# Patient Record
Sex: Female | Born: 1935 | Race: Black or African American | Hispanic: No | Smoking: Never smoker
Health system: Southern US, Community
[De-identification: ages and names within clinical notes are randomized; demographics above are authoritative.]

## PROBLEM LIST (undated history)

## (undated) DIAGNOSIS — I10 Essential (primary) hypertension: Secondary | ICD-10-CM

## (undated) DIAGNOSIS — K219 Gastro-esophageal reflux disease without esophagitis: Secondary | ICD-10-CM

## (undated) DIAGNOSIS — N186 End stage renal disease: Secondary | ICD-10-CM

## (undated) DIAGNOSIS — E119 Type 2 diabetes mellitus without complications: Secondary | ICD-10-CM

## (undated) DIAGNOSIS — E039 Hypothyroidism, unspecified: Secondary | ICD-10-CM

## (undated) DIAGNOSIS — J449 Chronic obstructive pulmonary disease, unspecified: Secondary | ICD-10-CM

## (undated) DIAGNOSIS — K5900 Constipation, unspecified: Secondary | ICD-10-CM

## (undated) DIAGNOSIS — G8929 Other chronic pain: Secondary | ICD-10-CM

## (undated) DIAGNOSIS — Z992 Dependence on renal dialysis: Secondary | ICD-10-CM

## (undated) DIAGNOSIS — N200 Calculus of kidney: Secondary | ICD-10-CM

## (undated) DIAGNOSIS — I2699 Other pulmonary embolism without acute cor pulmonale: Secondary | ICD-10-CM

## (undated) DIAGNOSIS — J45909 Unspecified asthma, uncomplicated: Secondary | ICD-10-CM

## (undated) DIAGNOSIS — G629 Polyneuropathy, unspecified: Secondary | ICD-10-CM

## (undated) DIAGNOSIS — Z9289 Personal history of other medical treatment: Secondary | ICD-10-CM

## (undated) DIAGNOSIS — M545 Low back pain, unspecified: Secondary | ICD-10-CM

## (undated) DIAGNOSIS — D649 Anemia, unspecified: Secondary | ICD-10-CM

## (undated) DIAGNOSIS — E7800 Pure hypercholesterolemia, unspecified: Secondary | ICD-10-CM

## (undated) DIAGNOSIS — M109 Gout, unspecified: Secondary | ICD-10-CM

## (undated) DIAGNOSIS — M1990 Unspecified osteoarthritis, unspecified site: Secondary | ICD-10-CM

## (undated) DIAGNOSIS — C9000 Multiple myeloma not having achieved remission: Secondary | ICD-10-CM

## (undated) DIAGNOSIS — E1140 Type 2 diabetes mellitus with diabetic neuropathy, unspecified: Secondary | ICD-10-CM

## (undated) HISTORY — PX: ABDOMINOPLASTY: SUR9

## (undated) HISTORY — PX: COLONOSCOPY: SHX174

## (undated) HISTORY — PX: HERNIA REPAIR: SHX51

## (undated) HISTORY — PX: UMBILICAL HERNIA REPAIR: SUR1181

## (undated) HISTORY — PX: CHOLECYSTECTOMY: SHX55

---
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
## 1962-06-04 HISTORY — PX: TUBAL LIGATION: SHX77

## 1962-06-04 HISTORY — PX: ABDOMINAL HYSTERECTOMY: SHX81

## 1979-02-03 HISTORY — PX: CYSTOSCOPY W/ STONE MANIPULATION: SHX1427

## 1985-06-04 DIAGNOSIS — I2699 Other pulmonary embolism without acute cor pulmonale: Secondary | ICD-10-CM

## 1985-06-04 HISTORY — PX: LUNG REMOVAL, PARTIAL: SHX233

## 1985-06-04 HISTORY — DX: Other pulmonary embolism without acute cor pulmonale: I26.99

## 1997-11-22 ENCOUNTER — Ambulatory Visit (HOSPITAL_COMMUNITY): Admission: RE | Admit: 1997-11-22 | Discharge: 1997-11-22 | Payer: Self-pay | Admitting: Pulmonary Disease

## 1998-03-15 ENCOUNTER — Ambulatory Visit (HOSPITAL_COMMUNITY): Admission: RE | Admit: 1998-03-15 | Discharge: 1998-03-15 | Payer: Self-pay | Admitting: Cardiology

## 1998-12-23 ENCOUNTER — Encounter: Payer: Self-pay | Admitting: Emergency Medicine

## 1998-12-23 ENCOUNTER — Emergency Department (HOSPITAL_COMMUNITY): Admission: EM | Admit: 1998-12-23 | Discharge: 1998-12-23 | Payer: Self-pay | Admitting: Emergency Medicine

## 1999-06-14 ENCOUNTER — Encounter: Payer: Self-pay | Admitting: Pulmonary Disease

## 1999-06-14 ENCOUNTER — Ambulatory Visit (HOSPITAL_COMMUNITY): Admission: RE | Admit: 1999-06-14 | Discharge: 1999-06-14 | Payer: Self-pay | Admitting: Pulmonary Disease

## 1999-11-04 ENCOUNTER — Emergency Department (HOSPITAL_COMMUNITY): Admission: EM | Admit: 1999-11-04 | Discharge: 1999-11-04 | Payer: Self-pay | Admitting: Internal Medicine

## 1999-11-29 ENCOUNTER — Ambulatory Visit (HOSPITAL_COMMUNITY): Admission: RE | Admit: 1999-11-29 | Discharge: 1999-11-29 | Payer: Self-pay | Admitting: Pulmonary Disease

## 1999-11-29 ENCOUNTER — Encounter: Payer: Self-pay | Admitting: Pulmonary Disease

## 2000-10-08 ENCOUNTER — Encounter: Payer: Self-pay | Admitting: Pulmonary Disease

## 2000-10-08 ENCOUNTER — Encounter: Admission: RE | Admit: 2000-10-08 | Discharge: 2000-10-08 | Payer: Self-pay | Admitting: Pulmonary Disease

## 2001-02-10 ENCOUNTER — Encounter: Payer: Self-pay | Admitting: Orthopaedic Surgery

## 2001-02-10 ENCOUNTER — Encounter: Admission: RE | Admit: 2001-02-10 | Discharge: 2001-02-10 | Payer: Self-pay | Admitting: Orthopaedic Surgery

## 2001-06-10 ENCOUNTER — Emergency Department (HOSPITAL_COMMUNITY): Admission: EM | Admit: 2001-06-10 | Discharge: 2001-06-10 | Payer: Self-pay | Admitting: Emergency Medicine

## 2001-07-15 ENCOUNTER — Ambulatory Visit (HOSPITAL_BASED_OUTPATIENT_CLINIC_OR_DEPARTMENT_OTHER): Admission: RE | Admit: 2001-07-15 | Discharge: 2001-07-16 | Payer: Self-pay | Admitting: Orthopaedic Surgery

## 2001-08-05 ENCOUNTER — Encounter: Admission: RE | Admit: 2001-08-05 | Discharge: 2001-11-03 | Payer: Self-pay | Admitting: Orthopaedic Surgery

## 2001-09-02 ENCOUNTER — Ambulatory Visit (HOSPITAL_BASED_OUTPATIENT_CLINIC_OR_DEPARTMENT_OTHER): Admission: RE | Admit: 2001-09-02 | Discharge: 2001-09-02 | Payer: Self-pay | Admitting: Orthopaedic Surgery

## 2001-10-24 ENCOUNTER — Encounter: Payer: Self-pay | Admitting: Pulmonary Disease

## 2001-10-24 ENCOUNTER — Ambulatory Visit (HOSPITAL_COMMUNITY): Admission: RE | Admit: 2001-10-24 | Discharge: 2001-10-24 | Payer: Self-pay | Admitting: Pulmonary Disease

## 2001-12-12 ENCOUNTER — Encounter: Admission: RE | Admit: 2001-12-12 | Discharge: 2002-01-05 | Payer: Self-pay | Admitting: Orthopaedic Surgery

## 2002-02-19 ENCOUNTER — Encounter: Payer: Self-pay | Admitting: Pulmonary Disease

## 2002-02-19 ENCOUNTER — Ambulatory Visit (HOSPITAL_COMMUNITY): Admission: RE | Admit: 2002-02-19 | Discharge: 2002-02-19 | Payer: Self-pay | Admitting: Pulmonary Disease

## 2002-03-05 ENCOUNTER — Encounter: Payer: Self-pay | Admitting: Pulmonary Disease

## 2002-03-05 ENCOUNTER — Encounter: Admission: RE | Admit: 2002-03-05 | Discharge: 2002-03-05 | Payer: Self-pay | Admitting: Pulmonary Disease

## 2002-03-19 ENCOUNTER — Encounter: Admission: RE | Admit: 2002-03-19 | Discharge: 2002-03-19 | Payer: Self-pay | Admitting: Pulmonary Disease

## 2002-03-19 ENCOUNTER — Encounter: Payer: Self-pay | Admitting: Pulmonary Disease

## 2002-04-02 ENCOUNTER — Encounter: Payer: Self-pay | Admitting: Pulmonary Disease

## 2002-04-02 ENCOUNTER — Encounter: Admission: RE | Admit: 2002-04-02 | Discharge: 2002-04-02 | Payer: Self-pay | Admitting: Pulmonary Disease

## 2002-05-19 ENCOUNTER — Emergency Department (HOSPITAL_COMMUNITY): Admission: EM | Admit: 2002-05-19 | Discharge: 2002-05-19 | Payer: Self-pay

## 2002-06-01 ENCOUNTER — Emergency Department (HOSPITAL_COMMUNITY): Admission: EM | Admit: 2002-06-01 | Discharge: 2002-06-01 | Payer: Self-pay | Admitting: Emergency Medicine

## 2002-08-20 ENCOUNTER — Emergency Department (HOSPITAL_COMMUNITY): Admission: EM | Admit: 2002-08-20 | Discharge: 2002-08-20 | Payer: Self-pay | Admitting: Emergency Medicine

## 2003-03-18 ENCOUNTER — Inpatient Hospital Stay (HOSPITAL_COMMUNITY): Admission: EM | Admit: 2003-03-18 | Discharge: 2003-03-19 | Payer: Self-pay | Admitting: Cardiology

## 2003-03-18 ENCOUNTER — Encounter: Payer: Self-pay | Admitting: Emergency Medicine

## 2003-05-31 ENCOUNTER — Ambulatory Visit (HOSPITAL_COMMUNITY): Admission: RE | Admit: 2003-05-31 | Discharge: 2003-05-31 | Payer: Self-pay | Admitting: Pulmonary Disease

## 2003-08-24 ENCOUNTER — Ambulatory Visit (HOSPITAL_COMMUNITY): Admission: RE | Admit: 2003-08-24 | Discharge: 2003-08-24 | Payer: Self-pay | Admitting: *Deleted

## 2003-08-24 ENCOUNTER — Encounter (INDEPENDENT_AMBULATORY_CARE_PROVIDER_SITE_OTHER): Payer: Self-pay | Admitting: *Deleted

## 2003-09-07 ENCOUNTER — Ambulatory Visit (HOSPITAL_COMMUNITY): Admission: RE | Admit: 2003-09-07 | Discharge: 2003-09-07 | Payer: Self-pay | Admitting: *Deleted

## 2003-10-18 ENCOUNTER — Ambulatory Visit (HOSPITAL_COMMUNITY): Admission: RE | Admit: 2003-10-18 | Discharge: 2003-10-18 | Payer: Self-pay | Admitting: *Deleted

## 2003-10-18 ENCOUNTER — Encounter (INDEPENDENT_AMBULATORY_CARE_PROVIDER_SITE_OTHER): Payer: Self-pay | Admitting: Specialist

## 2004-01-22 ENCOUNTER — Encounter: Admission: RE | Admit: 2004-01-22 | Discharge: 2004-01-22 | Payer: Self-pay | Admitting: Orthopaedic Surgery

## 2004-03-09 ENCOUNTER — Inpatient Hospital Stay (HOSPITAL_COMMUNITY): Admission: RE | Admit: 2004-03-09 | Discharge: 2004-03-10 | Payer: Self-pay | Admitting: Neurosurgery

## 2004-04-05 ENCOUNTER — Encounter: Admission: RE | Admit: 2004-04-05 | Discharge: 2004-04-05 | Payer: Self-pay | Admitting: Neurosurgery

## 2004-07-20 ENCOUNTER — Emergency Department (HOSPITAL_COMMUNITY): Admission: EM | Admit: 2004-07-20 | Discharge: 2004-07-20 | Payer: Self-pay | Admitting: Emergency Medicine

## 2004-07-31 ENCOUNTER — Ambulatory Visit (HOSPITAL_COMMUNITY): Admission: RE | Admit: 2004-07-31 | Discharge: 2004-07-31 | Payer: Self-pay | Admitting: Neurosurgery

## 2004-08-11 ENCOUNTER — Encounter: Admission: RE | Admit: 2004-08-11 | Discharge: 2004-11-09 | Payer: Self-pay | Admitting: Neurosurgery

## 2004-11-10 ENCOUNTER — Ambulatory Visit (HOSPITAL_COMMUNITY): Admission: RE | Admit: 2004-11-10 | Discharge: 2004-11-10 | Payer: Self-pay | Admitting: Pulmonary Disease

## 2005-02-01 ENCOUNTER — Emergency Department (HOSPITAL_COMMUNITY): Admission: EM | Admit: 2005-02-01 | Discharge: 2005-02-01 | Payer: Self-pay | Admitting: Emergency Medicine

## 2005-04-16 ENCOUNTER — Ambulatory Visit (HOSPITAL_COMMUNITY): Admission: RE | Admit: 2005-04-16 | Discharge: 2005-04-16 | Payer: Self-pay | Admitting: Pulmonary Disease

## 2005-09-21 ENCOUNTER — Emergency Department (HOSPITAL_COMMUNITY): Admission: EM | Admit: 2005-09-21 | Discharge: 2005-09-21 | Payer: Self-pay | Admitting: Emergency Medicine

## 2005-10-03 ENCOUNTER — Encounter: Payer: Self-pay | Admitting: Cardiology

## 2006-03-13 ENCOUNTER — Encounter: Admission: RE | Admit: 2006-03-13 | Discharge: 2006-03-13 | Payer: Self-pay | Admitting: Orthopaedic Surgery

## 2006-03-28 ENCOUNTER — Ambulatory Visit (HOSPITAL_BASED_OUTPATIENT_CLINIC_OR_DEPARTMENT_OTHER): Admission: RE | Admit: 2006-03-28 | Discharge: 2006-03-29 | Payer: Self-pay | Admitting: Orthopaedic Surgery

## 2006-04-04 ENCOUNTER — Encounter: Admission: RE | Admit: 2006-04-04 | Discharge: 2006-05-01 | Payer: Self-pay | Admitting: Orthopaedic Surgery

## 2006-05-02 ENCOUNTER — Encounter: Admission: RE | Admit: 2006-05-02 | Discharge: 2006-07-31 | Payer: Self-pay | Admitting: Orthopaedic Surgery

## 2006-08-09 ENCOUNTER — Ambulatory Visit (HOSPITAL_COMMUNITY): Admission: RE | Admit: 2006-08-09 | Discharge: 2006-08-09 | Payer: Self-pay | Admitting: Pulmonary Disease

## 2006-12-18 ENCOUNTER — Encounter: Admission: RE | Admit: 2006-12-18 | Discharge: 2006-12-18 | Payer: Self-pay | Admitting: Orthopaedic Surgery

## 2007-01-01 ENCOUNTER — Encounter: Admission: RE | Admit: 2007-01-01 | Discharge: 2007-01-01 | Payer: Self-pay | Admitting: Orthopaedic Surgery

## 2007-06-30 ENCOUNTER — Ambulatory Visit (HOSPITAL_COMMUNITY): Admission: RE | Admit: 2007-06-30 | Discharge: 2007-06-30 | Payer: Self-pay | Admitting: Pulmonary Disease

## 2007-08-19 ENCOUNTER — Emergency Department (HOSPITAL_COMMUNITY): Admission: EM | Admit: 2007-08-19 | Discharge: 2007-08-19 | Payer: Self-pay | Admitting: Emergency Medicine

## 2008-03-22 ENCOUNTER — Emergency Department (HOSPITAL_COMMUNITY): Admission: EM | Admit: 2008-03-22 | Discharge: 2008-03-22 | Payer: Self-pay | Admitting: Emergency Medicine

## 2008-06-02 ENCOUNTER — Inpatient Hospital Stay (HOSPITAL_COMMUNITY): Admission: AD | Admit: 2008-06-02 | Discharge: 2008-06-02 | Payer: Self-pay | Admitting: Obstetrics & Gynecology

## 2009-06-04 HISTORY — PX: AV FISTULA PLACEMENT: SHX1204

## 2009-07-11 ENCOUNTER — Inpatient Hospital Stay (HOSPITAL_COMMUNITY): Admission: EM | Admit: 2009-07-11 | Discharge: 2009-07-23 | Payer: Self-pay | Admitting: Emergency Medicine

## 2009-07-11 ENCOUNTER — Emergency Department (HOSPITAL_COMMUNITY): Admission: EM | Admit: 2009-07-11 | Discharge: 2009-07-11 | Payer: Self-pay | Admitting: Family Medicine

## 2009-07-15 ENCOUNTER — Ambulatory Visit: Payer: Self-pay | Admitting: Vascular Surgery

## 2009-07-15 ENCOUNTER — Encounter: Payer: Self-pay | Admitting: Internal Medicine

## 2009-08-01 ENCOUNTER — Encounter (HOSPITAL_COMMUNITY): Admission: RE | Admit: 2009-08-01 | Discharge: 2009-10-30 | Payer: Self-pay | Admitting: Nephrology

## 2009-10-20 ENCOUNTER — Encounter: Admission: RE | Admit: 2009-10-20 | Discharge: 2009-10-20 | Payer: Self-pay | Admitting: Nephrology

## 2009-10-25 ENCOUNTER — Ambulatory Visit: Payer: Self-pay | Admitting: Vascular Surgery

## 2009-11-08 ENCOUNTER — Encounter (HOSPITAL_COMMUNITY): Admission: RE | Admit: 2009-11-08 | Discharge: 2010-02-06 | Payer: Self-pay | Admitting: Nephrology

## 2010-02-15 ENCOUNTER — Encounter (HOSPITAL_COMMUNITY)
Admission: RE | Admit: 2010-02-15 | Discharge: 2010-05-16 | Payer: Self-pay | Source: Home / Self Care | Attending: Nephrology | Admitting: Nephrology

## 2010-05-24 ENCOUNTER — Encounter (HOSPITAL_COMMUNITY)
Admission: RE | Admit: 2010-05-24 | Discharge: 2010-07-04 | Payer: Self-pay | Source: Home / Self Care | Attending: Nephrology | Admitting: Nephrology

## 2010-06-07 LAB — POCT HEMOGLOBIN-HEMACUE: Hemoglobin: 12.6 g/dL (ref 12.0–15.0)

## 2010-06-24 ENCOUNTER — Encounter: Payer: Self-pay | Admitting: Pulmonary Disease

## 2010-06-25 ENCOUNTER — Encounter: Payer: Self-pay | Admitting: Orthopaedic Surgery

## 2010-06-26 LAB — RENAL FUNCTION PANEL
Albumin: 3.4 g/dL — ABNORMAL LOW (ref 3.5–5.2)
BUN: 71 mg/dL — ABNORMAL HIGH (ref 6–23)
CO2: 25 mEq/L (ref 19–32)
GFR calc non Af Amer: 11 mL/min — ABNORMAL LOW (ref >60)
Phosphorus: 5.2 mg/dL — ABNORMAL HIGH (ref 2.3–4.6)

## 2010-06-26 LAB — POCT HEMOGLOBIN-HEMACUE: Hemoglobin: 11 g/dL — ABNORMAL LOW (ref 12.0–15.0)

## 2010-07-05 ENCOUNTER — Other Ambulatory Visit: Payer: Self-pay

## 2010-07-05 ENCOUNTER — Encounter (HOSPITAL_COMMUNITY): Payer: MEDICARE | Attending: Nephrology

## 2010-07-05 DIAGNOSIS — N186 End stage renal disease: Secondary | ICD-10-CM | POA: Insufficient documentation

## 2010-07-05 DIAGNOSIS — D649 Anemia, unspecified: Secondary | ICD-10-CM | POA: Insufficient documentation

## 2010-07-05 DIAGNOSIS — I120 Hypertensive chronic kidney disease with stage 5 chronic kidney disease or end stage renal disease: Secondary | ICD-10-CM | POA: Insufficient documentation

## 2010-07-19 ENCOUNTER — Other Ambulatory Visit: Payer: Self-pay

## 2010-07-19 ENCOUNTER — Encounter (HOSPITAL_COMMUNITY): Payer: MEDICARE

## 2010-07-19 ENCOUNTER — Other Ambulatory Visit: Payer: Self-pay | Admitting: Nephrology

## 2010-07-19 LAB — RENAL FUNCTION PANEL
Albumin: 3.5 g/dL (ref 3.5–5.2)
BUN: 76 mg/dL — ABNORMAL HIGH (ref 6–23)
CO2: 26 meq/L (ref 19–32)
Calcium: 8.9 mg/dL (ref 8.4–10.5)
Chloride: 106 meq/L (ref 96–112)
Creatinine, Ser: 3.47 mg/dL — ABNORMAL HIGH (ref 0.4–1.2)
GFR calc non Af Amer: 13 mL/min — ABNORMAL LOW
Glucose, Bld: 186 mg/dL — ABNORMAL HIGH (ref 70–99)
Phosphorus: 4.7 mg/dL — ABNORMAL HIGH (ref 2.3–4.6)
Potassium: 4.2 meq/L (ref 3.5–5.1)
Sodium: 141 meq/L (ref 135–145)

## 2010-07-21 LAB — POCT HEMOGLOBIN-HEMACUE: Hemoglobin: 11.6 g/dL — ABNORMAL LOW (ref 12.0–15.0)

## 2010-07-24 LAB — POCT HEMOGLOBIN-HEMACUE: Hemoglobin: 12.4 g/dL (ref 12.0–15.0)

## 2010-08-02 ENCOUNTER — Encounter (HOSPITAL_COMMUNITY): Payer: MEDICARE

## 2010-08-02 ENCOUNTER — Other Ambulatory Visit: Payer: Self-pay

## 2010-08-02 LAB — POCT HEMOGLOBIN-HEMACUE: Hemoglobin: 12.9 g/dL (ref 12.0–15.0)

## 2010-08-14 LAB — RENAL FUNCTION PANEL
Albumin: 3.6 g/dL (ref 3.5–5.2)
BUN: 77 mg/dL — ABNORMAL HIGH (ref 6–23)
Calcium: 9.4 mg/dL (ref 8.4–10.5)
Chloride: 104 mEq/L (ref 96–112)
Creatinine, Ser: 3.76 mg/dL — ABNORMAL HIGH (ref 0.4–1.2)
GFR calc Af Amer: 14 mL/min — ABNORMAL LOW (ref >60)

## 2010-08-14 LAB — POCT HEMOGLOBIN-HEMACUE: Hemoglobin: 11.4 g/dL — ABNORMAL LOW (ref 12.0–15.0)

## 2010-08-16 ENCOUNTER — Encounter (HOSPITAL_COMMUNITY): Payer: Medicare Other | Attending: Nephrology

## 2010-08-16 ENCOUNTER — Other Ambulatory Visit: Payer: Self-pay

## 2010-08-16 ENCOUNTER — Other Ambulatory Visit: Payer: Self-pay | Admitting: Nephrology

## 2010-08-16 DIAGNOSIS — I120 Hypertensive chronic kidney disease with stage 5 chronic kidney disease or end stage renal disease: Secondary | ICD-10-CM | POA: Insufficient documentation

## 2010-08-16 DIAGNOSIS — D649 Anemia, unspecified: Secondary | ICD-10-CM | POA: Insufficient documentation

## 2010-08-16 DIAGNOSIS — N186 End stage renal disease: Secondary | ICD-10-CM | POA: Insufficient documentation

## 2010-08-16 LAB — RENAL FUNCTION PANEL
Albumin: 3.5 g/dL (ref 3.5–5.2)
BUN: 67 mg/dL — ABNORMAL HIGH (ref 6–23)
BUN: 74 mg/dL — ABNORMAL HIGH (ref 6–23)
Calcium: 9.3 mg/dL (ref 8.4–10.5)
Chloride: 111 mEq/L (ref 96–112)
Glucose, Bld: 130 mg/dL — ABNORMAL HIGH (ref 70–99)
Glucose, Bld: 254 mg/dL — ABNORMAL HIGH (ref 70–99)
Phosphorus: 5.1 mg/dL — ABNORMAL HIGH (ref 2.3–4.6)
Potassium: 4.3 mEq/L (ref 3.5–5.1)
Sodium: 144 mEq/L (ref 135–145)

## 2010-08-16 LAB — POCT HEMOGLOBIN-HEMACUE: Hemoglobin: 11.8 g/dL — ABNORMAL LOW (ref 12.0–15.0)

## 2010-08-17 LAB — RENAL FUNCTION PANEL
Albumin: 3.6 g/dL (ref 3.5–5.2)
CO2: 27 mEq/L (ref 19–32)
CO2: 27 mEq/L (ref 19–32)
Calcium: 9.2 mg/dL (ref 8.4–10.5)
Calcium: 9 mg/dL (ref 8.4–10.5)
Chloride: 105 mEq/L (ref 96–112)
Creatinine, Ser: 3.71 mg/dL — ABNORMAL HIGH (ref 0.4–1.2)
GFR calc Af Amer: 15 mL/min — ABNORMAL LOW (ref >60)
GFR calc non Af Amer: 12 mL/min — ABNORMAL LOW (ref >60)
Glucose, Bld: 159 mg/dL — ABNORMAL HIGH (ref 70–99)
Phosphorus: 4.7 mg/dL — ABNORMAL HIGH (ref 2.3–4.6)
Sodium: 141 mEq/L (ref 135–145)
Sodium: 137 mEq/L (ref 135–145)

## 2010-08-17 LAB — POCT HEMOGLOBIN-HEMACUE: Hemoglobin: 12.3 g/dL (ref 12.0–15.0)

## 2010-08-18 LAB — COMPREHENSIVE METABOLIC PANEL
ALT: 17 U/L (ref 0–35)
AST: 29 U/L (ref 0–37)
Alkaline Phosphatase: 51 U/L (ref 39–117)
CO2: 23 mEq/L (ref 19–32)
Calcium: 9.1 mg/dL (ref 8.4–10.5)
Chloride: 106 mEq/L (ref 96–112)
GFR calc Af Amer: 14 mL/min — ABNORMAL LOW (ref >60)
GFR calc non Af Amer: 11 mL/min — ABNORMAL LOW (ref >60)
Glucose, Bld: 221 mg/dL — ABNORMAL HIGH (ref 70–99)
Potassium: 4.4 mEq/L (ref 3.5–5.1)
Sodium: 139 mEq/L (ref 135–145)
Total Bilirubin: 0.7 mg/dL (ref 0.3–1.2)

## 2010-08-18 LAB — RENAL FUNCTION PANEL
BUN: 71 mg/dL — ABNORMAL HIGH (ref 6–23)
CO2: 29 mEq/L (ref 19–32)
Chloride: 103 mEq/L (ref 96–112)
Glucose, Bld: 190 mg/dL — ABNORMAL HIGH (ref 70–99)
Phosphorus: 4.6 mg/dL (ref 2.3–4.6)
Potassium: 4.7 mEq/L (ref 3.5–5.1)
Sodium: 140 mEq/L (ref 135–145)

## 2010-08-18 LAB — URIC ACID: Uric Acid, Serum: 6 mg/dL (ref 2.4–7.0)

## 2010-08-18 LAB — POCT HEMOGLOBIN-HEMACUE: Hemoglobin: 11.2 g/dL — ABNORMAL LOW (ref 12.0–15.0)

## 2010-08-20 LAB — POCT HEMOGLOBIN-HEMACUE: Hemoglobin: 11.7 g/dL — ABNORMAL LOW (ref 12.0–15.0)

## 2010-08-21 LAB — RENAL FUNCTION PANEL
CO2: 18 mEq/L — ABNORMAL LOW (ref 19–32)
Calcium: 8.9 mg/dL (ref 8.4–10.5)
Chloride: 112 mEq/L (ref 96–112)
Creatinine, Ser: 4.05 mg/dL — ABNORMAL HIGH (ref 0.4–1.2)
GFR calc Af Amer: 13 mL/min — ABNORMAL LOW (ref >60)
GFR calc non Af Amer: 11 mL/min — ABNORMAL LOW (ref >60)
Glucose, Bld: 156 mg/dL — ABNORMAL HIGH (ref 70–99)
Sodium: 141 mEq/L (ref 135–145)

## 2010-08-21 LAB — CBC
Hemoglobin: 12.8 g/dL (ref 12.0–15.0)
MCHC: 33.7 g/dL (ref 30.0–36.0)
MCV: 95.2 fL (ref 78.0–100.0)
RBC: 4 MIL/uL (ref 3.87–5.11)
WBC: 6.3 10*3/uL (ref 4.0–10.5)

## 2010-08-22 LAB — RENAL FUNCTION PANEL
BUN: 74 mg/dL — ABNORMAL HIGH (ref 6–23)
CO2: 22 mEq/L (ref 19–32)
Glucose, Bld: 216 mg/dL — ABNORMAL HIGH (ref 70–99)
Potassium: 4.6 mEq/L (ref 3.5–5.1)
Sodium: 140 mEq/L (ref 135–145)

## 2010-08-22 LAB — POCT HEMOGLOBIN-HEMACUE: Hemoglobin: 12.6 g/dL (ref 12.0–15.0)

## 2010-08-22 LAB — CBC
HCT: 35.1 % — ABNORMAL LOW (ref 36.0–46.0)
Hemoglobin: 12 g/dL (ref 12.0–15.0)
RBC: 3.66 MIL/uL — ABNORMAL LOW (ref 3.87–5.11)

## 2010-08-23 LAB — POCT HEMOGLOBIN-HEMACUE: Hemoglobin: 10.7 g/dL — ABNORMAL LOW (ref 12.0–15.0)

## 2010-08-25 LAB — RENAL FUNCTION PANEL
Albumin: 3.1 g/dL — ABNORMAL LOW (ref 3.5–5.2)
Albumin: 3.2 g/dL — ABNORMAL LOW (ref 3.5–5.2)
Albumin: 3.3 g/dL — ABNORMAL LOW (ref 3.5–5.2)
Albumin: 3.3 g/dL — ABNORMAL LOW (ref 3.5–5.2)
Albumin: 3.3 g/dL — ABNORMAL LOW (ref 3.5–5.2)
Albumin: 3.4 g/dL — ABNORMAL LOW (ref 3.5–5.2)
Albumin: 3.5 g/dL (ref 3.5–5.2)
BUN: 103 mg/dL — ABNORMAL HIGH (ref 6–23)
BUN: 116 mg/dL — ABNORMAL HIGH (ref 6–23)
BUN: 54 mg/dL — ABNORMAL HIGH (ref 6–23)
BUN: 55 mg/dL — ABNORMAL HIGH (ref 6–23)
BUN: 58 mg/dL — ABNORMAL HIGH (ref 6–23)
BUN: 59 mg/dL — ABNORMAL HIGH (ref 6–23)
BUN: 62 mg/dL — ABNORMAL HIGH (ref 6–23)
BUN: 62 mg/dL — ABNORMAL HIGH (ref 6–23)
BUN: 69 mg/dL — ABNORMAL HIGH (ref 6–23)
BUN: 74 mg/dL — ABNORMAL HIGH (ref 6–23)
CO2: 20 mEq/L (ref 19–32)
CO2: 22 mEq/L (ref 19–32)
CO2: 24 mEq/L (ref 19–32)
CO2: 24 mEq/L (ref 19–32)
Calcium: 8.5 mg/dL (ref 8.4–10.5)
Calcium: 8.7 mg/dL (ref 8.4–10.5)
Calcium: 8.7 mg/dL (ref 8.4–10.5)
Calcium: 8.7 mg/dL (ref 8.4–10.5)
Calcium: 8.7 mg/dL (ref 8.4–10.5)
Calcium: 8.8 mg/dL (ref 8.4–10.5)
Calcium: 8.8 mg/dL (ref 8.4–10.5)
Chloride: 107 mEq/L (ref 96–112)
Chloride: 109 mEq/L (ref 96–112)
Chloride: 111 mEq/L (ref 96–112)
Chloride: 112 mEq/L (ref 96–112)
Chloride: 112 mEq/L (ref 96–112)
Chloride: 113 mEq/L — ABNORMAL HIGH (ref 96–112)
Creatinine, Ser: 3.96 mg/dL — ABNORMAL HIGH (ref 0.4–1.2)
Creatinine, Ser: 3.99 mg/dL — ABNORMAL HIGH (ref 0.4–1.2)
Creatinine, Ser: 4.04 mg/dL — ABNORMAL HIGH (ref 0.4–1.2)
Creatinine, Ser: 4.24 mg/dL — ABNORMAL HIGH (ref 0.4–1.2)
Creatinine, Ser: 4.33 mg/dL — ABNORMAL HIGH (ref 0.4–1.2)
Creatinine, Ser: 4.38 mg/dL — ABNORMAL HIGH (ref 0.4–1.2)
Creatinine, Ser: 4.93 mg/dL — ABNORMAL HIGH (ref 0.4–1.2)
GFR calc Af Amer: 12 mL/min — ABNORMAL LOW (ref >60)
GFR calc non Af Amer: 10 mL/min — ABNORMAL LOW (ref >60)
Glucose, Bld: 100 mg/dL — ABNORMAL HIGH (ref 70–99)
Glucose, Bld: 104 mg/dL — ABNORMAL HIGH (ref 70–99)
Glucose, Bld: 107 mg/dL — ABNORMAL HIGH (ref 70–99)
Glucose, Bld: 119 mg/dL — ABNORMAL HIGH (ref 70–99)
Glucose, Bld: 85 mg/dL (ref 70–99)
Glucose, Bld: 86 mg/dL (ref 70–99)
Glucose, Bld: 88 mg/dL (ref 70–99)
Glucose, Bld: 93 mg/dL (ref 70–99)
Phosphorus: 2.9 mg/dL (ref 2.3–4.6)
Phosphorus: 3 mg/dL (ref 2.3–4.6)
Phosphorus: 4.2 mg/dL (ref 2.3–4.6)
Phosphorus: 4.5 mg/dL (ref 2.3–4.6)
Phosphorus: 4.5 mg/dL (ref 2.3–4.6)
Potassium: 4.1 mEq/L (ref 3.5–5.1)
Potassium: 4.2 mEq/L (ref 3.5–5.1)
Potassium: 4.6 mEq/L (ref 3.5–5.1)
Potassium: 4.9 mEq/L (ref 3.5–5.1)
Sodium: 138 mEq/L (ref 135–145)
Sodium: 138 mEq/L (ref 135–145)
Sodium: 140 mEq/L (ref 135–145)
Sodium: 140 mEq/L (ref 135–145)

## 2010-08-25 LAB — CBC
HCT: 21.1 % — ABNORMAL LOW (ref 36.0–46.0)
HCT: 21.2 % — ABNORMAL LOW (ref 36.0–46.0)
HCT: 21.3 % — ABNORMAL LOW (ref 36.0–46.0)
HCT: 21.4 % — ABNORMAL LOW (ref 36.0–46.0)
HCT: 21.4 % — ABNORMAL LOW (ref 36.0–46.0)
HCT: 26.2 % — ABNORMAL LOW (ref 36.0–46.0)
HCT: 26.3 % — ABNORMAL LOW (ref 36.0–46.0)
Hemoglobin: 7 g/dL — ABNORMAL LOW (ref 12.0–15.0)
Hemoglobin: 7.1 g/dL — ABNORMAL LOW (ref 12.0–15.0)
Hemoglobin: 7.3 g/dL — ABNORMAL LOW (ref 12.0–15.0)
Hemoglobin: 7.4 g/dL — ABNORMAL LOW (ref 12.0–15.0)
Hemoglobin: 8.6 g/dL — ABNORMAL LOW (ref 12.0–15.0)
Hemoglobin: 9.1 g/dL — ABNORMAL LOW (ref 12.0–15.0)
MCHC: 33.5 g/dL (ref 30.0–36.0)
MCHC: 34.1 g/dL (ref 30.0–36.0)
MCHC: 34.3 g/dL (ref 30.0–36.0)
MCHC: 34.4 g/dL (ref 30.0–36.0)
MCHC: 34.4 g/dL (ref 30.0–36.0)
MCHC: 34.4 g/dL (ref 30.0–36.0)
MCHC: 34.6 g/dL (ref 30.0–36.0)
MCHC: 35.1 g/dL (ref 30.0–36.0)
MCV: 90.7 fL (ref 78.0–100.0)
MCV: 91.1 fL (ref 78.0–100.0)
MCV: 93.9 fL (ref 78.0–100.0)
MCV: 94.7 fL (ref 78.0–100.0)
MCV: 95.3 fL (ref 78.0–100.0)
MCV: 96.4 fL (ref 78.0–100.0)
MCV: 97.7 fL (ref 78.0–100.0)
MCV: 98.4 fL (ref 78.0–100.0)
Platelets: 202 10*3/uL (ref 150–400)
Platelets: 209 10*3/uL (ref 150–400)
Platelets: 221 10*3/uL (ref 150–400)
Platelets: 226 10*3/uL (ref 150–400)
Platelets: 243 10*3/uL (ref 150–400)
RBC: 2.07 MIL/uL — ABNORMAL LOW (ref 3.87–5.11)
RBC: 2.08 MIL/uL — ABNORMAL LOW (ref 3.87–5.11)
RBC: 2.16 MIL/uL — ABNORMAL LOW (ref 3.87–5.11)
RBC: 2.18 MIL/uL — ABNORMAL LOW (ref 3.87–5.11)
RBC: 2.24 MIL/uL — ABNORMAL LOW (ref 3.87–5.11)
RBC: 2.4 MIL/uL — ABNORMAL LOW (ref 3.87–5.11)
RBC: 2.68 MIL/uL — ABNORMAL LOW (ref 3.87–5.11)
RBC: 2.73 MIL/uL — ABNORMAL LOW (ref 3.87–5.11)
RBC: 2.88 MIL/uL — ABNORMAL LOW (ref 3.87–5.11)
RDW: 15.1 % (ref 11.5–15.5)
RDW: 15.1 % (ref 11.5–15.5)
RDW: 15.2 % (ref 11.5–15.5)
RDW: 15.8 % — ABNORMAL HIGH (ref 11.5–15.5)
RDW: 15.9 % — ABNORMAL HIGH (ref 11.5–15.5)
RDW: 16.1 % — ABNORMAL HIGH (ref 11.5–15.5)
RDW: 16.3 % — ABNORMAL HIGH (ref 11.5–15.5)
WBC: 4.7 10*3/uL (ref 4.0–10.5)
WBC: 5.2 10*3/uL (ref 4.0–10.5)
WBC: 5.3 10*3/uL (ref 4.0–10.5)
WBC: 5.7 10*3/uL (ref 4.0–10.5)
WBC: 5.8 10*3/uL (ref 4.0–10.5)

## 2010-08-25 LAB — CROSSMATCH
Antibody Screen: POSITIVE
DAT, IgG: NEGATIVE
PT AG Type: NEGATIVE

## 2010-08-25 LAB — URINALYSIS, ROUTINE W REFLEX MICROSCOPIC
Nitrite: NEGATIVE
Protein, ur: NEGATIVE mg/dL
Urobilinogen, UA: 0.2 mg/dL (ref 0.0–1.0)

## 2010-08-25 LAB — DIFFERENTIAL
Basophils Absolute: 0 10*3/uL (ref 0.0–0.1)
Basophils Absolute: 0 10*3/uL (ref 0.0–0.1)
Basophils Absolute: 0 10*3/uL (ref 0.0–0.1)
Basophils Absolute: 0 10*3/uL (ref 0.0–0.1)
Basophils Absolute: 0.1 10*3/uL (ref 0.0–0.1)
Basophils Relative: 0 % (ref 0–1)
Basophils Relative: 0 % (ref 0–1)
Basophils Relative: 0 % (ref 0–1)
Basophils Relative: 0 % (ref 0–1)
Basophils Relative: 1 % (ref 0–1)
Basophils Relative: 1 % (ref 0–1)
Basophils Relative: 1 % (ref 0–1)
Eosinophils Absolute: 0.1 10*3/uL (ref 0.0–0.7)
Eosinophils Absolute: 0.1 10*3/uL (ref 0.0–0.7)
Eosinophils Absolute: 0.1 10*3/uL (ref 0.0–0.7)
Eosinophils Absolute: 0.1 10*3/uL (ref 0.0–0.7)
Eosinophils Absolute: 0.1 10*3/uL (ref 0.0–0.7)
Eosinophils Absolute: 0.1 10*3/uL (ref 0.0–0.7)
Eosinophils Absolute: 0.1 10*3/uL (ref 0.0–0.7)
Eosinophils Absolute: 0.1 10*3/uL (ref 0.0–0.7)
Eosinophils Absolute: 0.2 10*3/uL (ref 0.0–0.7)
Eosinophils Absolute: 0.2 10*3/uL (ref 0.0–0.7)
Eosinophils Relative: 1 % (ref 0–5)
Eosinophils Relative: 2 % (ref 0–5)
Eosinophils Relative: 2 % (ref 0–5)
Eosinophils Relative: 2 % (ref 0–5)
Eosinophils Relative: 2 % (ref 0–5)
Eosinophils Relative: 2 % (ref 0–5)
Lymphocytes Relative: 24 % (ref 12–46)
Lymphocytes Relative: 26 % (ref 12–46)
Lymphocytes Relative: 28 % (ref 12–46)
Lymphocytes Relative: 28 % (ref 12–46)
Lymphocytes Relative: 30 % (ref 12–46)
Lymphocytes Relative: 31 % (ref 12–46)
Lymphs Abs: 1.5 10*3/uL (ref 0.7–4.0)
Lymphs Abs: 1.6 10*3/uL (ref 0.7–4.0)
Lymphs Abs: 1.6 10*3/uL (ref 0.7–4.0)
Lymphs Abs: 1.7 10*3/uL (ref 0.7–4.0)
Monocytes Absolute: 0.3 10*3/uL (ref 0.1–1.0)
Monocytes Absolute: 0.4 10*3/uL (ref 0.1–1.0)
Monocytes Absolute: 0.4 10*3/uL (ref 0.1–1.0)
Monocytes Absolute: 0.4 10*3/uL (ref 0.1–1.0)
Monocytes Absolute: 0.5 10*3/uL (ref 0.1–1.0)
Monocytes Absolute: 0.5 10*3/uL (ref 0.1–1.0)
Monocytes Relative: 5 % (ref 3–12)
Monocytes Relative: 7 % (ref 3–12)
Monocytes Relative: 7 % (ref 3–12)
Monocytes Relative: 8 % (ref 3–12)
Monocytes Relative: 8 % (ref 3–12)
Monocytes Relative: 8 % (ref 3–12)
Monocytes Relative: 8 % (ref 3–12)
Monocytes Relative: 8 % (ref 3–12)
Neutro Abs: 3.2 10*3/uL (ref 1.7–7.7)
Neutro Abs: 4.6 10*3/uL (ref 1.7–7.7)
Neutrophils Relative %: 57 % (ref 43–77)
Neutrophils Relative %: 58 % (ref 43–77)
Neutrophils Relative %: 61 % (ref 43–77)
Neutrophils Relative %: 61 % (ref 43–77)
Neutrophils Relative %: 62 % (ref 43–77)
Neutrophils Relative %: 66 % (ref 43–77)
Neutrophils Relative %: 67 % (ref 43–77)

## 2010-08-25 LAB — GLUCOSE, CAPILLARY
Glucose-Capillary: 100 mg/dL — ABNORMAL HIGH (ref 70–99)
Glucose-Capillary: 101 mg/dL — ABNORMAL HIGH (ref 70–99)
Glucose-Capillary: 103 mg/dL — ABNORMAL HIGH (ref 70–99)
Glucose-Capillary: 103 mg/dL — ABNORMAL HIGH (ref 70–99)
Glucose-Capillary: 108 mg/dL — ABNORMAL HIGH (ref 70–99)
Glucose-Capillary: 111 mg/dL — ABNORMAL HIGH (ref 70–99)
Glucose-Capillary: 112 mg/dL — ABNORMAL HIGH (ref 70–99)
Glucose-Capillary: 113 mg/dL — ABNORMAL HIGH (ref 70–99)
Glucose-Capillary: 113 mg/dL — ABNORMAL HIGH (ref 70–99)
Glucose-Capillary: 114 mg/dL — ABNORMAL HIGH (ref 70–99)
Glucose-Capillary: 121 mg/dL — ABNORMAL HIGH (ref 70–99)
Glucose-Capillary: 129 mg/dL — ABNORMAL HIGH (ref 70–99)
Glucose-Capillary: 131 mg/dL — ABNORMAL HIGH (ref 70–99)
Glucose-Capillary: 131 mg/dL — ABNORMAL HIGH (ref 70–99)
Glucose-Capillary: 137 mg/dL — ABNORMAL HIGH (ref 70–99)
Glucose-Capillary: 139 mg/dL — ABNORMAL HIGH (ref 70–99)
Glucose-Capillary: 142 mg/dL — ABNORMAL HIGH (ref 70–99)
Glucose-Capillary: 169 mg/dL — ABNORMAL HIGH (ref 70–99)
Glucose-Capillary: 189 mg/dL — ABNORMAL HIGH (ref 70–99)
Glucose-Capillary: 191 mg/dL — ABNORMAL HIGH (ref 70–99)
Glucose-Capillary: 204 mg/dL — ABNORMAL HIGH (ref 70–99)
Glucose-Capillary: 91 mg/dL (ref 70–99)
Glucose-Capillary: 92 mg/dL (ref 70–99)
Glucose-Capillary: 95 mg/dL (ref 70–99)

## 2010-08-25 LAB — COMPREHENSIVE METABOLIC PANEL
ALT: 41 U/L — ABNORMAL HIGH (ref 0–35)
Alkaline Phosphatase: 43 U/L (ref 39–117)
CO2: 22 mEq/L (ref 19–32)
Calcium: 8.9 mg/dL (ref 8.4–10.5)
GFR calc non Af Amer: 11 mL/min — ABNORMAL LOW (ref >60)
Glucose, Bld: 116 mg/dL — ABNORMAL HIGH (ref 70–99)
Potassium: 3.9 mEq/L (ref 3.5–5.1)
Sodium: 140 mEq/L (ref 135–145)

## 2010-08-25 LAB — HEMOGLOBIN A1C: Mean Plasma Glucose: 140 mg/dL

## 2010-08-25 LAB — HEPATITIS PANEL, ACUTE
Hep B C IgM: NEGATIVE
Hepatitis B Surface Ag: NEGATIVE

## 2010-08-25 LAB — POCT CARDIAC MARKERS
CKMB, poc: 5 ng/mL (ref 1.0–8.0)
Myoglobin, poc: >500 ng/mL (ref 12–200)
Troponin i, poc: <0.05 ng/mL (ref 0.00–0.09)
Troponin i, poc: <0.05 ng/mL (ref 0.00–0.09)

## 2010-08-25 LAB — LUPUS ANTICOAGULANT PANEL
DRVVT: 48.7 secs — ABNORMAL HIGH (ref 36.2–46.0)
Lupus Anticoagulant: NOT DETECTED
dRVVT Incubated 1:1 Mix: 41 secs (ref 36.1–47.0)

## 2010-08-25 LAB — BASIC METABOLIC PANEL
CO2: 24 mEq/L (ref 19–32)
Chloride: 105 mEq/L (ref 96–112)
GFR calc Af Amer: 10 mL/min — ABNORMAL LOW (ref >60)
Potassium: 4.3 mEq/L (ref 3.5–5.1)
Sodium: 138 mEq/L (ref 135–145)

## 2010-08-25 LAB — FOLATE RBC: RBC Folate: 1494 ng/mL — ABNORMAL HIGH (ref 180–600)

## 2010-08-25 LAB — LIPID PANEL
Total CHOL/HDL Ratio: 3.8 RATIO
VLDL: 23 mg/dL (ref 0–40)

## 2010-08-25 LAB — URINE CULTURE

## 2010-08-25 LAB — GLUCOSE 6 PHOSPHATE DEHYDROGENASE: G-6-PD, Quant: 11 U/g{Hb} (ref 7–20)

## 2010-08-28 LAB — CBC
HCT: 29.7 % — ABNORMAL LOW (ref 36.0–46.0)
MCV: 96.9 fL (ref 78.0–100.0)
RBC: 3.06 MIL/uL — ABNORMAL LOW (ref 3.87–5.11)
WBC: 7.2 10*3/uL (ref 4.0–10.5)

## 2010-08-28 LAB — RENAL FUNCTION PANEL
BUN: 81 mg/dL — ABNORMAL HIGH (ref 6–23)
CO2: 23 mEq/L (ref 19–32)
Chloride: 108 mEq/L (ref 96–112)
Creatinine, Ser: 3.76 mg/dL — ABNORMAL HIGH (ref 0.4–1.2)
GFR calc non Af Amer: 12 mL/min — ABNORMAL LOW (ref >60)

## 2010-08-30 ENCOUNTER — Encounter (HOSPITAL_COMMUNITY): Payer: Medicare Other

## 2010-08-30 ENCOUNTER — Other Ambulatory Visit: Payer: Self-pay | Admitting: Nephrology

## 2010-09-13 ENCOUNTER — Encounter (HOSPITAL_COMMUNITY): Payer: MEDICARE | Attending: Nephrology

## 2010-09-13 ENCOUNTER — Other Ambulatory Visit: Payer: Self-pay | Admitting: Nephrology

## 2010-09-13 DIAGNOSIS — I120 Hypertensive chronic kidney disease with stage 5 chronic kidney disease or end stage renal disease: Secondary | ICD-10-CM | POA: Insufficient documentation

## 2010-09-13 DIAGNOSIS — D649 Anemia, unspecified: Secondary | ICD-10-CM | POA: Insufficient documentation

## 2010-09-13 DIAGNOSIS — N186 End stage renal disease: Secondary | ICD-10-CM | POA: Insufficient documentation

## 2010-09-13 LAB — RENAL FUNCTION PANEL
Albumin: 3.8 g/dL (ref 3.5–5.2)
CO2: 23 mEq/L (ref 19–32)
Calcium: 9.2 mg/dL (ref 8.4–10.5)
Chloride: 109 mEq/L (ref 96–112)
GFR calc Af Amer: 16 mL/min — ABNORMAL LOW (ref >60)
GFR calc non Af Amer: 13 mL/min — ABNORMAL LOW (ref >60)
Sodium: 141 mEq/L (ref 135–145)

## 2010-09-18 ENCOUNTER — Emergency Department (HOSPITAL_COMMUNITY): Payer: MEDICARE

## 2010-09-18 ENCOUNTER — Emergency Department (HOSPITAL_COMMUNITY)
Admission: EM | Admit: 2010-09-18 | Discharge: 2010-09-18 | Disposition: A | Discharge status: Home / Self Care | Payer: MEDICARE | Attending: Emergency Medicine | Admitting: Emergency Medicine

## 2010-09-18 DIAGNOSIS — I120 Hypertensive chronic kidney disease with stage 5 chronic kidney disease or end stage renal disease: Secondary | ICD-10-CM | POA: Insufficient documentation

## 2010-09-18 DIAGNOSIS — N390 Urinary tract infection, site not specified: Secondary | ICD-10-CM | POA: Insufficient documentation

## 2010-09-18 DIAGNOSIS — R112 Nausea with vomiting, unspecified: Secondary | ICD-10-CM | POA: Insufficient documentation

## 2010-09-18 DIAGNOSIS — N185 Chronic kidney disease, stage 5: Secondary | ICD-10-CM | POA: Insufficient documentation

## 2010-09-18 DIAGNOSIS — E119 Type 2 diabetes mellitus without complications: Secondary | ICD-10-CM | POA: Insufficient documentation

## 2010-09-18 DIAGNOSIS — E7800 Pure hypercholesterolemia, unspecified: Secondary | ICD-10-CM | POA: Insufficient documentation

## 2010-09-18 LAB — URINALYSIS, ROUTINE W REFLEX MICROSCOPIC
Bilirubin Urine: NEGATIVE
Glucose, UA: NEGATIVE mg/dL
Ketones, ur: NEGATIVE mg/dL
Nitrite: NEGATIVE
Protein, ur: NEGATIVE mg/dL
Specific Gravity, Urine: 1.015 (ref 1.005–1.030)
Urobilinogen, UA: 0.2 mg/dL (ref 0.0–1.0)
pH: 5 (ref 5.0–8.0)

## 2010-09-18 LAB — COMPREHENSIVE METABOLIC PANEL
Albumin: 3.7 g/dL (ref 3.5–5.2)
BUN: 92 mg/dL — ABNORMAL HIGH (ref 6–23)
Calcium: 8.9 mg/dL (ref 8.4–10.5)
Creatinine, Ser: 3.87 mg/dL — ABNORMAL HIGH (ref 0.4–1.2)
Glucose, Bld: 91 mg/dL (ref 70–99)
Potassium: 4 mEq/L (ref 3.5–5.1)
Total Protein: 7.4 g/dL (ref 6.0–8.3)

## 2010-09-18 LAB — CBC
HCT: 33.2 % — ABNORMAL LOW (ref 36.0–46.0)
Hemoglobin: 11.4 g/dL — ABNORMAL LOW (ref 12.0–15.0)
MCH: 29.5 pg (ref 26.0–34.0)
MCHC: 34.3 g/dL (ref 30.0–36.0)
MCV: 85.8 fL (ref 78.0–100.0)
Platelets: 234 K/uL (ref 150–400)
RBC: 3.87 MIL/uL (ref 3.87–5.11)
RDW: 18.1 % — ABNORMAL HIGH (ref 11.5–15.5)
WBC: 7.1 K/uL (ref 4.0–10.5)

## 2010-09-18 LAB — POCT CARDIAC MARKERS
CKMB, poc: 2.2 ng/mL (ref 1.0–8.0)
CKMB, poc: 2 ng/mL (ref 1.0–8.0)
Myoglobin, poc: 245 ng/mL (ref 12–200)
Myoglobin, poc: 303 ng/mL (ref 12–200)
Troponin i, poc: <0.05 ng/mL (ref 0.00–0.09)
Troponin i, poc: <0.05 ng/mL (ref 0.00–0.09)

## 2010-09-18 LAB — GLUCOSE, CAPILLARY: Glucose-Capillary: 93 mg/dL (ref 70–99)

## 2010-09-18 LAB — URINE MICROSCOPIC-ADD ON

## 2010-09-18 LAB — DIFFERENTIAL
Basophils Absolute: 0 10*3/uL (ref 0.0–0.1)
Eosinophils Absolute: 0.1 10*3/uL (ref 0.0–0.7)
Lymphs Abs: 1.5 10*3/uL (ref 0.7–4.0)
Neutrophils Relative %: 71 % (ref 43–77)

## 2010-09-21 LAB — URINE CULTURE: Colony Count: >=100000

## 2010-09-27 ENCOUNTER — Encounter (HOSPITAL_COMMUNITY): Payer: MEDICARE

## 2010-09-27 ENCOUNTER — Other Ambulatory Visit: Payer: Self-pay | Admitting: Nephrology

## 2010-10-11 ENCOUNTER — Encounter (HOSPITAL_COMMUNITY): Payer: Medicare Other | Attending: Nephrology

## 2010-10-11 ENCOUNTER — Other Ambulatory Visit: Payer: Self-pay | Admitting: Nephrology

## 2010-10-11 DIAGNOSIS — D649 Anemia, unspecified: Secondary | ICD-10-CM | POA: Insufficient documentation

## 2010-10-11 DIAGNOSIS — I120 Hypertensive chronic kidney disease with stage 5 chronic kidney disease or end stage renal disease: Secondary | ICD-10-CM | POA: Insufficient documentation

## 2010-10-11 DIAGNOSIS — N186 End stage renal disease: Secondary | ICD-10-CM | POA: Insufficient documentation

## 2010-10-11 LAB — POCT HEMOGLOBIN-HEMACUE: Hemoglobin: 12.7 g/dL (ref 12.0–15.0)

## 2010-10-17 NOTE — Assessment & Plan Note (Signed)
OFFICE VISIT   KAILEENA, OBI  DOB:  Oct 22, 1932                                       10/25/2009  ZOXWR#:[id number]   The patient returns today for further evaluation for vascular access.  She has had a left forearm AV graft inserted by me on February 16 into  the brachial vein which was borderline.  This thrombosed 2 days later  and Dr. Arbie Cookey revised the inferior basilic vein which was also  borderline.  That occluded immediately and she now returns for  evaluation for further vascular access.  She is not yet on hemodialysis.   CHRONIC MEDICAL PROBLEMS:  1. Type 2 diabetes mellitus.  2. Hypertension.  3. Hyperlipidemia.  4. Chronic renal insufficiency.   SOCIAL HISTORY:  She is widowed, has 8 children and is retired.  Does  not use tobacco or alcohol.   REVIEW OF SYSTEMS:  Denies any chest pain.  Does have dyspnea on  exertion and difficulty with both lower extremities with ambulation,  arthritis, joint pain, muscle pain, depression, anxiety.  All other  systems in the review of systems are negative.   PHYSICAL EXAMINATION:  Blood pressure 160/69, heart rate 59, temperature  98.6. General:  She is a well developed, well nourished female in no  apparent distress.  She is alert and oriented x3.  HEENT:  Exam is  normal.  EOMs intact.  Lungs:  Clear.  No rhonchi or wheezing.  Cardiovascular:  Regular rhythm, no murmurs.  Abdomen:  Soft, nontender  with no masses.  Musculoskeletal exam:  Free of major deformities.  Upper extremity exam:  Left arm has an occluded left forearm graft with  palpable brachial and radial pulse at 3+.  Right upper extremity has  very small veins on physical exam with 3+ brachial and 2+ radial pulse.  Today I ordered vein mapping for basilic veins to see if she is a  candidate for basilic vein transposition.  The left side is thrombosed  as expected; the right side is very small and marginal.  I looked at it  with the  SonoSite and it is not adequate for basilic vein transposition.   This patient needs a left upper arm graft but is not yet on  hemodialysis.  We will wait until Dr. Leretha Dykes notifies our office when  she is getting closer to being on dialysis and at that time we will  insert a left upper arm graft.     Quita Skye Hart Rochester, M.D.  Electronically Signed   JDL/MEDQ  D:  10/25/2009  T:  10/26/2009  Job:  [id number]

## 2010-10-17 NOTE — Procedures (Signed)
CEPHALIC VEIN MAPPING   INDICATION:  Evaluate access for AV fistula placement.   HISTORY:   EXAM:  The right cephalic vein is not evaluated.   The right basilic vein is compressible.   Diameter measurements range from 0.16 to 0.36 cm.   The left cephalic vein is not evaluated.   The left basilic vein was not visualized.   See attached worksheet for all measurements.   IMPRESSION:  1. The patient's bilateral cephalic veins were not evaluated.  2. The left basilic vein is not visualized.  3. The right basilic vein is not of acceptable diameter for use as a      dialysis access site.  4. Incidental finding of thrombus noted in the left brachial vein.   ___________________________________________  Quita Skye. Hart Rochester, M.D.   CB/MEDQ  D:  10/26/2009  T:  10/26/2009  Job:  [id number]

## 2010-10-20 NOTE — Discharge Summary (Signed)
NAMEQUATISHA, ZYLKA                         ACCOUNT NO.:  [id number]   MEDICAL RECORD NO.:  [id number]                   PATIENT TYPE:  INP   LOCATION:  3707                                 FACILITY:  [facility]   PHYSICIAN:  Mohan N. Sharyn Lull, M.D.              DATE OF BIRTH:  23-Jan-1933   DATE OF ADMISSION:  03/18/2003  DATE OF DISCHARGE:  03/19/2003                                 DISCHARGE SUMMARY   ADMISSION DIAGNOSES:  1. Unstable angina, rule out myocardial infarction.  2. Hypertension.  3. Non-insulin-dependent diabetes mellitus.  4. Hypercholesterolemia.  5. Morbid obesity.  6. Degenerative joint disease.  7. History of pulmonary embolism.  8. History of deep venous thrombosis left leg.  9. Chronic anemia.   FINAL DIAGNOSES:  1. Mild coronary artery disease status post left catheterization.  2. Rule out esophageal spasm.  3. Rule out variant angina.  4. Rule out gastroesophageal reflux disease.  5. Hypertension.  6. Non-insulin-dependent diabetes mellitus.  7. Hypercholesterolemia.  8. Morbid obesity.  9. Degenerative joint disease.  10.      Chronic anemia, etiology unclear.  11.      History of pulmonary embolism in the past.  12.      History of deep venous thrombosis in the past.   DISCHARGE MEDICATIONS:  1. Baby aspirin 81 mg one tablet daily.  2. Nexium 40 mg one capsule daily half an hour before breakfast.  3. Cholesterol 10 mg one tablet daily.  4. Diovan 80 mg one tablet daily.  5. Metformin 500 mg b.i.d. which patient will start on March 21, 2003     (after two days).  6. Vicodin p.r.n. for back pain and shoulder pain.  7. Feosol 325 mg one tablet b.i.d.  8. Folic acid one tablet daily as before.   ACTIVITY:  Avoid heavy lifting, pushing or pulling for 48 hours.   DIET:  Low salt, low cholesterol, 1800 calorie ADA diet.   The patient has been advised to monitor blood sugar twice daily.  Post-  cardiac catheterization and activity instructions  have been given.  Follow  up with me in one week, and Dr. Petra Kuba as scheduled.   CONDITION ON DISCHARGE:  Stable.   BRIEF HISTORY AND HOSPITAL COURSE:  Ms. Guilbault is a 77 year old black  female with past medical history significant for hypertension, non-insulin-  dependent diabetes mellitus, hypercholesterolemia, degenerative joint  disease, history of bronchial asthma, morbid obesity, chronic anemia.  She  came to the ER at [facility] complaining of retrosternal and  precordial chest pain described as tightness, grade 8/10 without associated  symptoms.  She initially took Vicodin, thinking of musculoskeletal pain,  without relief so decided to come to the ER where she received three  sublingual nitroglycerin with relief of chest pain.  EKG done in the ER  showed normal sinus rhythm and sinus bradycardia with nonspecific T wave  changes  and minor T wave changes in the inferior leads.  The patient states  her chest pain lasted for more than one hours, denies any nausea, vomiting  or diaphoresis.  Denies paroxysmal nocturnal dyspnea, orthopnea, leg  swelling, denies shortness of breath.  Denies palpations, light headedness  or syncope.  The patient states lately she is under a lot of stress, gets  chest pain with walking and doing house work.  Denies any cardiac work up in  the past.   PAST MEDICAL HISTORY:  1. As above.  2. Also past history of pulmonary embolism and left leg deep venous     thrombosis in 1987.   PAST SURGICAL HISTORY:  She had left lung lobectomy in 1987 following  pulmonary embolism, in [place].  Status post breast reduction and  liposuction in 1981.  Had cholecystectomy in 1990.  Had left shoulder  surgery in 2003.   ALLERGIES:  PENICILLIN.   MEDICATIONS AT HOME:  1. Crestor 10 mg p.o. daily.  2. Metformin 500 mg p.o. b.i.d.  3. Diovan 80 mg p.o. daily.  4. Baby aspirin one p.o. daily.  5. Vicodin p.r.n.  6. Folic acid one tablet daily.    SOCIAL HISTORY:  She is divorced, has seven children.  Born in [place].  Moved from [place] in 1996.  She works as a Software engineer at [employer].  No  history of smoking or alcohol abuse.   FAMILY HISTORY:  Father died of complications of pneumonia and tuberculosis.  One brother has end-stage renal disease on hemodialysis.  One sister is  diabetic.   PHYSICAL EXAMINATION:  GENERAL:  She was alert and oriented x3, in no acute  distress.  VITAL SIGNS:  Blood pressure was 159/64, pulse was 49, sinus bradycardia on  the monitor.  EYES:  Conjunctivae pink.  NECK:  Supple, no JVD, no bruit.  LUNGS:  Clear to auscultation without rhonchi, rales.  CARDIOVASCULAR:  S1, S2 was normal, there was a soft systolic murmur, no S3  gallop or rub.  ABDOMEN:  Soft, bowel sounds were present, nontender.  EXTREMITIES:  No clubbing, cyanosis, there was 1+ left leg edema.   LABORATORY DATA:  Hemoglobin was 9.2, hematocrit 27.3, white count 6800.  Potassium was 4.8, blood sugar was 130.  Two sets of cardiac enzymes are  negative.  BUN 27, creatinine 1.4.  Troponin I was also negative.  Cholesterol was 112, LDL 50, HDL 39.   BRIEF HOSPITAL COURSE:  The patient was admitted to a telemetry unit.  Myocardial infarction was ruled out by serial enzymes and EKG.  The patient  was transferred to [facility].  The patient was started on IV  heparin and nitrates.  The patient did not have any further episodes of  chest pain during the hospital stay.  Discussed with patient and her son  regarding various options of treatment, i.e., noninvasive stress-testing  versus left catheterization, possible PTCA stenting, its risks i.e., that of  stroke, need for emergency CABG, the risk of restenosis, local vascular  complications, and consent for percutaneous coronary intervention.  The  patient underwent a selective left and right coronary angiography today without any complications.  The patient had mild coronary  artery disease  with  good LV systolic function.  The patient's groin is stable, there is no  evidence of hematoma or bruit.  The patient has been ambulating in the  hallway without any problems.  The patient will be discharged home on the  above medications and will be followed up in my office in one week.  The  patient will be scheduled to see Dr. Petra Kuba in two weeks.                                                 Eduardo Osier. Sharyn Lull, M.D.    MNH/MEDQ  D:  03/19/2003  T:  03/20/2003  Job:  [id number]   cc:   Eino Farber., M.D.  [address] [place]  [place] [address]  Fax: [phone]

## 2010-10-20 NOTE — Op Note (Signed)
. [facility]  Patient:    NICK, ARMEL Visit Number: [id number] MRN: [id number]          Service Type: DSU Location: [facility] Attending Physician:  Randolm Idol Dictated by:   Claude Manges. Cleophas Dunker, M.D. Proc. Date: 09/02/01 Admit Date:  07/15/2001 Discharge Date: 07/15/2001                             Operative Report  PREOPERATIVE DIAGNOSIS:  Adhesive capsulitis, left shoulder.  POSTOPERATIVE DIAGNOSIS:  Adhesive capsulitis, left shoulder.  OPERATION PERFORMED:  Manipulation of left shoulder.  SURGEON:  Claude Manges. Cleophas Dunker, M.D.  ANESTHESIA:  General mask.  COMPLICATIONS:  None.  INDICATIONS FOR PROCEDURE:  The patient is a 77 year old female seven weeks status post arthroscopic subacromial decompression and open distal clavicle resection, left shoulder.  Despite a course of physical therapy, she has developed adhesive capsulitis.  She is having some pain but considerable loss of motion.  She only probably had 90 degrees or so of active abduction and flexion and now is to have manipulation.  DESCRIPTION OF PROCEDURE:  With the patient comfortable on the operating room table and under general mask anesthetic, gentle manipulation of the left shoulder was performed.  There was obvious lysis of adhesions as I raised the arm fully overhead in the abduction and flexed position.  There was also considerable loss of external rotation.  Careful external rotation manipulation was performed.  Subsequently had full motion to the right upper extremity.  The shoulder was then prepped with Betadine and alcohol.  I injected 8 cc of 0.25% Marcaine without epinephrine and 40 mg of Depo-Medrol. A band-aid was applied.  The patient was then awakened and returned to the post anesthesia care unit.  PLAN:  Follow-up physical therapy, Vicodin for pain, office one week. Dictated by:   Claude Manges. Cleophas Dunker, M.D. Attending Physician:  Randolm Idol DD:  09/02/01 TD:  09/02/01 Job: [id number]

## 2010-10-20 NOTE — Op Note (Signed)
NAMEBRENAE, [name]                       ACCOUNT NO.:  [id number]   MEDICAL RECORD NO.:  [id number]                   PATIENT TYPE:  AMB   LOCATION:  ENDO                                 FACILITY:  [facility]   PHYSICIAN:  Georgiana Spinner, M.D.                 DATE OF BIRTH:  [birth year]   DATE OF PROCEDURE:  DATE OF DISCHARGE:                                 OPERATIVE REPORT   PROCEDURE:  Upper endoscopy.   INDICATIONS:  Followup of previous H. pylori ulcer.   ANESTHESIA:  Demerol 50 mg, Versed 6 mg.   DESCRIPTION OF PROCEDURE:  With the patient mildly sedated in the left  lateral decubitus position, the Olympus videoscopic endoscope was inserted  into the mouth and passed under direct vision through the esophagus which  appeared normal.  We entered the stomach and the fundus, body, antrum,  duodenal bulb, and second portion of the duodenum all appeared normal.  From  this point, the endoscope was slowly withdrawn, taking circumferential views  of the duodenal mucosa until the endoscope was pulled into the stomach and  placed in retroflexion, viewing the stomach from below.  The endoscope was  then straightened and withdrawn, taking circumferential views of the  remaining gastric and esophageal mucosa, stopping only to take biopsies and  the area in the antrum appeared somewhat prominent.  The patient's vital  signs and pulse oximetry remained stable and the patient tolerated the  procedure well without apparent complications.   FINDINGS:  Unremarkable examination.   PLAN:  Await biopsy report, the patient will call me for results and follow  up with me as an outpatient.                                               Georgiana Spinner, M.D.    GMO/MEDQ  D:  10/18/2003  T:  10/18/2003  Job:  [id number]   cc:   Eino Farber., M.D.  [address] [place]  [place] [address]  Fax: [phone]

## 2010-10-20 NOTE — Op Note (Signed)
[name], [name]                       ACCOUNT NO.:  [id number]   MEDICAL RECORD NO.:  [id number]                   PATIENT TYPE:  AMB   LOCATION:  ENDO                                 FACILITY:  [facility]   PHYSICIAN:  Georgiana Spinner, M.D.                 DATE OF BIRTH:  1933/05/02   DATE OF PROCEDURE:  DATE OF DISCHARGE:                                 OPERATIVE REPORT   PROCEDURE:  Upper endoscopy.   INDICATIONS:  Hemoccult positivity.   ANESTHESIA:  Demerol 75, Versed 5 mg.   DESCRIPTION OF PROCEDURE:  With the patient mildly sedated in the left  lateral decubitus position, the Olympus videoscopic endoscope was inserted  in the mouth, passed under direct vision through the esophagus which  appeared normal.  There was no evidence of Barrett's.  We entered into the  stomach.  Fundus and body appeared normal.  The antrum showed some changes  that may have been submucosal with corrugation and irregularity of one area  of the greater curvature.  This was photographed and biopsied.  We entered  in the duodenal bulb and second portion of the duodenum.  Both appeared  normal.  From this point, the endoscope was slowly withdrawn taking  circumferential views of the duodenal mucosal until the endoscope had been  pulled back into the stomach, placed in retroflexion to view the stomach  from below.  The endoscope was straightened and withdrawn taking  circumferential views of the remaining gastric and esophageal mucosa.  The  patient's vital signs and pulse oximeter remained stable.  The patient  tolerated the procedure well and without apparent complications.   FINDINGS:  Irregularity of the antrum possibly submucosal in nature, await  biopsy reports.  The patient will call me for results and follow up with me  as an outpatient.  Depending on the report, endoscopic ultrasound may be  necessary to evaluate this further.  Proceed to colonoscopy is planned.                       Georgiana Spinner, M.D.    GMO/MEDQ  D:  08/24/2003  T:  08/24/2003  Job:  [id number]

## 2010-10-20 NOTE — Op Note (Signed)
[name], HARTEL                       ACCOUNT NO.:  [id number]   MEDICAL RECORD NO.:  [id number]                   PATIENT TYPE:  AMB   LOCATION:  ENDO                                 FACILITY:  [facility]   PHYSICIAN:  Georgiana Spinner, M.D.                 DATE OF BIRTH:  Sep 20, 1932   DATE OF PROCEDURE:  08/24/2003  DATE OF DISCHARGE:                                 OPERATIVE REPORT   PROCEDURE:  Colonoscopy.   INDICATIONS FOR PROCEDURE:  Hemoccult positivity.   ANESTHESIA:  Demerol 50, Versed 3 mg.   PROCEDURE:  With the patient mildly sedated in the left lateral decubitus  position, the Olympus videoscopic colonoscope was inserted in the rectum and  passed under direct vision through a tortuous colon and a sigmoid colon that  is diverticula filled, until we reached the cecum identified by the  ileocecal valve and appendiceal orifice, both of which were photographed.  From this point, the colonoscope was slowly withdrawn taking circumferential  views of the entire colonic mucosa stopping only in the rectum which  appeared normal on direct and showed hemorrhoids on retroflex view.  The  endoscope was straightened and withdrawn.  The patient's vital signs and  pulse oximeter remained stable.  The patient tolerated the procedure well  without apparent complications.   FINDINGS:  Internal hemorrhoids, diverticulosis of the sigmoid colon,  otherwise, unremarkable examination.   PLAN:  See endoscopy note for further details.                                               Georgiana Spinner, M.D.    GMO/MEDQ  D:  08/24/2003  T:  08/24/2003  Job:  [id number]   cc:   Eino Farber., M.D.  [address] [place]  [place] [address]  Fax: [phone]

## 2010-10-20 NOTE — Cardiovascular Report (Signed)
[name], [name]                         ACCOUNT NO.:  [id number]   MEDICAL RECORD NO.:  [id number]                   PATIENT TYPE:  INP   LOCATION:  3707                                 FACILITY:  [facility]   PHYSICIAN:  Mohan N. Sharyn Lull, M.D.              DATE OF BIRTH:  14-Oct-1932   DATE OF PROCEDURE:  03/19/2003  DATE OF DISCHARGE:  03/19/2003                              CARDIAC CATHETERIZATION   PROCEDURE:  Left cardiac catheterization and selective left and right  coronary angiography, left ventriculography, via right groin using Judkins  technique.   INDICATION FOR PROCEDURE:  [name] is a 77 year old black female with  past medical history significant for hypertension, noninsulin-dependent  diabetes mellitus, hypercholesterolemia, chronic anemia, degenerative joint  disease, history of bronchial asthma, morbid obesity.  She came to the ER at  [facility] complaining of retrosternal and precordial chest pain  described as tightness and graded 8/10 without associated symptoms.  The  patient initially thought it was musculoskeletal pain and took Vicodin  without relief of chest pain so decided to come to ER.  The patient received  three sublingual nitroglycerin in the ER with relief of chest pain.  States  pain lasted for more than one hour.  Denies any nausea, vomiting,  diaphoresis, generalized PND, orthopnea, leg swelling.  Denies shortness of  breath.  Denies palpitations, lightheadedness, or syncope.  The patient  states lately she is under a lot of stress.  There is chest pain with  walking and doing housework.  Denies any cardiac workup in the past.  EKG  showed sinus bradycardia with nonspecific T-wave and minor T-wave inversion  in the inferior leads.  Discussed with the patient and her son regarding  various options of treatment, i.e., noninvasive stress test as her cardiac  enzymes are negative, versus left catheterization, possible PTCA, stenting,  and its risks, i.e., death, MI, stroke, need for emergency CABG, risk of  restenosis, local vascular complications, etc., and consented for PCI.   DESCRIPTION OF PROCEDURE:  After obtaining the informed consent, the patient  was brought to the catheterization lab and was placed on fluoroscopy table.  The right groin was prepped and draped in the usual fashion.  Xylocaine 2%  was used for local anesthesia in the right groin.  With the help of thin-  walled needle, 6 French arterial sheath was placed.  The sheath was  aspirated and flushed.  Next a 6 French left Judkins catheter was advanced  over the wire under fluoroscopic guidance into the ascending aorta.  The  wire was pulled out.  The catheter was aspirated and connected to the  manifold.  The catheter was further advanced and engaged into the left  coronary ostium.  Multiple views of the left system were taken.  Next the  catheter was disengaged and was pulled out over the wire and was replaced  with 6 [place] right  Judkins catheter, which was advanced over the wire under  fluoroscopic guidance up to the ascending aorta.  The wire was pulled out,  the catheter was aspirated and connected to the manifold.  The catheter was  further advanced and engaged into right coronary ostium.  Multiple views of  the right system were taken.  Next the catheter was disengaged and was  pulled out over the wire and was replaced with 6 French pigtail catheter,  which was advanced over the wire under fluoroscopic guidance up to the  ascending aorta.  The wire was pulled out.  The catheter was aspirated and  connected to the manifold.  The catheter was further advanced across the  aortic valve into the LV.  LV pressures were reported.  Next left  ventriculography was done in 30 degree RAO position.  Post-angiographic  pressures were recorded from LV and then pullback pressures were recorded  from the aorta.  There was no gradient across the aortic valve.   Next the  pigtail catheter was pulled out over the wire, sheaths were aspirated and  flushed.   FINDINGS:  1. LV showed good LV systolic function.  Mild LVH.  EF of 55-60%.  2. Left main was patent.  3. LAD has 10-15% midstenosis.  Distally the vessel tapers down at apex.     Diagonal 1 and 2 were very, very small.  Diagonal 3 was small, which was     patent.  4. Left circumflex was patent.  5. RCA was patent.   Arteriotomy was closed with Perclose without complications.  The patient  tolerated the procedure well.  The patient will be discharged home this  evening if hemodynamically stable.  Will do the anemia workup as outpatient  and will refer to GI if she continues to have recurrent chest pain.                                               Eduardo Osier. Sharyn Lull, M.D.    MNH/MEDQ  D:  03/19/2003  T:  03/22/2003  Job:  [id number]   cc:   [facility]   Eino Farber., M.D.  [address] [place]  [place] [address]  Fax: [phone]

## 2010-10-20 NOTE — Op Note (Signed)
[place]. [facility]  Patient:    ELIANAH, KARIS Visit Number: [id number] MRN: [id number]          Service Type: DSU Location: [facility] Attending Physician:  Randolm Idol Dictated by:   Claude Manges. Cleophas Dunker, M.D. Proc. Date: 07/15/01 Admit Date:  07/15/2001                             Operative Report  PREOPERATIVE DIAGNOSIS: 1. Impingement, left shoulder. 2. Degenerative joint disease, acromioclavicular joint.  POSTOPERATIVE DIAGNOSIS: 1. Impingement, left shoulder. 2. Degenerative joint disease, acromioclavicular joint.  PROCEDURE: 1. Arthroscopic subacromial decompression. 2. Open distal clavicle resection.  SURGEON:  Claude Manges. Cleophas Dunker, M.D.  ANESTHESIA:  General orotracheal.  COMPLICATIONS:  None.  INDICATIONS:  This is 77 year old female who has been experiencing left shoulder pain for months to the point of compromise.  She has clinical evidence of impingement and does not respond to cortisone injections or anti-inflammatory medicine.  She recently had an MRI scan that revealed partial peripheral surface tears of the supraspinatus tendon and marked hypertrophic spurring of the [facility] joint and acromion causing impingement.  She has not had an arthroscopic decompression.  DESCRIPTION OF PROCEDURE:  With the patient comfortable on the operating table and under general orotracheal anesthesia, the patient was placed in a semi-sitting position with the shoulder frame.  The left shoulder was then prepped with DuraPrep and the base of the neck circumferentially to below the elbow.  Sterile draping was performed.  A marking pen was used to outline the [facility] joint, the coracoid and the acromion at a point a fingerbreadth inferior and medial to the posterior angle of the acromion.  A small stab wound was made.  The arthroscope was then placed into the shoulder.  Diagnostic arthroscopy revealed areas of considerable chondromalacia of the humeral  head consistent with arthritis.  There were lesser areas of chondromalacia of the glenoid.  The anterior glenoid labrum was intact as was the biceps tendon.  I do not see an obvious rotator cuff tear.  The arthroscope was then placed in the subacromial space. A second and third portal were established in the mid lateral space and then anteriorly. Arthroscopic subacromial decompression was performed using the arthroscope and the shaver as well as the Arthrocare wand.  There was considerable subacromial bursitis and brisk bleeding from many different areas which made the procedure somewhat technically difficult.  I used a 6 mm, hooded bur to perform an anterior and lateral acromioplasty.  There was considerable spurring and impingement.  I carefully evaluated the rotator cuff on the bursal surface. There were some partial surface tears but I did not see any through-and-through tears and there was considerable bursitis which I resected with both the shaver as well as the Arthrocare wand.  I attempted to perform a distal clavicle resection through the scope.  Because of the bleeding, visualization was very poor and accordingly I elected to perform an open distal clavicle resection.  About 1-inch to 1-1/4 inch was performed longitudinally directly over the [facility] joint.  Very sharp dissection carried out through the subcutaneous tissue.  Capsule of the [facility] joint was identified and then incised along the length of skin incision.  Self-retaining retractor was inserted.  Subperiosteal dissection was performed and then the distal centimeter of clavicle was resected with a oscillating saw blade.  Bone wax was applied to bleeding bone surfaces.  With the finger I was able  to evaluate the joint.  I thought an excellent anterior lateral decompression and there was no further spurring beneath the clavicle.  I could visualize the rotator cuff and I also could not see any obvious tears.  The wound was  irrigated with saline solution.  The [facility] joint capsule was closed with interrupted 0 Vicryl, the subcutaneous tissue with 2-0 Vicryl. Skin closed with a running 4-0 Ethilon and 0.25% Marcaine with epinephrine injected.  _____ . Sterile bulky dressing was applied followed by a sling. The patient tolerated the procedure without complications.  Plan is recovery care overnight.  Office one week.  Percocet for pain. Dictated by:   Claude Manges. Cleophas Dunker, M.D. Attending Physician:  Randolm Idol DD:  07/15/01 TD:  07/15/01 Job: [id number]

## 2010-10-20 NOTE — Op Note (Signed)
[name], [name]             ACCOUNT NO.:  [id number]   MEDICAL RECORD NO.:  [id number]          PATIENT TYPE:  INP   LOCATION:  2892                         FACILITY:  [facility]   PHYSICIAN:  Reinaldo Meeker, M.D. DATE OF BIRTH:  02-Nov-1932   DATE OF PROCEDURE:  03/09/2004  DATE OF DISCHARGE:                                 OPERATIVE REPORT   PREOPERATIVE DIAGNOSIS:  Herniated disk at C4-5 with cervical myelopathy.   POSTOPERATIVE DIAGNOSIS:  Herniated disk at C4-5 with cervical myelopathy.   OPERATION PERFORMED:  C4-5 anterior cervical diskectomy with bone bank  fusion followed by Zephyr anterior cervical plating with operating  microscope   SURGEON:  Reinaldo Meeker, M.D.   ASSISTANT:  Tia Alert, MD   ANESTHESIA:  General.   DESCRIPTION OF PROCEDURE:  After being placed in the supine position in five  pounds halter traction, the patient's neck was prepped and draped in the  usual sterile fashion.  A localizing x-ray was taken prior to incision to  identify the appropriate level.  A transverse incision was made in the right  anterior neck starting at the midline and heading towards the medial aspect  of the sternocleidomastoid muscle.  The platysma muscle was then incised  transversely.  The natural fascial plane between the strap muscles medially  and sternocleidomastoid muscle laterally was identified, followed down to  the anterior aspect of the cervical spine.  Longus colli muscles were  identified and split in the midline and stripped away bilaterally with  Kitner resector.  A self-retaining retractor was placed for exposure.  X-ray  showed approach to the appropriate level.  Using a 15 blade, the annulus of  the disk was incised.  Using pituitary rongeurs and curettes, a thorough  disk space cleanout was carried out approximately 90% of the disk depth.  A  high speed drill was used to widen the interspace.  The microscope was  draped, brought into the field  and used for the remainder of the case.  Using microdissection technique, the remainder of the disk material down to  the posterior longitudinal ligament was removed.  The ligament was then  incised transversely.  A large amount of herniated disk material in the  midline was removed which gave excellent decompression of the spinal cord  dura.  Decompression was carried out bilaterally until the proximal nerve  roots could be identified bilaterally and the foramina were found to be  patent.  At this time inspection was carried out in all directions for any  evidence of residual compression.  None could be identified.  Large amounts  of irrigation were carried out and any bleeding controlled with bipolar  coagulation and Gelfoam.  Measurements were taken and 6 mm bone bank plug  reconstituted.  After irrigating once more and confirming hemostasis, the  plug was impacted without difficulty and fluoroscopy showed it to be in good  position.  An appropriate length Zephyr anterior cervical plate was then  chosen.  Under fluoroscopic guidance, drill holes were placed followed by  placement of 15 mm screws times four.  Final fluoroscopy  showed the plate,  screws and plug to all be in good position.  Large amounts of irrigation  were carried  out at this time and any bleeding controlled with bipolar coagulation.  The  wound was then closed using interrupted Vicryl in the muscle, inverted 5-0  PDS in the subcuticular layer and Steri-Strips on the skin.  A sterile  dressing and soft collar were applied and the patient was extubated and  taken to the recovery room in stable condition.       ROK/MEDQ  D:  03/09/2004  T:  03/09/2004  Job:  [id number]

## 2010-10-20 NOTE — Op Note (Signed)
[name], [name]             ACCOUNT NO.:  [id number]   MEDICAL RECORD NO.:  [id number]          PATIENT TYPE:  AMB   LOCATION:  DSC                          FACILITY:  [facility]   PHYSICIAN:  Claude Manges. Whitfield, M.D.DATE OF BIRTH:  [birth year]   DATE OF PROCEDURE:  03/28/2006  DATE OF DISCHARGE:                                 OPERATIVE REPORT   PREOPERATIVE DIAGNOSES:  1. Rotator cuff tear right shoulder with impingement.  2. Degenerative joint disease acromioclavicular joint.   POSTOPERATIVE DIAGNOSES:  1. Rotator cuff tear right shoulder with impingement.  2. Degenerative joint disease acromioclavicular joint.   PROCEDURE:  1. Arthroscopic debridement, right shoulder.  2. Arthroscopic subacromial decompression.  3. Arthroscopic distal clavicle resection.  4. Mini open rotator cuff tendon tear repair.   SURGEON:  Dr. Cleophas Dunker   ASSISTANT:  Arnoldo Morale, [facility]   ANESTHESIA:  General.   COMPLICATIONS:  None.   HISTORY:  A 77 year old female has had numerous episodes of recurrent  shoulder pain over the past year.  She has difficulty with overhead motion  and even to the point where she has had difficulty sleeping.  She has had  cortisone injection and anti-inflammatory medicines without much relief of  her pain, so an MRI scan was performed, revealing a small full-thickness  tear of the distal supraspinatus.  She had stable bicipital tendinosus  without evidence of labral tear and some degenerative joints at the [facility] joint  with anterior sloping acromion.  She wishes to proceed with surgical  correction.   PROCEDURE:  With the patient comfortable on the operating table and under  general orotracheal anesthesia, the patient was placed in the semi-sitting  position with a shoulder frame.  Examination of the shoulder revealed no  evidence of adhesive capsulitis or instability.   The shoulder was then prepped from the base of the neck circumferentially  below the elbow with  DuraPrep.  Sterile draping was performed.   A marking pen was used to outline the [facility] joint, the coracoid, and the  acromion.  At a point a fingerbreadth posterior and medial to the post angle  acromion, a small stab wound was made.  The arthroscope was then easily  placed into the shoulder joint.  Diagnostic arthroscopy revealed very  minimal synovitis.  The biceps was intact.  There was an obvious tear of the  rotator cuff, as I could visualize the subacromial space.  I did not see any  appreciable chondromalacia of the humeral head or the glenoid.  There were  no loose bodies.  Debridement of some of the synovitis was performed through  a second portal established anteriorly using the ArthroCare wand.   The arthroscope was placed in the subacromial space posteriorly, the cannula  in the subacromial space anteriorly, and a third portal established in a  lateral subacromial space.  An arthroscopic subacromial decompression was  performed, removing considerable bursal tissue.  There was obvious overhang  of the acromion and the distal clavicle.  Accordingly, a 6 mm bur was used  to perform an anterior-inferior acromioplasty with a nice decompression.  I  also performed a distal clavicle resection with removing all spurs in any  areas of impingement.  The cuff tear was also visualized from the bursal  surface.   A mini open rotator cuff tear repair was then performed.  About a 1.5 inch  incision was made over the anterior aspect of the shoulder via sharp  dissection and carried down to the subcutaneous tissue.  Gross bleeders were  Bovie coagulated.  Self-retaining retractor was inserted.  Deltoid fascia  was identified and along its fibers, a Bovie was used to dissect.  Then via  blunt dissection, the interval was opened so I could visualize the  subacromial space.  Biceps tendon was intact as was noted by arthroscopy.  The supraspinatus was torn in an L fashion along the humeral head  from  medial to lateral and then longitudinally and superiorly along the biceps  tendon.  The edges were identified; they were manually freed so that I could  now juxtapose them, and they were sutured with interrupted 0 Ethibond suture  with a very nice closure and no evidence of tension or impingement.  The  joint and subacromial space was then copiously irrigated with saline  solution.  The deltoid fascia closed with running 0 Vicryl, the subcu with 2-  0 Vicryl, the skin closed with skin clips.  Marcaine with epinephrine was  injected into the wound edges.  Sterile bulky dressing was applied followed  by a sling.   PLAN:  Recovery care.  Percocet for pain.  Office 1 week.      Claude Manges. Cleophas Dunker, M.D.  Electronically Signed     PWW/MEDQ  D:  03/28/2006  T:  03/29/2006  Job:  [id number]

## 2010-10-25 ENCOUNTER — Other Ambulatory Visit: Payer: Self-pay | Admitting: Nephrology

## 2010-10-25 ENCOUNTER — Encounter (HOSPITAL_COMMUNITY): Payer: Medicare Other

## 2010-10-25 LAB — RENAL FUNCTION PANEL
Albumin: 3.4 g/dL — ABNORMAL LOW (ref 3.5–5.2)
BUN: 73 mg/dL — ABNORMAL HIGH (ref 6–23)
Chloride: 98 mEq/L (ref 96–112)
Phosphorus: 3.4 mg/dL (ref 2.3–4.6)
Potassium: 4.3 mEq/L (ref 3.5–5.1)
Sodium: 135 mEq/L (ref 135–145)

## 2010-10-25 LAB — POCT HEMOGLOBIN-HEMACUE: Hemoglobin: 12.1 g/dL (ref 12.0–15.0)

## 2010-11-08 ENCOUNTER — Other Ambulatory Visit: Payer: Self-pay | Admitting: Nephrology

## 2010-11-08 ENCOUNTER — Encounter (HOSPITAL_COMMUNITY): Payer: Medicare Other | Attending: Nephrology

## 2010-11-08 DIAGNOSIS — I120 Hypertensive chronic kidney disease with stage 5 chronic kidney disease or end stage renal disease: Secondary | ICD-10-CM | POA: Insufficient documentation

## 2010-11-08 DIAGNOSIS — N186 End stage renal disease: Secondary | ICD-10-CM | POA: Insufficient documentation

## 2010-11-08 DIAGNOSIS — D649 Anemia, unspecified: Secondary | ICD-10-CM | POA: Insufficient documentation

## 2010-11-08 LAB — POCT HEMOGLOBIN-HEMACUE: Hemoglobin: 12.3 g/dL (ref 12.0–15.0)

## 2010-11-22 ENCOUNTER — Other Ambulatory Visit: Payer: Self-pay | Admitting: Nephrology

## 2010-11-22 ENCOUNTER — Encounter (HOSPITAL_COMMUNITY): Payer: Medicare Other

## 2010-11-22 LAB — RENAL FUNCTION PANEL
BUN: 78 mg/dL — ABNORMAL HIGH (ref 6–23)
CO2: 24 mEq/L (ref 19–32)
Calcium: 9 mg/dL (ref 8.4–10.5)
Glucose, Bld: 144 mg/dL — ABNORMAL HIGH (ref 70–99)
Phosphorus: 4.1 mg/dL (ref 2.3–4.6)

## 2010-11-22 LAB — POCT HEMOGLOBIN-HEMACUE: Hemoglobin: 10.1 g/dL — ABNORMAL LOW (ref 12.0–15.0)

## 2010-11-23 LAB — POCT HEMOGLOBIN-HEMACUE: Hemoglobin: 10 g/dL — ABNORMAL LOW (ref 12.0–15.0)

## 2010-12-07 ENCOUNTER — Other Ambulatory Visit: Payer: Self-pay | Admitting: Nephrology

## 2010-12-07 ENCOUNTER — Encounter (HOSPITAL_COMMUNITY): Payer: Medicare Other | Attending: Nephrology

## 2010-12-07 DIAGNOSIS — D649 Anemia, unspecified: Secondary | ICD-10-CM | POA: Insufficient documentation

## 2010-12-07 DIAGNOSIS — N186 End stage renal disease: Secondary | ICD-10-CM | POA: Insufficient documentation

## 2010-12-07 DIAGNOSIS — I120 Hypertensive chronic kidney disease with stage 5 chronic kidney disease or end stage renal disease: Secondary | ICD-10-CM | POA: Insufficient documentation

## 2010-12-07 LAB — POCT HEMOGLOBIN-HEMACUE: Hemoglobin: 10.9 g/dL — ABNORMAL LOW (ref 12.0–15.0)

## 2010-12-21 ENCOUNTER — Other Ambulatory Visit: Payer: Self-pay | Admitting: Nephrology

## 2010-12-21 ENCOUNTER — Encounter (HOSPITAL_COMMUNITY): Payer: Medicare Other

## 2010-12-21 LAB — POCT HEMOGLOBIN-HEMACUE: Hemoglobin: 11.3 g/dL — ABNORMAL LOW (ref 12.0–15.0)

## 2011-01-04 ENCOUNTER — Other Ambulatory Visit: Payer: Self-pay | Admitting: Nephrology

## 2011-01-04 ENCOUNTER — Encounter (HOSPITAL_COMMUNITY): Payer: Medicare Other | Attending: Nephrology

## 2011-01-04 DIAGNOSIS — N186 End stage renal disease: Secondary | ICD-10-CM | POA: Insufficient documentation

## 2011-01-04 DIAGNOSIS — I120 Hypertensive chronic kidney disease with stage 5 chronic kidney disease or end stage renal disease: Secondary | ICD-10-CM | POA: Insufficient documentation

## 2011-01-04 DIAGNOSIS — D649 Anemia, unspecified: Secondary | ICD-10-CM | POA: Insufficient documentation

## 2011-01-04 LAB — POCT HEMOGLOBIN-HEMACUE: Hemoglobin: 12.1 g/dL (ref 12.0–15.0)

## 2011-01-18 ENCOUNTER — Other Ambulatory Visit: Payer: Self-pay | Admitting: Nephrology

## 2011-01-18 ENCOUNTER — Encounter (HOSPITAL_COMMUNITY): Payer: Medicare Other

## 2011-02-01 ENCOUNTER — Other Ambulatory Visit: Payer: Self-pay | Admitting: Nephrology

## 2011-02-01 ENCOUNTER — Encounter (HOSPITAL_COMMUNITY): Payer: Medicare Other

## 2011-02-15 ENCOUNTER — Other Ambulatory Visit: Payer: Self-pay | Admitting: Nephrology

## 2011-02-15 ENCOUNTER — Encounter (HOSPITAL_COMMUNITY): Payer: Medicare Other | Attending: Nephrology

## 2011-02-15 DIAGNOSIS — D649 Anemia, unspecified: Secondary | ICD-10-CM | POA: Insufficient documentation

## 2011-02-15 DIAGNOSIS — N186 End stage renal disease: Secondary | ICD-10-CM | POA: Insufficient documentation

## 2011-02-15 DIAGNOSIS — I120 Hypertensive chronic kidney disease with stage 5 chronic kidney disease or end stage renal disease: Secondary | ICD-10-CM | POA: Insufficient documentation

## 2011-02-15 LAB — POCT HEMOGLOBIN-HEMACUE: Hemoglobin: 11.8 g/dL — ABNORMAL LOW (ref 12.0–15.0)

## 2011-02-23 ENCOUNTER — Emergency Department (HOSPITAL_COMMUNITY)
Admission: EM | Admit: 2011-02-23 | Discharge: 2011-02-24 | Disposition: A | Discharge status: Home / Self Care | Payer: Medicare Other | Attending: Emergency Medicine | Admitting: Emergency Medicine

## 2011-02-23 DIAGNOSIS — Z86711 Personal history of pulmonary embolism: Secondary | ICD-10-CM | POA: Insufficient documentation

## 2011-02-23 DIAGNOSIS — E7800 Pure hypercholesterolemia, unspecified: Secondary | ICD-10-CM | POA: Insufficient documentation

## 2011-02-23 DIAGNOSIS — Z79899 Other long term (current) drug therapy: Secondary | ICD-10-CM | POA: Insufficient documentation

## 2011-02-23 DIAGNOSIS — R110 Nausea: Secondary | ICD-10-CM | POA: Insufficient documentation

## 2011-02-23 DIAGNOSIS — K219 Gastro-esophageal reflux disease without esophagitis: Secondary | ICD-10-CM | POA: Insufficient documentation

## 2011-02-23 DIAGNOSIS — N390 Urinary tract infection, site not specified: Secondary | ICD-10-CM | POA: Insufficient documentation

## 2011-02-23 DIAGNOSIS — I10 Essential (primary) hypertension: Secondary | ICD-10-CM | POA: Insufficient documentation

## 2011-02-23 DIAGNOSIS — E119 Type 2 diabetes mellitus without complications: Secondary | ICD-10-CM | POA: Insufficient documentation

## 2011-02-23 LAB — URINALYSIS, ROUTINE W REFLEX MICROSCOPIC
Glucose, UA: NEGATIVE mg/dL
Ketones, ur: NEGATIVE mg/dL
Protein, ur: NEGATIVE mg/dL
Urobilinogen, UA: 0.2 mg/dL (ref 0.0–1.0)

## 2011-02-23 LAB — DIFFERENTIAL
Eosinophils Relative: 1 % (ref 0–5)
Lymphocytes Relative: 24 % (ref 12–46)
Lymphs Abs: 1.5 10*3/uL (ref 0.7–4.0)
Monocytes Relative: 6 % (ref 3–12)

## 2011-02-23 LAB — COMPREHENSIVE METABOLIC PANEL
ALT: 26 U/L (ref 0–35)
AST: 56 U/L — ABNORMAL HIGH (ref 0–37)
Alkaline Phosphatase: 71 U/L (ref 39–117)
CO2: 30 mEq/L (ref 19–32)
Chloride: 98 mEq/L (ref 96–112)
GFR calc Af Amer: 13 mL/min — ABNORMAL LOW (ref >60)
GFR calc non Af Amer: 11 mL/min — ABNORMAL LOW (ref >60)
Glucose, Bld: 142 mg/dL — ABNORMAL HIGH (ref 70–99)
Potassium: 4.3 mEq/L (ref 3.5–5.1)
Sodium: 141 mEq/L (ref 135–145)
Total Bilirubin: 0.3 mg/dL (ref 0.3–1.2)

## 2011-02-23 LAB — CBC
HCT: 39.6 % (ref 36.0–46.0)
MCH: 29.3 pg (ref 26.0–34.0)
MCV: 86.1 fL (ref 78.0–100.0)
RBC: 4.6 MIL/uL (ref 3.87–5.11)
RDW: 17.7 % — ABNORMAL HIGH (ref 11.5–15.5)
WBC: 6.3 10*3/uL (ref 4.0–10.5)

## 2011-02-23 LAB — URINE MICROSCOPIC-ADD ON

## 2011-02-24 LAB — CK TOTAL AND CKMB (NOT AT ARMC): Relative Index: 1 (ref 0.0–2.5)

## 2011-02-27 ENCOUNTER — Other Ambulatory Visit: Payer: Self-pay | Admitting: Internal Medicine

## 2011-02-27 ENCOUNTER — Encounter (HOSPITAL_BASED_OUTPATIENT_CLINIC_OR_DEPARTMENT_OTHER): Payer: Medicare Other | Admitting: Internal Medicine

## 2011-02-27 DIAGNOSIS — D472 Monoclonal gammopathy: Secondary | ICD-10-CM

## 2011-02-27 DIAGNOSIS — D892 Hypergammaglobulinemia, unspecified: Secondary | ICD-10-CM

## 2011-02-27 LAB — CBC WITH DIFFERENTIAL/PLATELET
Basophils Absolute: 0 10*3/uL (ref 0.0–0.1)
Eosinophils Absolute: 0.1 10*3/uL (ref 0.0–0.5)
HCT: 40 % (ref 34.8–46.6)
HGB: 13.2 g/dL (ref 11.6–15.9)
MCV: 90.3 fL (ref 79.5–101.0)
MONO%: 6.2 % (ref 0.0–14.0)
NEUT#: 4.2 10*3/uL (ref 1.5–6.5)
NEUT%: 71.4 % (ref 38.4–76.8)
RDW: 19.9 % — ABNORMAL HIGH (ref 11.2–14.5)

## 2011-02-28 ENCOUNTER — Encounter: Payer: Medicare Other | Admitting: Internal Medicine

## 2011-02-28 LAB — COMPREHENSIVE METABOLIC PANEL
Albumin: 4.1 g/dL (ref 3.5–5.2)
Alkaline Phosphatase: 67 U/L (ref 39–117)
BUN: 87 mg/dL — ABNORMAL HIGH (ref 6–23)
Calcium: 9 mg/dL (ref 8.4–10.5)
Chloride: 95 mEq/L — ABNORMAL LOW (ref 96–112)
Creatinine, Ser: 5 mg/dL — ABNORMAL HIGH (ref 0.50–1.10)
Glucose, Bld: 192 mg/dL — ABNORMAL HIGH (ref 70–99)
Potassium: 4 mEq/L (ref 3.5–5.3)

## 2011-02-28 LAB — KAPPA/LAMBDA LIGHT CHAINS: Kappa:Lambda Ratio: 0.08 — ABNORMAL LOW (ref 0.26–1.65)

## 2011-02-28 LAB — BETA 2 MICROGLOBULIN, SERUM: Beta-2 Microglobulin: 7.93 mg/L — ABNORMAL HIGH (ref 1.01–1.73)

## 2011-02-28 LAB — IGG, IGA, IGM: IgG (Immunoglobin G), Serum: 1990 mg/dL — ABNORMAL HIGH (ref 690–1700)

## 2011-02-28 LAB — LACTATE DEHYDROGENASE: LDH: 208 U/L (ref 94–250)

## 2011-03-01 ENCOUNTER — Other Ambulatory Visit: Payer: Self-pay | Admitting: Nephrology

## 2011-03-01 ENCOUNTER — Encounter (HOSPITAL_COMMUNITY)
Admission: RE | Admit: 2011-03-01 | Discharge: 2011-03-01 | Payer: Medicare Other | Source: Ambulatory Visit | Attending: Nephrology | Admitting: Nephrology

## 2011-03-01 LAB — IRON AND TIBC
Iron: 41 ug/dL — ABNORMAL LOW (ref 42–135)
Saturation Ratios: 14 % — ABNORMAL LOW (ref 20–55)
TIBC: 293 ug/dL (ref 250–470)
UIBC: 252 ug/dL (ref 125–400)

## 2011-03-01 LAB — POCT HEMOGLOBIN-HEMACUE: Hemoglobin: 11.5 g/dL — ABNORMAL LOW (ref 12.0–15.0)

## 2011-03-06 ENCOUNTER — Other Ambulatory Visit: Payer: Self-pay | Admitting: Diagnostic Radiology

## 2011-03-06 ENCOUNTER — Ambulatory Visit (HOSPITAL_COMMUNITY): Payer: Medicare Other

## 2011-03-06 ENCOUNTER — Other Ambulatory Visit: Payer: Self-pay | Admitting: Internal Medicine

## 2011-03-06 ENCOUNTER — Ambulatory Visit (HOSPITAL_COMMUNITY)
Admission: RE | Admit: 2011-03-06 | Discharge: 2011-03-06 | Disposition: A | Discharge status: Home / Self Care | Payer: Medicare Other | Source: Ambulatory Visit | Attending: Internal Medicine | Admitting: Internal Medicine

## 2011-03-06 DIAGNOSIS — C9000 Multiple myeloma not having achieved remission: Secondary | ICD-10-CM | POA: Insufficient documentation

## 2011-03-06 DIAGNOSIS — J45909 Unspecified asthma, uncomplicated: Secondary | ICD-10-CM | POA: Insufficient documentation

## 2011-03-06 DIAGNOSIS — D649 Anemia, unspecified: Secondary | ICD-10-CM | POA: Insufficient documentation

## 2011-03-06 DIAGNOSIS — I129 Hypertensive chronic kidney disease with stage 1 through stage 4 chronic kidney disease, or unspecified chronic kidney disease: Secondary | ICD-10-CM | POA: Insufficient documentation

## 2011-03-06 DIAGNOSIS — M1990 Unspecified osteoarthritis, unspecified site: Secondary | ICD-10-CM | POA: Insufficient documentation

## 2011-03-06 DIAGNOSIS — Z86711 Personal history of pulmonary embolism: Secondary | ICD-10-CM | POA: Insufficient documentation

## 2011-03-06 DIAGNOSIS — E785 Hyperlipidemia, unspecified: Secondary | ICD-10-CM | POA: Insufficient documentation

## 2011-03-06 DIAGNOSIS — N189 Chronic kidney disease, unspecified: Secondary | ICD-10-CM | POA: Insufficient documentation

## 2011-03-06 DIAGNOSIS — E119 Type 2 diabetes mellitus without complications: Secondary | ICD-10-CM | POA: Insufficient documentation

## 2011-03-06 DIAGNOSIS — Z79899 Other long term (current) drug therapy: Secondary | ICD-10-CM | POA: Insufficient documentation

## 2011-03-06 LAB — CBC
HCT: 38.9 % (ref 36.0–46.0)
MCHC: 32.4 g/dL (ref 30.0–36.0)
MCV: 88 fL (ref 78.0–100.0)
RDW: 17.5 % — ABNORMAL HIGH (ref 11.5–15.5)

## 2011-03-06 LAB — BONE MARROW EXAM: Bone Marrow Exam: 734

## 2011-03-06 LAB — GLUCOSE, CAPILLARY: Glucose-Capillary: 136 mg/dL — ABNORMAL HIGH (ref 70–99)

## 2011-03-09 LAB — DIFFERENTIAL
Lymphocytes Relative: 26 % (ref 12–46)
Lymphs Abs: 1.5 10*3/uL (ref 0.7–4.0)
Monocytes Relative: 8 % (ref 3–12)
Neutrophils Relative %: 63 % (ref 43–77)

## 2011-03-09 LAB — URINALYSIS, ROUTINE W REFLEX MICROSCOPIC
Glucose, UA: NEGATIVE mg/dL
Hgb urine dipstick: NEGATIVE
Specific Gravity, Urine: 1.01 (ref 1.005–1.030)
pH: 5 (ref 5.0–8.0)

## 2011-03-09 LAB — CBC
MCV: 92.6 fL (ref 78.0–100.0)
Platelets: 270 10*3/uL (ref 150–400)
RBC: 2.85 MIL/uL — ABNORMAL LOW (ref 3.87–5.11)
WBC: 5.9 10*3/uL (ref 4.0–10.5)

## 2011-03-13 LAB — CHROMOSOME ANALYSIS, BONE MARROW

## 2011-03-14 ENCOUNTER — Encounter (HOSPITAL_COMMUNITY)
Admission: RE | Admit: 2011-03-14 | Discharge: 2011-03-14 | Disposition: A | Discharge status: Home / Self Care | Payer: Medicare Other | Source: Ambulatory Visit | Attending: Nephrology | Admitting: Nephrology

## 2011-03-14 ENCOUNTER — Other Ambulatory Visit: Payer: Self-pay | Admitting: Nephrology

## 2011-03-14 DIAGNOSIS — D649 Anemia, unspecified: Secondary | ICD-10-CM | POA: Insufficient documentation

## 2011-03-14 DIAGNOSIS — N186 End stage renal disease: Secondary | ICD-10-CM | POA: Insufficient documentation

## 2011-03-14 DIAGNOSIS — I120 Hypertensive chronic kidney disease with stage 5 chronic kidney disease or end stage renal disease: Secondary | ICD-10-CM | POA: Insufficient documentation

## 2011-03-14 LAB — POCT HEMOGLOBIN-HEMACUE: Hemoglobin: 13.6 g/dL (ref 12.0–15.0)

## 2011-03-20 ENCOUNTER — Encounter (HOSPITAL_BASED_OUTPATIENT_CLINIC_OR_DEPARTMENT_OTHER): Payer: Medicare Other | Admitting: Internal Medicine

## 2011-03-20 DIAGNOSIS — D472 Monoclonal gammopathy: Secondary | ICD-10-CM

## 2011-03-28 ENCOUNTER — Encounter (HOSPITAL_COMMUNITY): Payer: Medicare Other

## 2011-04-16 ENCOUNTER — Emergency Department (HOSPITAL_COMMUNITY): Payer: Medicare Other

## 2011-04-16 ENCOUNTER — Other Ambulatory Visit: Payer: Self-pay

## 2011-04-16 ENCOUNTER — Emergency Department (HOSPITAL_COMMUNITY)
Admission: EM | Admit: 2011-04-16 | Discharge: 2011-04-17 | Disposition: A | Discharge status: Home / Self Care | Payer: Medicare Other | Attending: Emergency Medicine | Admitting: Emergency Medicine

## 2011-04-16 ENCOUNTER — Encounter: Payer: Self-pay | Admitting: Emergency Medicine

## 2011-04-16 DIAGNOSIS — I120 Hypertensive chronic kidney disease with stage 5 chronic kidney disease or end stage renal disease: Secondary | ICD-10-CM | POA: Insufficient documentation

## 2011-04-16 DIAGNOSIS — R0789 Other chest pain: Secondary | ICD-10-CM | POA: Insufficient documentation

## 2011-04-16 DIAGNOSIS — N186 End stage renal disease: Secondary | ICD-10-CM | POA: Insufficient documentation

## 2011-04-16 DIAGNOSIS — E119 Type 2 diabetes mellitus without complications: Secondary | ICD-10-CM | POA: Insufficient documentation

## 2011-04-16 HISTORY — DX: Essential (primary) hypertension: I10

## 2011-04-16 LAB — CBC
HCT: 34 % — ABNORMAL LOW (ref 36.0–46.0)
Hemoglobin: 11.3 g/dL — ABNORMAL LOW (ref 12.0–15.0)
MCH: 27.8 pg (ref 26.0–34.0)
MCHC: 33.2 g/dL (ref 30.0–36.0)
RDW: 17.6 % — ABNORMAL HIGH (ref 11.5–15.5)

## 2011-04-16 LAB — BASIC METABOLIC PANEL
BUN: 85 mg/dL — ABNORMAL HIGH (ref 6–23)
Calcium: 9.5 mg/dL (ref 8.4–10.5)
Creatinine, Ser: 3.7 mg/dL — ABNORMAL HIGH (ref 0.50–1.10)
GFR calc Af Amer: 13 mL/min — ABNORMAL LOW (ref >90)
GFR calc non Af Amer: 11 mL/min — ABNORMAL LOW (ref >90)

## 2011-04-16 LAB — DIFFERENTIAL
Basophils Absolute: 0 10*3/uL (ref 0.0–0.1)
Basophils Relative: 0 % (ref 0–1)
Eosinophils Absolute: 0.1 10*3/uL (ref 0.0–0.7)
Eosinophils Relative: 2 % (ref 0–5)
Monocytes Absolute: 0.5 10*3/uL (ref 0.1–1.0)
Monocytes Relative: 9 % (ref 3–12)

## 2011-04-16 LAB — TROPONIN I: Troponin I: <0.3 ng/mL (ref <0.30)

## 2011-04-16 NOTE — ED Notes (Signed)
PT. REPORTS LEFT CHEST PAIN ONSET 2 DAYS AGO , NO SOB , NO NAUSEA OR VOMITTING .DENIES INJURY OR FALL.

## 2011-04-17 MED ORDER — HYDROMORPHONE HCL PF 2 MG/ML IJ SOLN
2.0000 mg | Freq: Once | INTRAMUSCULAR | Status: AC
  Administered 2011-04-17: 2 mg via INTRAMUSCULAR
  Filled 2011-04-17: qty 1

## 2011-04-17 NOTE — ED Provider Notes (Signed)
History     CSN: [id number] Arrival date & time: 04/16/2011  8:11 PM   First MD Initiated Contact with Patient 04/16/11 2330      Chief Complaint  Patient presents with  . Chest Pain    (Consider location/radiation/quality/duration/timing/severity/associated sxs/prior treatment) Patient is a 77 y.o. female presenting with chest pain. The history is provided by the patient.  Chest Pain    is reports constant left-sided chest pain that is worse with movement and palpation that started yesterday.  Her pain has been unrelieved by her hydrocodone and her OxyContin at home.  She has no shortness of breath is no nausea or vomiting he denies recent injury.  Her pain is constant.  It is severe.  It is worsened by palpation.  She has no diaphoresis she has no exertional component to her discomfort.  No prior history of DVT or PE denies recent surgery or long travel.  Denies unilateral leg swelling.  Past Medical History  Diagnosis Date  . Renal failure   . Hypertension   . Diabetes mellitus     Past Surgical History  Procedure Date  . Lobectomy   . Cholecystectomy     No family history on file.  History  Substance Use Topics  . Smoking status: Never Smoker   . Smokeless tobacco: Not on file  . Alcohol Use: No    OB History    Grav Para Term Preterm Abortions TAB SAB Ect Mult Living                  Review of Systems  Cardiovascular: Positive for chest pain.  All other systems reviewed and are negative.    Allergies  Penicillins cross reactors  Home Medications   Current Outpatient Rx  Name Route Sig Dispense Refill  . ALBUTEROL SULFATE HFA 108 (90 BASE) MCG/ACT IN AERS Inhalation Inhale 2 puffs into the lungs 2 (two) times daily.      . ALLOPURINOL 100 MG PO TABS Oral Take 100 mg by mouth daily.      . CYCLOBENZAPRINE HCL 10 MG PO TABS Oral Take 10 mg by mouth at bedtime.      Marland Kitchen FERROUS SULFATE 325 (65 FE) MG PO TBEC Oral Take 650 mg by mouth daily with  breakfast.      . FUROSEMIDE 80 MG PO TABS Oral Take 80 mg by mouth daily.      Marland Kitchen GABAPENTIN 100 MG PO CAPS Oral Take 100 mg by mouth at bedtime.      Marland Kitchen GLIMEPIRIDE 1 MG PO TABS Oral Take 1 mg by mouth daily before breakfast.      . HYDROCODONE-ACETAMINOPHEN 7.5-325 MG PO TABS Oral Take 1 tablet by mouth daily as needed. For pain     . HYDROXYZINE HCL 25 MG PO TABS Oral Take 25 mg by mouth every 8 (eight) hours as needed. For itching     . RENA-VITE PO TABS Oral Take 1 tablet by mouth daily.      . OXYCODONE HCL 40 MG PO TB12 Oral Take 40 mg by mouth every 12 (twelve) hours. For pain     . PANTOPRAZOLE SODIUM 40 MG PO TBEC Oral Take 40 mg by mouth 2 (two) times daily.      Marland Kitchen PIOGLITAZONE HCL 30 MG PO TABS Oral Take 30 mg by mouth daily.        BP 174/75  Pulse 60  Temp(Src) 97.9 F (36.6 C) (Oral)  Resp 16  SpO2 100%  Physical Exam  Nursing note and vitals reviewed. Constitutional: She is oriented to person, place, and time. She appears well-developed and well-nourished. No distress.  HENT:  Head: Normocephalic and atraumatic.  Eyes: EOM are normal.  Neck: Normal range of motion.  Cardiovascular: Normal rate, regular rhythm and normal heart sounds.   Pulmonary/Chest: Effort normal and breath sounds normal.       Tenderness of left anterior chest wall without evidence of rash.  There is no deformity of her left chest.  There is no flail chest.  Her tenderness lies along the ribs 9 and 10  Abdominal: Soft. She exhibits no distension. There is no tenderness.  Musculoskeletal: Normal range of motion.  Neurological: She is alert and oriented to person, place, and time.  Skin: Skin is warm and dry.  Psychiatric: She has a normal mood and affect. Judgment normal.    ED Course  Procedures (including critical care time)   Date: 04/17/2011  Rate:67  Rhythm: normal sinus rhythm  QRS Axis: normal  Intervals: normal  ST/T Wave abnormalities: normal  Conduction Disutrbances:none   Narrative Interpretation:   Old EKG Reviewed: none available   Labs Reviewed  CBC - Abnormal; Notable for the following:    Hemoglobin 11.3 (*)    HCT 34.0 (*)    RDW 17.6 (*)    All other components within normal limits  BASIC METABOLIC PANEL - Abnormal; Notable for the following:    BUN 85 (*)    Creatinine, Ser 3.70 (*)    GFR calc non Af Amer 11 (*)    GFR calc Af Amer 13 (*)    All other components within normal limits  TROPONIN I  DIFFERENTIAL   Dg Chest 2 View  04/16/2011  *RADIOLOGY REPORT*  Clinical Data: Left-sided chest pain; history of left lower lobectomy.  History of diabetes.  CHEST - 2 VIEW  Comparison: Chest radiograph performed 09/18/2010  Findings: The lungs are well-aerated.  Mild left basilar scarring and pleural thickening are unchanged in appearance.  The lungs otherwise appear clear.  No pleural effusion or pneumothorax is identified.  The heart is borderline normal in size; the mediastinal contour is within normal limits.  No acute osseous abnormalities are seen. Clips are noted within the right upper quadrant, reflecting prior cholecystectomy.  IMPRESSION: Mild left basilar scarring and pleural thickening, reflecting postoperative change; lungs otherwise clear.  Original Report Authenticated By: Tonia Ghent, M.D.     1. Chest pain       MDM  Pain is reproducible with tenderness of the anterior and lateral ninth and 10th ribs.  Chest x-ray without acute pathology.  EKG is normal initial set of troponins normal.  My suspicion for pulmonary and wasn't very low.  Vital signs are normal O2 sats are 97% on room air.  The patient reports improvement in her pain with a lot of.  She's been encouraged to followup with her primary care Dr. tomorrow for repeat evaluation and changing of her pain medicines as he or she sees fit.         Lyanne Co, MD 04/17/11 [phone]

## 2011-04-17 NOTE — ED Notes (Signed)
The pt says her pain is better.  Daughter at the bedside

## 2011-04-17 NOTE — ED Notes (Signed)
The pt has had lt lower chest laterally and around to her posterior chest just under her lt breast.  Worse for the past few days.  No rash.  The pain is very severe with movement.  She had a lobectomy on that side 1980s.

## 2011-04-17 NOTE — ED Notes (Signed)
Pt walked to the br .  Pain med given for pain

## 2011-08-05 ENCOUNTER — Emergency Department (HOSPITAL_COMMUNITY)
Admission: EM | Admit: 2011-08-05 | Discharge: 2011-08-05 | Disposition: A | Discharge status: Home / Self Care | Payer: Medicare Other | Attending: Emergency Medicine | Admitting: Emergency Medicine

## 2011-08-05 ENCOUNTER — Encounter (HOSPITAL_COMMUNITY): Payer: Self-pay | Admitting: *Deleted

## 2011-08-05 ENCOUNTER — Emergency Department (HOSPITAL_COMMUNITY): Payer: Medicare Other

## 2011-08-05 DIAGNOSIS — I129 Hypertensive chronic kidney disease with stage 1 through stage 4 chronic kidney disease, or unspecified chronic kidney disease: Secondary | ICD-10-CM | POA: Insufficient documentation

## 2011-08-05 DIAGNOSIS — M25559 Pain in unspecified hip: Secondary | ICD-10-CM | POA: Insufficient documentation

## 2011-08-05 DIAGNOSIS — M1990 Unspecified osteoarthritis, unspecified site: Secondary | ICD-10-CM

## 2011-08-05 DIAGNOSIS — M1710 Unilateral primary osteoarthritis, unspecified knee: Secondary | ICD-10-CM | POA: Insufficient documentation

## 2011-08-05 DIAGNOSIS — Z79899 Other long term (current) drug therapy: Secondary | ICD-10-CM | POA: Insufficient documentation

## 2011-08-05 DIAGNOSIS — M25562 Pain in left knee: Secondary | ICD-10-CM

## 2011-08-05 DIAGNOSIS — N189 Chronic kidney disease, unspecified: Secondary | ICD-10-CM | POA: Insufficient documentation

## 2011-08-05 DIAGNOSIS — E119 Type 2 diabetes mellitus without complications: Secondary | ICD-10-CM | POA: Insufficient documentation

## 2011-08-05 DIAGNOSIS — E669 Obesity, unspecified: Secondary | ICD-10-CM | POA: Insufficient documentation

## 2011-08-05 DIAGNOSIS — M79609 Pain in unspecified limb: Secondary | ICD-10-CM | POA: Insufficient documentation

## 2011-08-05 DIAGNOSIS — M25569 Pain in unspecified knee: Secondary | ICD-10-CM | POA: Insufficient documentation

## 2011-08-05 HISTORY — DX: Unspecified osteoarthritis, unspecified site: M19.90

## 2011-08-05 NOTE — ED Provider Notes (Signed)
History     CSN: [id number]  Arrival date & time 08/05/11  1309   First MD Initiated Contact with Patient 08/05/11 1356      Chief Complaint  Patient presents with  . Knee Pain    left    (Consider location/radiation/quality/duration/timing/severity/associated sxs/prior treatment) HPI  78 year old female with history of arthritis is presenting to the ED with a chief complaints of worsening left knee pain for the past 2 weeks. Patient described pain as a sharp and shooting sensation sometimes radiating up the thigh and sometimes radiating down to her leg. Pain is worsened with movement and with walking. Pain improves with rest. States she feels that it has been swollen. She denies any recent surgery or recent injury. She has been taking her pain medication at home without adequate relief. She denies fever, chest pain, or shortness of breath. Patient denies rash  Past Medical History  Diagnosis Date  . Renal failure   . Hypertension   . Diabetes mellitus   . Arthritis     Past Surgical History  Procedure Date  . Lobectomy   . Cholecystectomy   . Appendectomy   . Tubal ligation     History reviewed. No pertinent family history.  History  Substance Use Topics  . Smoking status: Never Smoker   . Smokeless tobacco: Never Used  . Alcohol Use: No    OB History    Grav Para Term Preterm Abortions TAB SAB Ect Mult Living                  Review of Systems  All other systems reviewed and are negative.    Allergies  Penicillins cross reactors  Home Medications   Current Outpatient Rx  Name Route Sig Dispense Refill  . ALBUTEROL SULFATE HFA 108 (90 BASE) MCG/ACT IN AERS Inhalation Inhale 2 puffs into the lungs 2 (two) times daily.      . ALLOPURINOL 100 MG PO TABS Oral Take 100 mg by mouth daily.      . CYCLOBENZAPRINE HCL 10 MG PO TABS Oral Take 10 mg by mouth at bedtime.      Marland Kitchen FERROUS SULFATE 325 (65 FE) MG PO TBEC Oral Take 650 mg by mouth daily with breakfast.       . FUROSEMIDE 80 MG PO TABS Oral Take 80 mg by mouth daily.      Marland Kitchen GABAPENTIN 100 MG PO CAPS Oral Take 100 mg by mouth at bedtime.      Marland Kitchen GLIMEPIRIDE 1 MG PO TABS Oral Take 1 mg by mouth daily before breakfast.      . HYDROCODONE-ACETAMINOPHEN 7.5-325 MG PO TABS Oral Take 1 tablet by mouth daily as needed. For pain     . HYDROXYZINE HCL 25 MG PO TABS Oral Take 25 mg by mouth every 8 (eight) hours as needed. For itching     . RENA-VITE PO TABS Oral Take 1 tablet by mouth daily.      . OXYCODONE HCL ER 40 MG PO TB12 Oral Take 40 mg by mouth every 12 (twelve) hours. For pain     . PANTOPRAZOLE SODIUM 40 MG PO TBEC Oral Take 40 mg by mouth 2 (two) times daily.      Marland Kitchen PIOGLITAZONE HCL 30 MG PO TABS Oral Take 30 mg by mouth daily.        BP 138/48  Pulse 75  Temp(Src) 98.3 F (36.8 C) (Oral)  Resp 16  Wt 225 lb (102.059 kg)  SpO2 99%  Physical Exam  Nursing note and vitals reviewed. Constitutional: She appears well-developed and well-nourished.       Obese  HENT:  Head: Atraumatic.  Eyes: Conjunctivae are normal.  Neck: Neck supple.  Abdominal: Soft.  Musculoskeletal:       Left hip: She exhibits decreased range of motion. She exhibits normal strength, no tenderness, no bony tenderness and no crepitus.       Left knee: She exhibits decreased range of motion. She exhibits no swelling, no effusion and no deformity. tenderness found.       Left ankle: Normal.       Left knee is moderately tender on palpation without overlying skin changes no effusion noted. Difficult to fully evaluate patient and patient's habitus and positioning. No obvious signs of infection. No crepitus noted. Sensation is intact throughout.    ED Course  Procedures (including critical care time)  Labs Reviewed - No data to display No results found.   No diagnosis found.  Dg Knee Complete 4 Views Left  08/05/2011  *RADIOLOGY REPORT*  Clinical Data: Left knee pain  LEFT KNEE - COMPLETE 4+ VIEW  Comparison:  None.  Findings: No fracture or dislocation of the left knee.  Small joint effusion.  There is mild joint space narrowing of the medial lateral compartment  IMPRESSION:  1.  No acute findings of the knee. 2.  Mild osteoarthritis.  Original Report Authenticated By: Genevive Bi, M.D.      MDM  Pain is likely arthritis. She denies any shortness of breath, recent surgery, or prolonged bed rest. Low suspicion for DVT.  Low suspicion for joint infection.    Patient has normal stable vital signs. Afebrile, with normal heart rate.   3:01 PM Xray of L knee shows no acute finding, some evidence of mild osteoarthritis which may accounts for her pain.  Time were spent discussed care, including weight loss, massage, RICE, water therapy.  Will refer to ortho.     Fayrene Helper, PA-C 08/05/11 1503

## 2011-08-05 NOTE — Discharge Instructions (Signed)
Degenerative Arthritis You have osteoarthritis. This is the wear and tear arthritis that comes with aging. It is also called degenerative arthritis. This is common in people past middle age. It is caused by stress on the joints. The large weight bearing joints of the lower extremities are most often affected. The knees, hips, back, neck, and hands can become painful, swollen, and stiff. This is the most common type of arthritis. It comes on with age, carrying too much weight, or from an injury. Treatment includes resting the sore joint until the pain and swelling improve. Crutches or a walker may be needed for severe flares. Only take over-the-counter or prescription medicines for pain, discomfort, or fever as directed by your caregiver. Local heat therapy may improve motion. Cortisone shots into the joint are sometimes used to reduce pain and swelling during flares. Osteoarthritis is usually not crippling and progresses slowly. There are things you can do to decrease pain:  Avoid high impact activities.   Exercise regularly.   Low impact exercises such as walking, biking and swimming help to keep the muscles strong and keep normal joint function.   Stretching helps to keep your range of motion.   Lose weight if you are overweight. This reduces joint stress.  In severe cases when you have pain at rest or increasing disability, joint surgery may be helpful. See your caregiver for follow-up treatment as recommended.  SEEK IMMEDIATE MEDICAL CARE IF:   You have severe joint pain.   Marked swelling and redness in your joint develops.   You develop a high fever.  Document Released: 05/21/2005 Document Revised: 05/10/2011 Document Reviewed: 10/21/2006 [facility] Patient Information 2012 [place], [place].  Knee Pain The knee is the complex joint between your thigh and your lower leg. It is made up of bones, tendons, ligaments, and cartilage. The bones that make up the knee are:  The femur in the  thigh.   The tibia and fibula in the lower leg.   The patella or kneecap riding in the groove on the lower femur.  CAUSES  Knee pain is a common complaint with many causes. A few of these causes are:  Injury, such as:   A ruptured ligament or tendon injury.   Torn cartilage.   Medical conditions, such as:   Gout   Arthritis   Infections   Overuse, over training or overdoing a physical activity.  Knee pain can be minor or severe. Knee pain can accompany debilitating injury. Minor knee problems often respond well to self-care measures or get well on their own. More serious injuries may need medical intervention or even surgery. SYMPTOMS The knee is complex. Symptoms of knee problems can vary widely. Some of the problems are:  Pain with movement and weight bearing.   Swelling and tenderness.   Buckling of the knee.   Inability to straighten or extend your knee.   Your knee locks and you cannot straighten it.   Warmth and redness with pain and fever.   Deformity or dislocation of the kneecap.  DIAGNOSIS  Determining what is wrong may be very straight forward such as when there is an injury. It can also be challenging because of the complexity of the knee. Tests to make a diagnosis may include:  Your caregiver taking a history and doing a physical exam.   Routine X-rays can be used to rule out other problems. X-rays will not reveal a cartilage tear. Some injuries of the knee can be diagnosed by:   Arthroscopy  a surgical technique by which a small video camera is inserted through tiny incisions on the sides of the knee. This procedure is used to examine and repair internal knee joint problems. Tiny instruments can be used during arthroscopy to repair the torn knee cartilage (meniscus).   Arthrography is a radiology technique. A contrast liquid is directly injected into the knee joint. Internal structures of the knee joint then become visible on X-ray film.   An MRI scan  is a non x-ray radiology procedure in which magnetic fields and a computer produce two- or three-dimensional images of the inside of the knee. Cartilage tears are often visible using an MRI scanner. MRI scans have largely replaced arthrography in diagnosing cartilage tears of the knee.   Blood work.   Examination of the fluid that helps to lubricate the knee joint (synovial fluid). This is done by taking a sample out using a needle and a syringe.  TREATMENT The treatment of knee problems depends on the cause. Some of these treatments are:  Depending on the injury, proper casting, splinting, surgery or physical therapy care will be needed.   Give yourself adequate recovery time. Do not overuse your joints. If you begin to get sore during workout routines, back off. Slow down or do fewer repetitions.   For repetitive activities such as cycling or running, maintain your strength and nutrition.   Alternate muscle groups. For example if you are a weight lifter, work the upper body on one day and the lower body the next.   Either tight or weak muscles do not give the proper support for your knee. Tight or weak muscles do not absorb the stress placed on the knee joint. Keep the muscles surrounding the knee strong.   Take care of mechanical problems.   If you have flat feet, orthotics or special shoes may help. See your caregiver if you need help.   Arch supports, sometimes with wedges on the inner or outer aspect of the heel, can help. These can shift pressure away from the side of the knee most bothered by osteoarthritis.   A brace called an "unloader" brace also may be used to help ease the pressure on the most arthritic side of the knee.   If your caregiver has prescribed crutches, braces, wraps or ice, use as directed. The acronym for this is PRICE. This means protection, rest, ice, compression and elevation.   Nonsteroidal anti-inflammatory drugs (NSAID's), can help relieve pain. But if  taken immediately after an injury, they may actually increase swelling. Take NSAID's with food in your stomach. Stop them if you develop stomach problems. Do not take these if you have a history of ulcers, stomach pain or bleeding from the bowel. Do not take without your caregiver's approval if you have problems with fluid retention, heart failure, or kidney problems.   For ongoing knee problems, physical therapy may be helpful.   Glucosamine and chondroitin are over-the-counter dietary supplements. Both may help relieve the pain of osteoarthritis in the knee. These medicines are different from the usual anti-inflammatory drugs. Glucosamine may decrease the rate of cartilage destruction.   Injections of a corticosteroid drug into your knee joint may help reduce the symptoms of an arthritis flare-up. They may provide pain relief that lasts a few months. You may have to wait a few months between injections. The injections do have a small increased risk of infection, water retention and elevated blood sugar levels.   Hyaluronic acid injected into damaged joints  may ease pain and provide lubrication. These injections may work by reducing inflammation. A series of shots may give relief for as long as 6 months.   Topical painkillers. Applying certain ointments to your skin may help relieve the pain and stiffness of osteoarthritis. Ask your pharmacist for suggestions. Many over the-counter products are approved for temporary relief of arthritis pain.   In some countries, doctors often prescribe topical NSAID's for relief of chronic conditions such as arthritis and tendinitis. A review of treatment with NSAID creams found that they worked as well as oral medications but without the serious side effects.  PREVENTION  Maintain a healthy weight. Extra pounds put more strain on your joints.   Get strong, stay limber. Weak muscles are a common cause of knee injuries. Stretching is important. Include flexibility  exercises in your workouts.   Be smart about exercise. If you have osteoarthritis, chronic knee pain or recurring injuries, you may need to change the way you exercise. This does not mean you have to stop being active. If your knees ache after jogging or playing basketball, consider switching to swimming, water aerobics or other low-impact activities, at least for a few days a week. Sometimes limiting high-impact activities will provide relief.   Make sure your shoes fit well. Choose footwear that is right for your sport.   Protect your knees. Use the proper gear for knee-sensitive activities. Use kneepads when playing volleyball or laying carpet. Buckle your seat belt every time you drive. Most shattered kneecaps occur in car accidents.   Rest when you are tired.  SEEK MEDICAL CARE IF:  You have knee pain that is continual and does not seem to be getting better.  SEEK IMMEDIATE MEDICAL CARE IF:  Your knee joint feels hot to the touch and you have a high fever. MAKE SURE YOU:   Understand these instructions.   Will watch your condition.   Will get help right away if you are not doing well or get worse.  Document Released: 03/18/2007 Document Revised: 05/10/2011 Document Reviewed: 03/18/2007 [facility] Patient Information 2012 [place], [place].

## 2011-08-05 NOTE — ED Notes (Signed)
Pt from home with reports of worsening left knee pain and swelling over the last 11/2 weeks. Pt denies injury or recent surgery but has been diagnosed with arthritis. Pt reports pain starts from behind left knee and radiates in to left groin.

## 2011-08-06 NOTE — ED Provider Notes (Signed)
Medical screening examination/treatment/procedure(s) were performed by non-physician practitioner and as supervising physician I was immediately available for consultation/collaboration.  Teosha Casso L Dontavia Brand, MD 08/06/11 0955 

## 2011-09-18 ENCOUNTER — Telehealth: Payer: Self-pay | Admitting: Internal Medicine

## 2011-09-18 ENCOUNTER — Ambulatory Visit (HOSPITAL_BASED_OUTPATIENT_CLINIC_OR_DEPARTMENT_OTHER): Payer: Medicare Other | Admitting: Internal Medicine

## 2011-09-18 ENCOUNTER — Other Ambulatory Visit (HOSPITAL_BASED_OUTPATIENT_CLINIC_OR_DEPARTMENT_OTHER): Payer: Medicare Other | Admitting: Lab

## 2011-09-18 VITALS — BP 135/64 | HR 70 | Temp 97.3°F | Ht 65.0 in | Wt 224.3 lb

## 2011-09-18 DIAGNOSIS — D472 Monoclonal gammopathy: Secondary | ICD-10-CM

## 2011-09-18 DIAGNOSIS — D892 Hypergammaglobulinemia, unspecified: Secondary | ICD-10-CM

## 2011-09-18 LAB — CBC WITH DIFFERENTIAL/PLATELET
BASO%: 0.9 % (ref 0.0–2.0)
EOS%: 2.6 % (ref 0.0–7.0)
HCT: 25.1 % — ABNORMAL LOW (ref 34.8–46.6)
MCH: 32.5 pg (ref 25.1–34.0)
MCHC: 34.2 g/dL (ref 31.5–36.0)
NEUT%: 64.8 % (ref 38.4–76.8)
RBC: 2.64 10*6/uL — ABNORMAL LOW (ref 3.70–5.45)
RDW: 14.4 % (ref 11.2–14.5)
WBC: 6.4 10*3/uL (ref 3.9–10.3)
lymph#: 1.6 10*3/uL (ref 0.9–3.3)
nRBC: 0 % (ref 0–0)

## 2011-09-18 NOTE — Progress Notes (Signed)
[facility] [phone]   Fax:([phone]  OFFICE PROGRESS NOTE  Eino Farber, MD, MD Individual [address] [place] [address]  DIAGNOSIS:  #1 monoclonal gammopathy of undetermined significance (MGUS). #2 anemia of chronic disease secondary to chronic kidney disease.  CURRENT THERAPY: Observation.  INTERVAL HISTORY: [name] 78 y.o. female returns to the clinic today for six-month followup visit. The patient is feeling fine today with no specific complaints except for arthritis of the left knee. She is currently on over the counter ferrous sulfate 2 tablets per day as prescribed by Dr. Lowell Guitar. She has been on treatment with Aranesp every 2 weeks under the care of Dr. Lowell Guitar but this was discontinued several months ago because of normalization of her hemoglobin and hematocrit. Patient otherwise feeling fine today. She has mild fatigue but no dizzy spells. She has repeat CBC, comprehensive metabolic panel, LDH and myeloma panel performed earlier today and she is here for evaluation and discussion of her lab results.  MEDICAL HISTORY: Past Medical History  Diagnosis Date  . Renal failure   . Hypertension   . Diabetes mellitus   . Arthritis     ALLERGIES:  is allergic to penicillins cross reactors.  MEDICATIONS:  Current Outpatient Prescriptions  Medication Sig Dispense Refill  . albuterol (PROVENTIL HFA;VENTOLIN HFA) 108 (90 BASE) MCG/ACT inhaler Inhale 2 puffs into the lungs 2 (two) times daily.        Marland Kitchen allopurinol (ZYLOPRIM) 100 MG tablet Take 100 mg by mouth daily.        Marland Kitchen amLODipine (NORVASC) 5 MG tablet Take 5 mg by mouth daily.      . cyclobenzaprine (FLEXERIL) 10 MG tablet Take 10 mg by mouth at bedtime.        . ferrous sulfate 325 (65 FE) MG EC tablet Take 650 mg by mouth daily with breakfast.        . furosemide (LASIX) 80 MG tablet Take 80 mg by mouth daily.        Marland Kitchen gabapentin (NEURONTIN) 100 MG  capsule Take 100 mg by mouth at bedtime.        Marland Kitchen glimepiride (AMARYL) 1 MG tablet Take 1 mg by mouth daily before breakfast.        . HYDROcodone-acetaminophen (NORCO) 7.5-325 MG per tablet Take 1 tablet by mouth daily as needed. For pain       . hydrOXYzine (ATARAX/VISTARIL) 25 MG tablet Take 25 mg by mouth every 8 (eight) hours as needed. For itching       . multivitamin (RENA-VIT) TABS tablet Take 1 tablet by mouth daily.        Marland Kitchen oxyCODONE (OXYCONTIN) 10 MG 12 hr tablet Take 10 mg by mouth every 12 (twelve) hours.      . pantoprazole (PROTONIX) 40 MG tablet Take 40 mg by mouth 2 (two) times daily.        . pioglitazone (ACTOS) 30 MG tablet Take 30 mg by mouth daily.          SURGICAL HISTORY:  Past Surgical History  Procedure Date  . Lobectomy   . Cholecystectomy   . Appendectomy   . Tubal ligation     REVIEW OF SYSTEMS:  A comprehensive review of systems was negative except for: Constitutional: positive for fatigue   PHYSICAL EXAMINATION: General appearance: alert, cooperative and no distress Neck: no adenopathy Lymph nodes: Cervical, supraclavicular, and axillary nodes normal. Resp: clear to auscultation  bilaterally Cardio: regular rate and rhythm, S1, S2 normal, no murmur, click, rub or gallop GI: soft, non-tender; bowel sounds normal; no masses,  no organomegaly Extremities: extremities normal, atraumatic, no cyanosis or edema  ECOG PERFORMANCE STATUS: 1 - Symptomatic but completely ambulatory  Blood pressure 135/64, pulse 70, temperature 97.3 F (36.3 C), temperature source Oral, height 5\' 5"  (1.651 m), weight 224 lb 4.8 oz (101.742 kg).  LABORATORY DATA: Lab Results  Component Value Date   WBC 6.4 09/18/2011   HGB 8.6* 09/18/2011   HCT 25.1* 09/18/2011   MCV 95.1 09/18/2011   PLT 250 09/18/2011      Chemistry      Component Value Date/Time   NA 136 04/16/2011 2119   K 4.8 04/16/2011 2119   CL 99 04/16/2011 2119   CO2 25 04/16/2011 2119   BUN 85* 04/16/2011  2119   CREATININE 3.70* 04/16/2011 2119      Component Value Date/Time   CALCIUM 9.5 04/16/2011 2119   ALKPHOS 67 02/27/2011 0944   ALKPHOS 67 02/27/2011 0944   ALKPHOS 67 02/27/2011 0944   ALKPHOS 67 02/27/2011 0944   ALKPHOS 67 02/27/2011 0944   ALKPHOS 67 02/27/2011 0944   AST 21 02/27/2011 0944   AST 21 02/27/2011 0944   AST 21 02/27/2011 0944   AST 21 02/27/2011 0944   AST 21 02/27/2011 0944   AST 21 02/27/2011 0944   ALT 15 02/27/2011 0944   ALT 15 02/27/2011 0944   ALT 15 02/27/2011 0944   ALT 15 02/27/2011 0944   ALT 15 02/27/2011 0944   ALT 15 02/27/2011 0944   BILITOT 0.4 02/27/2011 0944   BILITOT 0.4 02/27/2011 0944   BILITOT 0.4 02/27/2011 0944   BILITOT 0.4 02/27/2011 0944   BILITOT 0.4 02/27/2011 0944   BILITOT 0.4 02/27/2011 0944       RADIOGRAPHIC STUDIES: No results found.  ASSESSMENT: This is a very pleasant 78 years old [place] American female with history of monoclonal pelvis as well as anemia of chronic disease. The patient is doing fine with no significant complaints except for mild fatigue most likely secondary to the anemia of chronic disease. Her myeloma panel is still pending, but she has significant decline in her hemoglobin and hematocrit.  PLAN: I will continue the patient observation for now if her myeloma panel showed no significant evidence for disease progression. I would see her back for followup visit in 6 months with repeat lab work including myeloma panel. For the anemia of chronic disease, I recommended for the patient to contact Dr. Roanna Banning office for evaluation and resuming her treatment with Aranesp. The patient was advised to call me immediately if she has any concerning symptoms in the interval.  All questions were answered. The patient knows to call the clinic with any problems, questions or concerns. We can certainly see the patient much sooner if necessary.

## 2011-09-18 NOTE — Telephone Encounter (Signed)
Gv pt appt for oct2013 °

## 2011-09-19 LAB — COMPREHENSIVE METABOLIC PANEL
ALT: 11 U/L (ref 0–35)
AST: 17 U/L (ref 0–37)
Albumin: 3.7 g/dL (ref 3.5–5.2)
Alkaline Phosphatase: 75 U/L (ref 39–117)
BUN: 73 mg/dL — ABNORMAL HIGH (ref 6–23)
Creatinine, Ser: 3.74 mg/dL — ABNORMAL HIGH (ref 0.50–1.10)
Potassium: 4.4 mEq/L (ref 3.5–5.3)

## 2011-09-19 LAB — KAPPA/LAMBDA LIGHT CHAINS
Kappa:Lambda Ratio: 0.11 — ABNORMAL LOW (ref 0.26–1.65)
Lambda Free Lght Chn: 52 mg/dL — ABNORMAL HIGH (ref 0.57–2.63)

## 2011-09-19 LAB — BETA 2 MICROGLOBULIN, SERUM: Beta-2 Microglobulin: 7.66 mg/L — ABNORMAL HIGH (ref 1.01–1.73)

## 2011-09-19 LAB — IGG, IGA, IGM: IgM, Serum: 66 mg/dL (ref 52–322)

## 2011-09-24 ENCOUNTER — Other Ambulatory Visit (HOSPITAL_COMMUNITY): Payer: Self-pay | Admitting: *Deleted

## 2011-09-25 ENCOUNTER — Encounter (HOSPITAL_COMMUNITY)
Admission: RE | Admit: 2011-09-25 | Discharge: 2011-09-25 | Disposition: A | Discharge status: Home / Self Care | Payer: Medicare Other | Source: Ambulatory Visit | Attending: Nephrology | Admitting: Nephrology

## 2011-09-25 DIAGNOSIS — D638 Anemia in other chronic diseases classified elsewhere: Secondary | ICD-10-CM | POA: Insufficient documentation

## 2011-09-25 DIAGNOSIS — N184 Chronic kidney disease, stage 4 (severe): Secondary | ICD-10-CM | POA: Insufficient documentation

## 2011-09-25 MED ORDER — DARBEPOETIN ALFA-POLYSORBATE 200 MCG/0.4ML IJ SOLN
INTRAMUSCULAR | Status: AC
  Administered 2011-09-25: 200 ug via SUBCUTANEOUS
  Filled 2011-09-25: qty 0.4

## 2011-09-25 MED ORDER — DARBEPOETIN ALFA-POLYSORBATE 200 MCG/0.4ML IJ SOLN
200.0000 ug | INTRAMUSCULAR | Status: DC
  Administered 2011-09-25: 200 ug via SUBCUTANEOUS

## 2011-10-08 ENCOUNTER — Encounter (HOSPITAL_COMMUNITY)
Admission: RE | Admit: 2011-10-08 | Discharge: 2011-10-08 | Disposition: A | Discharge status: Home / Self Care | Payer: Medicare Other | Source: Ambulatory Visit | Attending: Nephrology | Admitting: Nephrology

## 2011-10-08 DIAGNOSIS — D638 Anemia in other chronic diseases classified elsewhere: Secondary | ICD-10-CM | POA: Insufficient documentation

## 2011-10-08 DIAGNOSIS — N184 Chronic kidney disease, stage 4 (severe): Secondary | ICD-10-CM | POA: Insufficient documentation

## 2011-10-08 LAB — POCT HEMOGLOBIN-HEMACUE: Hemoglobin: 10.4 g/dL — ABNORMAL LOW (ref 12.0–15.0)

## 2011-10-08 MED ORDER — DARBEPOETIN ALFA-POLYSORBATE 200 MCG/0.4ML IJ SOLN
INTRAMUSCULAR | Status: AC
  Administered 2011-10-08: 200 ug via SUBCUTANEOUS
  Filled 2011-10-08: qty 0.4

## 2011-10-08 MED ORDER — DARBEPOETIN ALFA-POLYSORBATE 200 MCG/0.4ML IJ SOLN
200.0000 ug | INTRAMUSCULAR | Status: DC
  Administered 2011-10-08: 200 ug via SUBCUTANEOUS

## 2011-10-22 ENCOUNTER — Encounter (HOSPITAL_COMMUNITY)
Admission: RE | Admit: 2011-10-22 | Discharge: 2011-10-22 | Disposition: A | Discharge status: Home / Self Care | Payer: Medicare Other | Source: Ambulatory Visit | Attending: Nephrology | Admitting: Nephrology

## 2011-10-22 MED ORDER — DARBEPOETIN ALFA-POLYSORBATE 200 MCG/0.4ML IJ SOLN
200.0000 ug | INTRAMUSCULAR | Status: DC
  Administered 2011-10-22: 200 ug via SUBCUTANEOUS

## 2011-10-22 MED ORDER — DARBEPOETIN ALFA-POLYSORBATE 200 MCG/0.4ML IJ SOLN
INTRAMUSCULAR | Status: AC
  Filled 2011-10-22: qty 0.4

## 2011-11-05 ENCOUNTER — Encounter (HOSPITAL_COMMUNITY)
Admission: RE | Admit: 2011-11-05 | Discharge: 2011-11-05 | Disposition: A | Discharge status: Home / Self Care | Payer: Medicare Other | Source: Ambulatory Visit | Attending: Nephrology | Admitting: Nephrology

## 2011-11-05 DIAGNOSIS — N184 Chronic kidney disease, stage 4 (severe): Secondary | ICD-10-CM | POA: Insufficient documentation

## 2011-11-05 DIAGNOSIS — D638 Anemia in other chronic diseases classified elsewhere: Secondary | ICD-10-CM | POA: Insufficient documentation

## 2011-11-05 MED ORDER — DARBEPOETIN ALFA-POLYSORBATE 200 MCG/0.4ML IJ SOLN: 200.0000 ug | INTRAMUSCULAR | Status: DC

## 2011-11-19 ENCOUNTER — Encounter (HOSPITAL_COMMUNITY)
Admission: RE | Admit: 2011-11-19 | Discharge: 2011-11-19 | Disposition: A | Discharge status: Home / Self Care | Payer: Medicare Other | Source: Ambulatory Visit | Attending: Nephrology | Admitting: Nephrology

## 2011-11-19 LAB — POCT HEMOGLOBIN-HEMACUE: Hemoglobin: 11.4 g/dL — ABNORMAL LOW (ref 12.0–15.0)

## 2011-12-03 ENCOUNTER — Encounter (HOSPITAL_COMMUNITY)
Admission: RE | Admit: 2011-12-03 | Discharge: 2011-12-03 | Disposition: A | Discharge status: Home / Self Care | Payer: Medicare Other | Source: Ambulatory Visit | Attending: Nephrology | Admitting: Nephrology

## 2011-12-03 DIAGNOSIS — D638 Anemia in other chronic diseases classified elsewhere: Secondary | ICD-10-CM | POA: Insufficient documentation

## 2011-12-03 DIAGNOSIS — N184 Chronic kidney disease, stage 4 (severe): Secondary | ICD-10-CM | POA: Insufficient documentation

## 2011-12-03 MED ORDER — DARBEPOETIN ALFA-POLYSORBATE 200 MCG/0.4ML IJ SOLN: 200.0000 ug | INTRAMUSCULAR | Status: DC

## 2011-12-17 ENCOUNTER — Other Ambulatory Visit (HOSPITAL_COMMUNITY): Payer: Self-pay | Admitting: *Deleted

## 2011-12-17 ENCOUNTER — Encounter (HOSPITAL_COMMUNITY): Payer: Medicare Other

## 2011-12-18 ENCOUNTER — Encounter (HOSPITAL_COMMUNITY)
Admission: RE | Admit: 2011-12-18 | Discharge: 2011-12-18 | Disposition: A | Discharge status: Home / Self Care | Payer: Medicare Other | Source: Ambulatory Visit | Attending: Nephrology | Admitting: Nephrology

## 2011-12-18 MED ORDER — DARBEPOETIN ALFA-POLYSORBATE 200 MCG/0.4ML IJ SOLN
INTRAMUSCULAR | Status: AC
  Filled 2011-12-18: qty 0.4

## 2011-12-18 MED ORDER — DARBEPOETIN ALFA-POLYSORBATE 200 MCG/0.4ML IJ SOLN
200.0000 ug | INTRAMUSCULAR | Status: DC
  Administered 2011-12-18: 200 ug via SUBCUTANEOUS

## 2011-12-31 ENCOUNTER — Encounter (HOSPITAL_COMMUNITY)
Admission: RE | Admit: 2011-12-31 | Discharge: 2011-12-31 | Disposition: A | Discharge status: Home / Self Care | Payer: Medicare Other | Source: Ambulatory Visit | Attending: Nephrology | Admitting: Nephrology

## 2011-12-31 LAB — POCT HEMOGLOBIN-HEMACUE: Hemoglobin: 9.7 g/dL — ABNORMAL LOW (ref 12.0–15.0)

## 2011-12-31 MED ORDER — DARBEPOETIN ALFA-POLYSORBATE 200 MCG/0.4ML IJ SOLN
200.0000 ug | INTRAMUSCULAR | Status: DC
  Administered 2011-12-31: 200 ug via SUBCUTANEOUS

## 2011-12-31 MED ORDER — DARBEPOETIN ALFA-POLYSORBATE 200 MCG/0.4ML IJ SOLN
INTRAMUSCULAR | Status: AC
  Filled 2011-12-31: qty 0.4

## 2012-01-14 ENCOUNTER — Encounter (HOSPITAL_COMMUNITY)
Admission: RE | Admit: 2012-01-14 | Discharge: 2012-01-14 | Disposition: A | Discharge status: Home / Self Care | Payer: Medicare Other | Source: Ambulatory Visit | Attending: Nephrology | Admitting: Nephrology

## 2012-01-14 DIAGNOSIS — N184 Chronic kidney disease, stage 4 (severe): Secondary | ICD-10-CM | POA: Insufficient documentation

## 2012-01-14 DIAGNOSIS — D638 Anemia in other chronic diseases classified elsewhere: Secondary | ICD-10-CM | POA: Insufficient documentation

## 2012-01-14 LAB — POCT HEMOGLOBIN-HEMACUE: Hemoglobin: 11.4 g/dL — ABNORMAL LOW (ref 12.0–15.0)

## 2012-01-14 MED ORDER — DARBEPOETIN ALFA-POLYSORBATE 200 MCG/0.4ML IJ SOLN: 200.0000 ug | INTRAMUSCULAR | Status: DC

## 2012-01-17 ENCOUNTER — Other Ambulatory Visit (HOSPITAL_COMMUNITY): Payer: Self-pay | Admitting: Pulmonary Disease

## 2012-01-17 DIAGNOSIS — R1310 Dysphagia, unspecified: Secondary | ICD-10-CM

## 2012-01-21 ENCOUNTER — Ambulatory Visit (HOSPITAL_COMMUNITY)
Admission: RE | Admit: 2012-01-21 | Discharge: 2012-01-21 | Disposition: A | Discharge status: Home / Self Care | Payer: Medicare Other | Source: Ambulatory Visit | Attending: Pulmonary Disease | Admitting: Pulmonary Disease

## 2012-01-21 ENCOUNTER — Other Ambulatory Visit (HOSPITAL_COMMUNITY): Payer: Self-pay | Admitting: Pulmonary Disease

## 2012-01-21 DIAGNOSIS — K224 Dyskinesia of esophagus: Secondary | ICD-10-CM | POA: Insufficient documentation

## 2012-01-21 DIAGNOSIS — K219 Gastro-esophageal reflux disease without esophagitis: Secondary | ICD-10-CM | POA: Insufficient documentation

## 2012-01-21 DIAGNOSIS — R1310 Dysphagia, unspecified: Secondary | ICD-10-CM

## 2012-01-28 ENCOUNTER — Other Ambulatory Visit (HOSPITAL_COMMUNITY): Payer: Self-pay | Admitting: *Deleted

## 2012-01-28 ENCOUNTER — Encounter (HOSPITAL_COMMUNITY)
Admission: RE | Admit: 2012-01-28 | Discharge: 2012-01-28 | Disposition: A | Discharge status: Home / Self Care | Payer: Medicare Other | Source: Ambulatory Visit | Attending: Nephrology | Admitting: Nephrology

## 2012-01-28 LAB — RENAL FUNCTION PANEL
BUN: 78 mg/dL — ABNORMAL HIGH (ref 6–23)
Calcium: 9.3 mg/dL (ref 8.4–10.5)
Glucose, Bld: 132 mg/dL — ABNORMAL HIGH (ref 70–99)
Phosphorus: 3.8 mg/dL (ref 2.3–4.6)
Potassium: 4.1 mEq/L (ref 3.5–5.1)

## 2012-01-28 LAB — IRON AND TIBC
Iron: 81 ug/dL (ref 42–135)
TIBC: 303 ug/dL (ref 250–470)

## 2012-01-28 LAB — POCT HEMOGLOBIN-HEMACUE: Hemoglobin: 11 g/dL — ABNORMAL LOW (ref 12.0–15.0)

## 2012-01-28 MED ORDER — DARBEPOETIN ALFA-POLYSORBATE 200 MCG/0.4ML IJ SOLN: 200.0000 ug | INTRAMUSCULAR | Status: DC

## 2012-01-29 LAB — PTH, INTACT AND CALCIUM: PTH: 220.9 pg/mL — ABNORMAL HIGH (ref 14.0–72.0)

## 2012-02-11 ENCOUNTER — Encounter (HOSPITAL_COMMUNITY)
Admission: RE | Admit: 2012-02-11 | Discharge: 2012-02-11 | Disposition: A | Discharge status: Home / Self Care | Payer: Medicare Other | Source: Ambulatory Visit | Attending: Nephrology | Admitting: Nephrology

## 2012-02-11 DIAGNOSIS — N184 Chronic kidney disease, stage 4 (severe): Secondary | ICD-10-CM | POA: Insufficient documentation

## 2012-02-11 DIAGNOSIS — D638 Anemia in other chronic diseases classified elsewhere: Secondary | ICD-10-CM | POA: Insufficient documentation

## 2012-02-11 LAB — POCT HEMOGLOBIN-HEMACUE: Hemoglobin: 10 g/dL — ABNORMAL LOW (ref 12.0–15.0)

## 2012-02-11 MED ORDER — DARBEPOETIN ALFA-POLYSORBATE 200 MCG/0.4ML IJ SOLN
200.0000 ug | INTRAMUSCULAR | Status: DC
  Administered 2012-02-11: 200 ug via SUBCUTANEOUS
  Filled 2012-02-11: qty 0.4

## 2012-02-25 ENCOUNTER — Encounter (HOSPITAL_COMMUNITY)
Admission: RE | Admit: 2012-02-25 | Discharge: 2012-02-25 | Disposition: A | Discharge status: Home / Self Care | Payer: Medicare Other | Source: Ambulatory Visit | Attending: Nephrology | Admitting: Nephrology

## 2012-02-25 LAB — IRON AND TIBC: Saturation Ratios: 16 % — ABNORMAL LOW (ref 20–55)

## 2012-02-25 LAB — FERRITIN: Ferritin: 467 ng/mL — ABNORMAL HIGH (ref 10–291)

## 2012-02-25 MED ORDER — DARBEPOETIN ALFA-POLYSORBATE 200 MCG/0.4ML IJ SOLN
200.0000 ug | INTRAMUSCULAR | Status: DC
  Administered 2012-02-25: 200 ug via SUBCUTANEOUS

## 2012-02-25 MED ORDER — DARBEPOETIN ALFA-POLYSORBATE 200 MCG/0.4ML IJ SOLN
INTRAMUSCULAR | Status: AC
  Filled 2012-02-25: qty 0.4

## 2012-03-10 ENCOUNTER — Encounter (HOSPITAL_COMMUNITY)
Admission: RE | Admit: 2012-03-10 | Discharge: 2012-03-10 | Disposition: A | Discharge status: Home / Self Care | Payer: Medicare Other | Source: Ambulatory Visit | Attending: Nephrology | Admitting: Nephrology

## 2012-03-10 DIAGNOSIS — N184 Chronic kidney disease, stage 4 (severe): Secondary | ICD-10-CM | POA: Insufficient documentation

## 2012-03-10 DIAGNOSIS — D638 Anemia in other chronic diseases classified elsewhere: Secondary | ICD-10-CM | POA: Insufficient documentation

## 2012-03-10 LAB — POCT HEMOGLOBIN-HEMACUE: Hemoglobin: 10 g/dL — ABNORMAL LOW (ref 12.0–15.0)

## 2012-03-10 MED ORDER — DARBEPOETIN ALFA-POLYSORBATE 200 MCG/0.4ML IJ SOLN
INTRAMUSCULAR | Status: AC
  Filled 2012-03-10: qty 0.4

## 2012-03-10 MED ORDER — DARBEPOETIN ALFA-POLYSORBATE 200 MCG/0.4ML IJ SOLN
200.0000 ug | INTRAMUSCULAR | Status: DC
  Administered 2012-03-10: 200 ug via SUBCUTANEOUS

## 2012-03-12 ENCOUNTER — Other Ambulatory Visit: Payer: Medicare Other | Admitting: Lab

## 2012-03-19 ENCOUNTER — Ambulatory Visit (HOSPITAL_BASED_OUTPATIENT_CLINIC_OR_DEPARTMENT_OTHER): Payer: Medicare Other | Admitting: Internal Medicine

## 2012-03-19 ENCOUNTER — Telehealth: Payer: Self-pay | Admitting: Internal Medicine

## 2012-03-19 ENCOUNTER — Ambulatory Visit (HOSPITAL_BASED_OUTPATIENT_CLINIC_OR_DEPARTMENT_OTHER): Payer: Medicare Other | Admitting: Lab

## 2012-03-19 VITALS — BP 129/67 | HR 66 | Temp 97.5°F | Resp 18 | Wt 234.8 lb

## 2012-03-19 DIAGNOSIS — R5383 Other fatigue: Secondary | ICD-10-CM

## 2012-03-19 DIAGNOSIS — D638 Anemia in other chronic diseases classified elsewhere: Secondary | ICD-10-CM

## 2012-03-19 DIAGNOSIS — D472 Monoclonal gammopathy: Secondary | ICD-10-CM

## 2012-03-19 DIAGNOSIS — N189 Chronic kidney disease, unspecified: Secondary | ICD-10-CM

## 2012-03-19 LAB — COMPREHENSIVE METABOLIC PANEL (CC13)
BUN: 68 mg/dL — ABNORMAL HIGH (ref 7.0–26.0)
CO2: 25 mEq/L (ref 22–29)
Calcium: 9.4 mg/dL (ref 8.4–10.4)
Chloride: 105 mEq/L (ref 98–107)
Creatinine: 4.2 mg/dL (ref 0.6–1.1)
Glucose: 185 mg/dl — ABNORMAL HIGH (ref 70–99)

## 2012-03-19 LAB — CBC WITH DIFFERENTIAL/PLATELET
Basophils Absolute: 0 10*3/uL (ref 0.0–0.1)
EOS%: 1.4 % (ref 0.0–7.0)
Eosinophils Absolute: 0.1 10*3/uL (ref 0.0–0.5)
HCT: 32.9 % — ABNORMAL LOW (ref 34.8–46.6)
HGB: 11 g/dL — ABNORMAL LOW (ref 11.6–15.9)
MCH: 29.5 pg (ref 25.1–34.0)
MONO#: 0.5 10*3/uL (ref 0.1–0.9)
NEUT#: 3.7 10*3/uL (ref 1.5–6.5)
NEUT%: 71.5 % (ref 38.4–76.8)
lymph#: 0.9 10*3/uL (ref 0.9–3.3)

## 2012-03-19 LAB — LACTATE DEHYDROGENASE (CC13): LDH: 241 U/L — ABNORMAL HIGH (ref 125–220)

## 2012-03-19 NOTE — Progress Notes (Signed)
[facility] Telephone:([phone]   Fax:([phone]) [phone]  OFFICE PROGRESS NOTE  Eino Farber, MD Individual [address] [place] [address]  DIAGNOSIS:  #1 monoclonal gammopathy of undetermined significance (MGUS).  #2 anemia of chronic disease secondary to chronic kidney disease.   CURRENT THERAPY: Observation.  INTERVAL HISTORY: [name] 78 y.o. female returns to the clinic today for routine six-month followup visit. The patient is feeling fine today with no specific complaints except for the persistent fatigue. She is currently on Aranesp injection by her nephrologist for the anemia of chronic disease. Her hemoglobin has been in the range of 10.0 g/dL over the last few months. The patient is here today for evaluation of her monoclonal gammopathy. She did not show up for her myeloma panel last week. She denied having any significant weight loss or night sweats. She denied having any chest pain, shortness breath, cough or hemoptysis. No bleeding issues.  MEDICAL HISTORY: Past Medical History  Diagnosis Date  . Renal failure   . Hypertension   . Diabetes mellitus   . Arthritis     ALLERGIES:  is allergic to penicillins cross reactors.  MEDICATIONS:  Current Outpatient Prescriptions  Medication Sig Dispense Refill  . albuterol (PROVENTIL HFA;VENTOLIN HFA) 108 (90 BASE) MCG/ACT inhaler Inhale 2 puffs into the lungs 2 (two) times daily.        Marland Kitchen allopurinol (ZYLOPRIM) 100 MG tablet Take 100 mg by mouth daily.        Marland Kitchen amLODipine (NORVASC) 5 MG tablet Take 5 mg by mouth daily.      . cyclobenzaprine (FLEXERIL) 10 MG tablet Take 10 mg by mouth at bedtime.        . ferrous sulfate 325 (65 FE) MG EC tablet Take 650 mg by mouth daily with breakfast.        . furosemide (LASIX) 80 MG tablet Take 80 mg by mouth daily.        Marland Kitchen gabapentin (NEURONTIN) 100 MG capsule Take 100 mg by mouth at bedtime.        Marland Kitchen glimepiride (AMARYL) 1 MG tablet  Take 1 mg by mouth daily before breakfast.        . HYDROcodone-acetaminophen (NORCO) 7.5-325 MG per tablet Take 1 tablet by mouth daily as needed. For pain       . hydrOXYzine (ATARAX/VISTARIL) 25 MG tablet Take 25 mg by mouth every 8 (eight) hours as needed. For itching       . multivitamin (RENA-VIT) TABS tablet Take 1 tablet by mouth daily.        . pantoprazole (PROTONIX) 40 MG tablet Take 40 mg by mouth 2 (two) times daily.        . pioglitazone (ACTOS) 30 MG tablet Take 30 mg by mouth daily.          SURGICAL HISTORY:  Past Surgical History  Procedure Date  . Lobectomy   . Cholecystectomy   . Appendectomy   . Tubal ligation     REVIEW OF SYSTEMS:  A comprehensive review of systems was negative except for: Constitutional: positive for fatigue   PHYSICAL EXAMINATION: General appearance: alert, cooperative, fatigued and no distress Head: Normocephalic, without obvious abnormality, atraumatic Lymph nodes: Cervical, supraclavicular, and axillary nodes normal. Resp: clear to auscultation bilaterally Cardio: regular rate and rhythm, S1, S2 normal, no murmur, click, rub or gallop GI: soft, non-tender; bowel sounds normal; no masses,  no organomegaly Extremities: extremities normal, atraumatic,  no cyanosis with grade 1 edema bilaterally ECOG PERFORMANCE STATUS: 1 - Symptomatic but completely ambulatory  Blood pressure 129/67, pulse 66, temperature 97.5 F (36.4 C), temperature source Oral, resp. rate 18, weight 234 lb 13 oz (106.51 kg).  LABORATORY DATA: Lab Results  Component Value Date   WBC 6.4 09/18/2011   HGB 10.0* 03/10/2012   HCT 25.1* 09/18/2011   MCV 95.1 09/18/2011   PLT 250 09/18/2011      Chemistry      Component Value Date/Time   NA 141 01/28/2012 0942   K 4.1 01/28/2012 0942   CL 103 01/28/2012 0942   CO2 26 01/28/2012 0942   BUN 78* 01/28/2012 0942   CREATININE 3.71* 01/28/2012 0942      Component Value Date/Time   CALCIUM 9.3 01/28/2012 0942   CALCIUM 9.3  01/28/2012 0942   ALKPHOS 75 09/18/2011 0839   AST 17 09/18/2011 0839   ALT 11 09/18/2011 0839   BILITOT 0.2* 09/18/2011 0839       RADIOGRAPHIC STUDIES: No results found.  ASSESSMENT: This is a very pleasant 77 years old [place] American female with a history of monoclonal gammopathy of undetermined significance as well as anemia of chronic disease secondary to chronic renal insufficiency. The patient is doing fine today except for mild fatigue secondary to her.  PLAN: I will order repeat CBC, comprehensive metabolic panel, LDH and myeloma panel to be performed today and is normal I would see the patient back for followup visit in 6 months with repeat lab work including myeloma panel. The patient will continue on treatment with Aranesp per her nephrologist. She was advised to call immediately if she has any concerning symptoms in the interval.  All questions were answered. The patient knows to call the clinic with any problems, questions or concerns. We can certainly see the patient much sooner if necessary.

## 2012-03-19 NOTE — Patient Instructions (Signed)
We will repeat myeloma panel today and in 6 months if no significant abnormalities on today's lab.

## 2012-03-19 NOTE — Telephone Encounter (Signed)
gv pt appt schedule for April 2014. And pt has already gone back to lab.

## 2012-03-20 ENCOUNTER — Telehealth: Payer: Self-pay | Admitting: Medical Oncology

## 2012-03-20 NOTE — Progress Notes (Signed)
Quick Note:  Call patient with the result and follow up in 6 months as ordered ______

## 2012-03-20 NOTE — Telephone Encounter (Signed)
Message copied by Charma Igo on Thu Mar 20, 2012  3:43 PM ------      Message from: Si Gaul      Created: Thu Mar 20, 2012  3:11 PM       Call patient with the result. Protein study stable

## 2012-03-20 NOTE — Progress Notes (Signed)
Quick Note:  Call patient with the result. Protein study stable ______

## 2012-03-20 NOTE — Telephone Encounter (Signed)
Message copied by Charma Igo on Thu Mar 20, 2012  3:45 PM ------      Message from: [facility], [place]      Created: Thu Mar 20, 2012  3:10 PM       Call patient with the result and follow up in 6 months as ordered

## 2012-03-20 NOTE — Telephone Encounter (Signed)
Pt.notified

## 2012-03-20 NOTE — Telephone Encounter (Signed)
Pt notified of protein study

## 2012-03-21 LAB — KAPPA/LAMBDA LIGHT CHAINS
Kappa free light chain: 4.18 mg/dL — ABNORMAL HIGH (ref 0.33–1.94)
Lambda Free Lght Chn: 62 mg/dL — ABNORMAL HIGH (ref 0.57–2.63)

## 2012-03-24 ENCOUNTER — Encounter (HOSPITAL_COMMUNITY)
Admission: RE | Admit: 2012-03-24 | Discharge: 2012-03-24 | Disposition: A | Discharge status: Home / Self Care | Payer: Medicare Other | Source: Ambulatory Visit | Attending: Nephrology | Admitting: Nephrology

## 2012-03-24 MED ORDER — DARBEPOETIN ALFA-POLYSORBATE 200 MCG/0.4ML IJ SOLN: 200.0000 ug | INTRAMUSCULAR | Status: DC

## 2012-03-26 ENCOUNTER — Emergency Department (HOSPITAL_COMMUNITY)
Admission: EM | Admit: 2012-03-26 | Discharge: 2012-03-26 | Disposition: A | Discharge status: Home / Self Care | Payer: Medicare Other | Attending: Emergency Medicine | Admitting: Emergency Medicine

## 2012-03-26 ENCOUNTER — Encounter (HOSPITAL_COMMUNITY): Payer: Self-pay | Admitting: Emergency Medicine

## 2012-03-26 ENCOUNTER — Emergency Department (HOSPITAL_COMMUNITY): Payer: Medicare Other

## 2012-03-26 DIAGNOSIS — E119 Type 2 diabetes mellitus without complications: Secondary | ICD-10-CM | POA: Insufficient documentation

## 2012-03-26 DIAGNOSIS — I129 Hypertensive chronic kidney disease with stage 1 through stage 4 chronic kidney disease, or unspecified chronic kidney disease: Secondary | ICD-10-CM | POA: Insufficient documentation

## 2012-03-26 DIAGNOSIS — R109 Unspecified abdominal pain: Secondary | ICD-10-CM | POA: Insufficient documentation

## 2012-03-26 DIAGNOSIS — Z792 Long term (current) use of antibiotics: Secondary | ICD-10-CM | POA: Insufficient documentation

## 2012-03-26 DIAGNOSIS — N390 Urinary tract infection, site not specified: Secondary | ICD-10-CM

## 2012-03-26 DIAGNOSIS — Z79899 Other long term (current) drug therapy: Secondary | ICD-10-CM | POA: Insufficient documentation

## 2012-03-26 DIAGNOSIS — M129 Arthropathy, unspecified: Secondary | ICD-10-CM | POA: Insufficient documentation

## 2012-03-26 DIAGNOSIS — Z791 Long term (current) use of non-steroidal anti-inflammatories (NSAID): Secondary | ICD-10-CM | POA: Insufficient documentation

## 2012-03-26 LAB — COMPREHENSIVE METABOLIC PANEL
ALT: 14 U/L (ref 0–35)
Alkaline Phosphatase: 81 U/L (ref 39–117)
BUN: 85 mg/dL — ABNORMAL HIGH (ref 6–23)
CO2: 26 mEq/L (ref 19–32)
GFR calc Af Amer: 12 mL/min — ABNORMAL LOW (ref >90)
GFR calc non Af Amer: 10 mL/min — ABNORMAL LOW (ref >90)
Glucose, Bld: 67 mg/dL — ABNORMAL LOW (ref 70–99)
Potassium: 4.7 mEq/L (ref 3.5–5.1)
Total Protein: 7.5 g/dL (ref 6.0–8.3)

## 2012-03-26 LAB — CBC WITH DIFFERENTIAL/PLATELET
Eosinophils Absolute: 0.1 10*3/uL (ref 0.0–0.7)
Eosinophils Relative: 3 % (ref 0–5)
Hemoglobin: 10.6 g/dL — ABNORMAL LOW (ref 12.0–15.0)
Lymphocytes Relative: 25 % (ref 12–46)
Lymphs Abs: 1.1 10*3/uL (ref 0.7–4.0)
MCH: 28.4 pg (ref 26.0–34.0)
MCV: 86.6 fL (ref 78.0–100.0)
Monocytes Relative: 12 % (ref 3–12)
Neutrophils Relative %: 60 % (ref 43–77)
RBC: 3.73 MIL/uL — ABNORMAL LOW (ref 3.87–5.11)
WBC: 4.5 10*3/uL (ref 4.0–10.5)

## 2012-03-26 LAB — URINE MICROSCOPIC-ADD ON

## 2012-03-26 LAB — URINALYSIS, ROUTINE W REFLEX MICROSCOPIC
Bilirubin Urine: NEGATIVE
Hgb urine dipstick: NEGATIVE
Ketones, ur: NEGATIVE mg/dL
Nitrite: NEGATIVE
Protein, ur: NEGATIVE mg/dL
Specific Gravity, Urine: 1.009 (ref 1.005–1.030)
Urobilinogen, UA: 0.2 mg/dL (ref 0.0–1.0)

## 2012-03-26 LAB — LIPASE, BLOOD: Lipase: 25 U/L (ref 11–59)

## 2012-03-26 MED ORDER — ONDANSETRON HCL 4 MG/2ML IJ SOLN
4.0000 mg | Freq: Once | INTRAMUSCULAR | Status: AC
Start: 2012-03-26 — End: 2012-03-26
  Administered 2012-03-26: 4 mg via INTRAVENOUS
  Filled 2012-03-26: qty 2

## 2012-03-26 MED ORDER — CIPROFLOXACIN HCL 500 MG PO TABS: 500.0000 mg | ORAL_TABLET | Freq: Two times a day (BID) | ORAL | Status: DC

## 2012-03-26 MED ORDER — HYDROCODONE-ACETAMINOPHEN 5-325 MG PO TABS: 1.0000 | ORAL_TABLET | Freq: Four times a day (QID) | ORAL | Status: DC | PRN

## 2012-03-26 MED ORDER — HYDROMORPHONE HCL PF 1 MG/ML IJ SOLN
1.0000 mg | Freq: Once | INTRAMUSCULAR | Status: AC
  Administered 2012-03-26: 1 mg via INTRAVENOUS
  Filled 2012-03-26: qty 1

## 2012-03-26 NOTE — ED Provider Notes (Signed)
History     CSN: [id number]  Arrival date & time 03/26/12  1411   First MD Initiated Contact with Patient 03/26/12 1605      Chief Complaint  Patient presents with  . Flank Pain    (Consider location/radiation/quality/duration/timing/severity/associated sxs/prior treatment) Patient is a 78 y.o. female presenting with flank pain. The history is provided by the patient (pt complains of right flank pain). No language interpreter was used.  Flank Pain This is a new problem. The current episode started 2 days ago. The problem occurs constantly. The problem has not changed since onset.Pertinent negatives include no chest pain, no abdominal pain and no headaches. Nothing aggravates the symptoms. Nothing relieves the symptoms. She has tried nothing for the symptoms. The treatment provided moderate relief.    Past Medical History  Diagnosis Date  . Renal failure   . Hypertension   . Diabetes mellitus   . Arthritis     Past Surgical History  Procedure Date  . Lobectomy   . Cholecystectomy   . Appendectomy   . Tubal ligation     No family history on file.  History  Substance Use Topics  . Smoking status: Never Smoker   . Smokeless tobacco: Never Used  . Alcohol Use: No    OB History    Grav Para Term Preterm Abortions TAB SAB Ect Mult Living                  Review of Systems  Constitutional: Negative for fatigue.  HENT: Negative for congestion, sinus pressure and ear discharge.   Eyes: Negative for discharge.  Respiratory: Negative for cough.   Cardiovascular: Negative for chest pain.  Gastrointestinal: Negative for abdominal pain and diarrhea.  Genitourinary: Positive for flank pain. Negative for frequency and hematuria.  Musculoskeletal: Negative for back pain.  Skin: Negative for rash.  Neurological: Negative for seizures and headaches.  Hematological: Negative.   Psychiatric/Behavioral: Negative for hallucinations.    Allergies  Penicillins cross  reactors  Home Medications   Current Outpatient Rx  Name Route Sig Dispense Refill  . ALBUTEROL SULFATE HFA 108 (90 BASE) MCG/ACT IN AERS Inhalation Inhale 2 puffs into the lungs every 4 (four) hours as needed. For shortness of breath.    . ALLOPURINOL 100 MG PO TABS Oral Take 100 mg by mouth daily.      Marland Kitchen AMLODIPINE BESYLATE 5 MG PO TABS Oral Take 5 mg by mouth daily.    Marland Kitchen NEPHRO-VITE 0.8 MG PO TABS Oral Take 0.8 mg by mouth daily.    . CYCLOBENZAPRINE HCL 10 MG PO TABS Oral Take 10 mg by mouth at bedtime as needed. For spasms.    Di Kindle SULFATE 325 (65 FE) MG PO TBEC Oral Take 650 mg by mouth daily with breakfast.      . FUROSEMIDE 80 MG PO TABS Oral Take 80 mg by mouth daily.      Marland Kitchen GABAPENTIN 100 MG PO CAPS Oral Take 100 mg by mouth at bedtime.      Marland Kitchen GLIMEPIRIDE 1 MG PO TABS Oral Take 1 mg by mouth daily before breakfast.      . HYDROCODONE-ACETAMINOPHEN 7.5-325 MG PO TABS Oral Take 1 tablet by mouth daily as needed. For pain     . HYDROXYZINE HCL 25 MG PO TABS Oral Take 25 mg by mouth every 8 (eight) hours as needed. For itching     . PANTOPRAZOLE SODIUM 40 MG PO TBEC Oral Take 40 mg by mouth  2 (two) times daily.      Marland Kitchen PIOGLITAZONE HCL 30 MG PO TABS Oral Take 30 mg by mouth daily.      Marland Kitchen CIPROFLOXACIN HCL 500 MG PO TABS Oral Take 1 tablet (500 mg total) by mouth 2 (two) times daily. 28 tablet 0  . HYDROCODONE-ACETAMINOPHEN 5-325 MG PO TABS Oral Take 1 tablet by mouth every 6 (six) hours as needed for pain. 20 tablet 0    BP 128/62  Pulse 58  Temp 98.7 F (37.1 C) (Oral)  Resp 23  SpO2 100%  Physical Exam  Constitutional: She is oriented to person, place, and time. She appears well-developed.  HENT:  Head: Normocephalic and atraumatic.  Eyes: Conjunctivae normal and EOM are normal. No scleral icterus.  Neck: Neck supple. No thyromegaly present.  Cardiovascular: Normal rate and regular rhythm.  Exam reveals no gallop and no friction rub.   No murmur  heard. Pulmonary/Chest: No stridor. She has no wheezes. She has no rales. She exhibits no tenderness.  Abdominal: She exhibits no distension. There is tenderness. There is no rebound.       Tender ruq  Genitourinary:       Tender right flank  Musculoskeletal: Normal range of motion. She exhibits no edema.  Lymphadenopathy:    She has no cervical adenopathy.  Neurological: She is oriented to person, place, and time. Coordination normal.  Skin: No rash noted. No erythema.  Psychiatric: She has a normal mood and affect. Her behavior is normal.    ED Course  Procedures (including critical care time)  Labs Reviewed  CBC WITH DIFFERENTIAL - Abnormal; Notable for the following:    RBC 3.73 (*)     Hemoglobin 10.6 (*)     HCT 32.3 (*)     RDW 18.3 (*)     All other components within normal limits  COMPREHENSIVE METABOLIC PANEL - Abnormal; Notable for the following:    Glucose, Bld 67 (*)     BUN 85 (*)     Creatinine, Ser 3.99 (*)     Total Bilirubin 0.2 (*)     GFR calc non Af Amer 10 (*)     GFR calc Af Amer 12 (*)     All other components within normal limits  URINALYSIS, ROUTINE W REFLEX MICROSCOPIC - Abnormal; Notable for the following:    Leukocytes, UA SMALL (*)     All other components within normal limits  URINE MICROSCOPIC-ADD ON - Abnormal; Notable for the following:    Squamous Epithelial / LPF FEW (*)     Bacteria, UA FEW (*)     All other components within normal limits  LIPASE, BLOOD  URINE CULTURE   Ct Abdomen Pelvis Wo Contrast  03/26/2012  *RADIOLOGY REPORT*  Clinical Data: Right flank pain.  CT ABDOMEN AND PELVIS WITHOUT CONTRAST  Technique:  Multidetector CT imaging of the abdomen and pelvis was performed following the standard protocol without intravenous contrast.  Comparison: 03/06/2011 and renal ultrasound 07/12/2009  Findings: There are small patchy and nodular densities at both lung bases.  Findings could represent postinflammatory changes.  There is no  evidence for free intraperitoneal air.  Unenhanced CT was performed per clinician order.  Lack of IV contrast limits sensitivity and specificity, especially for evaluation of abdominal/pelvic solid viscera.  Cholecystectomy clips are present.  Normal appearance of the liver, spleen, pancreas and right adrenal gland.  There is a 1.7 cm left adrenal nodule with Hounsfield units of 42.  These  findings are nonspecific.  There are 3-4 small stones in the left kidney upper pole.  Largest stone roughly measures 4 mm. There is no evidence for right kidney stones.  Mild dilatation of the right renal pelvis and proximal right ureter.  The right ureter appears to be decompressed distal to the iliac vessels.  There is a subtle density near the distal right ureter but this does not clearly represent a stone.  There is slightly heterogeneous area involving the right kidney upper pole.  This area is indeterminate on this examination. Previous ultrasound suggested a cyst in this area.  There is no significant free fluid or lymphadenopathy.  The uterus has been removed.  There is extensive diverticulosis involving the sigmoid colon without acute inflammation.  There are diverticula throughout the colon.  Multilevel degenerative changes in the lumbar spine.  Vertebral body heights are maintained.  IMPRESSION: Small left kidney stones without obstruction.  Mild dilatation of the right renal pelvis and proximal right ureter without stones.  Diverticulosis without acute inflammation.  Indeterminate left adrenal nodule measuring up to 1.7 cm.  Probable cyst in the right kidney upper pole based on previous ultrasound. These areas could be further characterized with an abdominal MRI.  Small patchy nodular densities at the lung bases.  Findings probably represent postinflammatory changes.   Original Report Authenticated By: Richarda Overlie, M.D.    Dg Chest Port 1 View  03/26/2012  *RADIOLOGY REPORT*  Clinical Data: Right-sided chest pain.   Short of breath.  PORTABLE CHEST - 1 VIEW  Comparison: 04/16/2011  Findings: Upper normal heart size.  Left basilar scarring unchanged.  Lungs otherwise clear.  No pneumothorax and no pleural effusion.  IMPRESSION: No active cardiopulmonary disease.  Chronic changes at the left base.   Original Report Authenticated By: Donavan Burnet, M.D.      1. UTI (lower urinary tract infection)       MDM          Benny Lennert, MD 03/26/12 1907

## 2012-03-26 NOTE — ED Notes (Signed)
Pt states that she has had rt side flank pain going on for 5 days that hurts when she takes a deep breath.  States that it doesn't hurt if she doesn't breathe in deep.

## 2012-03-28 LAB — URINE CULTURE: Culture: NO GROWTH

## 2012-04-08 ENCOUNTER — Encounter (HOSPITAL_COMMUNITY)
Admission: RE | Admit: 2012-04-08 | Discharge: 2012-04-08 | Disposition: A | Discharge status: Home / Self Care | Payer: Medicare Other | Source: Ambulatory Visit | Attending: Nephrology | Admitting: Nephrology

## 2012-04-08 DIAGNOSIS — N184 Chronic kidney disease, stage 4 (severe): Secondary | ICD-10-CM | POA: Insufficient documentation

## 2012-04-08 DIAGNOSIS — D638 Anemia in other chronic diseases classified elsewhere: Secondary | ICD-10-CM | POA: Insufficient documentation

## 2012-04-08 LAB — IRON AND TIBC
Iron: 96 ug/dL (ref 42–135)
Saturation Ratios: 33 % (ref 20–55)
TIBC: 288 ug/dL (ref 250–470)

## 2012-04-08 LAB — FERRITIN: Ferritin: 537 ng/mL — ABNORMAL HIGH (ref 10–291)

## 2012-04-08 MED ORDER — DARBEPOETIN ALFA-POLYSORBATE 200 MCG/0.4ML IJ SOLN
INTRAMUSCULAR | Status: AC
  Filled 2012-04-08: qty 0.4

## 2012-04-08 MED ORDER — DARBEPOETIN ALFA-POLYSORBATE 200 MCG/0.4ML IJ SOLN
200.0000 ug | INTRAMUSCULAR | Status: DC
  Administered 2012-04-08: 200 ug via SUBCUTANEOUS

## 2012-04-22 ENCOUNTER — Encounter (HOSPITAL_COMMUNITY)
Admission: RE | Admit: 2012-04-22 | Discharge: 2012-04-22 | Disposition: A | Discharge status: Home / Self Care | Payer: Medicare Other | Source: Ambulatory Visit | Attending: Nephrology | Admitting: Nephrology

## 2012-04-22 LAB — POCT HEMOGLOBIN-HEMACUE: Hemoglobin: 11.4 g/dL — ABNORMAL LOW (ref 12.0–15.0)

## 2012-04-22 MED ORDER — DARBEPOETIN ALFA-POLYSORBATE 200 MCG/0.4ML IJ SOLN: 200.0000 ug | INTRAMUSCULAR | Status: DC

## 2012-05-02 ENCOUNTER — Other Ambulatory Visit (HOSPITAL_COMMUNITY): Payer: Self-pay | Admitting: *Deleted

## 2012-05-05 ENCOUNTER — Encounter (HOSPITAL_COMMUNITY)
Admission: RE | Admit: 2012-05-05 | Discharge: 2012-05-05 | Disposition: A | Discharge status: Home / Self Care | Payer: Medicare Other | Source: Ambulatory Visit | Attending: Nephrology | Admitting: Nephrology

## 2012-05-05 DIAGNOSIS — D638 Anemia in other chronic diseases classified elsewhere: Secondary | ICD-10-CM | POA: Insufficient documentation

## 2012-05-05 DIAGNOSIS — N184 Chronic kidney disease, stage 4 (severe): Secondary | ICD-10-CM | POA: Insufficient documentation

## 2012-05-05 LAB — IRON AND TIBC
Saturation Ratios: 38 % (ref 20–55)
TIBC: 325 ug/dL (ref 250–470)
UIBC: 200 ug/dL (ref 125–400)

## 2012-05-05 MED ORDER — DARBEPOETIN ALFA-POLYSORBATE 200 MCG/0.4ML IJ SOLN: 200.0000 ug | INTRAMUSCULAR | Status: DC

## 2012-05-06 LAB — PTH, INTACT AND CALCIUM
Calcium, Total (PTH): 9.3 mg/dL (ref 8.4–10.5)
PTH: 496.6 pg/mL — ABNORMAL HIGH (ref 14.0–72.0)

## 2012-05-19 ENCOUNTER — Encounter (HOSPITAL_COMMUNITY)
Admission: RE | Admit: 2012-05-19 | Discharge: 2012-05-19 | Disposition: A | Discharge status: Home / Self Care | Payer: Medicare Other | Source: Ambulatory Visit | Attending: Nephrology | Admitting: Nephrology

## 2012-05-19 LAB — RENAL FUNCTION PANEL
Albumin: 3.7 g/dL (ref 3.5–5.2)
BUN: 105 mg/dL — ABNORMAL HIGH (ref 6–23)
Calcium: 9.3 mg/dL (ref 8.4–10.5)
Creatinine, Ser: 4.12 mg/dL — ABNORMAL HIGH (ref 0.50–1.10)
Phosphorus: 5 mg/dL — ABNORMAL HIGH (ref 2.3–4.6)

## 2012-05-19 MED ORDER — DARBEPOETIN ALFA-POLYSORBATE 200 MCG/0.4ML IJ SOLN: 200.0000 ug | INTRAMUSCULAR | Status: DC

## 2012-06-02 ENCOUNTER — Encounter (HOSPITAL_COMMUNITY)
Admission: RE | Admit: 2012-06-02 | Discharge: 2012-06-02 | Disposition: A | Discharge status: Home / Self Care | Payer: Medicare Other | Source: Ambulatory Visit | Attending: Nephrology | Admitting: Nephrology

## 2012-06-02 LAB — POCT HEMOGLOBIN-HEMACUE: Hemoglobin: 10 g/dL — ABNORMAL LOW (ref 12.0–15.0)

## 2012-06-02 MED ORDER — DARBEPOETIN ALFA-POLYSORBATE 200 MCG/0.4ML IJ SOLN
200.0000 ug | INTRAMUSCULAR | Status: DC
  Administered 2012-06-02: 200 ug via SUBCUTANEOUS

## 2012-06-02 MED ORDER — DARBEPOETIN ALFA-POLYSORBATE 200 MCG/0.4ML IJ SOLN
INTRAMUSCULAR | Status: AC
  Filled 2012-06-02: qty 0.4

## 2012-06-09 LAB — IRON AND TIBC: Iron: 102 ug/dL (ref 42–135)

## 2012-06-16 ENCOUNTER — Encounter (HOSPITAL_COMMUNITY)
Admission: RE | Admit: 2012-06-16 | Discharge: 2012-06-16 | Disposition: A | Discharge status: Home / Self Care | Payer: Medicare Other | Source: Ambulatory Visit | Attending: Nephrology | Admitting: Nephrology

## 2012-06-16 DIAGNOSIS — N184 Chronic kidney disease, stage 4 (severe): Secondary | ICD-10-CM | POA: Insufficient documentation

## 2012-06-16 DIAGNOSIS — D638 Anemia in other chronic diseases classified elsewhere: Secondary | ICD-10-CM | POA: Insufficient documentation

## 2012-06-16 LAB — POCT HEMOGLOBIN-HEMACUE: Hemoglobin: 10.7 g/dL — ABNORMAL LOW (ref 12.0–15.0)

## 2012-06-16 MED ORDER — DARBEPOETIN ALFA-POLYSORBATE 200 MCG/0.4ML IJ SOLN
200.0000 ug | INTRAMUSCULAR | Status: DC
  Administered 2012-06-16: 200 ug via SUBCUTANEOUS

## 2012-06-16 MED ORDER — DARBEPOETIN ALFA-POLYSORBATE 200 MCG/0.4ML IJ SOLN
INTRAMUSCULAR | Status: AC
  Filled 2012-06-16: qty 0.4

## 2012-06-30 ENCOUNTER — Encounter (HOSPITAL_COMMUNITY)
Admission: RE | Admit: 2012-06-30 | Discharge: 2012-06-30 | Disposition: A | Discharge status: Home / Self Care | Payer: Medicare Other | Source: Ambulatory Visit | Attending: Nephrology | Admitting: Nephrology

## 2012-06-30 LAB — IRON AND TIBC
Iron: 55 ug/dL (ref 42–135)
TIBC: 262 ug/dL (ref 250–470)
UIBC: 207 ug/dL (ref 125–400)

## 2012-06-30 LAB — POCT HEMOGLOBIN-HEMACUE: Hemoglobin: 11.4 g/dL — ABNORMAL LOW (ref 12.0–15.0)

## 2012-06-30 MED ORDER — DARBEPOETIN ALFA-POLYSORBATE 200 MCG/0.4ML IJ SOLN: 200.0000 ug | INTRAMUSCULAR | Status: DC

## 2012-07-14 ENCOUNTER — Encounter (HOSPITAL_COMMUNITY)
Admission: RE | Admit: 2012-07-14 | Discharge: 2012-07-14 | Disposition: A | Discharge status: Home / Self Care | Payer: Medicare Other | Source: Ambulatory Visit | Attending: Nephrology | Admitting: Nephrology

## 2012-07-14 DIAGNOSIS — N184 Chronic kidney disease, stage 4 (severe): Secondary | ICD-10-CM | POA: Insufficient documentation

## 2012-07-14 DIAGNOSIS — D638 Anemia in other chronic diseases classified elsewhere: Secondary | ICD-10-CM | POA: Insufficient documentation

## 2012-07-14 MED ORDER — DARBEPOETIN ALFA-POLYSORBATE 200 MCG/0.4ML IJ SOLN: 200.0000 ug | INTRAMUSCULAR | Status: DC

## 2012-07-25 ENCOUNTER — Other Ambulatory Visit (HOSPITAL_COMMUNITY): Payer: Self-pay | Admitting: *Deleted

## 2012-07-28 ENCOUNTER — Encounter (HOSPITAL_COMMUNITY)
Admission: RE | Admit: 2012-07-28 | Discharge: 2012-07-28 | Disposition: A | Discharge status: Home / Self Care | Payer: Medicare Other | Source: Ambulatory Visit | Attending: Nephrology | Admitting: Nephrology

## 2012-07-28 MED ORDER — DARBEPOETIN ALFA-POLYSORBATE 200 MCG/0.4ML IJ SOLN
200.0000 ug | INTRAMUSCULAR | Status: DC
  Administered 2012-07-28: 200 ug via SUBCUTANEOUS

## 2012-07-28 MED ORDER — DARBEPOETIN ALFA-POLYSORBATE 200 MCG/0.4ML IJ SOLN
INTRAMUSCULAR | Status: AC
  Filled 2012-07-28: qty 0.4

## 2012-08-11 ENCOUNTER — Encounter (HOSPITAL_COMMUNITY)
Admission: RE | Admit: 2012-08-11 | Discharge: 2012-08-11 | Disposition: A | Discharge status: Home / Self Care | Payer: Medicare Other | Source: Ambulatory Visit | Attending: Nephrology | Admitting: Nephrology

## 2012-08-11 DIAGNOSIS — N184 Chronic kidney disease, stage 4 (severe): Secondary | ICD-10-CM | POA: Insufficient documentation

## 2012-08-11 DIAGNOSIS — D638 Anemia in other chronic diseases classified elsewhere: Secondary | ICD-10-CM | POA: Insufficient documentation

## 2012-08-11 LAB — RENAL FUNCTION PANEL
CO2: 25 mEq/L (ref 19–32)
GFR calc Af Amer: 9 mL/min — ABNORMAL LOW (ref >90)
Glucose, Bld: 155 mg/dL — ABNORMAL HIGH (ref 70–99)
Potassium: 3.8 mEq/L (ref 3.5–5.1)
Sodium: 140 mEq/L (ref 135–145)

## 2012-08-11 MED ORDER — DARBEPOETIN ALFA-POLYSORBATE 200 MCG/0.4ML IJ SOLN
INTRAMUSCULAR | Status: AC
  Administered 2012-08-11: 200 ug via SUBCUTANEOUS
  Filled 2012-08-11: qty 0.4

## 2012-08-11 MED ORDER — DARBEPOETIN ALFA-POLYSORBATE 200 MCG/0.4ML IJ SOLN: 200.0000 ug | INTRAMUSCULAR | Status: DC

## 2012-08-12 LAB — PTH, INTACT AND CALCIUM: PTH: 485.1 pg/mL — ABNORMAL HIGH (ref 14.0–72.0)

## 2012-08-25 ENCOUNTER — Encounter (HOSPITAL_COMMUNITY)
Admission: RE | Admit: 2012-08-25 | Discharge: 2012-08-25 | Disposition: A | Discharge status: Home / Self Care | Payer: Medicare Other | Source: Ambulatory Visit | Attending: Nephrology | Admitting: Nephrology

## 2012-08-25 LAB — POCT HEMOGLOBIN-HEMACUE: Hemoglobin: 10.9 g/dL — ABNORMAL LOW (ref 12.0–15.0)

## 2012-08-25 LAB — IRON AND TIBC: UIBC: 234 ug/dL (ref 125–400)

## 2012-08-25 MED ORDER — DARBEPOETIN ALFA-POLYSORBATE 200 MCG/0.4ML IJ SOLN
INTRAMUSCULAR | Status: AC
  Filled 2012-08-25: qty 0.4

## 2012-08-25 MED ORDER — DARBEPOETIN ALFA-POLYSORBATE 200 MCG/0.4ML IJ SOLN
200.0000 ug | INTRAMUSCULAR | Status: DC
  Administered 2012-08-25: 200 ug via SUBCUTANEOUS

## 2012-09-09 ENCOUNTER — Encounter (HOSPITAL_COMMUNITY)
Admission: RE | Admit: 2012-09-09 | Discharge: 2012-09-09 | Disposition: A | Discharge status: Home / Self Care | Payer: Medicare Other | Source: Ambulatory Visit | Attending: Nephrology | Admitting: Nephrology

## 2012-09-09 DIAGNOSIS — N184 Chronic kidney disease, stage 4 (severe): Secondary | ICD-10-CM | POA: Insufficient documentation

## 2012-09-09 DIAGNOSIS — D638 Anemia in other chronic diseases classified elsewhere: Secondary | ICD-10-CM | POA: Insufficient documentation

## 2012-09-09 LAB — POCT HEMOGLOBIN-HEMACUE: Hemoglobin: 11.3 g/dL — ABNORMAL LOW (ref 12.0–15.0)

## 2012-09-09 MED ORDER — DARBEPOETIN ALFA-POLYSORBATE 200 MCG/0.4ML IJ SOLN: 200.0000 ug | INTRAMUSCULAR | Status: DC

## 2012-09-17 ENCOUNTER — Other Ambulatory Visit (HOSPITAL_BASED_OUTPATIENT_CLINIC_OR_DEPARTMENT_OTHER): Payer: Medicare Other | Admitting: Lab

## 2012-09-17 DIAGNOSIS — D472 Monoclonal gammopathy: Secondary | ICD-10-CM

## 2012-09-17 LAB — CBC WITH DIFFERENTIAL/PLATELET
Basophils Absolute: 0 10*3/uL (ref 0.0–0.1)
EOS%: 2.4 % (ref 0.0–7.0)
Eosinophils Absolute: 0.1 10*3/uL (ref 0.0–0.5)
HGB: 10.5 g/dL — ABNORMAL LOW (ref 11.6–15.9)
MCH: 28.4 pg (ref 25.1–34.0)
NEUT#: 3.1 10*3/uL (ref 1.5–6.5)
RBC: 3.68 10*6/uL — ABNORMAL LOW (ref 3.70–5.45)
RDW: 19.1 % — ABNORMAL HIGH (ref 11.2–14.5)
lymph#: 0.9 10*3/uL (ref 0.9–3.3)

## 2012-09-17 LAB — COMPREHENSIVE METABOLIC PANEL (CC13)
AST: 19 U/L (ref 5–34)
Albumin: 3.4 g/dL — ABNORMAL LOW (ref 3.5–5.0)
Alkaline Phosphatase: 75 U/L (ref 40–150)
BUN: 74 mg/dL — ABNORMAL HIGH (ref 7.0–26.0)
Calcium: 9.1 mg/dL (ref 8.4–10.4)
Chloride: 98 mEq/L (ref 98–107)
Potassium: 4.2 mEq/L (ref 3.5–5.1)
Sodium: 141 mEq/L (ref 136–145)
Total Protein: 8.1 g/dL (ref 6.4–8.3)

## 2012-09-18 LAB — IGG, IGA, IGM
IgA: 106 mg/dL (ref 69–380)
IgG (Immunoglobin G), Serum: 1760 mg/dL — ABNORMAL HIGH (ref 690–1700)
IgM, Serum: 65 mg/dL (ref 52–322)

## 2012-09-18 LAB — KAPPA/LAMBDA LIGHT CHAINS: Kappa free light chain: 4.52 mg/dL — ABNORMAL HIGH (ref 0.33–1.94)

## 2012-09-22 ENCOUNTER — Telehealth: Payer: Self-pay | Admitting: Internal Medicine

## 2012-09-22 ENCOUNTER — Ambulatory Visit (HOSPITAL_BASED_OUTPATIENT_CLINIC_OR_DEPARTMENT_OTHER): Payer: Medicare Other | Admitting: Internal Medicine

## 2012-09-22 ENCOUNTER — Encounter: Payer: Self-pay | Admitting: Internal Medicine

## 2012-09-22 VITALS — BP 138/68 | HR 69 | Temp 99.2°F | Resp 20 | Ht 65.0 in | Wt 237.8 lb

## 2012-09-22 DIAGNOSIS — D472 Monoclonal gammopathy: Secondary | ICD-10-CM

## 2012-09-22 DIAGNOSIS — N189 Chronic kidney disease, unspecified: Secondary | ICD-10-CM

## 2012-09-22 DIAGNOSIS — D631 Anemia in chronic kidney disease: Secondary | ICD-10-CM

## 2012-09-22 NOTE — Patient Instructions (Signed)
Myeloma panel showed increase in the free lambda light chain as well as a beta-2 microglobulin. I will order a bone marrow biopsy and aspirate for reevaluation of her disease. Followup visit in one month

## 2012-09-22 NOTE — Progress Notes (Signed)
[facility] [phone]   [phone]  OFFICE PROGRESS NOTE  Julia Farber, MD [address] [place] [address]  DIAGNOSIS:  #1 monoclonal gammopathy of undetermined significance (MGUS).  #2 anemia of chronic disease secondary to chronic kidney disease.   CURRENT THERAPY: Observation.  INTERVAL HISTORY: [name] 79 y.o. female returns to the clinic today for routine six-month followup visit. The patient is feeling fine today with no specific complaints except for mild fatigue. She is followed by nephrology for chronic renal insufficiency and her serum creatinine has been over 4.0 over the last few months. The patient denied having any significant chest pain, shortness of breath, cough or hemoptysis. She denied having any significant weight loss or night sweats. She had repeat myeloma panel performed recently and she is here for evaluation and discussion of her lab results.  MEDICAL HISTORY: Past Medical History  Diagnosis Date  . Renal failure   . Hypertension   . Diabetes mellitus   . Arthritis     ALLERGIES:  is allergic to penicillins cross reactors.  MEDICATIONS:  Current Outpatient Prescriptions  Medication Sig Dispense Refill  . albuterol (PROVENTIL HFA;VENTOLIN HFA) 108 (90 BASE) MCG/ACT inhaler Inhale 2 puffs into the lungs every 4 (four) hours as needed. For shortness of breath.      Marland Kitchen b complex-vitamin c-folic acid (NEPHRO-VITE) 0.8 MG TABS Take 0.8 mg by mouth daily.      . ferrous sulfate 325 (65 FE) MG EC tablet Take 650 mg by mouth daily with breakfast.        . glimepiride (AMARYL) 1 MG tablet Take 1 mg by mouth daily before breakfast.        . Oxycodone HCl 20 MG TABS       . pantoprazole (PROTONIX) 40 MG tablet Take 40 mg by mouth 2 (two) times daily.        . VOLTAREN 1 % GEL        No current facility-administered medications for this visit.    SURGICAL HISTORY:  Past Surgical History    Procedure Laterality Date  . Lobectomy    . Cholecystectomy    . Appendectomy    . Tubal ligation      REVIEW OF SYSTEMS:  A comprehensive review of systems was negative except for: Constitutional: positive for fatigue   PHYSICAL EXAMINATION: General appearance: alert, cooperative, fatigued and no distress Head: Normocephalic, without obvious abnormality, atraumatic Neck: no adenopathy Resp: clear to auscultation bilaterally Cardio: regular rate and rhythm, S1, S2 normal, no murmur, click, rub or gallop GI: soft, non-tender; bowel sounds normal; no masses,  no organomegaly Extremities: 1+ edema bilaterally  ECOG PERFORMANCE STATUS: 1 - Symptomatic but completely ambulatory  Blood pressure 138/68, pulse 69, temperature 99.2 F (37.3 C), temperature source Oral, resp. rate 20, height 5\' 5"  (1.651 m), weight 237 lb 12.8 oz (107.865 kg).  LABORATORY DATA: Lab Results  Component Value Date   WBC 4.6 09/17/2012   HGB 10.5* 09/17/2012   HCT 32.2* 09/17/2012   MCV 87.4 09/17/2012   PLT 229 09/17/2012      Chemistry      Component Value Date/Time   NA 141 09/17/2012 1242   NA 140 08/11/2012 0959   K 4.2 09/17/2012 1242   K 3.8 08/11/2012 0959   CL 98 09/17/2012 1242   CL 100 08/11/2012 0959   CO2 28 09/17/2012 1242   CO2 25 08/11/2012 0959  BUN 74.0* 09/17/2012 1242   BUN 102* 08/11/2012 0959   CREATININE 4.6* 09/17/2012 1242   CREATININE 4.66* 08/11/2012 0959      Component Value Date/Time   CALCIUM 9.1 09/17/2012 1242   CALCIUM 9.0 08/11/2012 0959   CALCIUM 8.7 08/11/2012 0958   ALKPHOS 75 09/17/2012 1242   ALKPHOS 81 03/26/2012 1643   AST 19 09/17/2012 1242   AST 23 03/26/2012 1643   ALT 18 09/17/2012 1242   ALT 14 03/26/2012 1643   BILITOT 0.31 09/17/2012 1242   BILITOT 0.2* 03/26/2012 1643       RADIOGRAPHIC STUDIES: No results found.  ASSESSMENT: This is a very pleasant 79 years old [place] American female with a monoclonal normocephalic and significance as well as anemia  of chronic disease secondary to chronic kidney disease. The patient is doing fine but the myeloma panel showed some evidence for disease progression.   PLAN: I discussed the lab result with the patient today and give her a couple of her report. I recommended for her to have a bone marrow biopsy and aspirate performed by interventional radiology. I would see her back for followup visit in one month's for reevaluation and discussion of his biopsy results and recommendation for treatment of her condition. For the chronic renal insufficiency, the patient is followed by nephrology. All questions were answered. The patient knows to call the clinic with any problems, questions or concerns. We can certainly see the patient much sooner if necessary.

## 2012-09-23 ENCOUNTER — Encounter (HOSPITAL_COMMUNITY)
Admission: RE | Admit: 2012-09-23 | Discharge: 2012-09-23 | Disposition: A | Discharge status: Home / Self Care | Payer: Medicare Other | Source: Ambulatory Visit | Attending: Nephrology | Admitting: Nephrology

## 2012-09-23 LAB — RENAL FUNCTION PANEL
BUN: 66 mg/dL — ABNORMAL HIGH (ref 6–23)
CO2: 29 mEq/L (ref 19–32)
Calcium: 8.9 mg/dL (ref 8.4–10.5)
Chloride: 94 mEq/L — ABNORMAL LOW (ref 96–112)
Creatinine, Ser: 4.26 mg/dL — ABNORMAL HIGH (ref 0.50–1.10)
GFR calc Af Amer: 11 mL/min — ABNORMAL LOW (ref >90)
GFR calc non Af Amer: 9 mL/min — ABNORMAL LOW (ref >90)
Glucose, Bld: 145 mg/dL — ABNORMAL HIGH (ref 70–99)
Phosphorus: 5.4 mg/dL — ABNORMAL HIGH (ref 2.3–4.6)
Potassium: 3.8 mEq/L (ref 3.5–5.1)

## 2012-09-23 MED ORDER — DARBEPOETIN ALFA-POLYSORBATE 200 MCG/0.4ML IJ SOLN
200.0000 ug | INTRAMUSCULAR | Status: DC
  Administered 2012-09-23: 200 ug via SUBCUTANEOUS

## 2012-09-23 MED ORDER — DARBEPOETIN ALFA-POLYSORBATE 200 MCG/0.4ML IJ SOLN
INTRAMUSCULAR | Status: AC
  Filled 2012-09-23: qty 0.4

## 2012-09-24 ENCOUNTER — Inpatient Hospital Stay (HOSPITAL_COMMUNITY)
Admission: EM | Admit: 2012-09-24 | Discharge: 2012-09-26 | DRG: 392 | Disposition: A | Discharge status: Home / Self Care | Payer: Medicare Other | Attending: Internal Medicine | Admitting: Internal Medicine

## 2012-09-24 ENCOUNTER — Encounter (HOSPITAL_COMMUNITY): Payer: Self-pay

## 2012-09-24 ENCOUNTER — Emergency Department (HOSPITAL_COMMUNITY): Payer: Medicare Other

## 2012-09-24 DIAGNOSIS — N184 Chronic kidney disease, stage 4 (severe): Secondary | ICD-10-CM | POA: Diagnosis present

## 2012-09-24 DIAGNOSIS — D472 Monoclonal gammopathy: Secondary | ICD-10-CM | POA: Diagnosis present

## 2012-09-24 DIAGNOSIS — Z79899 Other long term (current) drug therapy: Secondary | ICD-10-CM

## 2012-09-24 DIAGNOSIS — N039 Chronic nephritic syndrome with unspecified morphologic changes: Secondary | ICD-10-CM | POA: Diagnosis present

## 2012-09-24 DIAGNOSIS — N183 Chronic kidney disease, stage 3 unspecified: Secondary | ICD-10-CM | POA: Diagnosis present

## 2012-09-24 DIAGNOSIS — E119 Type 2 diabetes mellitus without complications: Secondary | ICD-10-CM

## 2012-09-24 DIAGNOSIS — K5732 Diverticulitis of large intestine without perforation or abscess without bleeding: Principal | ICD-10-CM | POA: Diagnosis present

## 2012-09-24 DIAGNOSIS — G8929 Other chronic pain: Secondary | ICD-10-CM | POA: Diagnosis present

## 2012-09-24 DIAGNOSIS — Z880 Allergy status to penicillin: Secondary | ICD-10-CM

## 2012-09-24 DIAGNOSIS — R52 Pain, unspecified: Secondary | ICD-10-CM

## 2012-09-24 DIAGNOSIS — D631 Anemia in chronic kidney disease: Secondary | ICD-10-CM | POA: Diagnosis present

## 2012-09-24 DIAGNOSIS — M109 Gout, unspecified: Secondary | ICD-10-CM | POA: Diagnosis present

## 2012-09-24 DIAGNOSIS — R109 Unspecified abdominal pain: Secondary | ICD-10-CM | POA: Diagnosis present

## 2012-09-24 DIAGNOSIS — E118 Type 2 diabetes mellitus with unspecified complications: Secondary | ICD-10-CM | POA: Diagnosis present

## 2012-09-24 DIAGNOSIS — I129 Hypertensive chronic kidney disease with stage 1 through stage 4 chronic kidney disease, or unspecified chronic kidney disease: Secondary | ICD-10-CM | POA: Diagnosis present

## 2012-09-24 DIAGNOSIS — K5792 Diverticulitis of intestine, part unspecified, without perforation or abscess without bleeding: Secondary | ICD-10-CM | POA: Diagnosis present

## 2012-09-24 DIAGNOSIS — K219 Gastro-esophageal reflux disease without esophagitis: Secondary | ICD-10-CM | POA: Diagnosis present

## 2012-09-24 LAB — CBC WITH DIFFERENTIAL/PLATELET
Basophils Relative: 0 % (ref 0–1)
HCT: 31.8 % — ABNORMAL LOW (ref 36.0–46.0)
Hemoglobin: 10.8 g/dL — ABNORMAL LOW (ref 12.0–15.0)
Lymphs Abs: 1 10*3/uL (ref 0.7–4.0)
MCHC: 34 g/dL (ref 30.0–36.0)
Monocytes Absolute: 0.5 10*3/uL (ref 0.1–1.0)
Monocytes Relative: 7 % (ref 3–12)
Neutro Abs: 6.1 10*3/uL (ref 1.7–7.7)
Neutrophils Relative %: 78 % — ABNORMAL HIGH (ref 43–77)
RBC: 3.74 MIL/uL — ABNORMAL LOW (ref 3.87–5.11)

## 2012-09-24 LAB — URINALYSIS, ROUTINE W REFLEX MICROSCOPIC
Bilirubin Urine: NEGATIVE
Glucose, UA: NEGATIVE mg/dL
Ketones, ur: NEGATIVE mg/dL
Leukocytes, UA: NEGATIVE
Specific Gravity, Urine: 1.008 (ref 1.005–1.030)
pH: 7 (ref 5.0–8.0)

## 2012-09-24 LAB — CREATININE, SERUM
GFR calc Af Amer: 12 mL/min — ABNORMAL LOW (ref >90)
GFR calc non Af Amer: 10 mL/min — ABNORMAL LOW (ref >90)

## 2012-09-24 LAB — POCT I-STAT, CHEM 8
BUN: 59 mg/dL — ABNORMAL HIGH (ref 6–23)
Calcium, Ion: 1.07 mmol/L — ABNORMAL LOW (ref 1.13–1.30)
Chloride: 95 mEq/L — ABNORMAL LOW (ref 96–112)
Creatinine, Ser: 3.8 mg/dL — ABNORMAL HIGH (ref 0.50–1.10)
Glucose, Bld: 90 mg/dL (ref 70–99)
HCT: 36 % (ref 36.0–46.0)
Potassium: 4.7 mEq/L (ref 3.5–5.1)

## 2012-09-24 LAB — CBC
HCT: 33.2 % — ABNORMAL LOW (ref 36.0–46.0)
Hemoglobin: 11.2 g/dL — ABNORMAL LOW (ref 12.0–15.0)
MCV: 84.5 fL (ref 78.0–100.0)
WBC: 9.5 10*3/uL (ref 4.0–10.5)

## 2012-09-24 LAB — LIPASE, BLOOD: Lipase: 21 U/L (ref 11–59)

## 2012-09-24 MED ORDER — BIOTENE DRY MOUTH MT LIQD
15.0000 mL | Freq: Two times a day (BID) | OROMUCOSAL | Status: DC
  Administered 2012-09-25 – 2012-09-26 (×3): 15 mL via OROMUCOSAL

## 2012-09-24 MED ORDER — METRONIDAZOLE IN NACL 5-0.79 MG/ML-% IV SOLN
500.0000 mg | Freq: Three times a day (TID) | INTRAVENOUS | Status: DC
  Administered 2012-09-24 – 2012-09-26 (×5): 500 mg via INTRAVENOUS
  Filled 2012-09-24 (×7): qty 100

## 2012-09-24 MED ORDER — SODIUM CHLORIDE 0.9 % IV SOLN: INTRAVENOUS | Status: DC

## 2012-09-24 MED ORDER — ACETAMINOPHEN 650 MG RE SUPP: 650.0000 mg | Freq: Four times a day (QID) | RECTAL | Status: DC | PRN

## 2012-09-24 MED ORDER — ONDANSETRON HCL 4 MG PO TABS: 4.0000 mg | ORAL_TABLET | Freq: Four times a day (QID) | ORAL | Status: DC | PRN

## 2012-09-24 MED ORDER — PANTOPRAZOLE SODIUM 40 MG PO TBEC
40.0000 mg | DELAYED_RELEASE_TABLET | Freq: Two times a day (BID) | ORAL | Status: DC
  Administered 2012-09-24 – 2012-09-26 (×4): 40 mg via ORAL
  Filled 2012-09-24 (×4): qty 1

## 2012-09-24 MED ORDER — AMLODIPINE BESYLATE 5 MG PO TABS
5.0000 mg | ORAL_TABLET | Freq: Every day | ORAL | Status: DC
  Administered 2012-09-25 – 2012-09-26 (×2): 5 mg via ORAL
  Filled 2012-09-24 (×2): qty 1

## 2012-09-24 MED ORDER — ACETAMINOPHEN 325 MG PO TABS: 650.0000 mg | ORAL_TABLET | Freq: Four times a day (QID) | ORAL | Status: DC | PRN

## 2012-09-24 MED ORDER — ENOXAPARIN SODIUM 30 MG/0.3ML ~~LOC~~ SOLN
30.0000 mg | SUBCUTANEOUS | Status: DC
  Administered 2012-09-24 – 2012-09-25 (×2): 30 mg via SUBCUTANEOUS
  Filled 2012-09-24 (×3): qty 0.3

## 2012-09-24 MED ORDER — ALBUTEROL SULFATE HFA 108 (90 BASE) MCG/ACT IN AERS
2.0000 | INHALATION_SPRAY | RESPIRATORY_TRACT | Status: DC | PRN
  Filled 2012-09-24: qty 6.7

## 2012-09-24 MED ORDER — METRONIDAZOLE IN NACL 5-0.79 MG/ML-% IV SOLN: 500.0000 mg | Freq: Once | INTRAVENOUS | Status: DC

## 2012-09-24 MED ORDER — CHLORHEXIDINE GLUCONATE 0.12 % MT SOLN
15.0000 mL | Freq: Two times a day (BID) | OROMUCOSAL | Status: DC
  Administered 2012-09-25 – 2012-09-26 (×3): 15 mL via OROMUCOSAL
  Filled 2012-09-24 (×6): qty 15

## 2012-09-24 MED ORDER — FENTANYL CITRATE 0.05 MG/ML IJ SOLN
50.0000 ug | Freq: Once | INTRAMUSCULAR | Status: AC
  Administered 2012-09-24: 50 ug via INTRAVENOUS
  Filled 2012-09-24: qty 2

## 2012-09-24 MED ORDER — IOHEXOL 300 MG/ML  SOLN
25.0000 mL | INTRAMUSCULAR | Status: AC
  Administered 2012-09-24 (×2): 25 mL via ORAL

## 2012-09-24 MED ORDER — GABAPENTIN 100 MG PO CAPS
100.0000 mg | ORAL_CAPSULE | Freq: Two times a day (BID) | ORAL | Status: DC
  Administered 2012-09-24 – 2012-09-26 (×4): 100 mg via ORAL
  Filled 2012-09-24 (×5): qty 1

## 2012-09-24 MED ORDER — HYDROCODONE-ACETAMINOPHEN 5-325 MG PO TABS
1.0000 | ORAL_TABLET | ORAL | Status: DC | PRN
  Administered 2012-09-24 – 2012-09-25 (×2): 2 via ORAL
  Filled 2012-09-24 (×2): qty 2

## 2012-09-24 MED ORDER — ONDANSETRON HCL 4 MG/2ML IJ SOLN: 4.0000 mg | Freq: Four times a day (QID) | INTRAMUSCULAR | Status: DC | PRN

## 2012-09-24 MED ORDER — HYDROMORPHONE HCL PF 1 MG/ML IJ SOLN
INTRAMUSCULAR | Status: AC
  Administered 2012-09-24: 1 mg via INTRAVENOUS
  Filled 2012-09-24: qty 1

## 2012-09-24 MED ORDER — OXYCODONE HCL 20 MG PO TB12: 20.0000 mg | ORAL_TABLET | Freq: Two times a day (BID) | ORAL | Status: DC

## 2012-09-24 MED ORDER — HYDROMORPHONE HCL PF 1 MG/ML IJ SOLN: 1.0000 mg | INTRAMUSCULAR | Status: DC | PRN

## 2012-09-24 MED ORDER — ONDANSETRON HCL 4 MG/2ML IJ SOLN
4.0000 mg | Freq: Once | INTRAMUSCULAR | Status: AC
  Administered 2012-09-24: 4 mg via INTRAVENOUS
  Filled 2012-09-24: qty 2

## 2012-09-24 MED ORDER — SODIUM CHLORIDE 0.9 % IV SOLN
INTRAVENOUS | Status: DC
  Administered 2012-09-24: 21:00:00 via INTRAVENOUS

## 2012-09-24 MED ORDER — OXYCODONE HCL ER 10 MG PO T12A
20.0000 mg | EXTENDED_RELEASE_TABLET | Freq: Two times a day (BID) | ORAL | Status: DC
  Administered 2012-09-24 – 2012-09-26 (×4): 20 mg via ORAL
  Filled 2012-09-24 (×4): qty 2

## 2012-09-24 MED ORDER — CIPROFLOXACIN IN D5W 400 MG/200ML IV SOLN
400.0000 mg | Freq: Once | INTRAVENOUS | Status: AC
  Administered 2012-09-24: 400 mg via INTRAVENOUS
  Filled 2012-09-24: qty 200

## 2012-09-24 MED ORDER — CIPROFLOXACIN IN D5W 400 MG/200ML IV SOLN
400.0000 mg | INTRAVENOUS | Status: DC
  Administered 2012-09-25: 400 mg via INTRAVENOUS
  Filled 2012-09-24 (×2): qty 200

## 2012-09-24 NOTE — ED Notes (Signed)
Pt reports she is predialysis and has a graft in her left arm. Pink band placed.

## 2012-09-24 NOTE — ED Notes (Signed)
Patient presents with c/o lower abdominal pain that began two days ago. Pt says her stomach when she urinates and after urinating as well. No n/v/d.

## 2012-09-24 NOTE — ED Provider Notes (Signed)
History     CSN: [id number]  Arrival date & time 09/24/12  0904   First MD Initiated Contact with Patient 09/24/12 [phone]      Chief Complaint  Patient presents with  . Abdominal Pain    (Consider location/radiation/quality/duration/timing/severity/associated sxs/prior treatment) HPI Assessment low abdominal pain onset yesterday. Pain is worse with urination. No anorexia no fever last probably has a normal no vomiting no nausea no other associated symptoms. Suffered a hydrocodone tablet this morning with minimal relief. Is moderate at present, nonradiating Past Medical History  Diagnosis Date  . Renal failure   . Hypertension   . Diabetes mellitus   . Arthritis     Past Surgical History  Procedure Laterality Date  . Lobectomy    . Cholecystectomy    . Appendectomy    . Tubal ligation      No family history on file.  History  Substance Use Topics  . Smoking status: Never Smoker   . Smokeless tobacco: Never Used  . Alcohol Use: No    OB History   Grav Para Term Preterm Abortions TAB SAB Ect Mult Living                  Review of Systems  Constitutional: Negative.   HENT: Negative.   Respiratory: Negative.   Cardiovascular: Negative.   Gastrointestinal: Positive for abdominal pain.  Genitourinary: Positive for dysuria.  Musculoskeletal: Negative.   Skin: Negative.   Neurological: Negative.   Psychiatric/Behavioral: Negative.   All other systems reviewed and are negative.    Allergies  Penicillins cross reactors  Home Medications   Current Outpatient Rx  Name  Route  Sig  Dispense  Refill  . albuterol (PROVENTIL HFA;VENTOLIN HFA) 108 (90 BASE) MCG/ACT inhaler   Inhalation   Inhale 2 puffs into the lungs every 4 (four) hours as needed. For shortness of breath.         . allopurinol (ZYLOPRIM) 100 MG tablet   Oral   Take 100 mg by mouth daily.         Marland Kitchen amLODipine (NORVASC) 5 MG tablet   Oral   Take 5 mg by mouth daily.         Marland Kitchen b  complex-vitamin c-folic acid (NEPHRO-VITE) 0.8 MG TABS   Oral   Take 0.8 mg by mouth daily.         . diclofenac sodium (VOLTAREN) 1 % GEL   Topical   Apply 2 g topically daily as needed (for arthritis pain).         . ferrous sulfate 325 (65 FE) MG EC tablet   Oral   Take 650 mg by mouth daily with breakfast.          . furosemide (LASIX) 80 MG tablet   Oral   Take 80 mg by mouth 2 (two) times daily.         Marland Kitchen gabapentin (NEURONTIN) 100 MG capsule   Oral   Take 100 mg by mouth 2 (two) times daily.         Marland Kitchen glimepiride (AMARYL) 1 MG tablet   Oral   Take 1 mg by mouth daily before breakfast.          . HYDROcodone-acetaminophen (NORCO) 7.5-325 MG per tablet   Oral   Take 1 tablet by mouth daily as needed for pain.         Marland Kitchen oxyCODONE (OXYCONTIN) 20 MG 12 hr tablet   Oral   Take 20  mg by mouth every 12 (twelve) hours.         . pantoprazole (PROTONIX) 40 MG tablet   Oral   Take 40 mg by mouth 2 (two) times daily.          . saxagliptin HCl (ONGLYZA) 2.5 MG TABS tablet   Oral   Take 2.5 mg by mouth daily.           BP 135/50  Pulse 72  Temp(Src) 98.4 F (36.9 C) (Oral)  Resp 16  SpO2 96%  Physical Exam  Nursing note and vitals reviewed. Constitutional: She appears well-developed and well-nourished.  HENT:  Head: Normocephalic and atraumatic.  Eyes: Conjunctivae are normal. Pupils are equal, round, and reactive to light.  Neck: Neck supple. No tracheal deviation present. No thyromegaly present.  Cardiovascular: Normal rate and regular rhythm.   No murmur heard. Pulmonary/Chest: Effort normal and breath sounds normal.  Abdominal: Soft. Bowel sounds are normal. She exhibits no distension. There is no tenderness.  Obese suprapubic tenderness  Musculoskeletal: Normal range of motion. She exhibits no edema and no tenderness.  Neurological: She is alert. Coordination normal.  Skin: Skin is warm and dry. No rash noted.  Psychiatric: She has a  normal mood and affect.    ED Course  Procedures (including critical care time)  Labs Reviewed - No data to display No results found.   No diagnosis found.  Results for orders placed during the hospital encounter of 09/24/12  URINALYSIS, ROUTINE W REFLEX MICROSCOPIC      Result Value Range   Color, Urine YELLOW  YELLOW   APPearance CLEAR  CLEAR   Specific Gravity, Urine 1.008  1.005 - 1.030   pH 7.0  5.0 - 8.0   Glucose, UA NEGATIVE  NEGATIVE mg/dL   Hgb urine dipstick NEGATIVE  NEGATIVE   Bilirubin Urine NEGATIVE  NEGATIVE   Ketones, ur NEGATIVE  NEGATIVE mg/dL   Protein, ur NEGATIVE  NEGATIVE mg/dL   Urobilinogen, UA 0.2  0.0 - 1.0 mg/dL   Nitrite NEGATIVE  NEGATIVE   Leukocytes, UA NEGATIVE  NEGATIVE  CBC WITH DIFFERENTIAL      Result Value Range   WBC 7.7  4.0 - 10.5 K/uL   RBC 3.74 (*) 3.87 - 5.11 MIL/uL   Hemoglobin 10.8 (*) 12.0 - 15.0 g/dL   HCT 95.6 (*) 38.7 - 56.4 %   MCV 85.0  78.0 - 100.0 fL   MCH 28.9  26.0 - 34.0 pg   MCHC 34.0  30.0 - 36.0 g/dL   RDW 33.2 (*) 95.1 - 88.4 %   Platelets 227  150 - 400 K/uL   Neutrophils Relative 78 (*) 43 - 77 %   Neutro Abs 6.1  1.7 - 7.7 K/uL   Lymphocytes Relative 13  12 - 46 %   Lymphs Abs 1.0  0.7 - 4.0 K/uL   Monocytes Relative 7  3 - 12 %   Monocytes Absolute 0.5  0.1 - 1.0 K/uL   Eosinophils Relative 1  0 - 5 %   Eosinophils Absolute 0.1  0.0 - 0.7 K/uL   Basophils Relative 0  0 - 1 %   Basophils Absolute 0.0  0.0 - 0.1 K/uL  POCT I-STAT, CHEM 8      Result Value Range   Sodium 136  135 - 145 mEq/L   Potassium 4.7  3.5 - 5.1 mEq/L   Chloride 95 (*) 96 - 112 mEq/L   BUN 59 (*) 6 -  23 mg/dL   Creatinine, Ser 9.56 (*) 0.50 - 1.10 mg/dL   Glucose, Bld 90  70 - 99 mg/dL   Calcium, Ion 2.13 (*) 1.13 - 1.30 mmol/L   TCO2 32  0 - 100 mmol/L   Hemoglobin 12.2  12.0 - 15.0 g/dL   HCT 08.6  57.8 - 46.9 %   Ct Abdomen Pelvis Wo Contrast  09/24/2012  *RADIOLOGY REPORT*  Clinical Data: Lower abdominal pain.  Renal  failure.  Gout. Hypertension.  Prior anemia.  CT ABDOMEN AND PELVIS WITHOUT CONTRAST  Technique:  Multidetector CT imaging of the abdomen and pelvis was performed following the standard protocol without intravenous contrast.  Comparison: 03/26/2012  Findings: Scarring noted in the posterior basal segment right lower lobe along with chronic reticulonodular interstitial accentuation in the left lower lobe, unchanged from prior.  Right lower anterior rib deformity noted.  The visualized portion of the liver, spleen, pancreas, and adrenal glands appear unremarkable in noncontrast CT appearance.  The liver, spleen, and pancreas appear unremarkable noncontrast CT appearance.  A 1.7 x 2.0 cm left adrenal mass is not changed in size from 09/24/2012, and has internal density 30 HU, technically nonspecific.  Right adrenal gland normal.  Faint hypodensity right kidney upper pole, likely a cyst. Borderline proximal right hydroureter is again observed extending down to the iliac vessel crossover.  Stable punctate calcifications in the left kidney upper pole, likely small renal calculi in the 2- 3 mm range.  Scattered diverticular disease in the colon noted, with concentrated diverticulosis in the sigmoid colon, and abnormal focal wall thickening and surrounding stranding in the distal sigmoid colon shown on images 60-74 of series 2, favoring acute diverticulitis.  Gallbladder surgically absent.  Aortoiliac atherosclerotic vascular calcification noted.  Aortocaval node short axis 0.7 cm, image 35 of series 2.  Small pelvic lymph nodes do not appear pathologically enlarged by size criteria.  Terminal ileum unremarkable.  A small umbilical hernia contains adipose tissue.  Urinary bladder wall thickening likely secondary to nondistension.  Moderate lumbar spondylosis and degenerative disc disease noted.  IMPRESSION:  1.  Sigmoid colon diverticulosis with abnormal wall thickening and surrounding stranding in the distal sigmoid colon  favoring acute diverticulitis.  After resolution of symptoms, I would recommend colonoscopy if not recently performed to exclude the unlikely possibility of an inflamed sigmoid colon mass. 2.  Left adrenal mass is stable by my measurements and remains nonspecific.  The lack of growth over last 6 months is supportive that this probably does not represent a malignancy.  Noncontrast MRI could be utilized if clinically feasible for more definitive characterization. 3.  Left kidney upper pole nonobstructive nephrolithiasis. 4.  Stable mild right hydroureter to the level of the iliac vessel crossover, without obvious obstructing lesion observed on today's noncontrast exam. 5.  Moderate lumbar spondylosis and degenerative disc disease.   Original Report Authenticated By: Gaylyn Rong, M.D.     Patient continues to have pain at 4:0 PM MDM  Spoke with Dr.RAI   plan pain control, clear liquid diet, intravenous antibiotics Meds #1 diverticulitis #2 chronic renal insufficiency # 3 adrenal mass #4anemia       Doug Sou, MD 09/24/12 1659

## 2012-09-24 NOTE — H&P (Signed)
History and Physical       Hospital Admission Note Date: 09/24/2012  Patient name: [name] Medical record number: [id number] Date of birth: 1932-07-15 Age: 79 y.o. Gender: female PCP: Eino Farber, MD  Primary oncologist : Dr. Si Gaul  Primary nephrologist: Dr. Lowell Guitar   Chief Complaint:  Abdominal pain since yesterday  HPI: Patient is a 79 year old female with history of MGUS, anemia, CKD stage III-IV, diabetes mellitus, chronic pain with arthritis presented to ED with abdominal pain that started yesterday. Patient stated that she started having crampy abdominal pain 10 out of 10 intensity sharp, radiating towards the back, she also had some dysuria with the pain. Patient had no fevers or chills or vomiting. Patient took her regular pain medications with no significant relief. ED workup showed distal sigmoid colon diverticulitis. The patient had a colonoscopy within last 10 years and per patient, has been normal.   Review of Systems:  Constitutional: Denies fever, chills, diaphoresis, + poor appetite and fatigue.  HEENT: Denies photophobia, eye pain, redness, hearing loss, ear pain, congestion, sore throat, rhinorrhea, sneezing, mouth sores, trouble swallowing, neck pain, neck stiffness and tinnitus.   Respiratory: Denies SOB, DOE, cough, chest tightness,  and wheezing.   Cardiovascular: Denies chest pain, palpitations and leg swelling.  Gastrointestinal: See HPI Genitourinary: Denies urgency, frequency, hematuria, flank pain and + difficulty urinating.  Musculoskeletal: Denies myalgias,  joint swelling, arthralgias and gait problem.  chronic arthritis pain Skin: Denies pallor, rash and wound.  Neurological: Denies dizziness, seizures, syncope, weakness, light-headedness, numbness and headaches.  Hematological: Denies adenopathy. Easy bruising, personal or family bleeding history  Psychiatric/Behavioral:  Denies suicidal ideation, mood changes, confusion, nervousness, sleep disturbance and agitation  Past Medical History: Past Medical History  Diagnosis Date  . Renal failure   . Hypertension   . Diabetes mellitus   . Arthritis    Past Surgical History  Procedure Laterality Date  . Lobectomy    . Cholecystectomy    . Appendectomy    . Tubal ligation      Medications: Prior to Admission medications   Medication Sig Start Date End Date Taking? Authorizing Provider  albuterol (PROVENTIL HFA;VENTOLIN HFA) 108 (90 BASE) MCG/ACT inhaler Inhale 2 puffs into the lungs every 4 (four) hours as needed. For shortness of breath.   Yes Historical Provider, MD  allopurinol (ZYLOPRIM) 100 MG tablet Take 100 mg by mouth daily.   Yes Historical Provider, MD  amLODipine (NORVASC) 5 MG tablet Take 5 mg by mouth daily.   Yes Historical Provider, MD  b complex-vitamin c-folic acid (NEPHRO-VITE) 0.8 MG TABS Take 0.8 mg by mouth daily.   Yes Historical Provider, MD  diclofenac sodium (VOLTAREN) 1 % GEL Apply 2 g topically daily as needed (for arthritis pain).   Yes Historical Provider, MD  ferrous sulfate 325 (65 FE) MG EC tablet Take 650 mg by mouth daily with breakfast.    Yes Historical Provider, MD  furosemide (LASIX) 80 MG tablet Take 80 mg by mouth 2 (two) times daily.   Yes Historical Provider, MD  gabapentin (NEURONTIN) 100 MG capsule Take 100 mg by mouth 2 (two) times daily.   Yes Historical Provider, MD  glimepiride (AMARYL) 1 MG tablet Take 1 mg by mouth daily before breakfast.    Yes Historical Provider, MD  HYDROcodone-acetaminophen (NORCO) 7.5-325 MG per tablet Take 1 tablet by mouth daily as needed for pain.   Yes Historical Provider, MD  oxyCODONE (OXYCONTIN) 20 MG 12 hr tablet Take  20 mg by mouth every 12 (twelve) hours.   Yes Historical Provider, MD  pantoprazole (PROTONIX) 40 MG tablet Take 40 mg by mouth 2 (two) times daily.    Yes Historical Provider, MD  saxagliptin HCl (ONGLYZA) 2.5 MG  TABS tablet Take 2.5 mg by mouth daily.   Yes Historical Provider, MD    Allergies:   Allergies  Allergen Reactions  . Penicillins Cross Reactors Other (See Comments)    Tongue Swelling    Social History:  reports that she has never smoked. She has never used smokeless tobacco. She reports that she does not drink alcohol. Her drug history is not on file.  Family History: Patient denies any history of diverticulitis or GI cancers in the family  Physical Exam: Blood pressure 136/74, pulse 75, temperature 98.4 F (36.9 C), temperature source Oral, resp. rate 18, SpO2 98.00%. General: Alert, awake, oriented x3, in no acute distress. HEENT: normocephalic, atraumatic, anicteric sclera, pink conjunctiva, pupils equal and reactive to light and accomodation, oropharynx clear Neck: supple, no masses or lymphadenopathy, no goiter, no bruits  Heart: Regular rate and rhythm, without murmurs, rubs or gallops. Lungs: Clear to auscultation bilaterally, no wheezing, rales or rhonchi. Abdomen: Soft, nondistended, positive bowel sounds, no masses, tender in the left lower quadrant and suprapubic region/lower abdomen Extremities: No clubbing, cyanosis or edema with positive pedal pulses. Neuro: Grossly intact, no focal neurological deficits, strength 5/5 upper and lower extremities bilaterally Psych: alert and oriented x 3, normal mood and affect Skin: no rashes or lesions, warm and dry   LABS on Admission:  Basic Metabolic Panel:  Recent Labs Lab 09/23/12 0924 09/24/12 1122  NA 138 136  K 3.8 4.7  CL 94* 95*  CO2 29  --   GLUCOSE 145* 90  BUN 66* 59*  CREATININE 4.26* 3.80*  CALCIUM 8.9  8.7  --   PHOS 5.4*  --    Liver Function Tests:  Recent Labs Lab 09/23/12 0924  ALBUMIN 3.6   No results found for this basename: LIPASE, AMYLASE,  in the last 168 hours No results found for this basename: AMMONIA,  in the last 168 hours CBC:  Recent Labs Lab 09/24/12 1110 09/24/12 1122   WBC 7.7  --   NEUTROABS 6.1  --   HGB 10.8* 12.2  HCT 31.8* 36.0  MCV 85.0  --   PLT 227  --    Cardiac Enzymes: No results found for this basename: CKTOTAL, CKMB, CKMBINDEX, TROPONINI,  in the last 168 hours BNP: No components found with this basename: POCBNP,  CBG: No results found for this basename: GLUCAP,  in the last 168 hours   Radiological Exams on Admission: Ct Abdomen Pelvis Wo Contrast  09/24/2012   IMPRESSION:  1.  Sigmoid colon diverticulosis with abnormal wall thickening and surrounding stranding in the distal sigmoid colon favoring acute diverticulitis.  After resolution of symptoms, I would recommend colonoscopy if not recently performed to exclude the unlikely possibility of an inflamed sigmoid colon mass. 2.  Left adrenal mass is stable by my measurements and remains nonspecific.  The lack of growth over last 6 months is supportive that this probably does not represent a malignancy.  Noncontrast MRI could be utilized if clinically feasible for more definitive characterization. 3.  Left kidney upper pole nonobstructive nephrolithiasis. 4.  Stable mild right hydroureter to the level of the iliac vessel crossover, without obvious obstructing lesion observed on today's noncontrast exam. 5.  Moderate lumbar spondylosis and degenerative disc  disease.   Original Report Authenticated By: Gaylyn Rong, M.D.     Assessment/Plan Principal Problem:   Abdominal pain secondary to Acute diverticulitis: - Admit for observation, obtain lipase - placed on IV ciprofloxacin and Flagyl, clear liquid diet, gentle hydration  Active Problems:   MGUS (monoclonal gammopathy of unknown significance) - Follows Dr. Arbutus Ped, last visit on 09/22/2012, follow up visit in one month    CKD (chronic kidney disease) stage 3, GFR 30-59 ml/min - Patient is followed by nephrology, Dr. Lowell Guitar, currently creatinine at baseline, provide gentle hydration    Diabetes mellitus - Placed on sliding scale  insulin while inpatient  DVT prophylaxis: Lovenox  CODE STATUS: Full CODE STATUS  Further plan will depend as patient's clinical course evolves and further radiologic and laboratory data become available.   Time Spent on Admission: 1 hour  Lenetta Piche M.D. [facility] 09/24/2012, 5:27 PM Pager: [phone]  If 7PM-7AM, please contact night-coverage [email or web address] Password [email or web address]

## 2012-09-24 NOTE — ED Notes (Signed)
Admitting MD at the bedside.  

## 2012-09-25 LAB — GLUCOSE, CAPILLARY
Glucose-Capillary: 133 mg/dL — ABNORMAL HIGH (ref 70–99)
Glucose-Capillary: 107 mg/dL — ABNORMAL HIGH (ref 70–99)
Glucose-Capillary: 113 mg/dL — ABNORMAL HIGH (ref 70–99)

## 2012-09-25 LAB — BASIC METABOLIC PANEL
CO2: 29 mEq/L (ref 19–32)
Calcium: 8.5 mg/dL (ref 8.4–10.5)
GFR calc non Af Amer: 9 mL/min — ABNORMAL LOW (ref >90)
Glucose, Bld: 65 mg/dL — ABNORMAL LOW (ref 70–99)
Potassium: 4.2 mEq/L (ref 3.5–5.1)
Sodium: 136 mEq/L (ref 135–145)

## 2012-09-25 LAB — CBC
Hemoglobin: 9.1 g/dL — ABNORMAL LOW (ref 12.0–15.0)
MCV: 84.1 fL (ref 78.0–100.0)
Platelets: 224 10*3/uL (ref 150–400)
RBC: 3.14 MIL/uL — ABNORMAL LOW (ref 3.87–5.11)
WBC: 7.2 10*3/uL (ref 4.0–10.5)

## 2012-09-25 LAB — URINE CULTURE

## 2012-09-25 MED ORDER — FUROSEMIDE 80 MG PO TABS
80.0000 mg | ORAL_TABLET | Freq: Two times a day (BID) | ORAL | Status: DC
  Administered 2012-09-25 – 2012-09-26 (×3): 80 mg via ORAL
  Filled 2012-09-25 (×5): qty 1

## 2012-09-25 MED ORDER — INSULIN ASPART 100 UNIT/ML ~~LOC~~ SOLN
0.0000 [IU] | Freq: Three times a day (TID) | SUBCUTANEOUS | Status: DC
  Administered 2012-09-25 – 2012-09-26 (×2): 1 [IU] via SUBCUTANEOUS

## 2012-09-25 MED ORDER — ALLOPURINOL 100 MG PO TABS
100.0000 mg | ORAL_TABLET | Freq: Every day | ORAL | Status: DC
Start: 2012-09-25 — End: 2012-09-26
  Administered 2012-09-25 – 2012-09-26 (×2): 100 mg via ORAL
  Filled 2012-09-25 (×2): qty 1

## 2012-09-25 MED ORDER — FERROUS SULFATE 325 (65 FE) MG PO TBEC: 650.0000 mg | DELAYED_RELEASE_TABLET | Freq: Every day | ORAL | Status: DC

## 2012-09-25 MED ORDER — FERROUS SULFATE 325 (65 FE) MG PO TABS
650.0000 mg | ORAL_TABLET | Freq: Every day | ORAL | Status: DC
  Administered 2012-09-25 – 2012-09-26 (×2): 650 mg via ORAL
  Filled 2012-09-25 (×3): qty 2

## 2012-09-25 NOTE — Progress Notes (Signed)
Utilization review completed. Shadrack Brummitt, RN, BSN. 

## 2012-09-25 NOTE — Care Management Note (Signed)
    Page 1 of 1   09/26/2012     3:15:32 PM   CARE MANAGEMENT NOTE 09/26/2012  Patient:  [name], [name]   Account Number:  [id number]  Date Initiated:  09/25/2012  Documentation initiated by:  Letha Cape  Subjective/Objective Assessment:   dx acute divedrticulitis  admit- lives with grandson     Action/Plan:   Anticipated DC Date:  09/26/2012   Anticipated DC Plan:  HOME/SELF CARE      DC Planning Services  CM consult      Choice offered to / List presented to:             Status of service:  Completed, signed off Medicare Important Message given?   (If response is "NO", the following Medicare IM given date fields will be blank) Date Medicare IM given:   Date Additional Medicare IM given:    Discharge Disposition:  HOME/SELF CARE  Per UR Regulation:  Reviewed for med. necessity/level of care/duration of stay  If discussed at Long Length of Stay Meetings, dates discussed:    Comments:  09/26/12 15:14  Letha Cape RN, BSN [phone] patient for dc today, no needs anticipated.  09/25/12 16:08 Letha Cape RN, BSN [phone] patient lives with grandson, pta indep.  NCM will continue to follow for dc needs.

## 2012-09-25 NOTE — Progress Notes (Signed)
PATIENT DETAILS Name: [name] Age: 79 y.o. Sex: female Date of Birth: 1932/11/06 Admit Date: 09/24/2012 Admitting Physician Ripudeep Jenna Luo, MD RUE:[id number] Minda Meo, MD  Subjective: Feels much better-lower abd pain better, tolerating clears  Assessment/Plan: Principal Problem:   Acute diverticulitis -better -tolerating clears -afebrile, no leukocytosis -advance to full liquid -c/w Cipro and Flagyl  Active Problems:   MGUS (monoclonal gammopathy of unknown significance)  - Follows Dr. Arbutus Ped, last visit on 09/22/2012, follow up visit in one month -she is due to have a bone marrow bx and then follow up with Dr Arbutus Ped -will defer this to Dr Lauralee Evener be done in the outpatient setting    CKD (chronic kidney disease) stage 4 -creatinine close to usual baseline-with slight bump today -clinically euvolemic-stop IVF-resume home dosing of Lasix  Anemia -2/2 CKD -stable -monitor H/H periodically -resume FeSO4    Diabetes mellitus -CBG's stable -c/w SSI -resume oral hypoglycemic agents on discharge  Gout -stable  GERD -c/w PPI  Disposition: Remain inpatient  DVT Prophylaxis: Prophylactic Lovenox  Code Status: Full code   Family Communication No family at bedside  Procedures:  None  CONSULTS:  None   MEDICATIONS: Scheduled Meds: . amLODipine  5 mg Oral Daily  . antiseptic oral rinse  15 mL Mouth Rinse q12n4p  . chlorhexidine  15 mL Mouth Rinse BID  . ciprofloxacin  400 mg Intravenous Q24H  . enoxaparin (LOVENOX) injection  30 mg Subcutaneous Q24H  . gabapentin  100 mg Oral BID  . metronidazole  500 mg Intravenous Once  . metronidazole  500 mg Intravenous Q8H  . OxyCODONE  20 mg Oral Q12H  . pantoprazole  40 mg Oral BID   Continuous Infusions:  PRN Meds:.acetaminophen, acetaminophen, albuterol, HYDROcodone-acetaminophen, HYDROmorphone (DILAUDID) injection, ondansetron (ZOFRAN) IV, ondansetron  Antibiotics: Anti-infectives   Start     Dose/Rate Route Frequency Ordered Stop   09/25/12 1700  ciprofloxacin (CIPRO) IVPB 400 mg     400 mg 200 mL/hr over 60 Minutes Intravenous Every 24 hours 09/24/12 1738     09/24/12 2000  metroNIDAZOLE (FLAGYL) IVPB 500 mg     500 mg 100 mL/hr over 60 Minutes Intravenous Every 8 hours 09/24/12 1854     09/24/12 1645  metroNIDAZOLE (FLAGYL) IVPB 500 mg     500 mg 100 mL/hr over 60 Minutes Intravenous  Once 09/24/12 1632     09/24/12 1645  ciprofloxacin (CIPRO) IVPB 400 mg     400 mg 200 mL/hr over 60 Minutes Intravenous  Once 09/24/12 1632 09/24/12 1800       PHYSICAL EXAM: Vital signs in last 24 hours: Filed Vitals:   09/24/12 1730 09/24/12 1917 09/24/12 2053 09/25/12 0530  BP: 130/65 142/52 109/69 110/70  Pulse: 75 73 74 59  Temp: 98.1 F (36.7 C) 99.4 F (37.4 C) 99.7 F (37.6 C) 98.9 F (37.2 C)  TempSrc: Oral Oral Oral Oral  Resp:  18 20 18   Height:  5\' 5"  (1.651 m)    Weight:  108.863 kg (240 lb)    SpO2: 98% 93% 94% 91%    Weight change:  Filed Weights   09/24/12 1402 09/24/12 1917  Weight: 107.9 kg (237 lb 14 oz) 108.863 kg (240 lb)   Body mass index is 39.94 kg/(m^2).   Gen Exam: Awake and alert with clear speech.   Neck: Supple, No JVD.   Chest: B/L Clear.   CVS: S1 S2 Regular, no murmurs.  Abdomen: soft, BS +,mildly tender in the RLQ/Pelvic  area, non distended.  Extremities: no edema, lower extremities warm to touch. Neurologic: Non Focal.   Skin: No Rash.   Wounds: N/A.    Intake/Output from previous day:  Intake/Output Summary (Last 24 hours) at 09/25/12 0849 Last data filed at 09/25/12 0842  Gross per 24 hour  Intake   1385 ml  Output      0 ml  Net   1385 ml     LAB RESULTS: CBC  Recent Labs Lab 09/23/12 0930 09/24/12 1110 09/24/12 1122 09/24/12 1743  WBC  --  7.7  --  9.5  HGB 10.9* 10.8* 12.2 11.2*  HCT  --  31.8* 36.0 33.2*  PLT  --  227  --  232  MCV  --  85.0  --  84.5  MCH  --  28.9  --  28.5  MCHC  --  34.0   --  33.7  RDW  --  17.1*  --  17.2*  LYMPHSABS  --  1.0  --   --   MONOABS  --  0.5  --   --   EOSABS  --  0.1  --   --   BASOSABS  --  0.0  --   --     Chemistries   Recent Labs Lab 09/23/12 0924 09/24/12 1122 09/24/12 1743 09/25/12 0550  NA 138 136  --  136  K 3.8 4.7  --  4.2  CL 94* 95*  --  96  CO2 29  --   --  29  GLUCOSE 145* 90  --  65*  BUN 66* 59*  --  58*  CREATININE 4.26* 3.80* 3.95* 4.28*  CALCIUM 8.9  8.7  --   --  8.5    CBG: No results found for this basename: GLUCAP,  in the last 168 hours  GFR Estimated Creatinine Clearance: 13.3 ml/min (by C-G formula based on Cr of 4.28).  Coagulation profile No results found for this basename: INR, PROTIME,  in the last 168 hours  Cardiac Enzymes No results found for this basename: CK, CKMB, TROPONINI, MYOGLOBIN,  in the last 168 hours  No components found with this basename: POCBNP,  No results found for this basename: DDIMER,  in the last 72 hours No results found for this basename: HGBA1C,  in the last 72 hours No results found for this basename: CHOL, HDL, LDLCALC, TRIG, CHOLHDL, LDLDIRECT,  in the last 72 hours No results found for this basename: TSH, T4TOTAL, FREET3, T3FREE, THYROIDAB,  in the last 72 hours  Recent Labs  09/23/12 0924  TIBC 246*  IRON 61    Recent Labs  09/24/12 1743  LIPASE 21    Urine Studies No results found for this basename: UACOL, UAPR, USPG, UPH, UTP, UGL, UKET, UBIL, UHGB, UNIT, UROB, ULEU, UEPI, UWBC, URBC, UBAC, CAST, CRYS, UCOM, BILUA,  in the last 72 hours  MICROBIOLOGY: No results found for this or any previous visit (from the past 240 hour(s)).  RADIOLOGY STUDIES/RESULTS: Ct Abdomen Pelvis Wo Contrast  09/24/2012  *RADIOLOGY REPORT*  Clinical Data: Lower abdominal pain.  Renal failure.  Gout. Hypertension.  Prior anemia.  CT ABDOMEN AND PELVIS WITHOUT CONTRAST  Technique:  Multidetector CT imaging of the abdomen and pelvis was performed following the standard  protocol without intravenous contrast.  Comparison: 03/26/2012  Findings: Scarring noted in the posterior basal segment right lower lobe along with chronic reticulonodular interstitial accentuation in the left lower lobe, unchanged from prior.  Right  lower anterior rib deformity noted.  The visualized portion of the liver, spleen, pancreas, and adrenal glands appear unremarkable in noncontrast CT appearance.  The liver, spleen, and pancreas appear unremarkable noncontrast CT appearance.  A 1.7 x 2.0 cm left adrenal mass is not changed in size from 09/24/2012, and has internal density 30 HU, technically nonspecific.  Right adrenal gland normal.  Faint hypodensity right kidney upper pole, likely a cyst. Borderline proximal right hydroureter is again observed extending down to the iliac vessel crossover.  Stable punctate calcifications in the left kidney upper pole, likely small renal calculi in the 2- 3 mm range.  Scattered diverticular disease in the colon noted, with concentrated diverticulosis in the sigmoid colon, and abnormal focal wall thickening and surrounding stranding in the distal sigmoid colon shown on images 60-74 of series 2, favoring acute diverticulitis.  Gallbladder surgically absent.  Aortoiliac atherosclerotic vascular calcification noted.  Aortocaval node short axis 0.7 cm, image 35 of series 2.  Small pelvic lymph nodes do not appear pathologically enlarged by size criteria.  Terminal ileum unremarkable.  A small umbilical hernia contains adipose tissue.  Urinary bladder wall thickening likely secondary to nondistension.  Moderate lumbar spondylosis and degenerative disc disease noted.  IMPRESSION:  1.  Sigmoid colon diverticulosis with abnormal wall thickening and surrounding stranding in the distal sigmoid colon favoring acute diverticulitis.  After resolution of symptoms, I would recommend colonoscopy if not recently performed to exclude the unlikely possibility of an inflamed sigmoid colon  mass. 2.  Left adrenal mass is stable by my measurements and remains nonspecific.  The lack of growth over last 6 months is supportive that this probably does not represent a malignancy.  Noncontrast MRI could be utilized if clinically feasible for more definitive characterization. 3.  Left kidney upper pole nonobstructive nephrolithiasis. 4.  Stable mild right hydroureter to the level of the iliac vessel crossover, without obvious obstructing lesion observed on today's noncontrast exam. 5.  Moderate lumbar spondylosis and degenerative disc disease.   Original Report Authenticated By: Gaylyn Rong, M.D.     Jeoffrey Massed, MD  [facility] Pager:[phone]  If 7PM-7AM, please contact night-coverage [email or web address] Password [facility] 09/25/2012, 8:49 AM   LOS: 1 day

## 2012-09-26 ENCOUNTER — Telehealth: Payer: Self-pay | Admitting: Medical Oncology

## 2012-09-26 ENCOUNTER — Encounter (HOSPITAL_COMMUNITY): Payer: Self-pay | Admitting: Pharmacy Technician

## 2012-09-26 ENCOUNTER — Other Ambulatory Visit: Payer: Self-pay | Admitting: Radiology

## 2012-09-26 LAB — GLUCOSE, CAPILLARY
Glucose-Capillary: 105 mg/dL — ABNORMAL HIGH (ref 70–99)
Glucose-Capillary: 140 mg/dL — ABNORMAL HIGH (ref 70–99)

## 2012-09-26 MED ORDER — WHITE PETROLATUM GEL
Status: AC
  Filled 2012-09-26: qty 5

## 2012-09-26 MED ORDER — METRONIDAZOLE 500 MG PO TABS
500.0000 mg | ORAL_TABLET | Freq: Three times a day (TID) | ORAL | Status: DC
Start: 2012-09-26 — End: 2012-09-26

## 2012-09-26 MED ORDER — CIPROFLOXACIN HCL 500 MG PO TABS: 500.0000 mg | ORAL_TABLET | Freq: Every day | ORAL | Status: DC

## 2012-09-26 MED ORDER — CIPROFLOXACIN HCL 500 MG PO TABS
500.0000 mg | ORAL_TABLET | Freq: Every day | ORAL | Status: DC
Start: 2012-09-26 — End: 2012-10-22

## 2012-09-26 MED ORDER — METRONIDAZOLE 500 MG PO TABS: 500.0000 mg | ORAL_TABLET | Freq: Three times a day (TID) | ORAL | Status: DC

## 2012-09-26 NOTE — Progress Notes (Signed)
PATIENT DETAILS Name: [name] Age: 79 y.o. Sex: female Date of Birth: 1932-11-05 Admit Date: 09/24/2012 Admitting Physician Ripudeep Jenna Luo, MD [id number] Minda Meo, MD  Subjective: Continues to improve-pain slowly decreasing-now tolerable per patient. Tolerated Dys 3 diet this am. Last BM yesterday  Assessment/Plan: Principal Problem:   Acute diverticulitis -better-abd pain slowly resolving-some mild tenderness persists in the Right/Mid lower/mid quadrant area. Minimal tenderness in the LLQ area-but overall exam shows improvement-compared to the past few days -tolerated Dys 3 diet -afebrile, no leukocytosis -c/w Cipro and Flagyl-to complete a 10-14 day course. -need GI eval for colonoscopy-but can be done in the outpatient setting  Active Problems:   MGUS (monoclonal gammopathy of unknown significance)  - Follows Dr. Arbutus Ped, last visit on 09/22/2012, follow up visit in one month -she is due to have a bone marrow bx and then follow up with Dr Arbutus Ped -will defer this to Dr Lauralee Evener be done in the outpatient setting    CKD (chronic kidney disease) stage 4 -creatinine close to usual baseline -resume home dosing of Lasix  Anemia -2/2 CKD -stable -monitor H/H periodically -resume FeSO4    Diabetes mellitus -CBG's stable -c/w SSI -resume oral hypoglycemic agents on discharge  Gout -stable  GERD -c/w PPI  Disposition: Remain inpatient  DVT Prophylaxis: Prophylactic Lovenox  Code Status: Full code   Family Communication No family at bedside  Procedures:  None  CONSULTS:  None   MEDICATIONS: Scheduled Meds: . allopurinol  100 mg Oral Daily  . amLODipine  5 mg Oral Daily  . antiseptic oral rinse  15 mL Mouth Rinse q12n4p  . chlorhexidine  15 mL Mouth Rinse BID  . ciprofloxacin  400 mg Intravenous Q24H  . enoxaparin (LOVENOX) injection  30 mg Subcutaneous Q24H  . ferrous sulfate  650 mg Oral Q breakfast  . furosemide  80 mg Oral BID   . gabapentin  100 mg Oral BID  . insulin aspart  0-9 Units Subcutaneous TID WC  . metronidazole  500 mg Intravenous Once  . metronidazole  500 mg Intravenous Q8H  . OxyCODONE  20 mg Oral Q12H  . pantoprazole  40 mg Oral BID   Continuous Infusions:  PRN Meds:.acetaminophen, acetaminophen, albuterol, HYDROcodone-acetaminophen, HYDROmorphone (DILAUDID) injection, ondansetron (ZOFRAN) IV, ondansetron  Antibiotics: Anti-infectives   Start     Dose/Rate Route Frequency Ordered Stop   09/25/12 1700  ciprofloxacin (CIPRO) IVPB 400 mg     400 mg 200 mL/hr over 60 Minutes Intravenous Every 24 hours 09/24/12 1738     09/24/12 2000  metroNIDAZOLE (FLAGYL) IVPB 500 mg     500 mg 100 mL/hr over 60 Minutes Intravenous Every 8 hours 09/24/12 1854     09/24/12 1645  metroNIDAZOLE (FLAGYL) IVPB 500 mg     500 mg 100 mL/hr over 60 Minutes Intravenous  Once 09/24/12 1632     09/24/12 1645  ciprofloxacin (CIPRO) IVPB 400 mg     400 mg 200 mL/hr over 60 Minutes Intravenous  Once 09/24/12 1632 09/24/12 1800       PHYSICAL EXAM: Vital signs in last 24 hours: Filed Vitals:   09/25/12 1055 09/25/12 1358 09/25/12 2152 09/26/12 0538  BP: 142/79 117/54 116/55 139/72  Pulse: 66 66 58 75  Temp:  99 F (37.2 C) 98.7 F (37.1 C) 99.9 F (37.7 C)  TempSrc:  Oral Oral Oral  Resp:  18 18 18   Height:      Weight:      SpO2:  93%  93% 94%    Weight change:  Filed Weights   09/24/12 1402 09/24/12 1917  Weight: 107.9 kg (237 lb 14 oz) 108.863 kg (240 lb)   Body mass index is 39.94 kg/(m^2).   Gen Exam: Awake and alert with clear speech.   Neck: Supple, No JVD.   Chest: B/L Clear.   CVS: S1 S2 Regular, no murmurs.  Abdomen: soft, BS +,mildly tender in the RLQ/Pelvic area, non distended.  Extremities: no edema, lower extremities warm to touch. Neurologic: Non Focal.   Skin: No Rash.   Wounds: N/A.    Intake/Output from previous day:  Intake/Output Summary (Last 24 hours) at 09/26/12  1001 Last data filed at 09/25/12 1900  Gross per 24 hour  Intake    360 ml  Output    200 ml  Net    160 ml     LAB RESULTS: CBC  Recent Labs Lab 09/23/12 0930 09/24/12 1110 09/24/12 1122 09/24/12 1743 09/25/12 0550  WBC  --  7.7  --  9.5 7.2  HGB 10.9* 10.8* 12.2 11.2* 9.1*  HCT  --  31.8* 36.0 33.2* 26.4*  PLT  --  227  --  232 224  MCV  --  85.0  --  84.5 84.1  MCH  --  28.9  --  28.5 29.0  MCHC  --  34.0  --  33.7 34.5  RDW  --  17.1*  --  17.2* 17.6*  LYMPHSABS  --  1.0  --   --   --   MONOABS  --  0.5  --   --   --   EOSABS  --  0.1  --   --   --   BASOSABS  --  0.0  --   --   --     Chemistries   Recent Labs Lab 09/23/12 0924 09/24/12 1122 09/24/12 1743 09/25/12 0550  NA 138 136  --  136  K 3.8 4.7  --  4.2  CL 94* 95*  --  96  CO2 29  --   --  29  GLUCOSE 145* 90  --  65*  BUN 66* 59*  --  58*  CREATININE 4.26* 3.80* 3.95* 4.28*  CALCIUM 8.9  8.7  --   --  8.5    CBG:  Recent Labs Lab 09/25/12 1204 09/25/12 1725 09/25/12 2150 09/26/12 0742  GLUCAP 133* 107* 113* 105*    GFR Estimated Creatinine Clearance: 13.3 ml/min (by C-G formula based on Cr of 4.28).  Coagulation profile No results found for this basename: INR, PROTIME,  in the last 168 hours  Cardiac Enzymes No results found for this basename: CK, CKMB, TROPONINI, MYOGLOBIN,  in the last 168 hours  No components found with this basename: POCBNP,  No results found for this basename: DDIMER,  in the last 72 hours No results found for this basename: HGBA1C,  in the last 72 hours No results found for this basename: CHOL, HDL, LDLCALC, TRIG, CHOLHDL, LDLDIRECT,  in the last 72 hours No results found for this basename: TSH, T4TOTAL, FREET3, T3FREE, THYROIDAB,  in the last 72 hours No results found for this basename: VITAMINB12, FOLATE, FERRITIN, TIBC, IRON, RETICCTPCT,  in the last 72 hours  Recent Labs  09/24/12 1743  LIPASE 21    Urine Studies No results found for this  basename: UACOL, UAPR, USPG, UPH, UTP, UGL, UKET, UBIL, UHGB, UNIT, UROB, ULEU, UEPI, UWBC, URBC, UBAC, CAST, CRYS, UCOM, BILUA,  in the last 72 hours  MICROBIOLOGY: Recent Results (from the past 240 hour(s))  URINE CULTURE     Status: None   Collection Time    09/24/12 11:21 AM      Result Value Range Status   Specimen Description URINE, RANDOM   Final   Special Requests NONE   Final   Culture  Setup Time 09/24/2012 19:47   Final   Colony Count NO GROWTH   Final   Culture NO GROWTH   Final   Report Status 09/25/2012 FINAL   Final    RADIOLOGY STUDIES/RESULTS: Ct Abdomen Pelvis Wo Contrast  09/24/2012  *RADIOLOGY REPORT*  Clinical Data: Lower abdominal pain.  Renal failure.  Gout. Hypertension.  Prior anemia.  CT ABDOMEN AND PELVIS WITHOUT CONTRAST  Technique:  Multidetector CT imaging of the abdomen and pelvis was performed following the standard protocol without intravenous contrast.  Comparison: 03/26/2012  Findings: Scarring noted in the posterior basal segment right lower lobe along with chronic reticulonodular interstitial accentuation in the left lower lobe, unchanged from prior.  Right lower anterior rib deformity noted.  The visualized portion of the liver, spleen, pancreas, and adrenal glands appear unremarkable in noncontrast CT appearance.  The liver, spleen, and pancreas appear unremarkable noncontrast CT appearance.  A 1.7 x 2.0 cm left adrenal mass is not changed in size from 09/24/2012, and has internal density 30 HU, technically nonspecific.  Right adrenal gland normal.  Faint hypodensity right kidney upper pole, likely a cyst. Borderline proximal right hydroureter is again observed extending down to the iliac vessel crossover.  Stable punctate calcifications in the left kidney upper pole, likely small renal calculi in the 2- 3 mm range.  Scattered diverticular disease in the colon noted, with concentrated diverticulosis in the sigmoid colon, and abnormal focal wall thickening and  surrounding stranding in the distal sigmoid colon shown on images 60-74 of series 2, favoring acute diverticulitis.  Gallbladder surgically absent.  Aortoiliac atherosclerotic vascular calcification noted.  Aortocaval node short axis 0.7 cm, image 35 of series 2.  Small pelvic lymph nodes do not appear pathologically enlarged by size criteria.  Terminal ileum unremarkable.  A small umbilical hernia contains adipose tissue.  Urinary bladder wall thickening likely secondary to nondistension.  Moderate lumbar spondylosis and degenerative disc disease noted.  IMPRESSION:  1.  Sigmoid colon diverticulosis with abnormal wall thickening and surrounding stranding in the distal sigmoid colon favoring acute diverticulitis.  After resolution of symptoms, I would recommend colonoscopy if not recently performed to exclude the unlikely possibility of an inflamed sigmoid colon mass. 2.  Left adrenal mass is stable by my measurements and remains nonspecific.  The lack of growth over last 6 months is supportive that this probably does not represent a malignancy.  Noncontrast MRI could be utilized if clinically feasible for more definitive characterization. 3.  Left kidney upper pole nonobstructive nephrolithiasis. 4.  Stable mild right hydroureter to the level of the iliac vessel crossover, without obvious obstructing lesion observed on today's noncontrast exam. 5.  Moderate lumbar spondylosis and degenerative disc disease.   Original Report Authenticated By: Gaylyn Rong, M.D.     Jeoffrey Massed, MD  [facility] Pager:[phone]  If 7PM-7AM, please contact night-coverage [email or web address] Password [email or web address] 09/26/2012, 10:01 AM   LOS: 2 days

## 2012-09-26 NOTE — Telephone Encounter (Signed)
Question about scheduling

## 2012-09-26 NOTE — Discharge Summary (Signed)
PATIENT DETAILS Name: [name] Age: 79 y.o. Sex: female Date of Birth: 23-May-1933 MRN: [id number]. Admit Date: 09/24/2012 Admitting Physician: Cathren Harsh, MD [id number] Minda Meo, MD  Recommendations for Outpatient Follow-up:  1. Stay on a soft mechanical diet for 1-2 weeks 2. Please refer to GI for evaluation for colonoscopy 3. Follow left adrenal mass-stable for the past 6 months by CT Scan  PRIMARY DISCHARGE DIAGNOSIS:  Principal Problem:   Acute diverticulitis Active Problems:   MGUS (monoclonal gammopathy of unknown significance)   Abdominal pain, acute   CKD (chronic kidney disease) stage 3, GFR 30-59 ml/min   Diabetes mellitus      PAST MEDICAL HISTORY: Past Medical History  Diagnosis Date  . Renal failure   . Hypertension   . Diabetes mellitus   . Arthritis     DISCHARGE MEDICATIONS:   Medication List    TAKE these medications       albuterol 108 (90 BASE) MCG/ACT inhaler  Commonly known as:  PROVENTIL HFA;VENTOLIN HFA  Inhale 2 puffs into the lungs every 4 (four) hours as needed. For shortness of breath.     allopurinol 100 MG tablet  Commonly known as:  ZYLOPRIM  Take 100 mg by mouth daily.     amLODipine 5 MG tablet  Commonly known as:  NORVASC  Take 5 mg by mouth daily.     b complex-vitamin c-folic acid 0.8 MG Tabs  Take 0.8 mg by mouth daily.     ciprofloxacin 500 MG tablet  Commonly known as:  CIPRO  Take 1 tablet (500 mg total) by mouth daily.     diclofenac sodium 1 % Gel  Commonly known as:  VOLTAREN  Apply 2 g topically daily as needed (for arthritis pain).     ferrous sulfate 325 (65 FE) MG EC tablet  Take 650 mg by mouth daily with breakfast.     furosemide 80 MG tablet  Commonly known as:  LASIX  Take 80 mg by mouth 2 (two) times daily.     gabapentin 100 MG capsule  Commonly known as:  NEURONTIN  Take 100 mg by mouth 2 (two) times daily.     glimepiride 1 MG tablet  Commonly known as:  AMARYL  Take 1 mg  by mouth daily before breakfast.     HYDROcodone-acetaminophen 7.5-325 MG per tablet  Commonly known as:  NORCO  Take 1 tablet by mouth daily as needed for pain.     metroNIDAZOLE 500 MG tablet  Commonly known as:  FLAGYL  Take 1 tablet (500 mg total) by mouth 3 (three) times daily.     oxyCODONE 20 MG 12 hr tablet  Commonly known as:  OXYCONTIN  Take 20 mg by mouth every 12 (twelve) hours.     pantoprazole 40 MG tablet  Commonly known as:  PROTONIX  Take 40 mg by mouth 2 (two) times daily.     saxagliptin HCl 2.5 MG Tabs tablet  Commonly known as:  ONGLYZA  Take 2.5 mg by mouth daily.         BRIEF HPI:  See H&P, Labs, Consult and Test reports for all details in brief,Patient is a 79 year old female with history of MGUS, anemia, CKD stage III-IV, diabetes mellitus, chronic pain with arthritis presented to ED with abdominal pain that started 1 day prior to admission.Patient stated that she started having crampy abdominal pain 10 out of 10 intensity sharp, radiating towards the back, she also had some dysuria with  the pain. Patient had no fevers or chills or vomiting. Patient took her regular pain medications with no significant relief.  ED workup showed distal sigmoid colon diverticulitis. The patient had a colonoscopy within last 10 years and per patient, has been normal.   CONSULTATIONS:   None  PERTINENT RADIOLOGIC STUDIES: Ct Abdomen Pelvis Wo Contrast  09/24/2012  *RADIOLOGY REPORT*  Clinical Data: Lower abdominal pain.  Renal failure.  Gout. Hypertension.  Prior anemia.  CT ABDOMEN AND PELVIS WITHOUT CONTRAST  Technique:  Multidetector CT imaging of the abdomen and pelvis was performed following the standard protocol without intravenous contrast.  Comparison: 03/26/2012  Findings: Scarring noted in the posterior basal segment right lower lobe along with chronic reticulonodular interstitial accentuation in the left lower lobe, unchanged from prior.  Right lower anterior rib  deformity noted.  The visualized portion of the liver, spleen, pancreas, and adrenal glands appear unremarkable in noncontrast CT appearance.  The liver, spleen, and pancreas appear unremarkable noncontrast CT appearance.  A 1.7 x 2.0 cm left adrenal mass is not changed in size from 09/24/2012, and has internal density 30 HU, technically nonspecific.  Right adrenal gland normal.  Faint hypodensity right kidney upper pole, likely a cyst. Borderline proximal right hydroureter is again observed extending down to the iliac vessel crossover.  Stable punctate calcifications in the left kidney upper pole, likely small renal calculi in the 2- 3 mm range.  Scattered diverticular disease in the colon noted, with concentrated diverticulosis in the sigmoid colon, and abnormal focal wall thickening and surrounding stranding in the distal sigmoid colon shown on images 60-74 of series 2, favoring acute diverticulitis.  Gallbladder surgically absent.  Aortoiliac atherosclerotic vascular calcification noted.  Aortocaval node short axis 0.7 cm, image 35 of series 2.  Small pelvic lymph nodes do not appear pathologically enlarged by size criteria.  Terminal ileum unremarkable.  A small umbilical hernia contains adipose tissue.  Urinary bladder wall thickening likely secondary to nondistension.  Moderate lumbar spondylosis and degenerative disc disease noted.  IMPRESSION:  1.  Sigmoid colon diverticulosis with abnormal wall thickening and surrounding stranding in the distal sigmoid colon favoring acute diverticulitis.  After resolution of symptoms, I would recommend colonoscopy if not recently performed to exclude the unlikely possibility of an inflamed sigmoid colon mass. 2.  Left adrenal mass is stable by my measurements and remains nonspecific.  The lack of growth over last 6 months is supportive that this probably does not represent a malignancy.  Noncontrast MRI could be utilized if clinically feasible for more definitive  characterization. 3.  Left kidney upper pole nonobstructive nephrolithiasis. 4.  Stable mild right hydroureter to the level of the iliac vessel crossover, without obvious obstructing lesion observed on today's noncontrast exam. 5.  Moderate lumbar spondylosis and degenerative disc disease.   Original Report Authenticated By: Gaylyn Rong, M.D.      PERTINENT LAB RESULTS: CBC:  Recent Labs  09/24/12 1743 09/25/12 0550  WBC 9.5 7.2  HGB 11.2* 9.1*  HCT 33.2* 26.4*  PLT 232 224   CMET CMP     Component Value Date/Time   NA 136 09/25/2012 0550   NA 141 09/17/2012 1242   K 4.2 09/25/2012 0550   K 4.2 09/17/2012 1242   CL 96 09/25/2012 0550   CL 98 09/17/2012 1242   CO2 29 09/25/2012 0550   CO2 28 09/17/2012 1242   GLUCOSE 65* 09/25/2012 0550   GLUCOSE 159* 09/17/2012 1242   BUN 58* 09/25/2012 0550  BUN 74.0* 09/17/2012 1242   CREATININE 4.28* 09/25/2012 0550   CREATININE 4.6* 09/17/2012 1242   CALCIUM 8.5 09/25/2012 0550   CALCIUM 8.7 09/23/2012 0924   CALCIUM 9.1 09/17/2012 1242   PROT 8.1 09/17/2012 1242   PROT 7.5 03/26/2012 1643   ALBUMIN 3.6 09/23/2012 0924   ALBUMIN 3.4* 09/17/2012 1242   AST 19 09/17/2012 1242   AST 23 03/26/2012 1643   ALT 18 09/17/2012 1242   ALT 14 03/26/2012 1643   ALKPHOS 75 09/17/2012 1242   ALKPHOS 81 03/26/2012 1643   BILITOT 0.31 09/17/2012 1242   BILITOT 0.2* 03/26/2012 1643   GFRNONAA 9* 09/25/2012 0550   GFRAA 11* 09/25/2012 0550    GFR Estimated Creatinine Clearance: 13.3 ml/min (by C-G formula based on Cr of 4.28).  Recent Labs  09/24/12 1743  LIPASE 21   No results found for this basename: CKTOTAL, CKMB, CKMBINDEX, TROPONINI,  in the last 72 hours No components found with this basename: POCBNP,  No results found for this basename: DDIMER,  in the last 72 hours No results found for this basename: HGBA1C,  in the last 72 hours No results found for this basename: CHOL, HDL, LDLCALC, TRIG, CHOLHDL, LDLDIRECT,  in the last 72 hours No results  found for this basename: TSH, T4TOTAL, FREET3, T3FREE, THYROIDAB,  in the last 72 hours No results found for this basename: VITAMINB12, FOLATE, FERRITIN, TIBC, IRON, RETICCTPCT,  in the last 72 hours Coags: No results found for this basename: PT, INR,  in the last 72 hours Microbiology: Recent Results (from the past 240 hour(s))  URINE CULTURE     Status: None   Collection Time    09/24/12 11:21 AM      Result Value Range Status   Specimen Description URINE, RANDOM   Final   Special Requests NONE   Final   Culture  Setup Time 09/24/2012 19:47   Final   Colony Count NO GROWTH   Final   Culture NO GROWTH   Final   Report Status 09/25/2012 FINAL   Final     BRIEF HOSPITAL COURSE:   Principal Problem:   Acute diverticulitis -Patient was admitted, kept on clear liquids, placed on IV Cipro and Flagyl.  -With these measures, patient gradually improved, her abdominal pain slowly got much better. Her diet was advanced to full liquids yesterday, which she tolerated with no major issues. This am, she was advanced to a mech soft diet, which she has tolerated as well. She continues to have some mild tenderness in her lower abdominal area-but it so much better than just 24 hours back. Since she is tolerating diet and progressing well, with no major issues, it is thought that she can be discharged home and can be continued on oral antibiotics at home. She will need follow up with her PCP in 1 week, and will need referral to GI for consideration of a colonoscopy. This was explained to the patient in detail, she claims understanding  Active Problems:  MGUS (monoclonal gammopathy of unknown significance)  - Follows Dr. Arbutus Ped, last visit on 09/22/2012, follow up visit in one month  -she is due to have a bone marrow bx and then follow up with Dr Arbutus Ped  -will defer this to Dr Lauralee Evener be done in the outpatient setting  CKD (chronic kidney disease) stage 4  -creatinine close to usual baseline   -resume home dosing of Lasix -she is to follow up with her primary nephrologist at previously scheduled appointment  Anemia  -2/2 CKD  -stable  -monitor H/H periodically as outpatient -resume FeSO4 on discharge  Diabetes mellitus  -CBG's stable  -was on SSI while inpatient -resume oral hypoglycemic agents on discharge   Gout  -stable -c/w Allopurinol  GERD  -stable    TODAY-DAY OF DISCHARGE:  Subjective:   Teirra Propps today has no headache,no chest no new weakness tingling or numbness, feels much better wants to go home today.  Objective:   Blood pressure 139/72, pulse 75, temperature 99.9 F (37.7 C), temperature source Oral, resp. rate 18, height 5\' 5"  (1.651 m), weight 108.863 kg (240 lb), SpO2 94.00%.  Intake/Output Summary (Last 24 hours) at 09/26/12 1018 Last data filed at 09/25/12 1900  Gross per 24 hour  Intake    360 ml  Output    200 ml  Net    160 ml   Filed Weights   09/24/12 1402 09/24/12 1917  Weight: 107.9 kg (237 lb 14 oz) 108.863 kg (240 lb)    Exam Awake Alert, Oriented *3, No new F.N deficits, Normal affect [place].AT,PERRAL Supple Neck,No JVD, No cervical lymphadenopathy appriciated.  Symmetrical Chest wall movement, Good air movement bilaterally, CTAB RRR,No Gallops,Rubs or new Murmurs, No Parasternal Heave +ve B.Sounds, Abd Soft,Mildly tender in the lower abdominal area-more on the right >left, No organomegaly appriciated, No rebound -guarding or rigidity. No Cyanosis, Clubbing or edema, No new Rash or bruise  DISCHARGE CONDITION: Stable  DISPOSITION: Home   DISCHARGE INSTRUCTIONS:    Activity:  As tolerated   Diet recommendation: Diabetic Diet Heart Healthy diet Dysphagia  3      Discharge Orders   Future Appointments Provider Department Dept Phone   10/01/2012 9:00 AM [facility] Room [facility] [phone]   10/01/2012 11:00 AM [facility]-Ct [facility] [phone]   10/07/2012  9:45 AM [facility] Injection Room [facility] [phone]   10/22/2012 9:15 AM Si Gaul, MD [facility] [phone]   Future Orders Complete By Expires     Call MD for:  persistant nausea and vomiting  As directed     Call MD for:  severe uncontrolled pain  As directed     Call MD for:  temperature >100.4  As directed     Diet - low sodium heart healthy  As directed     Diet Carb Modified  As directed     Comments:      Stay on a soft mechanical diet for next 1-2 weeks    Increase activity slowly  As directed        Follow-up Information   Follow up with Eino Farber, MD.   Contact information:   [address] [place] [address] [phone]      Total Time spent on discharge equals 45 minutes.  SignedJeoffrey Massed 09/26/2012 10:18 AM

## 2012-09-26 NOTE — Progress Notes (Signed)
09/26/12 Patient being discharged home ,IV site removed, discharge instructions reviewed with patient.

## 2012-10-01 ENCOUNTER — Inpatient Hospital Stay (HOSPITAL_COMMUNITY): Admission: RE | Admit: 2012-10-01 | Payer: Medicare Other | Source: Ambulatory Visit

## 2012-10-01 ENCOUNTER — Ambulatory Visit (HOSPITAL_COMMUNITY): Admission: RE | Admit: 2012-10-01 | Payer: Medicare Other | Source: Ambulatory Visit

## 2012-10-02 ENCOUNTER — Other Ambulatory Visit (HOSPITAL_COMMUNITY): Payer: Medicare Other

## 2012-10-03 ENCOUNTER — Ambulatory Visit (HOSPITAL_COMMUNITY)
Admission: RE | Admit: 2012-10-03 | Discharge: 2012-10-03 | Disposition: A | Discharge status: Home / Self Care | Payer: Medicare Other | Source: Ambulatory Visit | Attending: Internal Medicine | Admitting: Internal Medicine

## 2012-10-03 ENCOUNTER — Encounter (HOSPITAL_COMMUNITY): Payer: Self-pay

## 2012-10-03 ENCOUNTER — Other Ambulatory Visit (HOSPITAL_COMMUNITY): Payer: Self-pay | Admitting: Interventional Radiology

## 2012-10-03 ENCOUNTER — Ambulatory Visit (HOSPITAL_COMMUNITY)
Admission: RE | Admit: 2012-10-03 | Discharge: 2012-10-03 | Disposition: A | Discharge status: Home / Self Care | Payer: Medicare Other | Source: Ambulatory Visit | Attending: Interventional Radiology | Admitting: Interventional Radiology

## 2012-10-03 DIAGNOSIS — D472 Monoclonal gammopathy: Secondary | ICD-10-CM | POA: Insufficient documentation

## 2012-10-03 DIAGNOSIS — Z9089 Acquired absence of other organs: Secondary | ICD-10-CM | POA: Insufficient documentation

## 2012-10-03 DIAGNOSIS — Z79899 Other long term (current) drug therapy: Secondary | ICD-10-CM | POA: Insufficient documentation

## 2012-10-03 DIAGNOSIS — D649 Anemia, unspecified: Secondary | ICD-10-CM | POA: Insufficient documentation

## 2012-10-03 DIAGNOSIS — E119 Type 2 diabetes mellitus without complications: Secondary | ICD-10-CM | POA: Insufficient documentation

## 2012-10-03 DIAGNOSIS — N189 Chronic kidney disease, unspecified: Secondary | ICD-10-CM | POA: Insufficient documentation

## 2012-10-03 DIAGNOSIS — I129 Hypertensive chronic kidney disease with stage 1 through stage 4 chronic kidney disease, or unspecified chronic kidney disease: Secondary | ICD-10-CM | POA: Insufficient documentation

## 2012-10-03 LAB — GLUCOSE, CAPILLARY: Glucose-Capillary: 94 mg/dL (ref 70–99)

## 2012-10-03 LAB — CBC
Hemoglobin: 9.6 g/dL — ABNORMAL LOW (ref 12.0–15.0)
RBC: 3.38 MIL/uL — ABNORMAL LOW (ref 3.87–5.11)
WBC: 8.6 10*3/uL (ref 4.0–10.5)

## 2012-10-03 LAB — BONE MARROW EXAM: Bone Marrow Exam: 280

## 2012-10-03 MED ORDER — FENTANYL CITRATE 0.05 MG/ML IJ SOLN
INTRAMUSCULAR | Status: AC
  Filled 2012-10-03: qty 6

## 2012-10-03 MED ORDER — MIDAZOLAM HCL 2 MG/2ML IJ SOLN
INTRAMUSCULAR | Status: AC | PRN
  Administered 2012-10-03 (×3): 1 mg via INTRAVENOUS

## 2012-10-03 MED ORDER — FENTANYL CITRATE 0.05 MG/ML IJ SOLN
INTRAMUSCULAR | Status: AC | PRN
  Administered 2012-10-03 (×2): 100 ug via INTRAVENOUS

## 2012-10-03 MED ORDER — LIDOCAINE HCL 1 % IJ SOLN
INTRAMUSCULAR | Status: AC
  Filled 2012-10-03: qty 20

## 2012-10-03 MED ORDER — SODIUM CHLORIDE 0.9 % IV SOLN: Freq: Once | INTRAVENOUS | Status: DC

## 2012-10-03 MED ORDER — MIDAZOLAM HCL 2 MG/2ML IJ SOLN
INTRAMUSCULAR | Status: AC
  Filled 2012-10-03: qty 6

## 2012-10-03 MED ORDER — LORAZEPAM 2 MG/ML IJ SOLN: INTRAMUSCULAR | Status: AC | PRN

## 2012-10-03 NOTE — Progress Notes (Signed)
Unable to draw labs w IV start. Rad notified

## 2012-10-03 NOTE — Procedures (Signed)
Technically successful CT guided bone marrow aspiration and biopsy of left iliac crest. No immediate complications. Awaiting pathology report.    

## 2012-10-03 NOTE — Procedures (Signed)
Successful US guided venipuncture for blood draw from right brachial vein.

## 2012-10-03 NOTE — Progress Notes (Signed)
Unable to draw enough blood w IV start

## 2012-10-03 NOTE — H&P (Signed)
Chief Complaint: "I'm here for a bone marrow biopsy" Referring Physician:Mohamed HPI: [name] is an 79 y.o. female with hx of monoclonal gammopathy She is referred for BM biopsy. She has had one in the past in 2012 and did well with it. PMHx and meds reviewed She is feeling well, had a bout of diverticulitis last week and is still completing her abx regimen, but feels much better, no fevers.  Past Medical History:  Past Medical History  Diagnosis Date  . Renal failure   . Hypertension   . Diabetes mellitus   . Arthritis     Past Surgical History:  Past Surgical History  Procedure Laterality Date  . Lobectomy    . Cholecystectomy    . Appendectomy    . Tubal ligation    . Fistula left arm Left 2011    never used    Family History: History reviewed. No pertinent family history.  Social History:  reports that she has never smoked. She has never used smokeless tobacco. She reports that she does not drink alcohol or use illicit drugs.  Allergies:  Allergies  Allergen Reactions  . Penicillins Cross Reactors Other (See Comments)    Tongue Swelling    Medications: albuterol (PROVENTIL HFA;VENTOLIN HFA) 108 (90 BASE) MCG/ACT inhaler (Taking) Sig - Route: Inhale 2 puffs into the lungs every 4 (four) hours as needed. For shortness of breath. - Inhalation Class: Historical Med Number of times this order has been changed since signing: 2 Order [address] allopurinol (ZYLOPRIM) 100 MG tablet (Taking) Sig - Route: Take 100 mg by mouth every morning. - Oral Class: Historical Med Number of times this order has been changed since signing: 2 Order [address] amLODipine (NORVASC) 5 MG tablet (Taking) Sig - Route: Take 5 mg by mouth every morning. - Oral Class: Historical Med Number of times this order has been changed since signing: 2 Order [address] b complex-vitamin c-folic acid (NEPHRO-VITE) 0.8 MG TABS (Taking) Sig - Route: Take 0.8 mg by mouth daily. - Oral Class: Historical Med  diclofenac sodium (VOLTAREN) 1 % GEL (Taking) Sig - Route: Apply 2 g topically daily as needed (for arthritis pain). On legs and shoulder - Topical Class: Historical Med Number of times this order has been changed since signing: 1 Order [address] ferrous sulfate 325 (65 FE) MG EC tablet (Taking) Sig - Route: Take 325 mg by mouth daily with breakfast. - Oral Class: Historical Med Number of times this order has been changed since signing: 3 Order [address] furosemide (LASIX) 80 MG tablet (Taking) Sig - Route: Take 80 mg by mouth 2 (two) times daily. - Oral Class: Historical Med gabapentin (NEURONTIN) 100 MG capsule (Taking) Sig - Route: Take 100 mg by mouth 2 (two) times daily. - Oral Class: Historical Med glimepiride (AMARYL) 1 MG tablet (Taking) Sig - Route: Take 1 mg by mouth daily before breakfast. - Oral Class: Historical Med Number of times this order has been changed since signing: 1 Order [address] HYDROcodone-acetaminophen (NORCO) 7.5-325 MG per tablet (Taking) Sig - Route: Take 1 tablet by mouth daily as needed for pain. - Oral Class: Historical Med pantoprazole (PROTONIX) 40 MG tablet (Taking) Sig - Route: Take 40 mg by mouth 2 (two) times daily. - Oral Class: Historical Med Number of times this order has been changed since signing: 1 Order [address] saxagliptin HCl (ONGLYZA) 2.5 MG TABS tablet (Taking) Sig - Route: Take 2.5 mg by mouth daily. - Oral Class:  Historical Med ciprofloxacin (CIPRO) 500 MG tablet 12 tablet 0 09/26/2012 Sig - Route: Take 1 tablet (500 mg total) by mouth daily. - Oral Class: Print Number of times this order has been changed since signing: 1 Order [address] metroNIDAZOLE (FLAGYL) 500 MG tablet 36 tablet 0 09/26/2012 Sig - Route: Take 1 tablet (500 mg total) by mouth 3 (three) times daily   Please HPI for pertinent positives, otherwise complete 10 system ROS negative.  Physical Exam: Blood pressure 129/62, pulse 70, temperature 98.4 F (36.9 C), temperature source  Oral, resp. rate 16, height 5\' 5"  (1.651 m), weight 237 lb (107.502 kg), SpO2 98.00%. Body mass index is 39.44 kg/(m^2).   General Appearance:  Alert, cooperative, no distress, appears stated age  Head:  Normocephalic, without obvious abnormality, atraumatic  ENT: Unremarkable  Neck: Supple, symmetrical, trachea midline,  Lungs:   Clear to auscultation bilaterally, no w/r/r, respirations unlabored without use of accessory muscles.  Chest Wall:  No tenderness or deformity  Heart:  Regular rate and rhythm, S1, S2 normal, no murmur, rub or gallop.   Abdomen:   Soft, non-tender, non distended. Bowel sounds active all four quadrants,  no masses, no organomegaly.  Neurologic: Normal affect, no gross deficits.   No results found for this or any previous visit (from the past 48 hour(s)). No results found.  Assessment/Plan Monoclonal gammopathy For CT guided BM biopsy today Discussed procedure, risks, complications. Labs pending Consent signed in chart  Brayton El PA-C 10/03/2012, 10:11 AM

## 2012-10-07 ENCOUNTER — Encounter (HOSPITAL_COMMUNITY)
Admission: RE | Admit: 2012-10-07 | Discharge: 2012-10-07 | Disposition: A | Discharge status: Home / Self Care | Payer: Medicare Other | Source: Ambulatory Visit | Attending: Nephrology | Admitting: Nephrology

## 2012-10-07 DIAGNOSIS — N184 Chronic kidney disease, stage 4 (severe): Secondary | ICD-10-CM | POA: Insufficient documentation

## 2012-10-07 DIAGNOSIS — D638 Anemia in other chronic diseases classified elsewhere: Secondary | ICD-10-CM | POA: Insufficient documentation

## 2012-10-07 LAB — POCT HEMOGLOBIN-HEMACUE: Hemoglobin: 11 g/dL — ABNORMAL LOW (ref 12.0–15.0)

## 2012-10-07 MED ORDER — DARBEPOETIN ALFA-POLYSORBATE 200 MCG/0.4ML IJ SOLN: 200.0000 ug | INTRAMUSCULAR | Status: DC

## 2012-10-20 ENCOUNTER — Other Ambulatory Visit (HOSPITAL_COMMUNITY): Payer: Self-pay

## 2012-10-21 ENCOUNTER — Encounter (HOSPITAL_COMMUNITY)
Admission: RE | Admit: 2012-10-21 | Discharge: 2012-10-21 | Disposition: A | Discharge status: Home / Self Care | Payer: Medicare Other | Source: Ambulatory Visit | Attending: Nephrology | Admitting: Nephrology

## 2012-10-21 LAB — IRON AND TIBC
Saturation Ratios: 33 % (ref 20–55)
UIBC: 143 ug/dL (ref 125–400)

## 2012-10-21 LAB — POCT HEMOGLOBIN-HEMACUE: Hemoglobin: 11.1 g/dL — ABNORMAL LOW (ref 12.0–15.0)

## 2012-10-21 MED ORDER — DARBEPOETIN ALFA-POLYSORBATE 200 MCG/0.4ML IJ SOLN: 200.0000 ug | INTRAMUSCULAR | Status: DC

## 2012-10-22 ENCOUNTER — Telehealth: Payer: Self-pay | Admitting: Internal Medicine

## 2012-10-22 ENCOUNTER — Encounter: Payer: Self-pay | Admitting: Internal Medicine

## 2012-10-22 ENCOUNTER — Ambulatory Visit (HOSPITAL_BASED_OUTPATIENT_CLINIC_OR_DEPARTMENT_OTHER): Payer: Medicare Other | Admitting: Internal Medicine

## 2012-10-22 VITALS — BP 134/78 | HR 67 | Temp 97.3°F | Resp 18 | Ht 65.0 in | Wt 232.5 lb

## 2012-10-22 DIAGNOSIS — D631 Anemia in chronic kidney disease: Secondary | ICD-10-CM

## 2012-10-22 DIAGNOSIS — N189 Chronic kidney disease, unspecified: Secondary | ICD-10-CM

## 2012-10-22 DIAGNOSIS — D472 Monoclonal gammopathy: Secondary | ICD-10-CM

## 2012-10-22 NOTE — Patient Instructions (Signed)
Follow up visit in 6 months. 

## 2012-10-22 NOTE — Progress Notes (Signed)
[facility] [phone]   [phone]  OFFICE PROGRESS NOTE  Eino Farber, MD [address] [place] [address]  DIAGNOSIS:  #1 monoclonal gammopathy of undetermined significance (MGUS).  #2 anemia of chronic disease secondary to chronic kidney disease.   CURRENT THERAPY: Observation.  INTERVAL HISTORY: [name] Chill 79 y.o. female returns to the clinic today for followup visit. The patient is feeling fine today with no specific complaints. She started exercising regularly and eating healthy diet and she lost some weight over the last few months. She denied having any significant chest pain, shortness breath, cough or hemoptysis. The patient had bone marrow biopsy and aspirate performed recently and she is here for evaluation and discussion of her biopsy results and recommendation regarding treatment of her condition.  MEDICAL HISTORY: Past Medical History  Diagnosis Date  . Renal failure   . Hypertension   . Diabetes mellitus   . Arthritis     ALLERGIES:  is allergic to penicillins cross reactors.  MEDICATIONS:  Current Outpatient Prescriptions  Medication Sig Dispense Refill  . albuterol (PROVENTIL HFA;VENTOLIN HFA) 108 (90 BASE) MCG/ACT inhaler Inhale 2 puffs into the lungs every 4 (four) hours as needed. For shortness of breath.      . allopurinol (ZYLOPRIM) 100 MG tablet Take 100 mg by mouth every morning.       Marland Kitchen amLODipine (NORVASC) 5 MG tablet Take 5 mg by mouth every morning.       Marland Kitchen b complex-vitamin c-folic acid (NEPHRO-VITE) 0.8 MG TABS Take 0.8 mg by mouth daily.      . diclofenac sodium (VOLTAREN) 1 % GEL Apply 2 g topically daily as needed (for arthritis pain). On legs and shoulder      . ferrous sulfate 325 (65 FE) MG EC tablet Take 325 mg by mouth daily with breakfast.       . furosemide (LASIX) 80 MG tablet Take 80 mg by mouth 2 (two) times daily.      Marland Kitchen gabapentin (NEURONTIN) 100 MG capsule Take  100 mg by mouth 2 (two) times daily.      Marland Kitchen glimepiride (AMARYL) 1 MG tablet Take 1 mg by mouth daily before breakfast.       . HYDROcodone-acetaminophen (NORCO) 7.5-325 MG per tablet Take 1 tablet by mouth daily as needed for pain.      . pantoprazole (PROTONIX) 40 MG tablet Take 40 mg by mouth 2 (two) times daily.       . saxagliptin HCl (ONGLYZA) 2.5 MG TABS tablet Take 2.5 mg by mouth daily.       No current facility-administered medications for this visit.    SURGICAL HISTORY:  Past Surgical History  Procedure Laterality Date  . Lobectomy    . Cholecystectomy    . Appendectomy    . Tubal ligation    . Fistula left arm Left 2011    never used    REVIEW OF SYSTEMS:  A comprehensive review of systems was negative.   PHYSICAL EXAMINATION: General appearance: alert, cooperative and no distress Head: Normocephalic, without obvious abnormality, atraumatic Neck: no adenopathy Lymph nodes: Cervical, supraclavicular, and axillary nodes normal. Resp: clear to auscultation bilaterally Cardio: regular rate and rhythm, S1, S2 normal, no murmur, click, rub or gallop GI: soft, non-tender; bowel sounds normal; no masses,  no organomegaly Extremities: extremities normal, atraumatic, no cyanosis or edema  ECOG PERFORMANCE STATUS: 1 - Symptomatic but completely ambulatory  Blood pressure  134/78, pulse 67, temperature 97.3 F (36.3 C), temperature source Oral, resp. rate 18, height 5\' 5"  (1.651 m), weight 232 lb 8 oz (105.461 kg).  LABORATORY DATA: Lab Results  Component Value Date   WBC 8.6 10/03/2012   HGB 11.1* 10/21/2012   HCT 28.3* 10/03/2012   MCV 83.7 10/03/2012   PLT 258 10/03/2012      Chemistry      Component Value Date/Time   NA 136 09/25/2012 0550   NA 141 09/17/2012 1242   K 4.2 09/25/2012 0550   K 4.2 09/17/2012 1242   CL 96 09/25/2012 0550   CL 98 09/17/2012 1242   CO2 29 09/25/2012 0550   CO2 28 09/17/2012 1242   BUN 58* 09/25/2012 0550   BUN 74.0* 09/17/2012 1242   CREATININE  4.28* 09/25/2012 0550   CREATININE 4.6* 09/17/2012 1242      Component Value Date/Time   CALCIUM 8.5 09/25/2012 0550   CALCIUM 8.7 09/23/2012 0924   CALCIUM 9.1 09/17/2012 1242   ALKPHOS 75 09/17/2012 1242   ALKPHOS 81 03/26/2012 1643   AST 19 09/17/2012 1242   AST 23 03/26/2012 1643   ALT 18 09/17/2012 1242   ALT 14 03/26/2012 1643   BILITOT 0.31 09/17/2012 1242   BILITOT 0.2* 03/26/2012 1643       RADIOGRAPHIC STUDIES: Ct Abdomen Pelvis Wo Contrast  09/24/2012   *RADIOLOGY REPORT*  Clinical Data: Lower abdominal pain.  Renal failure.  Gout. Hypertension.  Prior anemia.  CT ABDOMEN AND PELVIS WITHOUT CONTRAST  Technique:  Multidetector CT imaging of the abdomen and pelvis was performed following the standard protocol without intravenous contrast.  Comparison: 03/26/2012  Findings: Scarring noted in the posterior basal segment right lower lobe along with chronic reticulonodular interstitial accentuation in the left lower lobe, unchanged from prior.  Right lower anterior rib deformity noted.  The visualized portion of the liver, spleen, pancreas, and adrenal glands appear unremarkable in noncontrast CT appearance.  The liver, spleen, and pancreas appear unremarkable noncontrast CT appearance.  A 1.7 x 2.0 cm left adrenal mass is not changed in size from 09/24/2012, and has internal density 30 HU, technically nonspecific.  Right adrenal gland normal.  Faint hypodensity right kidney upper pole, likely a cyst. Borderline proximal right hydroureter is again observed extending down to the iliac vessel crossover.  Stable punctate calcifications in the left kidney upper pole, likely small renal calculi in the 2- 3 mm range.  Scattered diverticular disease in the colon noted, with concentrated diverticulosis in the sigmoid colon, and abnormal focal wall thickening and surrounding stranding in the distal sigmoid colon shown on images 60-74 of series 2, favoring acute diverticulitis.  Gallbladder surgically absent.   Aortoiliac atherosclerotic vascular calcification noted.  Aortocaval node short axis 0.7 cm, image 35 of series 2.  Small pelvic lymph nodes do not appear pathologically enlarged by size criteria.  Terminal ileum unremarkable.  A small umbilical hernia contains adipose tissue.  Urinary bladder wall thickening likely secondary to nondistension.  Moderate lumbar spondylosis and degenerative disc disease noted.  IMPRESSION:  1.  Sigmoid colon diverticulosis with abnormal wall thickening and surrounding stranding in the distal sigmoid colon favoring acute diverticulitis.  After resolution of symptoms, I would recommend colonoscopy if not recently performed to exclude the unlikely possibility of an inflamed sigmoid colon mass. 2.  Left adrenal mass is stable by my measurements and remains nonspecific.  The lack of growth over last 6 months is supportive that this probably does not  represent a malignancy.  Noncontrast MRI could be utilized if clinically feasible for more definitive characterization. 3.  Left kidney upper pole nonobstructive nephrolithiasis. 4.  Stable mild right hydroureter to the level of the iliac vessel crossover, without obvious obstructing lesion observed on today's noncontrast exam. 5.  Moderate lumbar spondylosis and degenerative disc disease.   Original Report Authenticated By: Gaylyn Rong, M.D.   Ir Venipuncture 61yrs/older By Md  10/03/2012   *RADIOLOGY REPORT*  Clinical Data: Poor venous access in need of blood draw for lab specimen.  The right arm was prepped with chlorhexidine, draped in the usual sterile fashion using maximum barrier technique (cap and mask, sterile gown, sterile gloves, large sterile sheet, hand hygiene and cutaneous antisepsis) and infiltrated locally with 1% Lidocaine.  Ultrasound demonstrated patency of the right brachial vein, and this was documented with an image.  Under real-time ultrasound guidance, this vein was accessed with a 21 gauge micropuncture needle  , approximately 3mL of blood aspirated for lab specimen. Needle withdrawn and pressure applied.  IMPRESSION: Successful right arm US guided venipuncture.  Read by Brayton El PA-C   Original Report Authenticated By: Tacey Ruiz, MD   Ir US Guide Vasc Access Right  10/03/2012   *RADIOLOGY REPORT*  Clinical Data: Poor venous access in need of blood draw for lab specimen.  The right arm was prepped with chlorhexidine, draped in the usual sterile fashion using maximum barrier technique (cap and mask, sterile gown, sterile gloves, large sterile sheet, hand hygiene and cutaneous antisepsis) and infiltrated locally with 1% Lidocaine.  Ultrasound demonstrated patency of the right brachial vein, and this was documented with an image.  Under real-time ultrasound guidance, this vein was accessed with a 21 gauge micropuncture needle , approximately 3mL of blood aspirated for lab specimen. Needle withdrawn and pressure applied.  IMPRESSION: Successful right arm US guided venipuncture.  Read by Brayton El PA-C   Original Report Authenticated By: Tacey Ruiz, MD   Ct Biopsy  10/03/2012   *RADIOLOGY REPORT*  Indication: Monoclonal gammopathy of unknown significance  CT GUIDED LEFT ILIAC BONE MARROW ASPIRATION AND BONE MARROW CORE BIOPSIES  Intravenous medications: Fentanyl 200 mcg IV; Versed 3 mg IV  Sedation time: 10 minutes  Contrast volume: None  Complications: None immediate  PROCEDURE/FINDINGS:  Informed consent was obtained from the patient following an explanation of the procedure, risks, benefits and alternatives. The patient understands, agrees and consents for the procedure. All questions were addressed.  A time out was performed prior to the initiation of the procedure.  The patient was positioned prone and noncontrast localization CT was performed of the pelvis to demonstrate the iliac marrow spaces.  The operative site was prepped and draped in the usual sterile fashion.  Under sterile conditions and local  anesthesia, an 11 gauge coaxial bone biopsy needle was advanced into the left iliac marrow space. Needle position was confirmed with CT imaging.  Initially, bone marrow aspiration was performed. Next, a bone marrow biopsy was obtained with the 11 gauge outer bone marrow device.  Samples were prepared with the cytotechnologist and deemed adequate.  The needle was removed intact.  Hemostasis was obtained with compression and a dressing was placed. The patient tolerated the procedure well without immediate post procedural complication.  IMPRESSION:  Successful CT guided left iliac bone marrow aspiration and core biopsies.   Original Report Authenticated By: Tacey Ruiz, MD    ASSESSMENT: This is a very pleasant 79 years old [place] American female  with history of monoclonal gammopathy of undetermined significance as well as anemia of chronic disease. Her bone marrow biopsy and aspirate showed only 5% plasma cells.   PLAN: I discussed the biopsy result with the patient. I recommended for her to continue on observation with repeat myeloma panel in 6 months. She was advised to call immediately if she has any concerning symptoms in the interval.  All questions were answered. The patient knows to call the clinic with any problems, questions or concerns. We can certainly see the patient much sooner if necessary.

## 2012-10-22 NOTE — Telephone Encounter (Signed)
gv pt appt schedule for November.

## 2012-11-04 ENCOUNTER — Encounter (HOSPITAL_COMMUNITY)
Admission: RE | Admit: 2012-11-04 | Discharge: 2012-11-04 | Disposition: A | Discharge status: Home / Self Care | Payer: Medicare Other | Source: Ambulatory Visit | Attending: Nephrology | Admitting: Nephrology

## 2012-11-04 DIAGNOSIS — N039 Chronic nephritic syndrome with unspecified morphologic changes: Secondary | ICD-10-CM | POA: Insufficient documentation

## 2012-11-04 DIAGNOSIS — Z5181 Encounter for therapeutic drug level monitoring: Secondary | ICD-10-CM | POA: Diagnosis not present

## 2012-11-04 DIAGNOSIS — D631 Anemia in chronic kidney disease: Secondary | ICD-10-CM | POA: Diagnosis not present

## 2012-11-04 DIAGNOSIS — D638 Anemia in other chronic diseases classified elsewhere: Secondary | ICD-10-CM | POA: Diagnosis present

## 2012-11-04 DIAGNOSIS — N184 Chronic kidney disease, stage 4 (severe): Secondary | ICD-10-CM | POA: Insufficient documentation

## 2012-11-04 LAB — POCT HEMOGLOBIN-HEMACUE: Hemoglobin: 9.5 g/dL — ABNORMAL LOW (ref 12.0–15.0)

## 2012-11-04 MED ORDER — DARBEPOETIN ALFA-POLYSORBATE 200 MCG/0.4ML IJ SOLN
200.0000 ug | INTRAMUSCULAR | Status: DC
  Administered 2012-11-04: 200 ug via SUBCUTANEOUS

## 2012-11-04 MED ORDER — DARBEPOETIN ALFA-POLYSORBATE 200 MCG/0.4ML IJ SOLN
INTRAMUSCULAR | Status: AC
  Filled 2012-11-04: qty 0.4

## 2012-11-07 ENCOUNTER — Encounter: Payer: Self-pay | Admitting: Internal Medicine

## 2012-11-18 ENCOUNTER — Encounter (HOSPITAL_COMMUNITY)
Admission: RE | Admit: 2012-11-18 | Discharge: 2012-11-18 | Disposition: A | Discharge status: Home / Self Care | Payer: Medicare Other | Source: Ambulatory Visit | Attending: Nephrology | Admitting: Nephrology

## 2012-11-18 DIAGNOSIS — Z5181 Encounter for therapeutic drug level monitoring: Secondary | ICD-10-CM | POA: Diagnosis not present

## 2012-11-18 LAB — IRON AND TIBC
Saturation Ratios: 20 % (ref 20–55)
TIBC: 239 ug/dL — ABNORMAL LOW (ref 250–470)
UIBC: 192 ug/dL (ref 125–400)

## 2012-11-18 MED ORDER — DARBEPOETIN ALFA-POLYSORBATE 200 MCG/0.4ML IJ SOLN
200.0000 ug | INTRAMUSCULAR | Status: DC
  Administered 2012-11-18: 200 ug via SUBCUTANEOUS

## 2012-11-18 MED ORDER — DARBEPOETIN ALFA-POLYSORBATE 200 MCG/0.4ML IJ SOLN
INTRAMUSCULAR | Status: AC
  Filled 2012-11-18: qty 0.4

## 2012-12-02 ENCOUNTER — Encounter (HOSPITAL_COMMUNITY)
Admission: RE | Admit: 2012-12-02 | Discharge: 2012-12-02 | Disposition: A | Discharge status: Home / Self Care | Payer: Medicare Other | Source: Ambulatory Visit | Attending: Nephrology | Admitting: Nephrology

## 2012-12-02 DIAGNOSIS — D638 Anemia in other chronic diseases classified elsewhere: Secondary | ICD-10-CM | POA: Insufficient documentation

## 2012-12-02 DIAGNOSIS — N184 Chronic kidney disease, stage 4 (severe): Secondary | ICD-10-CM | POA: Insufficient documentation

## 2012-12-02 LAB — RENAL FUNCTION PANEL
CO2: 28 mEq/L (ref 19–32)
GFR calc Af Amer: 10 mL/min — ABNORMAL LOW (ref >90)
Glucose, Bld: 162 mg/dL — ABNORMAL HIGH (ref 70–99)
Potassium: 3.6 mEq/L (ref 3.5–5.1)
Sodium: 140 mEq/L (ref 135–145)

## 2012-12-02 LAB — POCT HEMOGLOBIN-HEMACUE: Hemoglobin: 10.8 g/dL — ABNORMAL LOW (ref 12.0–15.0)

## 2012-12-02 LAB — FERRITIN: Ferritin: 957 ng/mL — ABNORMAL HIGH (ref 10–291)

## 2012-12-02 LAB — IRON AND TIBC: Iron: 50 ug/dL (ref 42–135)

## 2012-12-02 MED ORDER — DARBEPOETIN ALFA-POLYSORBATE 200 MCG/0.4ML IJ SOLN
200.0000 ug | INTRAMUSCULAR | Status: DC
  Administered 2012-12-02: 200 ug via SUBCUTANEOUS

## 2012-12-02 MED ORDER — DARBEPOETIN ALFA-POLYSORBATE 200 MCG/0.4ML IJ SOLN
INTRAMUSCULAR | Status: AC
  Filled 2012-12-02: qty 0.4

## 2012-12-03 LAB — PTH, INTACT AND CALCIUM: PTH: 481.7 pg/mL — ABNORMAL HIGH (ref 14.0–72.0)

## 2012-12-16 ENCOUNTER — Encounter (HOSPITAL_COMMUNITY)
Admission: RE | Admit: 2012-12-16 | Discharge: 2012-12-16 | Disposition: A | Discharge status: Home / Self Care | Payer: Medicare Other | Source: Ambulatory Visit | Attending: Nephrology | Admitting: Nephrology

## 2012-12-16 LAB — POCT HEMOGLOBIN-HEMACUE: Hemoglobin: 10.7 g/dL — ABNORMAL LOW (ref 12.0–15.0)

## 2012-12-16 MED ORDER — DARBEPOETIN ALFA-POLYSORBATE 200 MCG/0.4ML IJ SOLN
INTRAMUSCULAR | Status: AC
  Filled 2012-12-16: qty 0.4

## 2012-12-16 MED ORDER — DARBEPOETIN ALFA-POLYSORBATE 200 MCG/0.4ML IJ SOLN
200.0000 ug | INTRAMUSCULAR | Status: DC
  Administered 2012-12-16: 200 ug via SUBCUTANEOUS

## 2012-12-30 ENCOUNTER — Encounter (HOSPITAL_COMMUNITY)
Admission: RE | Admit: 2012-12-30 | Discharge: 2012-12-30 | Disposition: A | Discharge status: Home / Self Care | Payer: Medicare Other | Source: Ambulatory Visit | Attending: Nephrology | Admitting: Nephrology

## 2012-12-30 LAB — POCT HEMOGLOBIN-HEMACUE: Hemoglobin: 10.5 g/dL — ABNORMAL LOW (ref 12.0–15.0)

## 2012-12-30 MED ORDER — DARBEPOETIN ALFA-POLYSORBATE 200 MCG/0.4ML IJ SOLN
INTRAMUSCULAR | Status: AC
  Administered 2012-12-30: 200 ug via SUBCUTANEOUS
  Filled 2012-12-30: qty 0.4

## 2012-12-30 MED ORDER — DARBEPOETIN ALFA-POLYSORBATE 200 MCG/0.4ML IJ SOLN: 200.0000 ug | INTRAMUSCULAR | Status: DC

## 2013-01-13 ENCOUNTER — Other Ambulatory Visit: Payer: Self-pay | Admitting: *Deleted

## 2013-01-13 ENCOUNTER — Encounter (HOSPITAL_COMMUNITY)
Admission: RE | Admit: 2013-01-13 | Discharge: 2013-01-13 | Disposition: A | Discharge status: Home / Self Care | Payer: Medicare Other | Source: Ambulatory Visit | Attending: Nephrology | Admitting: Nephrology

## 2013-01-13 DIAGNOSIS — Z01810 Encounter for preprocedural cardiovascular examination: Secondary | ICD-10-CM

## 2013-01-13 DIAGNOSIS — N184 Chronic kidney disease, stage 4 (severe): Secondary | ICD-10-CM | POA: Insufficient documentation

## 2013-01-13 DIAGNOSIS — N185 Chronic kidney disease, stage 5: Secondary | ICD-10-CM

## 2013-01-13 DIAGNOSIS — D638 Anemia in other chronic diseases classified elsewhere: Secondary | ICD-10-CM | POA: Insufficient documentation

## 2013-01-13 LAB — IRON AND TIBC: Saturation Ratios: 23 % (ref 20–55)

## 2013-01-13 LAB — POCT HEMOGLOBIN-HEMACUE: Hemoglobin: 10.4 g/dL — ABNORMAL LOW (ref 12.0–15.0)

## 2013-01-13 MED ORDER — DARBEPOETIN ALFA-POLYSORBATE 200 MCG/0.4ML IJ SOLN
200.0000 ug | INTRAMUSCULAR | Status: DC
  Administered 2013-01-13: 200 ug via SUBCUTANEOUS

## 2013-01-13 MED ORDER — DARBEPOETIN ALFA-POLYSORBATE 200 MCG/0.4ML IJ SOLN
INTRAMUSCULAR | Status: AC
  Filled 2013-01-13: qty 0.4

## 2013-01-26 ENCOUNTER — Other Ambulatory Visit (HOSPITAL_COMMUNITY): Payer: Self-pay | Admitting: *Deleted

## 2013-01-27 ENCOUNTER — Encounter (HOSPITAL_COMMUNITY)
Admission: RE | Admit: 2013-01-27 | Discharge: 2013-01-27 | Disposition: A | Discharge status: Home / Self Care | Payer: Medicare Other | Source: Ambulatory Visit | Attending: Nephrology | Admitting: Nephrology

## 2013-01-27 MED ORDER — FERUMOXYTOL INJECTION 510 MG/17 ML
INTRAVENOUS | Status: AC
  Administered 2013-01-27: 510 mg via INTRAVENOUS
  Filled 2013-01-27: qty 17

## 2013-01-27 MED ORDER — DARBEPOETIN ALFA-POLYSORBATE 200 MCG/0.4ML IJ SOLN: 200.0000 ug | INTRAMUSCULAR | Status: DC

## 2013-01-27 MED ORDER — SODIUM CHLORIDE 0.9 % IV SOLN
INTRAVENOUS | Status: DC
  Administered 2013-01-27: 10:00:00 via INTRAVENOUS

## 2013-01-27 MED ORDER — FERUMOXYTOL INJECTION 510 MG/17 ML: 510.0000 mg | INTRAVENOUS | Status: DC

## 2013-02-03 ENCOUNTER — Encounter (HOSPITAL_COMMUNITY)
Admission: RE | Admit: 2013-02-03 | Discharge: 2013-02-03 | Disposition: A | Discharge status: Home / Self Care | Payer: Medicare Other | Source: Ambulatory Visit | Attending: Nephrology | Admitting: Nephrology

## 2013-02-03 DIAGNOSIS — N184 Chronic kidney disease, stage 4 (severe): Secondary | ICD-10-CM | POA: Insufficient documentation

## 2013-02-03 DIAGNOSIS — D638 Anemia in other chronic diseases classified elsewhere: Secondary | ICD-10-CM | POA: Insufficient documentation

## 2013-02-03 MED ORDER — FERUMOXYTOL INJECTION 510 MG/17 ML: 510.0000 mg | INTRAVENOUS | Status: DC

## 2013-02-03 MED ORDER — SODIUM CHLORIDE 0.9 % IV SOLN
INTRAVENOUS | Status: DC
  Administered 2013-02-03: 09:00:00 via INTRAVENOUS

## 2013-02-03 MED ORDER — FERUMOXYTOL INJECTION 510 MG/17 ML
INTRAVENOUS | Status: AC
  Administered 2013-02-03: 510 mg
  Filled 2013-02-03: qty 17

## 2013-02-10 ENCOUNTER — Encounter (HOSPITAL_COMMUNITY)
Admission: RE | Admit: 2013-02-10 | Discharge: 2013-02-10 | Disposition: A | Discharge status: Home / Self Care | Payer: Medicare Other | Source: Ambulatory Visit | Attending: Nephrology | Admitting: Nephrology

## 2013-02-10 LAB — IRON AND TIBC
Iron: 168 ug/dL — ABNORMAL HIGH (ref 42–135)
UIBC: 79 ug/dL — ABNORMAL LOW (ref 125–400)

## 2013-02-10 LAB — FERRITIN: Ferritin: 2276 ng/mL — ABNORMAL HIGH (ref 10–291)

## 2013-02-10 MED ORDER — DARBEPOETIN ALFA-POLYSORBATE 200 MCG/0.4ML IJ SOLN: 200.0000 ug | INTRAMUSCULAR | Status: DC

## 2013-02-24 ENCOUNTER — Encounter (HOSPITAL_COMMUNITY)
Admission: RE | Admit: 2013-02-24 | Discharge: 2013-02-24 | Disposition: A | Discharge status: Home / Self Care | Payer: Medicare Other | Source: Ambulatory Visit | Attending: Nephrology | Admitting: Nephrology

## 2013-02-24 MED ORDER — DARBEPOETIN ALFA-POLYSORBATE 200 MCG/0.4ML IJ SOLN: 200.0000 ug | INTRAMUSCULAR | Status: DC

## 2013-03-02 ENCOUNTER — Encounter: Payer: Self-pay | Admitting: Vascular Surgery

## 2013-03-03 ENCOUNTER — Ambulatory Visit (INDEPENDENT_AMBULATORY_CARE_PROVIDER_SITE_OTHER): Payer: Medicare Other | Admitting: Vascular Surgery

## 2013-03-03 ENCOUNTER — Ambulatory Visit (INDEPENDENT_AMBULATORY_CARE_PROVIDER_SITE_OTHER)
Admission: RE | Admit: 2013-03-03 | Discharge: 2013-03-03 | Disposition: A | Discharge status: Home / Self Care | Payer: Medicare Other | Source: Ambulatory Visit | Attending: Vascular Surgery | Admitting: Vascular Surgery

## 2013-03-03 ENCOUNTER — Ambulatory Visit (HOSPITAL_COMMUNITY)
Admission: RE | Admit: 2013-03-03 | Discharge: 2013-03-03 | Disposition: A | Discharge status: Home / Self Care | Payer: Medicare Other | Source: Ambulatory Visit | Attending: Vascular Surgery | Admitting: Vascular Surgery

## 2013-03-03 ENCOUNTER — Encounter: Payer: Self-pay | Admitting: Vascular Surgery

## 2013-03-03 VITALS — BP 137/65 | HR 83 | Resp 18 | Ht 65.0 in | Wt 216.0 lb

## 2013-03-03 DIAGNOSIS — N185 Chronic kidney disease, stage 5: Secondary | ICD-10-CM

## 2013-03-03 DIAGNOSIS — Z01810 Encounter for preprocedural cardiovascular examination: Secondary | ICD-10-CM

## 2013-03-03 DIAGNOSIS — N186 End stage renal disease: Secondary | ICD-10-CM

## 2013-03-03 NOTE — Progress Notes (Signed)
[facility]   Patient name: [name] MRN: [id number] DOB: 10/03/1932 Sex: female   Referred by: [name]  Reason for referral:  Chief Complaint  Patient presents with  . New Evaluation    Evaluation for new access  referred by Dr [name]    HISTORY OF PRESENT ILLNESS: The patient presents today for discussion of AV access for hemodialysis. She has a long history of moderate to severe renal insufficiency. She was a former patient of Dr. Leretha Rice. She had placement of a left forearm loop graft by Dr. Hart Rice in 2011. She had [name] reocclusion was taken back to the operating room by myself for revision. She had subsequent reocclusion. She has remained stable with no need for hemodialysis in the ensuing 3+ years. She has had some progression is seen today for further discussion. Interestingly she does report persistent discomfort at the incision site since her initial surgery in 2011. She is asking whether removal of her prosthetic graft to improve this I explained that it would not.  Past Medical History  Diagnosis Date  . Renal failure   . Hypertension   . Diabetes mellitus   . Arthritis     Past Surgical History  Procedure Laterality Date  . Lobectomy    . Cholecystectomy    . Appendectomy    . Tubal ligation    . Fistula left arm Left 2011    never used    History   Social History  . Marital Status: Widowed    Spouse Name: N/A    Number of Children: N/A  . Years of Education: N/A   Occupational History  . Not on file.   Social History Main Topics  . Smoking status: Never Smoker   . Smokeless tobacco: Never Used  . Alcohol Use: No  . Drug Use: No  . Sexual Activity: Not on file   Other Topics Concern  . Not on file   Social History Narrative  . No narrative on file    History reviewed. No pertinent family history.  Allergies as of 03/03/2013 - Review Complete 03/03/2013  Allergen Reaction Noted  . Penicillins cross  reactors Other (See Comments) 04/16/2011    Current Outpatient Prescriptions on File Prior to Visit  Medication Sig Dispense Refill  . albuterol (PROVENTIL HFA;VENTOLIN HFA) 108 (90 BASE) MCG/ACT inhaler Inhale 2 puffs into the lungs every 4 (four) hours as needed. For shortness of breath.      . allopurinol (ZYLOPRIM) 100 MG tablet Take 100 mg by mouth every morning.       Marland Kitchen amLODipine (NORVASC) 5 MG tablet Take 5 mg by mouth every morning.       Marland Kitchen b complex-vitamin c-folic acid (NEPHRO-VITE) 0.8 MG TABS Take 0.8 mg by mouth daily.      . calcitRIOL (ROCALTROL) 0.5 MCG capsule Take 0.5 mcg by mouth daily.      . diclofenac sodium (VOLTAREN) 1 % GEL Apply 2 g topically daily as needed (for arthritis pain). On legs and shoulder      . ferrous sulfate 325 (65 FE) MG EC tablet Take 325 mg by mouth daily with breakfast.       . furosemide (LASIX) 80 MG tablet Take 80 mg by mouth 2 (two) times daily.      Marland Kitchen gabapentin (NEURONTIN) 100 MG capsule Take 100 mg by mouth 2 (two) times daily.      Marland Kitchen glimepiride (AMARYL) 1 MG tablet Take 1 mg by  mouth daily before breakfast.       . HYDROcodone-acetaminophen (NORCO) 7.5-325 MG per tablet Take 1 tablet by mouth daily as needed for pain.      . pantoprazole (PROTONIX) 40 MG tablet Take 40 mg by mouth 2 (two) times daily.       . saxagliptin HCl (ONGLYZA) 2.5 MG TABS tablet Take 2.5 mg by mouth daily.       No current facility-administered medications on file prior to visit.     REVIEW OF SYSTEMS:  Positives indicated with an "X"  CARDIOVASCULAR:  [ ]  chest pain   [ ]  chest pressure   [ ]  palpitations   [ ]  orthopnea   [ ]  dyspnea on exertion   [ ]  claudication   [ ]  rest pain   [x ] DVT   [ ]  phlebitis PULMONARY:   [ ]  productive cough   [x ] asthma   [ ]  wheezing NEUROLOGIC:   [ ]  weakness  [ ]  paresthesias  [ ]  aphasia  [ ]  amaurosis  [ ]  dizziness HEMATOLOGIC:   [ ]  bleeding problems   [ ]  clotting disorders MUSCULOSKELETAL:  [ ]  joint pain   [ ]   joint swelling GASTROINTESTINAL: [ ]   blood in stool  [ ]   hematemesis GENITOURINARY:  [ ]   dysuria  [ ]   hematuria PSYCHIATRIC:  [ ]  history of major depression INTEGUMENTARY:  [ ]  rashes  [ ]  ulcers CONSTITUTIONAL:  [ ]  fever   [ ]  chills  PHYSICAL EXAMINATION:  General: The patient is a well-nourished female, in no acute distress. Vital signs are BP 137/65  Pulse 83  Resp 18  Ht 5\' 5"  (1.651 m)  Wt 216 lb (97.977 kg)  BMI 35.94 kg/m2 Pulmonary: There is a good air exchange bilaterally   Musculoskeletal: There are no major deformities.  There is no significant extremity pain. Neurologic: No focal weakness or paresthesias are detected, Skin: There are no ulcer or rashes noted. Psychiatric: The patient has normal affect. Cardiovascular: 2+ radial pulses bilaterally She does have a nonfunctional left forearm loop graft.   [facility] Vascular Lab Studies:  Ordered and Independently Reviewed reveals normal arterial flow in her upper extremities with triphasic wave form at the wrist bilaterally. She does have extremely small surface veins bilaterall  Impression and Plan:  Monocryl insufficiency with failed access attempts in 2011. Portion she is not required renal replacement since that time. Discussion with the patient and her daughter present explaining at her next suggested option would be left upper arm AV graft. She is not a candidate for fistula creation due to small vein size. We will defer timing of this to Dr. Lowell Rice. I explained we would only need 3-4 weeks of healing prior to being able to use the graft. We will place a left upper arm AV Gore-Tex graft when Dr. Lowell Rice feels timing is appropriate    [name] [facility] Office: [phone]

## 2013-03-10 ENCOUNTER — Encounter (HOSPITAL_COMMUNITY): Payer: Medicare Other

## 2013-03-20 ENCOUNTER — Other Ambulatory Visit (HOSPITAL_COMMUNITY): Payer: Self-pay

## 2013-03-23 ENCOUNTER — Encounter (HOSPITAL_COMMUNITY)
Admission: RE | Admit: 2013-03-23 | Discharge: 2013-03-23 | Disposition: A | Discharge status: Home / Self Care | Payer: Medicare Other | Source: Ambulatory Visit | Attending: Nephrology | Admitting: Nephrology

## 2013-03-23 DIAGNOSIS — D638 Anemia in other chronic diseases classified elsewhere: Secondary | ICD-10-CM | POA: Insufficient documentation

## 2013-03-23 DIAGNOSIS — N184 Chronic kidney disease, stage 4 (severe): Secondary | ICD-10-CM | POA: Insufficient documentation

## 2013-03-23 LAB — RENAL FUNCTION PANEL
CO2: 28 mEq/L (ref 19–32)
Calcium: 9.6 mg/dL (ref 8.4–10.5)
GFR calc Af Amer: 9 mL/min — ABNORMAL LOW (ref >90)
Glucose, Bld: 176 mg/dL — ABNORMAL HIGH (ref 70–99)
Sodium: 137 mEq/L (ref 135–145)

## 2013-03-23 LAB — FERRITIN: Ferritin: 1859 ng/mL — ABNORMAL HIGH (ref 10–291)

## 2013-03-23 LAB — IRON AND TIBC: Iron: 111 ug/dL (ref 42–135)

## 2013-03-23 MED ORDER — DARBEPOETIN ALFA-POLYSORBATE 200 MCG/0.4ML IJ SOLN
INTRAMUSCULAR | Status: AC
  Filled 2013-03-23: qty 0.4

## 2013-03-23 MED ORDER — DARBEPOETIN ALFA-POLYSORBATE 200 MCG/0.4ML IJ SOLN
200.0000 ug | INTRAMUSCULAR | Status: DC
  Administered 2013-03-23: 200 ug via SUBCUTANEOUS

## 2013-04-15 ENCOUNTER — Telehealth: Payer: Self-pay | Admitting: Internal Medicine

## 2013-04-15 NOTE — Telephone Encounter (Signed)
returned pt called to r/s appt....pt was not happy that she got notified on tuesday for a thursday appt.Julia KitchenMarland KitchenI explained that the automated system call 2 days in advance.  She was not happy and wanted to r/s lab and est a month out.  done

## 2013-04-16 ENCOUNTER — Other Ambulatory Visit: Payer: Medicare Other

## 2013-04-20 ENCOUNTER — Encounter (HOSPITAL_COMMUNITY)
Admission: RE | Admit: 2013-04-20 | Discharge: 2013-04-20 | Disposition: A | Discharge status: Home / Self Care | Payer: Medicare Other | Source: Ambulatory Visit | Attending: Nephrology | Admitting: Nephrology

## 2013-04-20 DIAGNOSIS — N184 Chronic kidney disease, stage 4 (severe): Secondary | ICD-10-CM | POA: Insufficient documentation

## 2013-04-20 DIAGNOSIS — D638 Anemia in other chronic diseases classified elsewhere: Secondary | ICD-10-CM | POA: Insufficient documentation

## 2013-04-20 LAB — IRON AND TIBC
Iron: 113 ug/dL (ref 42–135)
Saturation Ratios: 43 % (ref 20–55)
TIBC: 262 ug/dL (ref 250–470)
UIBC: 149 ug/dL (ref 125–400)

## 2013-04-20 LAB — FERRITIN: Ferritin: 2102 ng/mL — ABNORMAL HIGH (ref 10–291)

## 2013-04-20 LAB — POCT HEMOGLOBIN-HEMACUE: Hemoglobin: 10.7 g/dL — ABNORMAL LOW (ref 12.0–15.0)

## 2013-04-20 MED ORDER — DARBEPOETIN ALFA-POLYSORBATE 200 MCG/0.4ML IJ SOLN
200.0000 ug | INTRAMUSCULAR | Status: DC
  Administered 2013-04-20: 200 ug via SUBCUTANEOUS

## 2013-04-20 MED ORDER — DARBEPOETIN ALFA-POLYSORBATE 200 MCG/0.4ML IJ SOLN
INTRAMUSCULAR | Status: AC
  Filled 2013-04-20: qty 0.4

## 2013-04-22 ENCOUNTER — Ambulatory Visit: Payer: Medicare Other | Admitting: Internal Medicine

## 2013-05-08 ENCOUNTER — Other Ambulatory Visit: Payer: Self-pay

## 2013-05-09 ENCOUNTER — Encounter (HOSPITAL_COMMUNITY): Payer: Self-pay | Admitting: Pharmacy Technician

## 2013-05-14 ENCOUNTER — Encounter (HOSPITAL_COMMUNITY): Payer: Self-pay | Admitting: *Deleted

## 2013-05-14 MED ORDER — CEFUROXIME SODIUM 1.5 G IJ SOLR
1.5000 g | INTRAMUSCULAR | Status: AC
  Administered 2013-05-15: 1.5 g via INTRAVENOUS
  Filled 2013-05-14: qty 1.5

## 2013-05-14 MED ORDER — SODIUM CHLORIDE 0.9 % IV SOLN
INTRAVENOUS | Status: DC
  Administered 2013-05-15: 08:00:00 via INTRAVENOUS

## 2013-05-15 ENCOUNTER — Ambulatory Visit (HOSPITAL_COMMUNITY): Payer: Medicare Other

## 2013-05-15 ENCOUNTER — Ambulatory Visit (HOSPITAL_COMMUNITY): Payer: Medicare Other | Admitting: Certified Registered"

## 2013-05-15 ENCOUNTER — Ambulatory Visit (HOSPITAL_COMMUNITY)
Admission: RE | Admit: 2013-05-15 | Discharge: 2013-05-15 | Disposition: A | Discharge status: Home / Self Care | Payer: Medicare Other | Source: Ambulatory Visit | Attending: Vascular Surgery | Admitting: Vascular Surgery

## 2013-05-15 ENCOUNTER — Other Ambulatory Visit (HOSPITAL_COMMUNITY): Payer: Self-pay | Admitting: *Deleted

## 2013-05-15 ENCOUNTER — Encounter (HOSPITAL_COMMUNITY): Payer: Medicare Other | Admitting: Certified Registered"

## 2013-05-15 ENCOUNTER — Encounter (HOSPITAL_COMMUNITY)
Admission: RE | Disposition: A | Discharge status: Home / Self Care | Payer: Self-pay | Source: Ambulatory Visit | Attending: Vascular Surgery

## 2013-05-15 ENCOUNTER — Encounter (HOSPITAL_COMMUNITY): Payer: Self-pay | Admitting: *Deleted

## 2013-05-15 DIAGNOSIS — J4489 Other specified chronic obstructive pulmonary disease: Secondary | ICD-10-CM | POA: Insufficient documentation

## 2013-05-15 DIAGNOSIS — I10 Essential (primary) hypertension: Secondary | ICD-10-CM | POA: Insufficient documentation

## 2013-05-15 DIAGNOSIS — Z01818 Encounter for other preprocedural examination: Secondary | ICD-10-CM | POA: Insufficient documentation

## 2013-05-15 DIAGNOSIS — N186 End stage renal disease: Secondary | ICD-10-CM

## 2013-05-15 DIAGNOSIS — N185 Chronic kidney disease, stage 5: Secondary | ICD-10-CM

## 2013-05-15 DIAGNOSIS — Z01810 Encounter for preprocedural cardiovascular examination: Secondary | ICD-10-CM | POA: Insufficient documentation

## 2013-05-15 DIAGNOSIS — J449 Chronic obstructive pulmonary disease, unspecified: Secondary | ICD-10-CM | POA: Insufficient documentation

## 2013-05-15 DIAGNOSIS — E119 Type 2 diabetes mellitus without complications: Secondary | ICD-10-CM | POA: Insufficient documentation

## 2013-05-15 DIAGNOSIS — K219 Gastro-esophageal reflux disease without esophagitis: Secondary | ICD-10-CM | POA: Insufficient documentation

## 2013-05-15 DIAGNOSIS — N19 Unspecified kidney failure: Secondary | ICD-10-CM | POA: Insufficient documentation

## 2013-05-15 HISTORY — PX: AV FISTULA PLACEMENT: SHX1204

## 2013-05-15 HISTORY — DX: Anemia, unspecified: D64.9

## 2013-05-15 HISTORY — DX: Gastro-esophageal reflux disease without esophagitis: K21.9

## 2013-05-15 HISTORY — DX: Unspecified asthma, uncomplicated: J45.909

## 2013-05-15 HISTORY — DX: Type 2 diabetes mellitus with diabetic neuropathy, unspecified: E11.40

## 2013-05-15 HISTORY — DX: Chronic obstructive pulmonary disease, unspecified: J44.9

## 2013-05-15 LAB — GLUCOSE, CAPILLARY: Glucose-Capillary: 112 mg/dL — ABNORMAL HIGH (ref 70–99)

## 2013-05-15 LAB — POCT I-STAT 4, (NA,K, GLUC, HGB,HCT)
Glucose, Bld: 115 mg/dL — ABNORMAL HIGH (ref 70–99)
HCT: 27 % — ABNORMAL LOW (ref 36.0–46.0)
Hemoglobin: 9.2 g/dL — ABNORMAL LOW (ref 12.0–15.0)

## 2013-05-15 SURGERY — INSERTION OF ARTERIOVENOUS (AV) GORE-TEX GRAFT ARM
Anesthesia: Monitor Anesthesia Care | Site: Arm Upper | Laterality: Left

## 2013-05-15 MED ORDER — OXYCODONE HCL 5 MG/5ML PO SOLN: 5.0000 mg | Freq: Once | ORAL | Status: DC | PRN

## 2013-05-15 MED ORDER — HYDROMORPHONE HCL PF 1 MG/ML IJ SOLN
INTRAMUSCULAR | Status: AC
  Administered 2013-05-15: 0.5 mg via INTRAVENOUS
  Filled 2013-05-15: qty 1

## 2013-05-15 MED ORDER — SODIUM CHLORIDE 0.9 % IV SOLN
INTRAVENOUS | Status: DC | PRN
  Administered 2013-05-15: 11:00:00 via INTRAVENOUS

## 2013-05-15 MED ORDER — OXYCODONE HCL 5 MG PO TABS: 5.0000 mg | ORAL_TABLET | Freq: Once | ORAL | Status: DC | PRN

## 2013-05-15 MED ORDER — LIDOCAINE-EPINEPHRINE (PF) 1 %-1:200000 IJ SOLN
INTRAMUSCULAR | Status: AC
  Filled 2013-05-15: qty 10

## 2013-05-15 MED ORDER — SODIUM CHLORIDE 0.9 % IR SOLN
Status: DC | PRN
  Administered 2013-05-15: 12:00:00

## 2013-05-15 MED ORDER — ONDANSETRON HCL 4 MG/2ML IJ SOLN
INTRAMUSCULAR | Status: DC | PRN
  Administered 2013-05-15: 4 mg via INTRAVENOUS

## 2013-05-15 MED ORDER — PROMETHAZINE HCL 25 MG/ML IJ SOLN: 6.2500 mg | INTRAMUSCULAR | Status: DC | PRN

## 2013-05-15 MED ORDER — LIDOCAINE HCL (CARDIAC) 20 MG/ML IV SOLN
INTRAVENOUS | Status: DC | PRN
  Administered 2013-05-15: 70 mg via INTRAVENOUS

## 2013-05-15 MED ORDER — LIDOCAINE-EPINEPHRINE (PF) 1 %-1:200000 IJ SOLN
INTRAMUSCULAR | Status: DC | PRN
  Administered 2013-05-15: 30 mL

## 2013-05-15 MED ORDER — PHENYLEPHRINE HCL 10 MG/ML IJ SOLN
INTRAMUSCULAR | Status: DC | PRN
  Administered 2013-05-15 (×4): 40 ug via INTRAVENOUS

## 2013-05-15 MED ORDER — 0.9 % SODIUM CHLORIDE (POUR BTL) OPTIME
TOPICAL | Status: DC | PRN
  Administered 2013-05-15: 1000 mL

## 2013-05-15 MED ORDER — MIDAZOLAM HCL 5 MG/5ML IJ SOLN
INTRAMUSCULAR | Status: DC | PRN
  Administered 2013-05-15: 1 mg via INTRAVENOUS

## 2013-05-15 MED ORDER — FENTANYL CITRATE 0.05 MG/ML IJ SOLN
INTRAMUSCULAR | Status: DC | PRN
  Administered 2013-05-15 (×3): 50 ug via INTRAVENOUS

## 2013-05-15 MED ORDER — PROPOFOL 10 MG/ML IV BOLUS
INTRAVENOUS | Status: DC | PRN
  Administered 2013-05-15: 110 mg via INTRAVENOUS

## 2013-05-15 MED ORDER — HYDROMORPHONE HCL PF 1 MG/ML IJ SOLN
0.2500 mg | INTRAMUSCULAR | Status: DC | PRN
  Administered 2013-05-15 (×4): 0.5 mg via INTRAVENOUS

## 2013-05-15 MED ORDER — DEXTROSE 5 % IV SOLN
INTRAVENOUS | Status: DC | PRN
  Administered 2013-05-15: 12:00:00 via INTRAVENOUS

## 2013-05-15 MED ORDER — OXYCODONE-ACETAMINOPHEN 5-325 MG PO TABS: 1.0000 | ORAL_TABLET | Freq: Four times a day (QID) | ORAL | Status: DC | PRN

## 2013-05-15 SURGICAL SUPPLY — 48 items
ADH SKN CLS APL DERMABOND .7 (GAUZE/BANDAGES/DRESSINGS) ×1
APL SKNCLS STERI-STRIP NONHPOA (GAUZE/BANDAGES/DRESSINGS)
ARMBAND PINK RESTRICT EXTREMIT (MISCELLANEOUS) ×2 IMPLANT
BENZOIN TINCTURE PRP APPL 2/3 (GAUZE/BANDAGES/DRESSINGS) ×1 IMPLANT
CANISTER SUCTION 2500CC (MISCELLANEOUS) ×2 IMPLANT
CLIP LIGATING EXTRA MED SLVR (CLIP) ×1 IMPLANT
CLIP LIGATING EXTRA SM BLUE (MISCELLANEOUS) ×1 IMPLANT
CLIP TI MEDIUM 6 (CLIP) ×1 IMPLANT
CLIP TI MEDIUM LARGE 6 (CLIP) ×1 IMPLANT
COVER SURGICAL LIGHT HANDLE (MISCELLANEOUS) ×2 IMPLANT
DECANTER SPIKE VIAL GLASS SM (MISCELLANEOUS) ×2 IMPLANT
DERMABOND ADVANCED (GAUZE/BANDAGES/DRESSINGS) ×1
DERMABOND ADVANCED .7 DNX12 (GAUZE/BANDAGES/DRESSINGS) IMPLANT
ELECT REM PT RETURN 9FT ADLT (ELECTROSURGICAL) ×2
ELECTRODE REM PT RTRN 9FT ADLT (ELECTROSURGICAL) ×1 IMPLANT
GEL ULTRASOUND 20GR AQUASONIC (MISCELLANEOUS) IMPLANT
GLOVE BIOGEL PI IND STRL 6.5 (GLOVE) IMPLANT
GLOVE BIOGEL PI IND STRL 7.0 (GLOVE) IMPLANT
GLOVE BIOGEL PI IND STRL 7.5 (GLOVE) IMPLANT
GLOVE BIOGEL PI INDICATOR 6.5 (GLOVE) ×1
GLOVE BIOGEL PI INDICATOR 7.0 (GLOVE) ×1
GLOVE BIOGEL PI INDICATOR 7.5 (GLOVE) ×1
GLOVE SS BIOGEL STRL SZ 7 (GLOVE) IMPLANT
GLOVE SS BIOGEL STRL SZ 7.5 (GLOVE) ×1 IMPLANT
GLOVE SUPERSENSE BIOGEL SZ 7 (GLOVE) ×2
GLOVE SUPERSENSE BIOGEL SZ 7.5 (GLOVE)
GOWN STRL NON-REIN LRG LVL3 (GOWN DISPOSABLE) ×6 IMPLANT
GOWN STRL REIN XL XLG (GOWN DISPOSABLE) ×1 IMPLANT
GRAFT GORETEX STND 6X20 (Vascular Products) ×2 IMPLANT
GRAFT GORETEXSTD 6X20 (Vascular Products) IMPLANT
KIT BASIN OR (CUSTOM PROCEDURE TRAY) ×2 IMPLANT
KIT ROOM TURNOVER OR (KITS) ×2 IMPLANT
NDL HYPO 25GX1X1/2 BEV (NEEDLE) ×1 IMPLANT
NEEDLE HYPO 25GX1X1/2 BEV (NEEDLE) ×2 IMPLANT
NS IRRIG 1000ML POUR BTL (IV SOLUTION) ×2 IMPLANT
PACK CV ACCESS (CUSTOM PROCEDURE TRAY) ×2 IMPLANT
PAD ARMBOARD 7.5X6 YLW CONV (MISCELLANEOUS) ×4 IMPLANT
SPONGE GAUZE 4X4 12PLY (GAUZE/BANDAGES/DRESSINGS) ×2 IMPLANT
STRIP CLOSURE SKIN 1/2X4 (GAUZE/BANDAGES/DRESSINGS) ×1 IMPLANT
SUT PROLENE 6 0 BV (SUTURE) ×1 IMPLANT
SUT PROLENE 6 0 CC (SUTURE) ×2 IMPLANT
SUT SILK 2 0 FS (SUTURE) IMPLANT
SUT VIC AB 3-0 SH 27 (SUTURE) ×6
SUT VIC AB 3-0 SH 27X BRD (SUTURE) ×2 IMPLANT
TOWEL OR 17X24 6PK STRL BLUE (TOWEL DISPOSABLE) ×2 IMPLANT
TOWEL OR 17X26 10 PK STRL BLUE (TOWEL DISPOSABLE) ×2 IMPLANT
UNDERPAD 30X30 INCONTINENT (UNDERPADS AND DIAPERS) ×2 IMPLANT
WATER STERILE IRR 1000ML POUR (IV SOLUTION) ×2 IMPLANT

## 2013-05-15 NOTE — Transfer of Care (Signed)
Immediate Anesthesia Transfer of Care Note  Patient: [name]  Procedure(s) Performed: Procedure(s): INSERTION OF ARTERIOVENOUS (AV) GORE-TEX GRAFT ARM-LEFT UPPER ARM (Left)  Patient Location: PACU  Anesthesia Type:General  Level of Consciousness: awake, alert , oriented and patient cooperative  Airway & Oxygen Therapy: Patient Spontanous Breathing and Patient connected to nasal cannula oxygen  Post-op Assessment: Report given to PACU RN, Post -op Vital signs reviewed and stable and Patient moving all extremities  Post vital signs: Reviewed and stable  Complications: No apparent anesthesia complications

## 2013-05-15 NOTE — Anesthesia Postprocedure Evaluation (Signed)
Anesthesia Post Note  Patient: [name]  Procedure(s) Performed: Procedure(s) (LRB): INSERTION OF ARTERIOVENOUS (AV) GORE-TEX GRAFT ARM-LEFT UPPER ARM (Left)  Anesthesia type: general  Patient location: PACU  Post pain: Pain level controlled  Post assessment: Patient's Cardiovascular Status Stable  Last Vitals:  Filed Vitals:   05/15/13 1422  BP: 125/59  Pulse: 65  Temp:   Resp: 15    Post vital signs: Reviewed and stable  Level of consciousness: sedated  Complications: No apparent anesthesia complications

## 2013-05-15 NOTE — Preoperative (Signed)
Beta Blockers   Reason not to administer Beta Blockers:Not Applicable 

## 2013-05-15 NOTE — Interval H&P Note (Signed)
History and Physical Interval Note:  05/15/2013 11:03 AM  [name]  has presented today for surgery, with the diagnosis of End Stage Renal Disease  The various methods of treatment have been discussed with the patient and family. After consideration of risks, benefits and other options for treatment, the patient has consented to  Procedure(s): INSERTION OF ARTERIOVENOUS (AV) GORE-TEX GRAFT ARM-LEFT UPPER ARM (Left) as a surgical intervention .  The patient's history has been reviewed, patient examined, no change in status, stable for surgery.  I have reviewed the patient's chart and labs.  Questions were answered to the patient's satisfaction.     Josephina Gip

## 2013-05-15 NOTE — H&P (Signed)
Progress Notes   AVLEEN BORDWELL (MR# [id number])       Progress Notes Info    Author Note Status Last Update User Last Update Date/Time    Larina Earthly, MD Signed Larina Earthly, MD 03/03/2013  1:45 PM      Progress Notes    [facility]     Patient name: [name]       MRN: [id number]        DOB: July 08, 1932          Sex: female     Referred by: Lowell Guitar   Reason for referral:   Chief Complaint   Patient presents with   .  New Evaluation       Evaluation for new access  referred by Dr Lowell Guitar        HISTORY OF PRESENT ILLNESS: The patient presents today for discussion of AV access for hemodialysis. She has a long history of moderate to severe renal insufficiency. She was a former patient of Dr. Leretha Dykes. She had placement of a left forearm loop graft by Dr. Hart Rochester in 2011. She had Roseanna Koplin reocclusion was taken back to the operating room by myself for revision. She had subsequent reocclusion. She has remained stable with no need for hemodialysis in the ensuing 3+ years. She has had some progression is seen today for further discussion. Interestingly she does report persistent discomfort at the incision site since her initial surgery in 2011. She is asking whether removal of her prosthetic graft to improve this I explained that it would not.    Past Medical History   Diagnosis  Date   .  Renal failure     .  Hypertension     .  Diabetes mellitus     .  Arthritis           Past Surgical History   Procedure  Laterality  Date   .  Lobectomy       .  Cholecystectomy       .  Appendectomy       .  Tubal ligation       .  Fistula left arm  Left  2011       never used         History       Social History   .  Marital Status:  Widowed       Spouse Name:  N/A       Number of Children:  N/A   .  Years of Education:  N/A       Occupational History   .  Not on file.       Social History Main Topics   .  Smoking status:   Never Smoker    .  Smokeless tobacco:  Never Used   .  Alcohol Use:  No   .  Drug Use:  No   .  Sexual Activity:  Not on file       Other Topics  Concern   .  Not on file       Social History Narrative   .  No narrative on file        History reviewed. No pertinent family history.    Allergies as of 03/03/2013 - Review Complete 03/03/2013   Allergen  Reaction  Noted   .  Penicillins cross reactors  Other (See Comments)  04/16/2011  Current Outpatient Prescriptions on File Prior to Visit   Medication  Sig  Dispense  Refill   .  albuterol (PROVENTIL HFA;VENTOLIN HFA) 108 (90 BASE) MCG/ACT inhaler  Inhale 2 puffs into the lungs every 4 (four) hours as needed. For shortness of breath.         .  allopurinol (ZYLOPRIM) 100 MG tablet  Take 100 mg by mouth every morning.          Marland Kitchen  amLODipine (NORVASC) 5 MG tablet  Take 5 mg by mouth every morning.          Marland Kitchen  b complex-vitamin c-folic acid (NEPHRO-VITE) 0.8 MG TABS  Take 0.8 mg by mouth daily.         .  calcitRIOL (ROCALTROL) 0.5 MCG capsule  Take 0.5 mcg by mouth daily.         .  diclofenac sodium (VOLTAREN) 1 % GEL  Apply 2 g topically daily as needed (for arthritis pain). On legs and shoulder         .  ferrous sulfate 325 (65 FE) MG EC tablet  Take 325 mg by mouth daily with breakfast.          .  furosemide (LASIX) 80 MG tablet  Take 80 mg by mouth 2 (two) times daily.         Marland Kitchen  gabapentin (NEURONTIN) 100 MG capsule  Take 100 mg by mouth 2 (two) times daily.         Marland Kitchen  glimepiride (AMARYL) 1 MG tablet  Take 1 mg by mouth daily before breakfast.          .  HYDROcodone-acetaminophen (NORCO) 7.5-325 MG per tablet  Take 1 tablet by mouth daily as needed for pain.         .  pantoprazole (PROTONIX) 40 MG tablet  Take 40 mg by mouth 2 (two) times daily.          .  saxagliptin HCl (ONGLYZA) 2.5 MG TABS tablet  Take 2.5 mg by mouth daily.             No current facility-administered medications on file prior to visit.           REVIEW OF SYSTEMS:   Positives indicated with an "X"   CARDIOVASCULAR:  [ ]  chest pain   [ ]  chest pressure   [ ]  palpitations   [ ]  orthopnea               [ ]  dyspnea on exertion   [ ]  claudication   [ ]  rest pain   [x ] DVT   [ ]  phlebitis PULMONARY:   [ ]  productive cough   [x ] asthma   [ ]  wheezing NEUROLOGIC:   [ ]  weakness  [ ]  paresthesias  [ ]  aphasia  [ ]  amaurosis  [ ]  dizziness HEMATOLOGIC:   [ ]  bleeding problems   [ ]  clotting disorders MUSCULOSKELETAL:  [ ]  joint pain   [ ]  joint swelling GASTROINTESTINAL: [ ]   blood in stool  [ ]   hematemesis GENITOURINARY:  [ ]   dysuria  [ ]   hematuria PSYCHIATRIC:  [ ]  history of major depression INTEGUMENTARY:  [ ]  rashes  [ ]  ulcers CONSTITUTIONAL:  [ ]  fever   [ ]  chills   PHYSICAL EXAMINATION:   General: The patient is a well-nourished female, in no acute distress. Vital signs are BP 137/65  Pulse 83  Resp 18  Ht 5\' 5"  (1.651 m)  Wt 216 lb (97.977 kg)  BMI 35.94 kg/m2 Pulmonary: There is a good air exchange bilaterally    Musculoskeletal: There are no major deformities.  There is no significant extremity pain. Neurologic: No focal weakness or paresthesias are detected, Skin: There are no ulcer or rashes noted. Psychiatric: The patient has normal affect. Cardiovascular: 2+ radial pulses bilaterally She does have a nonfunctional left forearm loop graft.     [facility] Vascular Lab Studies:   Ordered and Independently Reviewed reveals normal arterial flow in her upper extremities with triphasic wave form at the wrist bilaterally. She does have extremely small surface veins bilaterall   Impression and Plan:   Monocryl insufficiency with failed access attempts in 2011. Portion she is not required renal replacement since that time. Discussion with the patient and her daughter present explaining at her next suggested option would be left upper arm AV graft. She is not a candidate for fistula creation due to  small vein size. We will defer timing of this to Dr. Lowell Guitar. I explained we would only need 3-4 weeks of healing prior to being able to use the graft. We will place a left upper arm AV Gore-Tex graft when Dr. Lowell Guitar feels timing is appropriate       Landree Fernholz [facility] Office: [phone]

## 2013-05-15 NOTE — Op Note (Signed)
OPERATIVE REPORT  Date of Surgery: 05/15/2013  Surgeon: Josephina Gip, MD  Assistant: Lianne Cure PA  Pre-op Diagnosis: End Stage Renal Disease  Post-op Diagnosis: End Stage Renal Disease  Procedure: Procedure(s): INSERTION OF ARTERIOVENOUS (AV) Gore-Tex graft left arm-brachial artery to axillary vein-6 mm  Anesthesia: LMA  EBL: Minimal  Complications: None  Procedure Details: The patient was taken to the operating room placed in the supine position at which time satisfactory general-LMA anesthesia was measured. Left upper extremity was prepped Betadine scrub and solution draped in routine sterile manner. A short longitudinal incision was made just proximal to the axilla the axillary vein dissected free. It was a 5-1/2-6 mm vein. Short incision was made in the distal upper arm and the brachial artery was exposed. It was an approximately 3 mm vessel and had a good pulse. Curvilinear tunnel was created on the anterior aspect of the arm and a 6 mm Gore-Tex graft liver through the tunnel. No heparin was given. Artery was occluded proximally and distally with Vesseloops a 15 blade extended with Potts scissors. Brachial artery was explored with a right angle clamp. It had excellent inflow. There was a little on the small side but was adequate. Gore-Tex was anastomosed end to side with 6-0 Prolene. Clamps were released attention turned to the axillary wound where the axillary vein was ligated distally transected. The Gore-Tex was cut appropriately and anastomosed end-to-end with 6-0 Prolene. Clamps then released there was good pulse and thrill in the graft. There was significant diminution of flow in the radial and ulnar artery distally with the graft open she improved significantly with occlusion of the graft. Adequate hemostasis was achieved and the wounds closed in layers with Vicryl in a subcuticular fashion sterile dressing applied patient to recovery in stable condition   Josephina Gip,  MD 05/15/2013 12:56 PM

## 2013-05-15 NOTE — Anesthesia Preprocedure Evaluation (Addendum)
Anesthesia Evaluation  Patient identified by MRN, date of birth, ID band Patient awake    Reviewed: Allergy & Precautions, H&P , NPO status , Patient's Chart, lab work & pertinent test results, reviewed documented beta blocker date and time   History of Anesthesia Complications Negative for: history of anesthetic complications  Airway Mallampati: II TM Distance: >3 FB Neck ROM: Full    Dental  (+) Teeth Intact, Dental Advisory Given, Poor Dentition and Chipped   Pulmonary asthma , COPD breath sounds clear to auscultation        Cardiovascular hypertension, Pt. on medications Rhythm:Regular Rate:Normal     Neuro/Psych    GI/Hepatic GERD-  Medicated and Controlled,  Endo/Other  diabetes  Renal/GU CRFRenal diseaseNo HD yet     Musculoskeletal   Abdominal   Peds  Hematology   Anesthesia Other Findings   Reproductive/Obstetrics                        Anesthesia Physical Anesthesia Plan  ASA: III  Anesthesia Plan: MAC and General   Post-op Pain Management:    Induction: Intravenous  Airway Management Planned: LMA and Simple Face Mask  Additional Equipment:   Intra-op Plan:   Post-operative Plan: Extubation in OR  Informed Consent: I have reviewed the patients History and Physical, chart, labs and discussed the procedure including the risks, benefits and alternatives for the proposed anesthesia with the patient or authorized representative who has indicated his/her understanding and acceptance.     Plan Discussed with: Surgeon and CRNA  Anesthesia Plan Comments:        Anesthesia Quick Evaluation

## 2013-05-15 NOTE — Anesthesia Procedure Notes (Signed)
Procedure Name: LMA Insertion Date/Time: 05/15/2013 11:48 AM Performed by: Darcey Nora B Pre-anesthesia Checklist: Patient identified, Emergency Drugs available, Suction available and Patient being monitored Patient Re-evaluated:Patient Re-evaluated prior to inductionOxygen Delivery Method: Circle system utilized Preoxygenation: Pre-oxygenation with 100% oxygen Intubation Type: IV induction Ventilation: Mask ventilation without difficulty LMA: LMA inserted LMA Size: 4.0 Number of attempts: 1 Placement Confirmation: positive ETCO2 and breath sounds checked- equal and bilateral Tube secured with: taped across cheeks; gauze roll b/t teeth. Dental Injury: Teeth and Oropharynx as per pre-operative assessment

## 2013-05-18 ENCOUNTER — Encounter (HOSPITAL_COMMUNITY)
Admission: RE | Admit: 2013-05-18 | Discharge: 2013-05-18 | Disposition: A | Discharge status: Home / Self Care | Payer: Medicare Other | Source: Ambulatory Visit | Attending: Nephrology | Admitting: Nephrology

## 2013-05-18 DIAGNOSIS — N184 Chronic kidney disease, stage 4 (severe): Secondary | ICD-10-CM | POA: Insufficient documentation

## 2013-05-18 DIAGNOSIS — D638 Anemia in other chronic diseases classified elsewhere: Secondary | ICD-10-CM | POA: Insufficient documentation

## 2013-05-18 LAB — FERRITIN: Ferritin: 1467 ng/mL — ABNORMAL HIGH (ref 10–291)

## 2013-05-18 LAB — POCT HEMOGLOBIN-HEMACUE: Hemoglobin: 8.5 g/dL — ABNORMAL LOW (ref 12.0–15.0)

## 2013-05-18 LAB — IRON AND TIBC: UIBC: 188 ug/dL (ref 125–400)

## 2013-05-18 MED ORDER — DARBEPOETIN ALFA-POLYSORBATE 200 MCG/0.4ML IJ SOLN
200.0000 ug | INTRAMUSCULAR | Status: DC
  Administered 2013-05-18: 200 ug via SUBCUTANEOUS

## 2013-05-18 MED ORDER — DARBEPOETIN ALFA-POLYSORBATE 200 MCG/0.4ML IJ SOLN
INTRAMUSCULAR | Status: AC
  Filled 2013-05-18: qty 0.4

## 2013-05-19 ENCOUNTER — Other Ambulatory Visit (HOSPITAL_BASED_OUTPATIENT_CLINIC_OR_DEPARTMENT_OTHER): Payer: Medicare Other

## 2013-05-19 ENCOUNTER — Other Ambulatory Visit: Payer: Self-pay | Admitting: *Deleted

## 2013-05-19 ENCOUNTER — Encounter (HOSPITAL_COMMUNITY): Payer: Self-pay | Admitting: Vascular Surgery

## 2013-05-19 DIAGNOSIS — D472 Monoclonal gammopathy: Secondary | ICD-10-CM

## 2013-05-19 DIAGNOSIS — N189 Chronic kidney disease, unspecified: Secondary | ICD-10-CM

## 2013-05-19 DIAGNOSIS — D631 Anemia in chronic kidney disease: Secondary | ICD-10-CM

## 2013-05-19 LAB — LACTATE DEHYDROGENASE (CC13): LDH: 222 U/L (ref 125–245)

## 2013-05-19 LAB — CBC WITH DIFFERENTIAL/PLATELET
Basophils Absolute: 0 10*3/uL (ref 0.0–0.1)
EOS%: 1.8 % (ref 0.0–7.0)
Eosinophils Absolute: 0.1 10*3/uL (ref 0.0–0.5)
HCT: 24.6 % — ABNORMAL LOW (ref 34.8–46.6)
HGB: 8.2 g/dL — ABNORMAL LOW (ref 11.6–15.9)
LYMPH%: 11.9 % — ABNORMAL LOW (ref 14.0–49.7)
MCV: 91.7 fL (ref 79.5–101.0)
MONO%: 9.1 % (ref 0.0–14.0)
NEUT#: 4.5 10*3/uL (ref 1.5–6.5)
NEUT%: 76.7 % (ref 38.4–76.8)
Platelets: 229 10*3/uL (ref 145–400)

## 2013-05-19 LAB — COMPREHENSIVE METABOLIC PANEL (CC13)
AST: 20 U/L (ref 5–34)
Albumin: 3.4 g/dL — ABNORMAL LOW (ref 3.5–5.0)
Alkaline Phosphatase: 69 U/L (ref 40–150)
Anion Gap: 13 mEq/L — ABNORMAL HIGH (ref 3–11)
BUN: 89.6 mg/dL — ABNORMAL HIGH (ref 7.0–26.0)
CO2: 24 mEq/L (ref 22–29)
Creatinine: 4.7 mg/dL (ref 0.6–1.1)
Glucose: 159 mg/dl — ABNORMAL HIGH (ref 70–140)
Potassium: 4.4 mEq/L (ref 3.5–5.1)
Total Bilirubin: 0.3 mg/dL (ref 0.20–1.20)

## 2013-05-20 LAB — BETA 2 MICROGLOBULIN, SERUM: Beta-2 Microglobulin: 13.29 mg/L — ABNORMAL HIGH (ref 1.01–1.73)

## 2013-05-20 LAB — KAPPA/LAMBDA LIGHT CHAINS: Kappa free light chain: 7.55 mg/dL — ABNORMAL HIGH (ref 0.33–1.94)

## 2013-05-21 ENCOUNTER — Telehealth: Payer: Self-pay | Admitting: Internal Medicine

## 2013-05-21 ENCOUNTER — Ambulatory Visit (HOSPITAL_BASED_OUTPATIENT_CLINIC_OR_DEPARTMENT_OTHER): Payer: Medicare Other | Admitting: Internal Medicine

## 2013-05-21 VITALS — BP 138/68 | HR 74 | Temp 97.9°F | Resp 18 | Ht 65.0 in | Wt 222.5 lb

## 2013-05-21 DIAGNOSIS — D638 Anemia in other chronic diseases classified elsewhere: Secondary | ICD-10-CM

## 2013-05-21 DIAGNOSIS — D472 Monoclonal gammopathy: Secondary | ICD-10-CM

## 2013-05-21 NOTE — Telephone Encounter (Signed)
Gave pt appt for Md visit on January 2015

## 2013-05-21 NOTE — Progress Notes (Signed)
[facility] [phone]   [phone]  OFFICE PROGRESS NOTE  Eino Farber, MD [address] [place] [address]  DIAGNOSIS:  #1 monoclonal gammopathy of undetermined significance (MGUS).  #2 anemia of chronic disease secondary to chronic kidney disease.   CURRENT THERAPY: Observation.  INTERVAL HISTORY: [name] 79 y.o. female returns to the clinic today for followup visit accompanied by her daughter. The patient is feeling fine today with no specific complaints except for increasing fatigue. She had the new graft placed in the left arm for preparation of hemodialysis because of worsening renal function. She denied having any significant chest pain, shortness of breath, cough or hemoptysis. The patient had repeat myeloma panel performed recently and she is here for evaluation and discussion of her lab results.  MEDICAL HISTORY: Past Medical History  Diagnosis Date  . Renal failure   . Hypertension   . Diabetes mellitus   . Arthritis   . Asthma   . GERD (gastroesophageal reflux disease)   . Neuropathy in diabetes     Hx: of  . COPD (chronic obstructive pulmonary disease)   . Anemia     ALLERGIES:  is allergic to penicillins cross reactors.  MEDICATIONS:  Current Outpatient Prescriptions  Medication Sig Dispense Refill  . allopurinol (ZYLOPRIM) 100 MG tablet Take 100 mg by mouth daily.      Marland Kitchen amLODipine (NORVASC) 5 MG tablet Take 5 mg by mouth daily.      Marland Kitchen b complex-vitamin c-folic acid (NEPHRO-VITE) 0.8 MG TABS tablet Take 1 tablet by mouth daily.      . calcitRIOL (ROCALTROL) 0.5 MCG capsule Take 0.5 mcg by mouth daily.      . ferrous sulfate 325 (65 FE) MG tablet Take 325 mg by mouth daily.      . furosemide (LASIX) 80 MG tablet Take 80 mg by mouth daily.      Marland Kitchen gabapentin (NEURONTIN) 100 MG capsule Take 100 mg by mouth 3 (three) times daily.      Marland Kitchen glimepiride (AMARYL) 1 MG tablet Take 1 mg by mouth  daily.      Marland Kitchen HYDROcodone-acetaminophen (NORCO) 7.5-325 MG per tablet       . oxyCODONE-acetaminophen (PERCOCET/ROXICET) 5-325 MG per tablet Take 1 tablet by mouth every 6 (six) hours as needed.  30 tablet  0  . pantoprazole (PROTONIX) 40 MG tablet Take 40 mg by mouth daily.      . saxagliptin HCl (ONGLYZA) 2.5 MG TABS tablet Take 2.5 mg by mouth daily.       No current facility-administered medications for this visit.    SURGICAL HISTORY:  Past Surgical History  Procedure Laterality Date  . Lobectomy    . Cholecystectomy    . Appendectomy    . Tubal ligation    . Fistula left arm Left 2011    never used  . Colonoscopy      Hx: of  . Tummy tuck      Hx: of  . Abdominal hysterectomy    . Av fistula placement Left 05/15/2013    Procedure: INSERTION OF ARTERIOVENOUS (AV) GORE-TEX GRAFT ARM-LEFT UPPER ARM;  Surgeon: Pryor Ochoa, MD;  Location: [facility] OR;  Service: Vascular;  Laterality: Left;    REVIEW OF SYSTEMS:  A comprehensive review of systems was negative except for: Constitutional: positive for fatigue   PHYSICAL EXAMINATION: General appearance: alert, cooperative and no distress Head: Normocephalic, without obvious abnormality, atraumatic  Neck: no adenopathy Lymph nodes: Cervical, supraclavicular, and axillary nodes normal. Resp: clear to auscultation bilaterally Cardio: regular rate and rhythm, S1, S2 normal, no murmur, click, rub or gallop GI: soft, non-tender; bowel sounds normal; no masses,  no organomegaly Extremities: extremities normal, atraumatic, no cyanosis or edema  ECOG PERFORMANCE STATUS: 1 - Symptomatic but completely ambulatory  Blood pressure 138/68, pulse 74, temperature 97.9 F (36.6 C), temperature source Oral, resp. rate 18, height 5\' 5"  (1.651 m), weight 222 lb 8 oz (100.925 kg).  LABORATORY DATA: Lab Results  Component Value Date   WBC 5.9 05/19/2013   HGB 8.2* 05/19/2013   HCT 24.6* 05/19/2013   MCV 91.7 05/19/2013   PLT 229 05/19/2013       Chemistry      Component Value Date/Time   NA 142 05/19/2013 0928   NA 144 05/15/2013 0833   K 4.4 05/19/2013 0928   K 4.0 05/15/2013 0833   CL 94* 03/23/2013 0909   CL 98 09/17/2012 1242   CO2 24 05/19/2013 0928   CO2 28 03/23/2013 0909   BUN 89.6* 05/19/2013 0928   BUN 87* 03/23/2013 0909   CREATININE 4.7* 05/19/2013 0928   CREATININE 4.84* 03/23/2013 0909      Component Value Date/Time   CALCIUM 9.1 05/19/2013 0928   CALCIUM 9.6 03/23/2013 0909   CALCIUM 8.6 12/02/2012 0933   ALKPHOS 69 05/19/2013 0928   ALKPHOS 81 03/26/2012 1643   AST 20 05/19/2013 0928   AST 23 03/26/2012 1643   ALT 16 05/19/2013 0928   ALT 14 03/26/2012 1643   BILITOT 0.30 05/19/2013 0928   BILITOT 0.2* 03/26/2012 1643     Other lab results: beta-2 microglobulin 13.29, free kappa light chain 7.55, free lambda light chain 115.00 with a kappa/lambda ratio of 0.07. IgG 1770, IgA 94 and IgM 55.   RADIOGRAPHIC STUDIES:   ASSESSMENT: This is a very pleasant 79 years old [place] American female with history of monoclonal gammopathy of undetermined significance as well as anemia of chronic disease. Her last bone marrow biopsy and aspirate in May of 2014 showed only 5% plasma cells, but the patient continues to have worsening of her renal function in addition to increase and the free lambda light chain.  PLAN: I discussed the lab results with the patient and her daughter today. I recommended for her to consider repeat bone marrow biopsy and aspirate to rule out any further progression of her disease. I would see the patient back for follow up visit in one month's for reevaluation and discussion of her biopsy results.  All questions were answered. The patient knows to call the clinic with any problems, questions or concerns. We can certainly see the patient much sooner if necessary.

## 2013-05-22 ENCOUNTER — Encounter (HOSPITAL_COMMUNITY): Payer: Self-pay | Admitting: Emergency Medicine

## 2013-05-22 ENCOUNTER — Emergency Department (HOSPITAL_COMMUNITY)
Admission: EM | Admit: 2013-05-22 | Discharge: 2013-05-22 | Disposition: A | Discharge status: Home / Self Care | Payer: Medicare Other | Attending: Emergency Medicine | Admitting: Emergency Medicine

## 2013-05-22 ENCOUNTER — Telehealth: Payer: Self-pay | Admitting: *Deleted

## 2013-05-22 DIAGNOSIS — M79622 Pain in left upper arm: Secondary | ICD-10-CM

## 2013-05-22 DIAGNOSIS — I129 Hypertensive chronic kidney disease with stage 1 through stage 4 chronic kidney disease, or unspecified chronic kidney disease: Secondary | ICD-10-CM | POA: Insufficient documentation

## 2013-05-22 DIAGNOSIS — Z79899 Other long term (current) drug therapy: Secondary | ICD-10-CM | POA: Insufficient documentation

## 2013-05-22 DIAGNOSIS — Z880 Allergy status to penicillin: Secondary | ICD-10-CM | POA: Insufficient documentation

## 2013-05-22 DIAGNOSIS — J4489 Other specified chronic obstructive pulmonary disease: Secondary | ICD-10-CM | POA: Insufficient documentation

## 2013-05-22 DIAGNOSIS — E1142 Type 2 diabetes mellitus with diabetic polyneuropathy: Secondary | ICD-10-CM | POA: Insufficient documentation

## 2013-05-22 DIAGNOSIS — E119 Type 2 diabetes mellitus without complications: Secondary | ICD-10-CM | POA: Insufficient documentation

## 2013-05-22 DIAGNOSIS — D649 Anemia, unspecified: Secondary | ICD-10-CM | POA: Insufficient documentation

## 2013-05-22 DIAGNOSIS — Z9889 Other specified postprocedural states: Secondary | ICD-10-CM | POA: Insufficient documentation

## 2013-05-22 DIAGNOSIS — N189 Chronic kidney disease, unspecified: Secondary | ICD-10-CM | POA: Insufficient documentation

## 2013-05-22 DIAGNOSIS — J45909 Unspecified asthma, uncomplicated: Secondary | ICD-10-CM | POA: Insufficient documentation

## 2013-05-22 DIAGNOSIS — M79609 Pain in unspecified limb: Secondary | ICD-10-CM | POA: Insufficient documentation

## 2013-05-22 DIAGNOSIS — K219 Gastro-esophageal reflux disease without esophagitis: Secondary | ICD-10-CM | POA: Insufficient documentation

## 2013-05-22 DIAGNOSIS — M7989 Other specified soft tissue disorders: Secondary | ICD-10-CM | POA: Insufficient documentation

## 2013-05-22 DIAGNOSIS — M129 Arthropathy, unspecified: Secondary | ICD-10-CM | POA: Insufficient documentation

## 2013-05-22 DIAGNOSIS — J449 Chronic obstructive pulmonary disease, unspecified: Secondary | ICD-10-CM | POA: Insufficient documentation

## 2013-05-22 DIAGNOSIS — R209 Unspecified disturbances of skin sensation: Secondary | ICD-10-CM | POA: Insufficient documentation

## 2013-05-22 MED ORDER — HYDROCODONE-ACETAMINOPHEN 7.5-325 MG PO TABS: 1.0000 | ORAL_TABLET | Freq: Four times a day (QID) | ORAL | Status: DC | PRN

## 2013-05-22 MED ORDER — CIPROFLOXACIN HCL 250 MG PO TABS: 250.0000 mg | ORAL_TABLET | Freq: Every day | ORAL | Status: DC

## 2013-05-22 NOTE — ED Provider Notes (Signed)
CSN: [id number]     Arrival date & time 05/22/13  1627 History   First MD Initiated Contact with Patient 05/22/13 1828     Chief Complaint  Patient presents with  . Arm Pain   (Consider location/radiation/quality/duration/timing/severity/associated sxs/prior Treatment) HPI Ms. [name] is a 79 y.o. female w/ PMHx of ESRD, HTN, DM type II, COPD, and chronic anemia, presents to the ED w/ complaints of LUE pain. The patient recently had a LUE AVG placed on 05/15/13, from brachial artery to axillary vein. In the last few days, the patient claims she has had increasing pain and warmth in the area, with hardening over the site as well. She also claims that the axillary wound has a foul odor. The patient also says she is having some mild tingling in her fingers as well as coldness in her left hand. She otherwise denies any recent fever or chills, no nausea or vomiting.   Past Medical History  Diagnosis Date  . Renal failure   . Hypertension   . Diabetes mellitus   . Arthritis   . Asthma   . GERD (gastroesophageal reflux disease)   . Neuropathy in diabetes     Hx: of  . COPD (chronic obstructive pulmonary disease)   . Anemia    Past Surgical History  Procedure Laterality Date  . Lobectomy    . Cholecystectomy    . Appendectomy    . Tubal ligation    . Fistula left arm Left 2011    never used  . Colonoscopy      Hx: of  . Tummy tuck      Hx: of  . Abdominal hysterectomy    . Av fistula placement Left 05/15/2013    Procedure: INSERTION OF ARTERIOVENOUS (AV) GORE-TEX GRAFT ARM-LEFT UPPER ARM;  Surgeon: Pryor Ochoa, MD;  Location: [facility] OR;  Service: Vascular;  Laterality: Left;   Family History  Problem Relation Age of Onset  . Blindness Father   . Diabetes Father   . Diabetes Sister   . Kidney failure Brother    History  Substance Use Topics  . Smoking status: Never Smoker   . Smokeless tobacco: Never Used  . Alcohol Use: No   OB History   Grav Para Term Preterm  Abortions TAB SAB Ect Mult Living                 Review of Systems General: Denies fever, chills, diaphoresis, appetite change and fatigue.  Respiratory: Denies SOB, DOE, cough, chest tightness, and wheezing.   Cardiovascular: Denies chest pain, palpitations and leg swelling.  Gastrointestinal: Denies nausea, vomiting, abdominal pain, diarrhea, constipation, blood in stool and abdominal distention.  Genitourinary: Denies dysuria, urgency, frequency, hematuria, flank pain and difficulty urinating.  Endocrine: Denies hot or cold intolerance, sweats, polyuria, polydipsia. Musculoskeletal: Positive for LUE pain. Denies myalgias, back pain, joint swelling, arthralgias and gait problem.  Skin: Denies pallor, rash and wounds.  Neurological: Denies dizziness, seizures, syncope, weakness, lightheadedness, numbness and headaches.  Psychiatric/Behavioral: Denies mood changes, confusion, nervousness, sleep disturbance and agitation.  Allergies  Penicillins cross reactors  Home Medications   Current Outpatient Rx  Name  Route  Sig  Dispense  Refill  . allopurinol (ZYLOPRIM) 100 MG tablet   Oral   Take 100 mg by mouth daily.         Marland Kitchen amLODipine (NORVASC) 5 MG tablet   Oral   Take 5 mg by mouth daily.         Marland Kitchen  b complex-vitamin c-folic acid (NEPHRO-VITE) 0.8 MG TABS tablet   Oral   Take 1 tablet by mouth daily.         . calcitRIOL (ROCALTROL) 0.5 MCG capsule   Oral   Take 0.5 mcg by mouth daily.         . ferrous sulfate 325 (65 FE) MG tablet   Oral   Take 325 mg by mouth daily.         . furosemide (LASIX) 80 MG tablet   Oral   Take 80 mg by mouth daily.         Marland Kitchen gabapentin (NEURONTIN) 100 MG capsule   Oral   Take 100 mg by mouth 3 (three) times daily.         Marland Kitchen glimepiride (AMARYL) 1 MG tablet   Oral   Take 1 mg by mouth daily.         Marland Kitchen HYDROcodone-acetaminophen (NORCO) 7.5-325 MG per tablet               . oxyCODONE-acetaminophen  (PERCOCET/ROXICET) 5-325 MG per tablet   Oral   Take 1 tablet by mouth every 6 (six) hours as needed.   30 tablet   0   . pantoprazole (PROTONIX) 40 MG tablet   Oral   Take 40 mg by mouth daily.         . saxagliptin HCl (ONGLYZA) 2.5 MG TABS tablet   Oral   Take 2.5 mg by mouth daily.         . ciprofloxacin (CIPRO) 250 MG tablet   Oral   Take 1 tablet (250 mg total) by mouth daily with breakfast.   7 tablet   0   . HYDROcodone-acetaminophen (NORCO) 7.5-325 MG per tablet   Oral   Take 1 tablet by mouth every 6 (six) hours as needed for moderate pain.   6 tablet   0    Physical Exam Filed Vitals:   05/22/13 1633 05/22/13 1829 05/22/13 1830 05/22/13 1900  BP: 129/56 136/43 131/48 130/44  Pulse: 73 63 64 63  Temp: 97.4 F (36.3 C)     TempSrc: Oral     Resp: 16 16    SpO2: 100% 99% 98% 97%  General: Vital signs reviewed.  Patient is a well-developed and well-nourished, in no acute distress and cooperative with exam. Alert and oriented x3.  Head: Normocephalic and atraumatic. Eyes: PERRL, EOMI, conjunctivae normal, No scleral icterus.  Neck: Supple, trachea midline, normal ROM, No JVD, masses, thyromegaly, or carotid bruit present.  Cardiovascular: RRR, S1 normal, S2 normal, no murmurs, gallops, or rubs. Pulmonary/Chest: Normal respiratory effort, CTAB, no wheezes, rales, or rhonchi. Abdominal: Soft, non-tender, non-distended, bowel sounds are normal, no masses, organomegaly, or guarding present.  Musculoskeletal: No joint deformities, erythema, or stiffness, ROM full and no nontender. Extremities: Positive for RUE pain over AVG site, w/ increased warmth, erythema, and hardness. Graft has good thrill and bruit. Mild swelling over the area. Pulses symmetric and intact bilaterally. No cyanosis or clubbing. Neurological: A&O x3, Strength is normal and symmetric bilaterally, cranial nerve II-XII are grossly intact, no focal motor deficit, sensory intact to light touch  bilaterally.  Skin: Warm, dry and intact. No rashes or erythema. Psychiatric: Normal mood and affect. speech and behavior is normal. Cognition and memory are normal.   ED Course  Procedures (including critical care time) Labs Review Labs Reviewed - No data to display Imaging Review No results found.  EKG Interpretation   None  MDM   Ms. CYNTHIS PURINGTON is a 79 y.o. female w/ PMHx of ESRD, HTN, DM type II, COPD, and chronic anemia, presents to the ED w/ complaints of LUE pain at her recent AVG site. Appears slightly erythematous, tender to the touch, slightly foul odor from axillary incision site. Graft with good thrill and bruit, good distal pulses in LUE. No fever, chills, nausea or vomiting. Vitals stable.  -Discussed w/ Dr. Arbie Cookey, feels that this is likely a reaction to the graft material and is unlikely to be an infection. However, he did recommend starting Cipro 250 mg qd for 7 days (adjusted for ESRD; also w/ PCN and similar allergy). Will see in their office on Monday.      Courtney Paris, MD 05/22/13 [phone]

## 2013-05-22 NOTE — ED Notes (Signed)
Pt reports that she had a dialysis graft placed to the left arm about a week ago. Reports that she she has been having pain since it was placed. Reports the pain is a tingling sensation, but has gotten better since it started. Reports the area feels hard. Pulse palpated in the wrist at triage.

## 2013-05-22 NOTE — Telephone Encounter (Signed)
Patient is s/p Left UA AVG on 05-15-13 by Dr. Hart Rochester. She called in and reports that her left hand is cool and that all of her fingers on that hand are numb;  This numbness is increasing. [name] states that she is unable to lift any object or grasp items with this hand and her left arm is swollen. She thinks she is having drainage with an odor coming from her incision but she has been afebrile. I discussed this with Dr. Imogene Burn and he suggests that the patient report to the ED for evaluation for steal syndrome and possible intervention. Patient was agreeable to this plan and will go to the ED for evaluation asap.

## 2013-05-22 NOTE — ED Provider Notes (Signed)
I saw and evaluated the patient, reviewed the resident's note and I agree with the findings and plan.  EKG Interpretation   None       PT recently had LUE AV Graft for future dialysis use. Complaining of severe pain, swelling and foul odor. No drainage, mild warmth. Abx per Dr. Arbie Cookey.   Braleigh Massoud B. Bernette Mayers, MD 05/22/13 [phone]

## 2013-05-23 ENCOUNTER — Encounter: Payer: Self-pay | Admitting: Internal Medicine

## 2013-05-23 NOTE — Patient Instructions (Signed)
Follow up visit in one month after repeating bone marrow biopsy and aspirate

## 2013-05-25 ENCOUNTER — Telehealth: Payer: Self-pay | Admitting: Vascular Surgery

## 2013-05-25 ENCOUNTER — Encounter: Payer: Self-pay | Admitting: Vascular Surgery

## 2013-05-25 NOTE — Telephone Encounter (Addendum)
Message copied by Fredrich Birks on Mon May 25, 2013 11:32 AM ------      Message from: Melene Plan      Created: Mon May 25, 2013 11:09 AM       Dr Ilean Skill wants her added to his schedule tomorrow for problem with AVG.      Thanks      Darel Hong ------  05/25/13: spoke with pt to schedule for 145 tomorrow. Pt "will try" to be here. She states that "it feels better." I advised pt to keep appt, and that [facility] has requested to see her tomorrow.

## 2013-05-26 ENCOUNTER — Encounter: Payer: Self-pay | Admitting: Vascular Surgery

## 2013-05-26 ENCOUNTER — Ambulatory Visit (INDEPENDENT_AMBULATORY_CARE_PROVIDER_SITE_OTHER): Payer: Self-pay | Admitting: Vascular Surgery

## 2013-05-26 VITALS — BP 123/52 | HR 62 | Ht 65.0 in | Wt 219.5 lb

## 2013-05-26 DIAGNOSIS — T82898A Other specified complication of vascular prosthetic devices, implants and grafts, initial encounter: Secondary | ICD-10-CM | POA: Insufficient documentation

## 2013-05-26 DIAGNOSIS — N186 End stage renal disease: Secondary | ICD-10-CM

## 2013-05-26 NOTE — Progress Notes (Signed)
The patient presents today for followup of left upper arm AV Gore-Tex graft placement by Dr. Hart Rochester on 05/15/2013. She had presented to [facility] emergency room on the evening of 1219 concern regarding some soreness in her axillary incision. The emergency room physician felt this was very superficial was placed on Keflex and is here today for followup.  On physical exam she has no evidence of any erythema. Her axillary and antecubital incisions are healed quite nicely. She has an excellent thrill in her upper arm graft. She does have a palpable left radial pulse. Her hand is warm and well perfused.  She does have unusual complaint of tingling in her left hand. This occurs only when she is lying flat and it is not known whether she is on her right or left side. She reports that she sits up that this is resolved.  A long discussion with the patient and her daughter present. This certainly is concerning for steal syndrome. She does have a normal radial pulse and a very warm hand. I explained that her only option would be for ligation of her graft and placement of a right arm graft which we would prefer to avoid. She reports that this is improving. She will continue to monitor this and see [place] if she has progressive changes or worsening symptoms. Otherwise she will see Dr. Hart Rochester in 2 weeks for continued followup

## 2013-06-05 ENCOUNTER — Other Ambulatory Visit: Payer: Self-pay | Admitting: Radiology

## 2013-06-05 ENCOUNTER — Encounter (HOSPITAL_COMMUNITY): Payer: Self-pay | Admitting: Pharmacy Technician

## 2013-06-08 ENCOUNTER — Ambulatory Visit (HOSPITAL_COMMUNITY)
Admission: RE | Admit: 2013-06-08 | Discharge: 2013-06-08 | Disposition: A | Discharge status: Home / Self Care | Payer: Medicare Other | Source: Ambulatory Visit | Attending: Internal Medicine | Admitting: Internal Medicine

## 2013-06-08 ENCOUNTER — Encounter (HOSPITAL_COMMUNITY): Payer: Self-pay

## 2013-06-08 DIAGNOSIS — I10 Essential (primary) hypertension: Secondary | ICD-10-CM | POA: Insufficient documentation

## 2013-06-08 DIAGNOSIS — N189 Chronic kidney disease, unspecified: Secondary | ICD-10-CM | POA: Insufficient documentation

## 2013-06-08 DIAGNOSIS — D472 Monoclonal gammopathy: Secondary | ICD-10-CM | POA: Insufficient documentation

## 2013-06-08 DIAGNOSIS — K219 Gastro-esophageal reflux disease without esophagitis: Secondary | ICD-10-CM | POA: Insufficient documentation

## 2013-06-08 DIAGNOSIS — Z79899 Other long term (current) drug therapy: Secondary | ICD-10-CM | POA: Insufficient documentation

## 2013-06-08 DIAGNOSIS — E1142 Type 2 diabetes mellitus with diabetic polyneuropathy: Secondary | ICD-10-CM | POA: Insufficient documentation

## 2013-06-08 DIAGNOSIS — D72822 Plasmacytosis: Secondary | ICD-10-CM | POA: Insufficient documentation

## 2013-06-08 DIAGNOSIS — D704 Cyclic neutropenia: Secondary | ICD-10-CM | POA: Insufficient documentation

## 2013-06-08 DIAGNOSIS — D649 Anemia, unspecified: Secondary | ICD-10-CM | POA: Insufficient documentation

## 2013-06-08 DIAGNOSIS — I129 Hypertensive chronic kidney disease with stage 1 through stage 4 chronic kidney disease, or unspecified chronic kidney disease: Secondary | ICD-10-CM | POA: Insufficient documentation

## 2013-06-08 DIAGNOSIS — E1149 Type 2 diabetes mellitus with other diabetic neurological complication: Secondary | ICD-10-CM | POA: Insufficient documentation

## 2013-06-08 LAB — CBC
HCT: 27 % — ABNORMAL LOW (ref 36.0–46.0)
Hemoglobin: 9 g/dL — ABNORMAL LOW (ref 12.0–15.0)
MCH: 30.2 pg (ref 26.0–34.0)
MCHC: 33.3 g/dL (ref 30.0–36.0)
MCV: 90.6 fL (ref 78.0–100.0)
Platelets: 162 10*3/uL (ref 150–400)
RBC: 2.98 MIL/uL — AB (ref 3.87–5.11)
RDW: 16.2 % — ABNORMAL HIGH (ref 11.5–15.5)
WBC: 6 10*3/uL (ref 4.0–10.5)

## 2013-06-08 LAB — PROTIME-INR
INR: 1.02 (ref 0.00–1.49)
PROTHROMBIN TIME: 13.2 s (ref 11.6–15.2)

## 2013-06-08 LAB — APTT: APTT: 26 s (ref 24–37)

## 2013-06-08 LAB — BONE MARROW EXAM

## 2013-06-08 LAB — GLUCOSE, CAPILLARY
GLUCOSE-CAPILLARY: 68 mg/dL — AB (ref 70–99)
Glucose-Capillary: 112 mg/dL — ABNORMAL HIGH (ref 70–99)

## 2013-06-08 MED ORDER — FENTANYL CITRATE 0.05 MG/ML IJ SOLN
INTRAMUSCULAR | Status: AC | PRN
  Administered 2013-06-08: 50 ug via INTRAVENOUS
  Administered 2013-06-08: 100 ug via INTRAVENOUS

## 2013-06-08 MED ORDER — MIDAZOLAM HCL 2 MG/2ML IJ SOLN
INTRAMUSCULAR | Status: AC
  Filled 2013-06-08: qty 4

## 2013-06-08 MED ORDER — DEXTROSE 50 % IV SOLN
12.5000 g | Freq: Once | INTRAVENOUS | Status: AC | PRN
  Administered 2013-06-08: 12.5 g via INTRAVENOUS
  Filled 2013-06-08 (×2): qty 50

## 2013-06-08 MED ORDER — FENTANYL CITRATE 0.05 MG/ML IJ SOLN
INTRAMUSCULAR | Status: AC
  Filled 2013-06-08: qty 4

## 2013-06-08 MED ORDER — SODIUM CHLORIDE 0.9 % IV SOLN
Freq: Once | INTRAVENOUS | Status: AC
  Administered 2013-06-08: 20 mL/h via INTRAVENOUS

## 2013-06-08 MED ORDER — MIDAZOLAM HCL 2 MG/2ML IJ SOLN
INTRAMUSCULAR | Status: AC | PRN
  Administered 2013-06-08 (×4): 1 mg via INTRAVENOUS

## 2013-06-08 NOTE — Progress Notes (Signed)
Pt her in Short Stay for prep for CT Bone Marrow Bx. Pt has not taken in of AM Diabetic Medication. CGB is 69 and pt is asymtomatic. Placed call to Leone Payor PA Radiology and orders recieved

## 2013-06-08 NOTE — Procedures (Signed)
BM aspirate and core No comp 

## 2013-06-08 NOTE — H&P (Signed)
Chief Complaint: "I'm here for a bone marrow biopsy" Referring Physician:Mohamed HPI: [name] is an 80 y.o. female with monoclonal gammopathy. She underwent BM bx in 5/14 and here today for repeat study. She did well with the first. PMHx and meds reviewed. No recent illness or fevers.  Past Medical History:  Past Medical History  Diagnosis Date  . Renal failure   . Hypertension   . Diabetes mellitus   . Arthritis   . Asthma   . GERD (gastroesophageal reflux disease)   . Neuropathy in diabetes     Hx: of  . COPD (chronic obstructive pulmonary disease)   . Anemia     Past Surgical History:  Past Surgical History  Procedure Laterality Date  . Lobectomy    . Cholecystectomy    . Appendectomy    . Tubal ligation    . Fistula left arm Left 2011    never used  . Colonoscopy      Hx: of  . Tummy tuck      Hx: of  . Abdominal hysterectomy    . Av fistula placement Left 05/15/2013    Procedure: INSERTION OF ARTERIOVENOUS (AV) GORE-TEX GRAFT ARM-LEFT UPPER ARM;  Surgeon: Mal Misty, MD;  Location: [facility] OR;  Service: Vascular;  Laterality: Left;    Family History:  Family History  Problem Relation Age of Onset  . Blindness Father   . Diabetes Father   . Diabetes Sister   . Kidney failure Brother     Social History:  reports that she has never smoked. She has never used smokeless tobacco. She reports that she does not drink alcohol or use illicit drugs.  Allergies:  Allergies  Allergen Reactions  . Penicillins Cross Reactors Other (See Comments)    Tongue Swelling    Medications:   Medication List    ASK your doctor about these medications       allopurinol 100 MG tablet  Commonly known as:  ZYLOPRIM  Take 100 mg by mouth daily with breakfast.     amLODipine 5 MG tablet  Commonly known as:  NORVASC  Take 5 mg by mouth daily with breakfast.     b complex-vitamin c-folic acid 0.8 MG Tabs tablet  Take 1 tablet by mouth daily.     calcitRIOL 0.5  MCG capsule  Commonly known as:  ROCALTROL  Take 0.5 mcg by mouth daily with breakfast.     ferrous sulfate 325 (65 FE) MG tablet  Take 325 mg by mouth daily.     furosemide 80 MG tablet  Commonly known as:  LASIX  Take 80 mg by mouth daily.     gabapentin 100 MG capsule  Commonly known as:  NEURONTIN  Take 100 mg by mouth 3 (three) times daily.     glimepiride 1 MG tablet  Commonly known as:  AMARYL  Take 1 mg by mouth daily with breakfast.     HYDROcodone-acetaminophen 7.5-325 MG per tablet  Commonly known as:  NORCO  Take 1 tablet by mouth every 6 (six) hours as needed for moderate pain.     oxyCODONE-acetaminophen 5-325 MG per tablet  Commonly known as:  PERCOCET/ROXICET  Take 1 tablet by mouth every 6 (six) hours as needed for moderate pain or severe pain.     pantoprazole 40 MG tablet  Commonly known as:  PROTONIX  Take 40 mg by mouth 2 (two) times daily.     saxagliptin HCl 2.5 MG Tabs tablet  Commonly known as:  ONGLYZA  Take 2.5 mg by mouth daily with breakfast.        Please HPI for pertinent positives, otherwise complete 10 system ROS negative.  Physical Exam: BP 150/46  Pulse 54  Temp(Src) 97.2 F (36.2 C) (Oral)  Resp 16  Ht $R'5\' 5"'SG$  (1.651 m)  Wt 219 lb (99.338 kg)  BMI 36.44 kg/m2  SpO2 100% Body mass index is 36.44 kg/(m^2).   General Appearance:  Alert, cooperative, no distress, appears stated age  Head:  Normocephalic, without obvious abnormality, atraumatic  ENT: Unremarkable  Neck: Supple, symmetrical, trachea midline  Lungs:   Clear to auscultation bilaterally, no w/r/r, respirations unlabored without use of accessory muscles.  Chest Wall:  No tenderness or deformity  Heart:  Regular rate and rhythm, S1, S2 normal, no murmur, rub or gallop.  Neurologic: Normal affect, no gross deficits.   Results for orders placed during the hospital encounter of 06/08/13 (from the past 48 hour(s))  APTT     Status: None   Collection Time    06/08/13   7:00 AM      Result Value Range   aPTT 26  24 - 37 seconds  CBC     Status: Abnormal   Collection Time    06/08/13  7:00 AM      Result Value Range   WBC 6.0  4.0 - 10.5 K/uL   RBC 2.98 (*) 3.87 - 5.11 MIL/uL   Hemoglobin 9.0 (*) 12.0 - 15.0 g/dL   HCT 27.0 (*) 36.0 - 46.0 %   MCV 90.6  78.0 - 100.0 fL   MCH 30.2  26.0 - 34.0 pg   MCHC 33.3  30.0 - 36.0 g/dL   RDW 16.2 (*) 11.5 - 15.5 %   Platelets 162  150 - 400 K/uL  PROTIME-INR     Status: None   Collection Time    06/08/13  7:00 AM      Result Value Range   Prothrombin Time 13.2  11.6 - 15.2 seconds   INR 1.02  0.00 - 1.49   No results found.  Assessment/Plan Monoclonal gammopathy CT guided BM biopsy Procedure, risks, complications, use of sedation discussed with pt and family. Labs reviewed. Consent signed in chart  Ascencion Dike PA-C 06/08/2013, 8:21 AM

## 2013-06-08 NOTE — Discharge Instructions (Signed)
Biopsy Care After Refer to this sheet in the next few weeks. These instructions provide you with information on caring for yourself after your procedure. Your caregiver may also give you more specific instructions. Your treatment has been planned according to current medical practices, but problems sometimes occur. Call your caregiver if you have any problems or questions after your procedure. If you had a fine needle biopsy, you may have soreness at the biopsy site for 1 to 2 days. If you had an open biopsy, you may have soreness at the biopsy site for 3 to 4 days. HOME CARE INSTRUCTIONS   You may resume normal diet and activities as directed.  Change bandages (dressings) as directed. If your wound was closed with a skin glue (adhesive), it will wear off and begin to peel in 7 days.  Only take over-the-counter or prescription medicines for pain, discomfort, or fever as directed by your caregiver.  Ask your caregiver when you can bathe and get your wound wet. SEEK IMMEDIATE MEDICAL CARE IF:   You have increased bleeding (more than a small spot) from the biopsy site.  You notice redness, swelling, or increasing pain at the biopsy site.  You have pus coming from the biopsy site.  You have a fever.  You notice a bad smell coming from the biopsy site or dressing.  You have a rash, have difficulty breathing, or have any allergic problems. MAKE SURE YOU:   Understand these instructions.  Will watch your condition.  Will get help right away if you are not doing well or get worse. Document Released: 12/08/2004 Document Revised: 08/13/2011 Document Reviewed: 11/16/2010 [facility] Patient Information 2014 [place]. Moderate Sedation, Adult Moderate sedation is given to help you relax or even sleep through a procedure. You may remain sleepy, be clumsy, or have poor balance for several hours following this procedure. Arrange for a responsible adult, family member, or friend to take you  home. A responsible adult should stay with you for at least 24 hours or until the medicines have worn off.  Do not participate in any activities where you could become injured for the next 24 hours, or until you feel normal again. Do not:  Drive.  Swim.  Ride a bicycle.  Operate heavy machinery.  Cook.  Use power tools.  Climb ladders.  Work at [employer].  Do not make important decisions or sign legal documents until you are improved.  Vomiting may occur if you eat too soon. When you can drink without vomiting, try water, juice, or soup. Try solid foods if you feel little or no nausea.  Only take over-the-counter or prescription medications for pain, discomfort, or fever as directed by your caregiver.If pain medications have been prescribed for you, ask your caregiver how soon it is safe to take them.  Make sure you and your family fully understands everything about the medication given to you. Make sure you understand what side effects may occur.  You should not drink alcohol, take sleeping pills, or medications that cause drowsiness for at least 24 hours.  If you smoke, do not smoke alone.  If you are feeling better, you may resume normal activities 24 hours after receiving sedation.  Keep all appointments as scheduled. Follow all instructions.  Ask questions if you do not understand. SEEK MEDICAL CARE IF:   Your skin is pale or bluish in color.  You continue to feel sick to your stomach (nauseous) or throw up (vomit).  Your pain is getting worse  and not helped by medication.  You have bleeding or swelling.  You are still sleepy or feeling clumsy after 24 hours. SEEK IMMEDIATE MEDICAL CARE IF:   You develop a rash.  You have difficulty breathing.  You develop any type of allergic problem.  You have a fever. Document Released: 02/13/2001 Document Revised: 08/13/2011 Document Reviewed: 07/07/2007 [facility] Patient Information 2014 [place].

## 2013-06-12 LAB — CHROMOSOME ANALYSIS, BONE MARROW

## 2013-06-15 ENCOUNTER — Encounter (HOSPITAL_COMMUNITY)
Admission: RE | Admit: 2013-06-15 | Discharge: 2013-06-15 | Disposition: A | Discharge status: Home / Self Care | Payer: Medicare Other | Source: Ambulatory Visit | Attending: Nephrology | Admitting: Nephrology

## 2013-06-15 DIAGNOSIS — D638 Anemia in other chronic diseases classified elsewhere: Secondary | ICD-10-CM | POA: Insufficient documentation

## 2013-06-15 DIAGNOSIS — N184 Chronic kidney disease, stage 4 (severe): Secondary | ICD-10-CM | POA: Insufficient documentation

## 2013-06-15 LAB — IRON AND TIBC
Iron: 67 ug/dL (ref 42–135)
Saturation Ratios: 32 % (ref 20–55)
TIBC: 212 ug/dL — ABNORMAL LOW (ref 250–470)
UIBC: 145 ug/dL (ref 125–400)

## 2013-06-15 LAB — RENAL FUNCTION PANEL
ALBUMIN: 3.4 g/dL — AB (ref 3.5–5.2)
BUN: 80 mg/dL — ABNORMAL HIGH (ref 6–23)
CHLORIDE: 105 meq/L (ref 96–112)
CO2: 22 mEq/L (ref 19–32)
Calcium: 8.7 mg/dL (ref 8.4–10.5)
Creatinine, Ser: 4.75 mg/dL — ABNORMAL HIGH (ref 0.50–1.10)
GFR, EST AFRICAN AMERICAN: 9 mL/min — AB (ref >90)
GFR, EST NON AFRICAN AMERICAN: 8 mL/min — AB (ref >90)
Glucose, Bld: 162 mg/dL — ABNORMAL HIGH (ref 70–99)
POTASSIUM: 4.2 meq/L (ref 3.7–5.3)
Phosphorus: 4.7 mg/dL — ABNORMAL HIGH (ref 2.3–4.6)
SODIUM: 143 meq/L (ref 137–147)

## 2013-06-15 LAB — POCT HEMOGLOBIN-HEMACUE: HEMOGLOBIN: 9.8 g/dL — AB (ref 12.0–15.0)

## 2013-06-15 LAB — FERRITIN: Ferritin: 1404 ng/mL — ABNORMAL HIGH (ref 10–291)

## 2013-06-15 MED ORDER — DARBEPOETIN ALFA-POLYSORBATE 200 MCG/0.4ML IJ SOLN
INTRAMUSCULAR | Status: AC
  Filled 2013-06-15: qty 0.4

## 2013-06-15 MED ORDER — DARBEPOETIN ALFA-POLYSORBATE 200 MCG/0.4ML IJ SOLN
200.0000 ug | INTRAMUSCULAR | Status: DC
  Administered 2013-06-15: 200 ug via SUBCUTANEOUS

## 2013-06-16 ENCOUNTER — Ambulatory Visit: Payer: Medicare Other | Admitting: Vascular Surgery

## 2013-06-22 ENCOUNTER — Encounter: Payer: Self-pay | Admitting: Internal Medicine

## 2013-06-22 ENCOUNTER — Telehealth: Payer: Self-pay | Admitting: Internal Medicine

## 2013-06-22 ENCOUNTER — Ambulatory Visit (HOSPITAL_BASED_OUTPATIENT_CLINIC_OR_DEPARTMENT_OTHER): Payer: Medicare Other | Admitting: Internal Medicine

## 2013-06-22 VITALS — BP 148/64 | HR 69 | Temp 97.3°F | Resp 17 | Ht 66.0 in | Wt 224.3 lb

## 2013-06-22 DIAGNOSIS — N189 Chronic kidney disease, unspecified: Secondary | ICD-10-CM

## 2013-06-22 DIAGNOSIS — C9000 Multiple myeloma not having achieved remission: Secondary | ICD-10-CM

## 2013-06-22 DIAGNOSIS — N039 Chronic nephritic syndrome with unspecified morphologic changes: Secondary | ICD-10-CM

## 2013-06-22 DIAGNOSIS — D631 Anemia in chronic kidney disease: Secondary | ICD-10-CM

## 2013-06-22 DIAGNOSIS — E119 Type 2 diabetes mellitus without complications: Secondary | ICD-10-CM

## 2013-06-22 MED ORDER — DEXAMETHASONE 4 MG PO TABS: ORAL_TABLET | ORAL | Status: DC

## 2013-06-22 MED ORDER — ACYCLOVIR 400 MG PO TABS: 400.0000 mg | ORAL_TABLET | Freq: Two times a day (BID) | ORAL | Status: DC

## 2013-06-22 NOTE — Telephone Encounter (Signed)
gv adn printed apt sched and avs for pt for Jan adn Feb...gv referrall to [employer].

## 2013-06-22 NOTE — Progress Notes (Signed)
[facility] [phone]   [phone]  OFFICE PROGRESS NOTE  [name], MD [place] [address]  DIAGNOSIS:  #1 plasma cell dyscrasia, multiple myeloma.  #2 anemia of chronic disease secondary to chronic kidney disease.   CURRENT THERAPY: Observation.  INTERVAL HISTORY: [name] 80 y.o. female returns to the clinic today for followup visit accompanied by her daughter. The patient is feeling fine today with no specific complaints except for increasing fatigue. She denied having any significant chest pain, shortness of breath, cough or hemoptysis. The patient had another bone marrow biopsy and aspirate performed recently and she is here for evaluation and discussion of her biopsy results and recommendation regarding her condition.  MEDICAL HISTORY: Past Medical History  Diagnosis Date  . Renal failure   . Hypertension   . Diabetes mellitus   . Arthritis   . Asthma   . GERD (gastroesophageal reflux disease)   . Neuropathy in diabetes     Hx: of  . COPD (chronic obstructive pulmonary disease)   . Anemia     ALLERGIES:  is allergic to penicillins cross reactors.  MEDICATIONS:  Current Outpatient Prescriptions  Medication Sig Dispense Refill  . allopurinol (ZYLOPRIM) 100 MG tablet Take 100 mg by mouth daily with breakfast.       . amLODipine (NORVASC) 5 MG tablet Take 5 mg by mouth daily with breakfast.       . b complex-vitamin c-folic acid (NEPHRO-VITE) 0.8 MG TABS tablet Take 1 tablet by mouth daily.      . calcitRIOL (ROCALTROL) 0.5 MCG capsule Take 0.5 mcg by mouth daily with breakfast.       . ferrous sulfate 325 (65 FE) MG tablet Take 325 mg by mouth daily.      . furosemide (LASIX) 80 MG tablet Take 80 mg by mouth daily.      Marland Kitchen gabapentin (NEURONTIN) 100 MG capsule Take 100 mg by mouth 3 (three) times daily.      Marland Kitchen glimepiride (AMARYL) 1 MG tablet Take 1 mg by mouth daily with breakfast.        . HYDROcodone-acetaminophen (NORCO) 7.5-325 MG per tablet Take 1 tablet by mouth every 6 (six) hours as needed for moderate pain.      . pantoprazole (PROTONIX) 40 MG tablet Take 40 mg by mouth 2 (two) times daily.       . saxagliptin HCl (ONGLYZA) 2.5 MG TABS tablet Take 2.5 mg by mouth daily with breakfast.       . Oxycodone HCl 20 MG TABS 20 mg.       No current facility-administered medications for this visit.    SURGICAL HISTORY:  Past Surgical History  Procedure Laterality Date  . Lobectomy    . Cholecystectomy    . Appendectomy    . Tubal ligation    . Fistula left arm Left 2011    never used  . Colonoscopy      Hx: of  . Tummy tuck      Hx: of  . Abdominal hysterectomy    . Av fistula placement Left 05/15/2013    Procedure: INSERTION OF ARTERIOVENOUS (AV) GORE-TEX GRAFT ARM-LEFT UPPER ARM;  Surgeon: Mal Misty, MD;  Location: [facility] OR;  Service: Vascular;  Laterality: Left;    REVIEW OF SYSTEMS:  Constitutional: positive for fatigue Eyes: negative Ears, nose, mouth, throat, and face: negative Respiratory: negative Cardiovascular: negative Gastrointestinal: negative Genitourinary:negative Integument/breast:  negative Hematologic/lymphatic: negative Musculoskeletal:negative Neurological: negative Behavioral/Psych: negative Endocrine: negative Allergic/Immunologic: negative   PHYSICAL EXAMINATION: General appearance: alert, cooperative, fatigued and no distress Head: Normocephalic, without obvious abnormality, atraumatic Neck: no adenopathy Lymph nodes: Cervical, supraclavicular, and axillary nodes normal. Resp: clear to auscultation bilaterally Back: symmetric, no curvature. ROM normal. No CVA tenderness. Cardio: regular rate and rhythm, S1, S2 normal, no murmur, click, rub or gallop GI: soft, non-tender; bowel sounds normal; no masses,  no organomegaly Extremities: extremities normal, atraumatic, no cyanosis or edema Neurologic: Alert and oriented X 3,  normal strength and tone. Normal symmetric reflexes. Normal coordination and gait  ECOG PERFORMANCE STATUS: 2 - Symptomatic, <50% confined to bed  Blood pressure 148/64, pulse 69, temperature 97.3 F (36.3 C), temperature source Oral, resp. rate 17, height _0  (1.676 m), weight 224 lb 4.8 oz (101.742 kg).  LABORATORY DATA: Lab Results  Component Value Date   WBC 6.0 06/08/2013   HGB 9.8* 06/15/2013   HCT 27.0* 06/08/2013   MCV 90.6 06/08/2013   PLT 162 06/08/2013      Chemistry      Component Value Date/Time   NA 143 06/15/2013 0951   NA 142 05/19/2013 0928   K 4.2 06/15/2013 0951   K 4.4 05/19/2013 0928   CL 105 06/15/2013 0951   CL 98 09/17/2012 1242   CO2 22 06/15/2013 0951   CO2 24 05/19/2013 0928   BUN 80* 06/15/2013 0951   BUN 89.6* 05/19/2013 0928   CREATININE 4.75* 06/15/2013 0951   CREATININE 4.7* 05/19/2013 0928      Component Value Date/Time   CALCIUM 8.7 06/15/2013 0951   CALCIUM 9.1 05/19/2013 0928   CALCIUM 8.6 12/02/2012 0933   ALKPHOS 69 05/19/2013 0928   ALKPHOS 81 03/26/2012 1643   AST 20 05/19/2013 0928   AST 23 03/26/2012 1643   ALT 16 05/19/2013 0928   ALT 14 03/26/2012 1643   BILITOT 0.30 05/19/2013 0928   BILITOT 0.2* 03/26/2012 1643     Other lab results performed on 05/19/2013: beta-2 microglobulin 13.29, free kappa light chain 7.55, free lambda light chain 115.00 with a kappa/lambda ratio of 0.07. IgG 1770, IgA 94 and IgM 55.  BONE MARROW REPORT FINAL DIAGNOSIS Diagnosis Bone Marrow, Aspirate,Biopsy, and Clot, right iliac - SLIGHTLY HYPERCELLULAR BONE MARROW FOR AGE WITH TRILINEAGE HEMATOPOIESIS. - MILD PLASMACYTOSIS (PLASMA CELLS 8%) - SEE COMMENT. PERIPHERAL BLOOD: - NORMOCYTIC-NORMOCHROMIC ANEMIA. Diagnosis Note The bone marrow is slightly hypercellular for age with trilineage hematopoiesis and nonspecific changes. The plasma cells represent 8% of all cells with lack of large aggregates or sheets. Immunohistochemical stains show that the plasma  cells display lambda light chain excess. The features are most suggestive of plasma cell dyscrasia. Clinical correlation is recommended. (BNS:kh 06-10-13) Susanne Greenhouse MD Pathologist, Electronic Signature (Case signed 06/10/2013)    ASSESSMENT: This is a very pleasant 80 years old [place] American female with history of monoclonal gammopathy of undetermined significance as well as anemia of chronic disease. Her last bone marrow biopsy and aspirate in May of 2014 showed only 5% plasma cells, but the patient continues to have worsening of her renal function in addition to increase in the free lambda light chain. Repeat bone marrow biopsy and aspirate on 06/08/2013 showed increase of the plasma cells to 8% with lambda light chain specificity suspicious for plasma cell dyscrasia.  PLAN: I discussed the lab results with the patient and her daughter today.  I recommended for the patient to have skeletal bone survey  to evaluate for lytic lesions. I also discussed with the patient treatment options for her condition especially with worsening renal function as well as the persistent anemia. I do think the patient would be a good candidate for treatment with oral Revlimid or Pomalyst because of her significant renal dysfunction. Her performance status is also poor and I don't think the patient would be able to tolerate aggressive treatment for multiple myeloma with Velcade, Cytoxan and dexamethasone or Carfilzomib, Cytoxan and dexamethasone. I recommended for her treatment with single agent subcutaneous Velcade 1.3 mg/m2 on a weekly basis with Decadron 40 mg by mouth weekly. I discussed with the patient adverse effect of this treatment including myelosuppression, nausea and vomiting, peripheral neuropathy as well as hyperglycemia. The patient has history of diabetes mellitus and I strongly advise her to consult with her primary care physician for adjustment of her diabetic medications especially after starting  steroid treatment. I will arrange for the patient to have a chemotherapy education class before starting the first dose of her treatment. The patient requested referral for a second opinion regarding her condition before starting the treatment. I will refer her to Dr. Florene Glen or one of his associates at [facility] for second opinion. She would come back for follow up visit in 3 weeks for reevaluation and management any adverse effect of her treatment. All questions were answered. The patient knows to call the clinic with any problems, questions or concerns. We can certainly see the patient much sooner if necessary. I spent 15 minutes of face-to-face counseling with the patient and her daughter out of the total visit time 25 minutes.  Disclaimer: This note was dictated with voice recognition software. Similar sounding words can inadvertently be transcribed and may not be corrected upon review.

## 2013-06-24 ENCOUNTER — Telehealth: Payer: Self-pay | Admitting: Internal Medicine

## 2013-06-24 NOTE — Telephone Encounter (Signed)
Pt appt to see Dr. Cassell Clement @ [facility] is 07/02/13@10 :00. Medical records faxed, slides and scans will be fedex'ed. Pt is aware

## 2013-06-25 ENCOUNTER — Other Ambulatory Visit: Payer: Medicare Other

## 2013-06-25 ENCOUNTER — Encounter: Payer: Self-pay | Admitting: *Deleted

## 2013-06-30 ENCOUNTER — Other Ambulatory Visit: Payer: Self-pay | Admitting: *Deleted

## 2013-06-30 ENCOUNTER — Telehealth: Payer: Self-pay | Admitting: *Deleted

## 2013-06-30 ENCOUNTER — Other Ambulatory Visit: Payer: Self-pay | Admitting: Internal Medicine

## 2013-06-30 DIAGNOSIS — C9000 Multiple myeloma not having achieved remission: Secondary | ICD-10-CM

## 2013-06-30 MED ORDER — PROCHLORPERAZINE MALEATE 10 MG PO TABS: 10.0000 mg | ORAL_TABLET | Freq: Four times a day (QID) | ORAL | Status: DC | PRN

## 2013-06-30 NOTE — Telephone Encounter (Signed)
Pt is requesting for 1st cycle of chemo to be delayed until 07/08/13 until after she goes to [facility].  Dr Vista Mink aware SLJ

## 2013-07-01 ENCOUNTER — Ambulatory Visit (HOSPITAL_COMMUNITY)
Admission: RE | Admit: 2013-07-01 | Discharge: 2013-07-01 | Disposition: A | Discharge status: Home / Self Care | Payer: Medicare Other | Source: Ambulatory Visit | Attending: Internal Medicine | Admitting: Internal Medicine

## 2013-07-01 ENCOUNTER — Other Ambulatory Visit: Payer: Medicare Other

## 2013-07-01 ENCOUNTER — Ambulatory Visit: Payer: Medicare Other

## 2013-07-01 DIAGNOSIS — M51379 Other intervertebral disc degeneration, lumbosacral region without mention of lumbar back pain or lower extremity pain: Secondary | ICD-10-CM | POA: Insufficient documentation

## 2013-07-01 DIAGNOSIS — M5137 Other intervertebral disc degeneration, lumbosacral region: Secondary | ICD-10-CM | POA: Insufficient documentation

## 2013-07-01 DIAGNOSIS — C9000 Multiple myeloma not having achieved remission: Secondary | ICD-10-CM | POA: Insufficient documentation

## 2013-07-01 DIAGNOSIS — J984 Other disorders of lung: Secondary | ICD-10-CM | POA: Insufficient documentation

## 2013-07-01 DIAGNOSIS — Z981 Arthrodesis status: Secondary | ICD-10-CM | POA: Insufficient documentation

## 2013-07-06 ENCOUNTER — Telehealth: Payer: Self-pay | Admitting: *Deleted

## 2013-07-06 NOTE — Telephone Encounter (Signed)
Pt called stating [facility] is requesting that pt not start velcade until 07/15/13 while they wait for some lab work to come back.  Pt saw them already and is going back again this week.    She was scheduled for 07/08/13, but will start velcade on 07/15/13.  Informed Dr Vista Mink via Suanne Marker.  SLJ

## 2013-07-08 ENCOUNTER — Ambulatory Visit: Payer: Medicare Other

## 2013-07-08 ENCOUNTER — Other Ambulatory Visit: Payer: Medicare Other

## 2013-07-10 ENCOUNTER — Other Ambulatory Visit: Payer: Self-pay | Admitting: Internal Medicine

## 2013-07-13 ENCOUNTER — Encounter (HOSPITAL_COMMUNITY)
Admission: RE | Admit: 2013-07-13 | Discharge: 2013-07-13 | Disposition: A | Discharge status: Home / Self Care | Payer: Medicare Other | Source: Ambulatory Visit | Attending: Nephrology | Admitting: Nephrology

## 2013-07-13 DIAGNOSIS — N184 Chronic kidney disease, stage 4 (severe): Secondary | ICD-10-CM | POA: Insufficient documentation

## 2013-07-13 DIAGNOSIS — D638 Anemia in other chronic diseases classified elsewhere: Secondary | ICD-10-CM | POA: Insufficient documentation

## 2013-07-13 LAB — IRON AND TIBC
Iron: 93 ug/dL (ref 42–135)
Saturation Ratios: 44 % (ref 20–55)
TIBC: 213 ug/dL — AB (ref 250–470)
UIBC: 120 ug/dL — ABNORMAL LOW (ref 125–400)

## 2013-07-13 LAB — POCT HEMOGLOBIN-HEMACUE: Hemoglobin: 9.6 g/dL — ABNORMAL LOW (ref 12.0–15.0)

## 2013-07-13 LAB — FERRITIN: FERRITIN: 1319 ng/mL — AB (ref 10–291)

## 2013-07-13 MED ORDER — DARBEPOETIN ALFA-POLYSORBATE 200 MCG/0.4ML IJ SOLN
INTRAMUSCULAR | Status: AC
  Filled 2013-07-13: qty 0.4

## 2013-07-13 MED ORDER — DARBEPOETIN ALFA-POLYSORBATE 200 MCG/0.4ML IJ SOLN
200.0000 ug | INTRAMUSCULAR | Status: DC
  Administered 2013-07-13: 10:00:00 200 ug via SUBCUTANEOUS

## 2013-07-15 ENCOUNTER — Ambulatory Visit (HOSPITAL_BASED_OUTPATIENT_CLINIC_OR_DEPARTMENT_OTHER): Payer: Medicare Other | Admitting: Physician Assistant

## 2013-07-15 ENCOUNTER — Other Ambulatory Visit (HOSPITAL_BASED_OUTPATIENT_CLINIC_OR_DEPARTMENT_OTHER): Payer: Medicare Other

## 2013-07-15 ENCOUNTER — Ambulatory Visit (HOSPITAL_BASED_OUTPATIENT_CLINIC_OR_DEPARTMENT_OTHER): Payer: Medicare Other

## 2013-07-15 ENCOUNTER — Encounter: Payer: Self-pay | Admitting: Physician Assistant

## 2013-07-15 ENCOUNTER — Telehealth: Payer: Self-pay | Admitting: Internal Medicine

## 2013-07-15 ENCOUNTER — Ambulatory Visit: Payer: Medicare Other | Admitting: Internal Medicine

## 2013-07-15 VITALS — BP 153/58 | HR 66 | Temp 97.9°F | Resp 17 | Ht 66.0 in | Wt 230.2 lb

## 2013-07-15 DIAGNOSIS — D472 Monoclonal gammopathy: Secondary | ICD-10-CM

## 2013-07-15 DIAGNOSIS — C9000 Multiple myeloma not having achieved remission: Secondary | ICD-10-CM

## 2013-07-15 DIAGNOSIS — D638 Anemia in other chronic diseases classified elsewhere: Secondary | ICD-10-CM

## 2013-07-15 DIAGNOSIS — E8809 Other disorders of plasma-protein metabolism, not elsewhere classified: Secondary | ICD-10-CM

## 2013-07-15 DIAGNOSIS — N186 End stage renal disease: Secondary | ICD-10-CM

## 2013-07-15 DIAGNOSIS — Z5112 Encounter for antineoplastic immunotherapy: Secondary | ICD-10-CM

## 2013-07-15 LAB — CBC WITH DIFFERENTIAL/PLATELET
BASO%: 0.6 % (ref 0.0–2.0)
Basophils Absolute: 0 10*3/uL (ref 0.0–0.1)
EOS%: 1.8 % (ref 0.0–7.0)
Eosinophils Absolute: 0.1 10*3/uL (ref 0.0–0.5)
HEMATOCRIT: 29.3 % — AB (ref 34.8–46.6)
HEMOGLOBIN: 9.8 g/dL — AB (ref 11.6–15.9)
LYMPH%: 15.2 % (ref 14.0–49.7)
MCH: 30.2 pg (ref 25.1–34.0)
MCHC: 33.4 g/dL (ref 31.5–36.0)
MCV: 90.5 fL (ref 79.5–101.0)
MONO#: 0.7 10*3/uL (ref 0.1–0.9)
MONO%: 9.6 % (ref 0.0–14.0)
NEUT#: 5.5 10*3/uL (ref 1.5–6.5)
NEUT%: 72.8 % (ref 38.4–76.8)
Platelets: 178 10*3/uL (ref 145–400)
RBC: 3.24 10*6/uL — ABNORMAL LOW (ref 3.70–5.45)
RDW: 17.4 % — AB (ref 11.2–14.5)
WBC: 7.5 10*3/uL (ref 3.9–10.3)
lymph#: 1.1 10*3/uL (ref 0.9–3.3)

## 2013-07-15 LAB — COMPREHENSIVE METABOLIC PANEL (CC13)
ALBUMIN: 3.6 g/dL (ref 3.5–5.0)
ALT: 14 U/L (ref 0–55)
AST: 21 U/L (ref 5–34)
Alkaline Phosphatase: 71 U/L (ref 40–150)
Anion Gap: 12 mEq/L — ABNORMAL HIGH (ref 3–11)
BUN: 93 mg/dL — ABNORMAL HIGH (ref 7.0–26.0)
CO2: 21 meq/L — AB (ref 22–29)
Calcium: 9.1 mg/dL (ref 8.4–10.4)
Chloride: 108 mEq/L (ref 98–109)
Creatinine: 5.3 mg/dL (ref 0.6–1.1)
Glucose: 186 mg/dl — ABNORMAL HIGH (ref 70–140)
POTASSIUM: 4.4 meq/L (ref 3.5–5.1)
Sodium: 141 mEq/L (ref 136–145)
TOTAL PROTEIN: 7.6 g/dL (ref 6.4–8.3)
Total Bilirubin: 0.3 mg/dL (ref 0.20–1.20)

## 2013-07-15 MED ORDER — ONDANSETRON HCL 8 MG PO TABS
ORAL_TABLET | ORAL | Status: AC
  Filled 2013-07-15: qty 1

## 2013-07-15 MED ORDER — BORTEZOMIB CHEMO SQ INJECTION 3.5 MG (2.5MG/ML)
1.3000 mg/m2 | Freq: Once | INTRAMUSCULAR | Status: AC
  Administered 2013-07-15: 2.75 mg via SUBCUTANEOUS
  Filled 2013-07-15: qty 2.75

## 2013-07-15 MED ORDER — ONDANSETRON HCL 8 MG PO TABS
8.0000 mg | ORAL_TABLET | Freq: Once | ORAL | Status: AC
  Administered 2013-07-15: 8 mg via ORAL

## 2013-07-15 NOTE — Telephone Encounter (Signed)
gv and printed appt sched and avs for pt for Feb and March....sed added tx. °

## 2013-07-15 NOTE — Patient Instructions (Signed)
[facility] Discharge Instructions for Patients Receiving Chemotherapy  Today you received the following chemotherapy agents: Velcade.  To help prevent nausea and vomiting after your treatment, we encourage you to take your nausea medication, Compazine. Take one every 6 hours as needed.   If you develop nausea and vomiting that is not controlled by your nausea medication, call the clinic.   BELOW ARE SYMPTOMS THAT SHOULD BE REPORTED IMMEDIATELY:  *FEVER GREATER THAN 100.5 F  *CHILLS WITH OR WITHOUT FEVER  NAUSEA AND VOMITING THAT IS NOT CONTROLLED WITH YOUR NAUSEA MEDICATION  *UNUSUAL SHORTNESS OF BREATH  *UNUSUAL BRUISING OR BLEEDING  TENDERNESS IN MOUTH AND THROAT WITH OR WITHOUT PRESENCE OF ULCERS  *URINARY PROBLEMS  *BOWEL PROBLEMS  UNUSUAL RASH Items with * indicate a potential emergency and should be followed up as soon as possible.  Feel free to call the clinic should you have any questions or concerns. The clinic phone number is [phone]. It has been my pleasure taking care of you today!

## 2013-07-15 NOTE — Progress Notes (Addendum)
[place] [phone]   Fax:([phone]  SHARED VISIT  PROGRESS NOTE  [name], MD [place] [address]  DIAGNOSIS:  #1 plasma cell dyscrasia, multiple myeloma.  #2 anemia of chronic disease secondary to chronic kidney disease.   CURRENT THERAPY: Treatment with single agent subcutaneous Velcade 1.3 mg/m2 on a weekly basis with Decadron 40 mg by mouth weekly. First cycle to be given 07/15/2013  INTERVAL HISTORY: [name] 80 y.o. female returns to the clinic today for followup visit accompanied by her daughter. The patient is feeling fine today with no specific complaints except for constipation related to her pain medication. She also reports some night sweats to the point of having to change her clothes. She's had some difficulty sleeping but reports this is a long-standing issue. She reports that her appetite is fair. She sometimes doesn't feel like eating but does make herself. She has a shunt in place but is not in expected to start dialysis for 4-6 months depending on her renal function. She has not started the weekly dexamethasone yet and the timing of this has been clarified for her. She denied having any significant chest pain, shortness of breath, cough or hemoptysis.   MEDICAL HISTORY: Past Medical History  Diagnosis Date  . Renal failure   . Hypertension   . Diabetes mellitus   . Arthritis   . Asthma   . GERD (gastroesophageal reflux disease)   . Neuropathy in diabetes     Hx: of  . COPD (chronic obstructive pulmonary disease)   . Anemia     ALLERGIES:  is allergic to penicillins cross reactors.  MEDICATIONS:  Current Outpatient Prescriptions  Medication Sig Dispense Refill  . allopurinol (ZYLOPRIM) 100 MG tablet Take 100 mg by mouth daily with breakfast.       . amLODipine (NORVASC) 5 MG tablet Take 5 mg by mouth daily with breakfast.       . b complex-vitamin c-folic acid  (NEPHRO-VITE) 0.8 MG TABS tablet Take 1 tablet by mouth daily.      . calcitRIOL (ROCALTROL) 0.5 MCG capsule Take 0.5 mcg by mouth daily with breakfast.       . ferrous sulfate 325 (65 FE) MG tablet Take 325 mg by mouth daily.      . furosemide (LASIX) 80 MG tablet Take 80 mg by mouth daily.      Marland Kitchen gabapentin (NEURONTIN) 100 MG capsule Take 100 mg by mouth 3 (three) times daily.      Marland Kitchen glimepiride (AMARYL) 1 MG tablet Take 1 mg by mouth daily with breakfast.       . HYDROcodone-acetaminophen (NORCO) 7.5-325 MG per tablet Take 1 tablet by mouth every 6 (six) hours as needed for moderate pain.      . Oxycodone HCl 20 MG TABS 20 mg.      . pantoprazole (PROTONIX) 40 MG tablet Take 40 mg by mouth 2 (two) times daily.       . prochlorperazine (COMPAZINE) 10 MG tablet Take 1 tablet (10 mg total) by mouth every 6 (six) hours as needed for nausea or vomiting.  30 tablet  0  . saxagliptin HCl (ONGLYZA) 2.5 MG TABS tablet Take 2.5 mg by mouth daily with breakfast.       . acyclovir (ZOVIRAX) 400 MG tablet Take 1 tablet (400 mg total) by mouth 2 (two) times daily.  60 tablet  2  . dexamethasone (DECADRON) 4  MG tablet 10 tablet by mouth every week starting with the first dose of the chemotherapy.  80 tablet  1  . oxyCODONE-acetaminophen (PERCOCET/ROXICET) 5-325 MG per tablet        No current facility-administered medications for this visit.    SURGICAL HISTORY:  Past Surgical History  Procedure Laterality Date  . Lobectomy    . Cholecystectomy    . Appendectomy    . Tubal ligation    . Fistula left arm Left 2011    never used  . Colonoscopy      Hx: of  . Tummy tuck      Hx: of  . Abdominal hysterectomy    . Av fistula placement Left 05/15/2013    Procedure: INSERTION OF ARTERIOVENOUS (AV) GORE-TEX GRAFT ARM-LEFT UPPER ARM;  Surgeon: Mal Misty, MD;  Location: [facility];  Service: Vascular;  Laterality: Left;    REVIEW OF SYSTEMS:  Constitutional: positive for anorexia, fatigue and night  sweats Eyes: negative Ears, nose, mouth, throat, and face: negative Respiratory: negative Cardiovascular: negative Gastrointestinal: positive for constipation Genitourinary:negative Integument/breast: negative Hematologic/lymphatic: negative Musculoskeletal:negative Neurological: negative Behavioral/Psych: negative Endocrine: negative Allergic/Immunologic: negative   PHYSICAL EXAMINATION: General appearance: alert, cooperative, fatigued and no distress Head: Normocephalic, without obvious abnormality, atraumatic Neck: no adenopathy Lymph nodes: Cervical, supraclavicular, and axillary nodes normal. Resp: clear to auscultation bilaterally Back: symmetric, no curvature. ROM normal. No CVA tenderness. Cardio: regular rate and rhythm, S1, S2 normal, no murmur, click, rub or gallop GI: soft, non-tender; bowel sounds normal; no masses,  no organomegaly Extremities: extremities normal, atraumatic, no cyanosis or edema Neurologic: Alert and oriented X 3, normal strength and tone. Normal symmetric reflexes. Normal coordination and gait  ECOG PERFORMANCE STATUS: 2 - Symptomatic, <50% confined to bed  Blood pressure 153/58, pulse 66, temperature 97.9 F (36.6 C), temperature source Oral, resp. rate 17, height $RemoveBe'5\' 6"'fqBsdfhdp$  (1.676 m), weight 230 lb 3.2 oz (104.418 kg), SpO2 100.00%.  LABORATORY DATA: Lab Results  Component Value Date   WBC 7.5 07/15/2013   HGB 9.8* 07/15/2013   HCT 29.3* 07/15/2013   MCV 90.5 07/15/2013   PLT 178 07/15/2013      Chemistry      Component Value Date/Time   NA 141 07/15/2013 1000   NA 143 06/15/2013 0951   K 4.4 07/15/2013 1000   K 4.2 06/15/2013 0951   CL 105 06/15/2013 0951   CL 98 09/17/2012 1242   CO2 21* 07/15/2013 1000   CO2 22 06/15/2013 0951   BUN 93.0* 07/15/2013 1000   BUN 80* 06/15/2013 0951   CREATININE 5.3* 07/15/2013 1000   CREATININE 4.75* 06/15/2013 0951      Component Value Date/Time   CALCIUM 9.1 07/15/2013 1000   CALCIUM 8.7 06/15/2013 0951    CALCIUM 8.6 12/02/2012 0933   ALKPHOS 71 07/15/2013 1000   ALKPHOS 81 03/26/2012 1643   AST 21 07/15/2013 1000   AST 23 03/26/2012 1643   ALT 14 07/15/2013 1000   ALT 14 03/26/2012 1643   BILITOT 0.30 07/15/2013 1000   BILITOT 0.2* 03/26/2012 1643     Other lab results performed on 05/19/2013: beta-2 microglobulin 13.29, free kappa light chain 7.55, free lambda light chain 115.00 with a kappa/lambda ratio of 0.07. IgG 1770, IgA 94 and IgM 55.  BONE MARROW REPORT FINAL DIAGNOSIS Diagnosis Bone Marrow, Aspirate,Biopsy, and Clot, right iliac - SLIGHTLY HYPERCELLULAR BONE MARROW FOR AGE WITH TRILINEAGE HEMATOPOIESIS. - MILD PLASMACYTOSIS (PLASMA CELLS 8%) - SEE COMMENT. PERIPHERAL BLOOD: -  NORMOCYTIC-NORMOCHROMIC ANEMIA. Diagnosis Note The bone marrow is slightly hypercellular for age with trilineage hematopoiesis and nonspecific changes. The plasma cells represent 8% of all cells with lack of large aggregates or sheets. Immunohistochemical stains show that the plasma cells display lambda light chain excess. The features are most suggestive of plasma cell dyscrasia. Clinical correlation is recommended. (BNS:kh 06-10-13) Guerry Bruin MD Pathologist, Electronic Signature (Case signed 06/10/2013)  Radiology Studies: Dg Bone Survey Met  07/01/2013   CLINICAL DATA:  Multiple myeloma  EXAM: METASTATIC BONE SURVEY  COMPARISON:  None.  FINDINGS: No lytic or sclerotic bone lesions are seen suggestive of myeloma.  Cervical fusion.  Anterior plate at F0-1.  No spinal fracture.  Pleural and parenchymal scarring in the left lung base. No lung mass. Degenerative changes in the knee bilaterally. Lumbar disc degeneration L3-4 and L4-5.  IMPRESSION: Negative for lytic or sclerotic bone lesion. No fracture or acute bony abnormality.   Electronically Signed   By: Marlan Palau M.D.   On: 07/01/2013 13:38    ASSESSMENT: This is a very pleasant 80 years old [place] American female with history of monoclonal  gammopathy of undetermined significance as well as anemia of chronic disease. Her last bone marrow biopsy and aspirate in May of 2014 showed only 5% plasma cells, but the patient continues to have worsening of her renal function in addition to increase in the free lambda light chain. Repeat bone marrow biopsy and aspirate on 06/08/2013 showed increase of the plasma cells to 8% with lambda light chain specificity suspicious for plasma cell dyscrasia.  PLAN: The patient was discussed with and also seen by Dr. Arbutus Ped. She will proceed with weekly subcutaneous Velcade as planned with her first cycle today Velcade will be given at 1.3 mg per meter squared subcutaneously on a weekly basis with Decadron 40 mg by mouth weekly. She'll return in 3 weeks for a symptom management visit or sooner as needed. The structure of our treatment visits was explained to the patient and her daughter by Dr. Arbutus Ped. He  explained that the patient would be seen by either the physician or an advanced practice provider for symptom management visit during her chemotherapy treatment. and the patient voiced understanding however the daughter was less than pleased. The skeletal bone survey was negative for lytic lesions. She is advised to monitor her blood sugars closely as the dexamethasone at may cause a rise in her blood sugar readings necessitating adjustments in her blood sugar medications.   All questions were answered. The patient knows to call the clinic with any problems, questions or concerns. We can certainly see the patient much sooner if necessary.  Conni Slipper PA-C  ADDENDUM:  Hematology/Oncology Attending:  I had a face to face encounter with the patient. I recommended her care plan.  This is a very pleasant 80 years old [place] American female recently diagnosed with plasma cell dyscrasia, multiple myeloma. The patient also has a history of anemia of chronic disease secondary to end-stage renal disease. She is  here today to start the first dose of her treatment with subcutaneous Velcade and Decadron. She was seen recently at [facility] for second opinion and they agreed with the current plan. I reminded the patient was adverse effect of this treatment including but not limited to mild alopecia, myelosuppression, nausea and vomiting as well as peripheral neuropathy as well as hyperglycemia. She would like to proceed with treatment as planned. She would come back for followup visit in 3  weeks for evaluation and management any adverse effect of her treatment. She was advised to call immediately if she has any concerning symptoms in the interval.  Disclaimer: This note was dictated with voice recognition software. Similar sounding words can inadvertently be transcribed and may not be corrected upon review. Eilleen Kempf., MD 07/19/2013

## 2013-07-16 ENCOUNTER — Telehealth: Payer: Self-pay | Admitting: *Deleted

## 2013-07-16 NOTE — Telephone Encounter (Signed)
Called pt at home for post chemo follow up call.  Pt stated she was doing fine except for increased blood sugar from the steroids.  Pt stated she had called her primary x 2 and awaiting return call from his office.   Denied nausea/vomiting; bowel and bladder function fine; denied pain.  Stated good appetite and drinking lots of fluids as tolerated.  Pt aware of next office appts.  Pt understood to call office with any new problems.

## 2013-07-16 NOTE — Progress Notes (Signed)
[facility] Psychosocial Distress Screening Clinical Social Work  Clinical Social Work was referred by distress screening protocol.  The patient scored a 9 on the Psychosocial Distress Thermometer which indicates severe distress. Clinical Social Worker Intern telephoned to assess for distress and other psychosocial needs. Patient stated that she "took sugar" at various times and has noted an increase.  Patient contacted [facility] yesterday evening and was informed of what to do.  Patient did note a decrease.  This morning Patient stated it was increasing again and has contacted Dr.  Patient is currently waiting to hear from Dr.  Patient informed [facility] that she currently had no other concerns.  Patient agrees to seek out further assistance if needed.   Clinical Social Worker follow up needed: no  If yes, follow up plan:   Kerby Hockley S. [place] Work Intern [employer] [phone]

## 2013-07-16 NOTE — Telephone Encounter (Signed)
Pt called stating that her blood sugars are high up in the 300-400 range.  She has a call into her PCP but wanted to know what to do.  Per Dr Vista Mink, pt's PCP will oversee her hyperglycemia.  Pt verbalized understanding.  SLJ

## 2013-07-17 NOTE — Patient Instructions (Signed)
Monitor your blood sugar readings closely Continue with labs and chemotherapy weekly as scheduled Remember to take your dexamethasone weekly as prescribed Followup in 3 weeks for symptom management visit or sooner if needed

## 2013-07-22 ENCOUNTER — Ambulatory Visit (HOSPITAL_BASED_OUTPATIENT_CLINIC_OR_DEPARTMENT_OTHER): Payer: Medicare Other

## 2013-07-22 ENCOUNTER — Other Ambulatory Visit (HOSPITAL_BASED_OUTPATIENT_CLINIC_OR_DEPARTMENT_OTHER): Payer: Medicare Other

## 2013-07-22 VITALS — BP 148/58 | HR 64 | Temp 97.3°F | Resp 18

## 2013-07-22 DIAGNOSIS — C9000 Multiple myeloma not having achieved remission: Secondary | ICD-10-CM

## 2013-07-22 DIAGNOSIS — Z5112 Encounter for antineoplastic immunotherapy: Secondary | ICD-10-CM

## 2013-07-22 LAB — CBC WITH DIFFERENTIAL/PLATELET
BASO%: 0.1 % (ref 0.0–2.0)
BASOS ABS: 0 10*3/uL (ref 0.0–0.1)
EOS%: 1.6 % (ref 0.0–7.0)
Eosinophils Absolute: 0.1 10*3/uL (ref 0.0–0.5)
HCT: 31.1 % — ABNORMAL LOW (ref 34.8–46.6)
HGB: 10.4 g/dL — ABNORMAL LOW (ref 11.6–15.9)
LYMPH#: 1.2 10*3/uL (ref 0.9–3.3)
LYMPH%: 15 % (ref 14.0–49.7)
MCH: 29.3 pg (ref 25.1–34.0)
MCHC: 33.4 g/dL (ref 31.5–36.0)
MCV: 87.6 fL (ref 79.5–101.0)
MONO#: 0.5 10*3/uL (ref 0.1–0.9)
MONO%: 6.8 % (ref 0.0–14.0)
NEUT#: 6.1 10*3/uL (ref 1.5–6.5)
NEUT%: 76.5 % (ref 38.4–76.8)
NRBC: 0 % (ref 0–0)
Platelets: 206 10*3/uL (ref 145–400)
RBC: 3.55 10*6/uL — AB (ref 3.70–5.45)
RDW: 17.7 % — ABNORMAL HIGH (ref 11.2–14.5)
WBC: 8 10*3/uL (ref 3.9–10.3)

## 2013-07-22 LAB — COMPREHENSIVE METABOLIC PANEL (CC13)
ALK PHOS: 71 U/L (ref 40–150)
ALT: 14 U/L (ref 0–55)
AST: 13 U/L (ref 5–34)
Albumin: 3.7 g/dL (ref 3.5–5.0)
Anion Gap: 11 mEq/L (ref 3–11)
BILIRUBIN TOTAL: 0.31 mg/dL (ref 0.20–1.20)
BUN: 96.3 mg/dL — ABNORMAL HIGH (ref 7.0–26.0)
CO2: 25 mEq/L (ref 22–29)
Calcium: 9.9 mg/dL (ref 8.4–10.4)
Chloride: 107 mEq/L (ref 98–109)
Creatinine: 4.6 mg/dL (ref 0.6–1.1)
GLUCOSE: 120 mg/dL (ref 70–140)
Potassium: 4.2 mEq/L (ref 3.5–5.1)
SODIUM: 142 meq/L (ref 136–145)
TOTAL PROTEIN: 7.7 g/dL (ref 6.4–8.3)

## 2013-07-22 MED ORDER — BORTEZOMIB CHEMO SQ INJECTION 3.5 MG (2.5MG/ML)
1.3000 mg/m2 | Freq: Once | INTRAMUSCULAR | Status: AC
  Administered 2013-07-22: 2.75 mg via SUBCUTANEOUS
  Filled 2013-07-22: qty 2.75

## 2013-07-22 MED ORDER — ONDANSETRON HCL 8 MG PO TABS
ORAL_TABLET | ORAL | Status: AC
  Filled 2013-07-22: qty 1

## 2013-07-22 MED ORDER — ONDANSETRON HCL 8 MG PO TABS
8.0000 mg | ORAL_TABLET | Freq: Once | ORAL | Status: AC
  Administered 2013-07-22: 8 mg via ORAL

## 2013-07-22 NOTE — Patient Instructions (Signed)
[facility] Discharge Instructions for Patients Receiving Chemotherapy  Today you received the following chemotherapy agents VELCADE To help prevent nausea and vomiting after your treatment, we encourage you to take your nausea medication as prescribed. If you develop nausea and vomiting that is not controlled by your nausea medication, call the clinic.   BELOW ARE SYMPTOMS THAT SHOULD BE REPORTED IMMEDIATELY:  *FEVER GREATER THAN 100.5 F  *CHILLS WITH OR WITHOUT FEVER  NAUSEA AND VOMITING THAT IS NOT CONTROLLED WITH YOUR NAUSEA MEDICATION  *UNUSUAL SHORTNESS OF BREATH  *UNUSUAL BRUISING OR BLEEDING  TENDERNESS IN MOUTH AND THROAT WITH OR WITHOUT PRESENCE OF ULCERS  *URINARY PROBLEMS  *BOWEL PROBLEMS  UNUSUAL RASH Items with * indicate a potential emergency and should be followed up as soon as possible.  Feel free to call the clinic you have any questions or concerns. The clinic phone number is [phone].    

## 2013-07-29 ENCOUNTER — Ambulatory Visit (HOSPITAL_BASED_OUTPATIENT_CLINIC_OR_DEPARTMENT_OTHER): Payer: Medicare Other

## 2013-07-29 ENCOUNTER — Other Ambulatory Visit (HOSPITAL_BASED_OUTPATIENT_CLINIC_OR_DEPARTMENT_OTHER): Payer: Medicare Other

## 2013-07-29 VITALS — BP 156/65 | HR 62 | Temp 98.4°F | Resp 18

## 2013-07-29 DIAGNOSIS — C9000 Multiple myeloma not having achieved remission: Secondary | ICD-10-CM

## 2013-07-29 DIAGNOSIS — Z5112 Encounter for antineoplastic immunotherapy: Secondary | ICD-10-CM

## 2013-07-29 LAB — CBC WITH DIFFERENTIAL/PLATELET
BASO%: 0.4 % (ref 0.0–2.0)
Basophils Absolute: 0 10*3/uL (ref 0.0–0.1)
EOS%: 2 % (ref 0.0–7.0)
Eosinophils Absolute: 0.1 10*3/uL (ref 0.0–0.5)
HEMATOCRIT: 32.4 % — AB (ref 34.8–46.6)
HGB: 10.5 g/dL — ABNORMAL LOW (ref 11.6–15.9)
LYMPH%: 8.9 % — ABNORMAL LOW (ref 14.0–49.7)
MCH: 29.5 pg (ref 25.1–34.0)
MCHC: 32.5 g/dL (ref 31.5–36.0)
MCV: 90.9 fL (ref 79.5–101.0)
MONO#: 0.8 10*3/uL (ref 0.1–0.9)
MONO%: 12.1 % (ref 0.0–14.0)
NEUT#: 4.9 10*3/uL (ref 1.5–6.5)
NEUT%: 76.6 % (ref 38.4–76.8)
PLATELETS: 190 10*3/uL (ref 145–400)
RBC: 3.57 10*6/uL — ABNORMAL LOW (ref 3.70–5.45)
RDW: 18.5 % — ABNORMAL HIGH (ref 11.2–14.5)
WBC: 6.4 10*3/uL (ref 3.9–10.3)
lymph#: 0.6 10*3/uL — ABNORMAL LOW (ref 0.9–3.3)

## 2013-07-29 LAB — COMPREHENSIVE METABOLIC PANEL (CC13)
ALT: 13 U/L (ref 0–55)
ANION GAP: 10 meq/L (ref 3–11)
AST: 13 U/L (ref 5–34)
Albumin: 3.5 g/dL (ref 3.5–5.0)
Alkaline Phosphatase: 64 U/L (ref 40–150)
BUN: 84 mg/dL — ABNORMAL HIGH (ref 7.0–26.0)
CO2: 27 meq/L (ref 22–29)
Calcium: 8.8 mg/dL (ref 8.4–10.4)
Chloride: 104 mEq/L (ref 98–109)
Creatinine: 4.3 mg/dL (ref 0.6–1.1)
Glucose: 195 mg/dl — ABNORMAL HIGH (ref 70–140)
Potassium: 4.7 mEq/L (ref 3.5–5.1)
Sodium: 141 mEq/L (ref 136–145)
Total Bilirubin: 0.38 mg/dL (ref 0.20–1.20)
Total Protein: 7.5 g/dL (ref 6.4–8.3)

## 2013-07-29 MED ORDER — ONDANSETRON HCL 8 MG PO TABS
ORAL_TABLET | ORAL | Status: AC
  Filled 2013-07-29: qty 1

## 2013-07-29 MED ORDER — ONDANSETRON HCL 8 MG PO TABS
8.0000 mg | ORAL_TABLET | Freq: Once | ORAL | Status: AC
  Administered 2013-07-29: 8 mg via ORAL

## 2013-07-29 MED ORDER — BORTEZOMIB CHEMO SQ INJECTION 3.5 MG (2.5MG/ML)
1.3000 mg/m2 | Freq: Once | INTRAMUSCULAR | Status: AC
  Administered 2013-07-29: 2.75 mg via SUBCUTANEOUS
  Filled 2013-07-29: qty 2.75

## 2013-07-29 NOTE — Patient Instructions (Signed)
[facility] Discharge Instructions for Patients Receiving Chemotherapy  Today you received the following chemotherapy agents: Velcade  To help prevent nausea and vomiting after your treatment, we encourage you to take your nausea medication as prescribed by your physician.   If you develop nausea and vomiting that is not controlled by your nausea medication, call the clinic.   BELOW ARE SYMPTOMS THAT SHOULD BE REPORTED IMMEDIATELY:  *FEVER GREATER THAN 100.5 F  *CHILLS WITH OR WITHOUT FEVER  NAUSEA AND VOMITING THAT IS NOT CONTROLLED WITH YOUR NAUSEA MEDICATION  *UNUSUAL SHORTNESS OF BREATH  *UNUSUAL BRUISING OR BLEEDING  TENDERNESS IN MOUTH AND THROAT WITH OR WITHOUT PRESENCE OF ULCERS  *URINARY PROBLEMS  *BOWEL PROBLEMS  UNUSUAL RASH Items with * indicate a potential emergency and should be followed up as soon as possible.  Feel free to call the clinic you have any questions or concerns. The clinic phone number is [phone].

## 2013-08-05 ENCOUNTER — Ambulatory Visit (HOSPITAL_BASED_OUTPATIENT_CLINIC_OR_DEPARTMENT_OTHER): Payer: Medicare Other | Admitting: Internal Medicine

## 2013-08-05 ENCOUNTER — Encounter: Payer: Self-pay | Admitting: Internal Medicine

## 2013-08-05 ENCOUNTER — Ambulatory Visit (HOSPITAL_BASED_OUTPATIENT_CLINIC_OR_DEPARTMENT_OTHER): Payer: Medicare Other

## 2013-08-05 ENCOUNTER — Other Ambulatory Visit (HOSPITAL_BASED_OUTPATIENT_CLINIC_OR_DEPARTMENT_OTHER): Payer: Medicare Other

## 2013-08-05 ENCOUNTER — Telehealth: Payer: Self-pay | Admitting: *Deleted

## 2013-08-05 ENCOUNTER — Other Ambulatory Visit: Payer: Self-pay | Admitting: *Deleted

## 2013-08-05 VITALS — BP 149/55 | HR 60 | Temp 98.5°F | Resp 18 | Ht 66.0 in | Wt 216.2 lb

## 2013-08-05 DIAGNOSIS — N189 Chronic kidney disease, unspecified: Secondary | ICD-10-CM

## 2013-08-05 DIAGNOSIS — E8809 Other disorders of plasma-protein metabolism, not elsewhere classified: Secondary | ICD-10-CM

## 2013-08-05 DIAGNOSIS — R5381 Other malaise: Secondary | ICD-10-CM

## 2013-08-05 DIAGNOSIS — C9000 Multiple myeloma not having achieved remission: Secondary | ICD-10-CM

## 2013-08-05 DIAGNOSIS — Z5112 Encounter for antineoplastic immunotherapy: Secondary | ICD-10-CM

## 2013-08-05 DIAGNOSIS — R5383 Other fatigue: Secondary | ICD-10-CM

## 2013-08-05 LAB — CBC WITH DIFFERENTIAL/PLATELET
BASO%: 0.9 % (ref 0.0–2.0)
Basophils Absolute: 0 10*3/uL (ref 0.0–0.1)
EOS%: 2.2 % (ref 0.0–7.0)
Eosinophils Absolute: 0.1 10*3/uL (ref 0.0–0.5)
HEMATOCRIT: 34 % — AB (ref 34.8–46.6)
HGB: 11.1 g/dL — ABNORMAL LOW (ref 11.6–15.9)
LYMPH%: 24.5 % (ref 14.0–49.7)
MCH: 29.3 pg (ref 25.1–34.0)
MCHC: 32.6 g/dL (ref 31.5–36.0)
MCV: 89.8 fL (ref 79.5–101.0)
MONO#: 0.6 10*3/uL (ref 0.1–0.9)
MONO%: 13 % (ref 0.0–14.0)
NEUT%: 59.4 % (ref 38.4–76.8)
NEUTROS ABS: 2.9 10*3/uL (ref 1.5–6.5)
Platelets: 166 10*3/uL (ref 145–400)
RBC: 3.79 10*6/uL (ref 3.70–5.45)
RDW: 18.1 % — ABNORMAL HIGH (ref 11.2–14.5)
WBC: 4.8 10*3/uL (ref 3.9–10.3)
lymph#: 1.2 10*3/uL (ref 0.9–3.3)

## 2013-08-05 LAB — COMPREHENSIVE METABOLIC PANEL (CC13)
ALBUMIN: 3.6 g/dL (ref 3.5–5.0)
ALT: 15 U/L (ref 0–55)
AST: 22 U/L (ref 5–34)
Alkaline Phosphatase: 59 U/L (ref 40–150)
Anion Gap: 13 mEq/L — ABNORMAL HIGH (ref 3–11)
BUN: 77.6 mg/dL — ABNORMAL HIGH (ref 7.0–26.0)
CO2: 28 meq/L (ref 22–29)
CREATININE: 4.5 mg/dL — AB (ref 0.6–1.1)
Calcium: 8.8 mg/dL (ref 8.4–10.4)
Chloride: 101 mEq/L (ref 98–109)
Glucose: 133 mg/dl (ref 70–140)
Potassium: 3.8 mEq/L (ref 3.5–5.1)
Sodium: 142 mEq/L (ref 136–145)
Total Bilirubin: 0.42 mg/dL (ref 0.20–1.20)
Total Protein: 7.8 g/dL (ref 6.4–8.3)

## 2013-08-05 MED ORDER — ONDANSETRON HCL 8 MG PO TABS: 8.0000 mg | ORAL_TABLET | Freq: Three times a day (TID) | ORAL | Status: AC | PRN

## 2013-08-05 MED ORDER — ONDANSETRON HCL 8 MG PO TABS
ORAL_TABLET | ORAL | Status: AC
  Filled 2013-08-05: qty 1

## 2013-08-05 MED ORDER — ONDANSETRON HCL 8 MG PO TABS
8.0000 mg | ORAL_TABLET | Freq: Once | ORAL | Status: AC
  Administered 2013-08-05: 8 mg via ORAL

## 2013-08-05 MED ORDER — BORTEZOMIB CHEMO SQ INJECTION 3.5 MG (2.5MG/ML)
1.3000 mg/m2 | Freq: Once | INTRAMUSCULAR | Status: AC
  Administered 2013-08-05: 2.75 mg via SUBCUTANEOUS
  Filled 2013-08-05: qty 2.75

## 2013-08-05 NOTE — Telephone Encounter (Signed)
appts made printed...Marland KitchenMarland Kitchentd

## 2013-08-05 NOTE — Progress Notes (Signed)
[place] Telephone:(336) [phone]   Fax:(336) [phone]  OFFICE PROGRESS NOTE  [name], MD [place] [address]  DIAGNOSIS:  #1 plasma cell dyscrasia, multiple myeloma.  #2 anemia of chronic disease secondary to chronic kidney disease.   CURRENT THERAPY: Treatment with single agent subcutaneous Velcade 1.3 mg/m2 on a weekly basis with Decadron 40 mg by mouth weekly, status post 3 cycles. First cycle to be given 07/15/2013   INTERVAL HISTORY: [name] 80 y.o. female returns to the clinic today for followup visit accompanied by her daughter. The patient is feeling fine today with no specific complaints except for increasing fatigue and a lot of other vague complaints. She tolerated the first 3 weeks of her treatment with subcutaneous Velcade and Decadron fairly well. She denied having any significant nausea or vomiting, no fever or chills. She denied having any significant weight loss or night sweats. The patient denied having any peripheral neuropathy.  MEDICAL HISTORY: Past Medical History  Diagnosis Date  . Renal failure   . Hypertension   . Diabetes mellitus   . Arthritis   . Asthma   . GERD (gastroesophageal reflux disease)   . Neuropathy in diabetes     Hx: of  . COPD (chronic obstructive pulmonary disease)   . Anemia     ALLERGIES:  is allergic to penicillins cross reactors.  MEDICATIONS:  Current Outpatient Prescriptions  Medication Sig Dispense Refill  . acyclovir (ZOVIRAX) 400 MG tablet Take 1 tablet (400 mg total) by mouth 2 (two) times daily.  60 tablet  2  . amLODipine (NORVASC) 5 MG tablet Take 5 mg by mouth daily with breakfast.       . b complex-vitamin c-folic acid (NEPHRO-VITE) 0.8 MG TABS tablet Take 1 tablet by mouth daily.      Marland Kitchen dexamethasone (DECADRON) 4 MG tablet 10 tablet by mouth every week starting with the first dose of the chemotherapy.  80 tablet  1  . ferrous sulfate 325 (65 FE)  MG tablet Take 325 mg by mouth daily.      . furosemide (LASIX) 80 MG tablet Take 80 mg by mouth daily.      Marland Kitchen gabapentin (NEURONTIN) 100 MG capsule Take 100 mg by mouth 3 (three) times daily.      Marland Kitchen glimepiride (AMARYL) 1 MG tablet Take 1 mg by mouth daily with breakfast.       . HYDROcodone-acetaminophen (NORCO) 7.5-325 MG per tablet Take 1 tablet by mouth every 6 (six) hours as needed for moderate pain.      . Oxycodone HCl 20 MG TABS 20 mg.      . oxyCODONE-acetaminophen (PERCOCET/ROXICET) 5-325 MG per tablet       . pantoprazole (PROTONIX) 40 MG tablet Take 40 mg by mouth 2 (two) times daily.       . prochlorperazine (COMPAZINE) 10 MG tablet Take 1 tablet (10 mg total) by mouth every 6 (six) hours as needed for nausea or vomiting.  30 tablet  0  . saxagliptin HCl (ONGLYZA) 2.5 MG TABS tablet Take 2.5 mg by mouth daily with breakfast.       . allopurinol (ZYLOPRIM) 100 MG tablet Take 100 mg by mouth daily with breakfast.       . calcitRIOL (ROCALTROL) 0.5 MCG capsule Take 0.5 mcg by mouth daily with breakfast.        No current facility-administered medications for this visit.    SURGICAL  HISTORY:  Past Surgical History  Procedure Laterality Date  . Lobectomy    . Cholecystectomy    . Appendectomy    . Tubal ligation    . Fistula left arm Left 2011    never used  . Colonoscopy      Hx: of  . Tummy tuck      Hx: of  . Abdominal hysterectomy    . Av fistula placement Left 05/15/2013    Procedure: INSERTION OF ARTERIOVENOUS (AV) GORE-TEX GRAFT ARM-LEFT UPPER ARM;  Surgeon: Mal Misty, MD;  Location: [facility];  Service: Vascular;  Laterality: Left;    REVIEW OF SYSTEMS:  Constitutional: positive for fatigue Eyes: negative Ears, nose, mouth, throat, and face: negative Respiratory: negative Cardiovascular: negative Gastrointestinal: negative Genitourinary:negative Integument/breast: negative Hematologic/lymphatic: negative Musculoskeletal:negative Neurological:  negative Behavioral/Psych: negative Endocrine: negative Allergic/Immunologic: negative   PHYSICAL EXAMINATION: General appearance: alert, cooperative, fatigued and no distress Head: Normocephalic, without obvious abnormality, atraumatic Neck: no adenopathy Lymph nodes: Cervical, supraclavicular, and axillary nodes normal. Resp: clear to auscultation bilaterally Back: symmetric, no curvature. ROM normal. No CVA tenderness. Cardio: regular rate and rhythm, S1, S2 normal, no murmur, click, rub or gallop GI: soft, non-tender; bowel sounds normal; no masses,  no organomegaly Extremities: extremities normal, atraumatic, no cyanosis or edema Neurologic: Alert and oriented X 3, normal strength and tone. Normal symmetric reflexes. Normal coordination and gait  ECOG PERFORMANCE STATUS: 2 - Symptomatic, <50% confined to bed  Blood pressure 149/55, pulse 60, temperature 98.5 F (36.9 C), temperature source Oral, resp. rate 18, height $RemoveBe'5\' 6"'dYXUEPvYW$  (1.676 m), weight 216 lb 3.2 oz (98.068 kg), SpO2 99.00%.  LABORATORY DATA: Lab Results  Component Value Date   WBC 4.8 08/05/2013   HGB 11.1* 08/05/2013   HCT 34.0* 08/05/2013   MCV 89.8 08/05/2013   PLT 166 08/05/2013      Chemistry      Component Value Date/Time   NA 141 07/29/2013 0933   NA 143 06/15/2013 0951   K 4.7 07/29/2013 0933   K 4.2 06/15/2013 0951   CL 105 06/15/2013 0951   CL 98 09/17/2012 1242   CO2 27 07/29/2013 0933   CO2 22 06/15/2013 0951   BUN 84.0* 07/29/2013 0933   BUN 80* 06/15/2013 0951   CREATININE 4.3* 07/29/2013 0933   CREATININE 4.75* 06/15/2013 0951      Component Value Date/Time   CALCIUM 8.8 07/29/2013 0933   CALCIUM 8.7 06/15/2013 0951   CALCIUM 8.6 12/02/2012 0933   ALKPHOS 64 07/29/2013 0933   ALKPHOS 81 03/26/2012 1643   AST 13 07/29/2013 0933   AST 23 03/26/2012 1643   ALT 13 07/29/2013 0933   ALT 14 03/26/2012 1643   BILITOT 0.38 07/29/2013 0933   BILITOT 0.2* 03/26/2012 1643       ASSESSMENT: This is a very pleasant 80  years old [place] American female with plasma cell dyscrasia and currently on treatment with weekly subcutaneous Velcade and Decadron status post 3 weekly doses and tolerating it fairly well. I recommended for the patient to continue her current treatment with Velcade and Decadron as scheduled. The patient has chronic renal failure and is followed by [facility]. I would see her back for followup visit in 3 weeks for reevaluation and management any adverse effect of her treatment. She was advised to call immediately if she has any concerning symptoms in the interval.  Disclaimer: This note was dictated with voice recognition software. Similar sounding words can inadvertently be transcribed  and may not be corrected upon review.

## 2013-08-05 NOTE — Patient Instructions (Signed)
[facility] Discharge Instructions for Patients Receiving Chemotherapy  Today you received the following chemotherapy agents velcade   To help prevent nausea and vomiting after your treatment, we encourage you to take your nausea medication as directed  If you develop nausea and vomiting that is not controlled by your nausea medication, call the clinic.   BELOW ARE SYMPTOMS THAT SHOULD BE REPORTED IMMEDIATELY:  *FEVER GREATER THAN 100.5 F  *CHILLS WITH OR WITHOUT FEVER  NAUSEA AND VOMITING THAT IS NOT CONTROLLED WITH YOUR NAUSEA MEDICATION  *UNUSUAL SHORTNESS OF BREATH  *UNUSUAL BRUISING OR BLEEDING  TENDERNESS IN MOUTH AND THROAT WITH OR WITHOUT PRESENCE OF ULCERS  *URINARY PROBLEMS  *BOWEL PROBLEMS  UNUSUAL RASH Items with * indicate a potential emergency and should be followed up as soon as possible.  Feel free to call the clinic you have any questions or concerns. The clinic phone number is [phone].  

## 2013-08-07 ENCOUNTER — Other Ambulatory Visit (HOSPITAL_COMMUNITY): Payer: Self-pay | Admitting: *Deleted

## 2013-08-10 ENCOUNTER — Encounter (HOSPITAL_COMMUNITY)
Admission: RE | Admit: 2013-08-10 | Discharge: 2013-08-10 | Disposition: A | Discharge status: Home / Self Care | Payer: Medicare Other | Source: Ambulatory Visit | Attending: Nephrology | Admitting: Nephrology

## 2013-08-10 DIAGNOSIS — N184 Chronic kidney disease, stage 4 (severe): Secondary | ICD-10-CM | POA: Insufficient documentation

## 2013-08-10 DIAGNOSIS — D638 Anemia in other chronic diseases classified elsewhere: Secondary | ICD-10-CM | POA: Insufficient documentation

## 2013-08-10 LAB — IRON AND TIBC
Iron: 82 ug/dL (ref 42–135)
Saturation Ratios: 36 % (ref 20–55)
TIBC: 225 ug/dL — ABNORMAL LOW (ref 250–470)
UIBC: 143 ug/dL (ref 125–400)

## 2013-08-10 LAB — POCT HEMOGLOBIN-HEMACUE: Hemoglobin: 11.3 g/dL — ABNORMAL LOW (ref 12.0–15.0)

## 2013-08-10 LAB — FERRITIN: Ferritin: 1844 ng/mL — ABNORMAL HIGH (ref 10–291)

## 2013-08-10 MED ORDER — DARBEPOETIN ALFA-POLYSORBATE 200 MCG/0.4ML IJ SOLN: 200.0000 ug | INTRAMUSCULAR | Status: DC

## 2013-08-12 ENCOUNTER — Ambulatory Visit (HOSPITAL_BASED_OUTPATIENT_CLINIC_OR_DEPARTMENT_OTHER): Payer: Medicare Other

## 2013-08-12 ENCOUNTER — Other Ambulatory Visit (HOSPITAL_BASED_OUTPATIENT_CLINIC_OR_DEPARTMENT_OTHER): Payer: Medicare Other

## 2013-08-12 VITALS — BP 142/56 | HR 60 | Temp 97.5°F | Resp 18

## 2013-08-12 DIAGNOSIS — C9000 Multiple myeloma not having achieved remission: Secondary | ICD-10-CM

## 2013-08-12 DIAGNOSIS — Z5112 Encounter for antineoplastic immunotherapy: Secondary | ICD-10-CM

## 2013-08-12 LAB — COMPREHENSIVE METABOLIC PANEL (CC13)
ALT: 15 U/L (ref 0–55)
AST: 13 U/L (ref 5–34)
Albumin: 3.6 g/dL (ref 3.5–5.0)
Alkaline Phosphatase: 68 U/L (ref 40–150)
Anion Gap: 14 mEq/L — ABNORMAL HIGH (ref 3–11)
BILIRUBIN TOTAL: 0.3 mg/dL (ref 0.20–1.20)
BUN: 86.5 mg/dL — AB (ref 7.0–26.0)
CO2: 25 mEq/L (ref 22–29)
CREATININE: 4.6 mg/dL — AB (ref 0.6–1.1)
Calcium: 9.3 mg/dL (ref 8.4–10.4)
Chloride: 101 mEq/L (ref 98–109)
Glucose: 188 mg/dl — ABNORMAL HIGH (ref 70–140)
Potassium: 4.1 mEq/L (ref 3.5–5.1)
Sodium: 140 mEq/L (ref 136–145)
Total Protein: 7.7 g/dL (ref 6.4–8.3)

## 2013-08-12 LAB — CBC WITH DIFFERENTIAL/PLATELET
BASO%: 0.4 % (ref 0.0–2.0)
Basophils Absolute: 0 10*3/uL (ref 0.0–0.1)
EOS%: 2.3 % (ref 0.0–7.0)
Eosinophils Absolute: 0.1 10*3/uL (ref 0.0–0.5)
HEMATOCRIT: 33.3 % — AB (ref 34.8–46.6)
HGB: 10.8 g/dL — ABNORMAL LOW (ref 11.6–15.9)
LYMPH%: 22.3 % (ref 14.0–49.7)
MCH: 29.1 pg (ref 25.1–34.0)
MCHC: 32.3 g/dL (ref 31.5–36.0)
MCV: 90 fL (ref 79.5–101.0)
MONO#: 0.8 10*3/uL (ref 0.1–0.9)
MONO%: 13.9 % (ref 0.0–14.0)
NEUT#: 3.4 10*3/uL (ref 1.5–6.5)
NEUT%: 61.1 % (ref 38.4–76.8)
PLATELETS: 179 10*3/uL (ref 145–400)
RBC: 3.7 10*6/uL (ref 3.70–5.45)
RDW: 18.4 % — ABNORMAL HIGH (ref 11.2–14.5)
WBC: 5.5 10*3/uL (ref 3.9–10.3)
lymph#: 1.2 10*3/uL (ref 0.9–3.3)

## 2013-08-12 MED ORDER — ONDANSETRON HCL 8 MG PO TABS
ORAL_TABLET | ORAL | Status: AC
  Filled 2013-08-12: qty 1

## 2013-08-12 MED ORDER — ONDANSETRON HCL 8 MG PO TABS
8.0000 mg | ORAL_TABLET | Freq: Once | ORAL | Status: AC
  Administered 2013-08-12: 8 mg via ORAL

## 2013-08-12 MED ORDER — BORTEZOMIB CHEMO SQ INJECTION 3.5 MG (2.5MG/ML)
1.3000 mg/m2 | Freq: Once | INTRAMUSCULAR | Status: AC
  Administered 2013-08-12: 2.75 mg via SUBCUTANEOUS
  Filled 2013-08-12: qty 2.75

## 2013-08-12 NOTE — Patient Instructions (Signed)
[facility] Discharge Instructions for Patients Receiving Chemotherapy  Today you received the following chemotherapy agents: Velcade.  To help prevent nausea and vomiting after your treatment, we encourage you to take your nausea medication as prescribed.   If you develop nausea and vomiting that is not controlled by your nausea medication, call the clinic.   BELOW ARE SYMPTOMS THAT SHOULD BE REPORTED IMMEDIATELY:  *FEVER GREATER THAN 100.5 F  *CHILLS WITH OR WITHOUT FEVER  NAUSEA AND VOMITING THAT IS NOT CONTROLLED WITH YOUR NAUSEA MEDICATION  *UNUSUAL SHORTNESS OF BREATH  *UNUSUAL BRUISING OR BLEEDING  TENDERNESS IN MOUTH AND THROAT WITH OR WITHOUT PRESENCE OF ULCERS  *URINARY PROBLEMS  *BOWEL PROBLEMS  UNUSUAL RASH Items with * indicate a potential emergency and should be followed up as soon as possible.  Feel free to call the clinic you have any questions or concerns. The clinic phone number is [phone].    

## 2013-08-19 ENCOUNTER — Ambulatory Visit (HOSPITAL_BASED_OUTPATIENT_CLINIC_OR_DEPARTMENT_OTHER): Payer: Medicare Other

## 2013-08-19 ENCOUNTER — Other Ambulatory Visit: Payer: Medicare Other

## 2013-08-19 ENCOUNTER — Telehealth: Payer: Self-pay | Admitting: *Deleted

## 2013-08-19 ENCOUNTER — Other Ambulatory Visit (HOSPITAL_BASED_OUTPATIENT_CLINIC_OR_DEPARTMENT_OTHER): Payer: Medicare Other

## 2013-08-19 VITALS — BP 157/81 | HR 67 | Temp 98.1°F | Resp 20

## 2013-08-19 DIAGNOSIS — C9000 Multiple myeloma not having achieved remission: Secondary | ICD-10-CM

## 2013-08-19 DIAGNOSIS — Z5112 Encounter for antineoplastic immunotherapy: Secondary | ICD-10-CM

## 2013-08-19 LAB — CBC WITH DIFFERENTIAL/PLATELET
BASO%: 0.3 % (ref 0.0–2.0)
BASOS ABS: 0 10*3/uL (ref 0.0–0.1)
EOS%: 1.5 % (ref 0.0–7.0)
Eosinophils Absolute: 0.1 10*3/uL (ref 0.0–0.5)
HEMATOCRIT: 33.3 % — AB (ref 34.8–46.6)
HGB: 11 g/dL — ABNORMAL LOW (ref 11.6–15.9)
LYMPH#: 1 10*3/uL (ref 0.9–3.3)
LYMPH%: 14.7 % (ref 14.0–49.7)
MCH: 29.5 pg (ref 25.1–34.0)
MCHC: 33 g/dL (ref 31.5–36.0)
MCV: 89.5 fL (ref 79.5–101.0)
MONO#: 0.6 10*3/uL (ref 0.1–0.9)
MONO%: 8.9 % (ref 0.0–14.0)
NEUT%: 74.6 % (ref 38.4–76.8)
NEUTROS ABS: 5.2 10*3/uL (ref 1.5–6.5)
Platelets: 153 10*3/uL (ref 145–400)
RBC: 3.72 10*6/uL (ref 3.70–5.45)
RDW: 18.2 % — ABNORMAL HIGH (ref 11.2–14.5)
WBC: 7 10*3/uL (ref 3.9–10.3)

## 2013-08-19 LAB — COMPREHENSIVE METABOLIC PANEL (CC13)
ALBUMIN: 3.7 g/dL (ref 3.5–5.0)
ALT: 18 U/L (ref 0–55)
ANION GAP: 15 meq/L — AB (ref 3–11)
AST: 14 U/L (ref 5–34)
Alkaline Phosphatase: 90 U/L (ref 40–150)
BUN: 93.5 mg/dL — AB (ref 7.0–26.0)
CHLORIDE: 105 meq/L (ref 98–109)
CO2: 26 meq/L (ref 22–29)
CREATININE: 5.1 mg/dL — AB (ref 0.6–1.1)
Calcium: 9.4 mg/dL (ref 8.4–10.4)
Glucose: 117 mg/dl (ref 70–140)
POTASSIUM: 4 meq/L (ref 3.5–5.1)
Sodium: 146 mEq/L — ABNORMAL HIGH (ref 136–145)
Total Bilirubin: 0.36 mg/dL (ref 0.20–1.20)
Total Protein: 7.9 g/dL (ref 6.4–8.3)

## 2013-08-19 MED ORDER — ONDANSETRON HCL 8 MG PO TABS
8.0000 mg | ORAL_TABLET | Freq: Once | ORAL | Status: AC
  Administered 2013-08-19: 8 mg via ORAL

## 2013-08-19 MED ORDER — BORTEZOMIB CHEMO SQ INJECTION 3.5 MG (2.5MG/ML)
1.3000 mg/m2 | Freq: Once | INTRAMUSCULAR | Status: AC
  Administered 2013-08-19: 2.75 mg via SUBCUTANEOUS
  Filled 2013-08-19: qty 2.75

## 2013-08-19 MED ORDER — ONDANSETRON HCL 8 MG PO TABS
ORAL_TABLET | ORAL | Status: AC
  Filled 2013-08-19: qty 1

## 2013-08-19 NOTE — Telephone Encounter (Signed)
Cr 5.1, lab work faxed to Dr Lorrene Reid at [facility]

## 2013-08-19 NOTE — Progress Notes (Signed)
Patient states she saw bright red blood on tissue after bowel movement today. Instructed her to call back if this happens again. Note to dr [employer] desk.

## 2013-08-24 ENCOUNTER — Encounter (HOSPITAL_COMMUNITY)
Admission: RE | Admit: 2013-08-24 | Discharge: 2013-08-24 | Disposition: A | Discharge status: Home / Self Care | Payer: Medicare Other | Source: Ambulatory Visit | Attending: Nephrology | Admitting: Nephrology

## 2013-08-24 LAB — POCT HEMOGLOBIN-HEMACUE: Hemoglobin: 10.2 g/dL — ABNORMAL LOW (ref 12.0–15.0)

## 2013-08-24 MED ORDER — DARBEPOETIN ALFA-POLYSORBATE 200 MCG/0.4ML IJ SOLN
200.0000 ug | INTRAMUSCULAR | Status: DC
  Administered 2013-08-24: 200 ug via SUBCUTANEOUS

## 2013-08-24 MED ORDER — DARBEPOETIN ALFA-POLYSORBATE 200 MCG/0.4ML IJ SOLN
INTRAMUSCULAR | Status: AC
  Filled 2013-08-24: qty 0.4

## 2013-08-26 ENCOUNTER — Ambulatory Visit (HOSPITAL_BASED_OUTPATIENT_CLINIC_OR_DEPARTMENT_OTHER): Payer: Medicare Other | Admitting: Physician Assistant

## 2013-08-26 ENCOUNTER — Telehealth: Payer: Self-pay | Admitting: Internal Medicine

## 2013-08-26 ENCOUNTER — Other Ambulatory Visit (HOSPITAL_BASED_OUTPATIENT_CLINIC_OR_DEPARTMENT_OTHER): Payer: Medicare Other

## 2013-08-26 ENCOUNTER — Ambulatory Visit (HOSPITAL_BASED_OUTPATIENT_CLINIC_OR_DEPARTMENT_OTHER): Payer: Medicare Other

## 2013-08-26 ENCOUNTER — Other Ambulatory Visit: Payer: Medicare Other

## 2013-08-26 ENCOUNTER — Encounter: Payer: Self-pay | Admitting: Physician Assistant

## 2013-08-26 VITALS — BP 143/63 | HR 68 | Temp 97.3°F | Resp 18 | Ht 66.0 in | Wt 223.2 lb

## 2013-08-26 DIAGNOSIS — C9000 Multiple myeloma not having achieved remission: Secondary | ICD-10-CM

## 2013-08-26 DIAGNOSIS — R5383 Other fatigue: Secondary | ICD-10-CM

## 2013-08-26 DIAGNOSIS — D631 Anemia in chronic kidney disease: Secondary | ICD-10-CM

## 2013-08-26 DIAGNOSIS — Z5112 Encounter for antineoplastic immunotherapy: Secondary | ICD-10-CM

## 2013-08-26 DIAGNOSIS — R5381 Other malaise: Secondary | ICD-10-CM

## 2013-08-26 DIAGNOSIS — N189 Chronic kidney disease, unspecified: Secondary | ICD-10-CM

## 2013-08-26 DIAGNOSIS — N039 Chronic nephritic syndrome with unspecified morphologic changes: Secondary | ICD-10-CM

## 2013-08-26 LAB — CBC WITH DIFFERENTIAL/PLATELET
BASO%: 0.3 % (ref 0.0–2.0)
Basophils Absolute: 0 10*3/uL (ref 0.0–0.1)
EOS ABS: 0.1 10*3/uL (ref 0.0–0.5)
EOS%: 1 % (ref 0.0–7.0)
HCT: 30.7 % — ABNORMAL LOW (ref 34.8–46.6)
HGB: 10 g/dL — ABNORMAL LOW (ref 11.6–15.9)
LYMPH%: 11 % — AB (ref 14.0–49.7)
MCH: 29.5 pg (ref 25.1–34.0)
MCHC: 32.6 g/dL (ref 31.5–36.0)
MCV: 90.6 fL (ref 79.5–101.0)
MONO#: 0.7 10*3/uL (ref 0.1–0.9)
MONO%: 8.1 % (ref 0.0–14.0)
NEUT%: 79.6 % — ABNORMAL HIGH (ref 38.4–76.8)
NEUTROS ABS: 7.2 10*3/uL — AB (ref 1.5–6.5)
Platelets: 172 10*3/uL (ref 145–400)
RBC: 3.39 10*6/uL — ABNORMAL LOW (ref 3.70–5.45)
RDW: 18.9 % — AB (ref 11.2–14.5)
WBC: 9.1 10*3/uL (ref 3.9–10.3)
lymph#: 1 10*3/uL (ref 0.9–3.3)

## 2013-08-26 LAB — COMPREHENSIVE METABOLIC PANEL (CC13)
ALBUMIN: 3.5 g/dL (ref 3.5–5.0)
ALK PHOS: 76 U/L (ref 40–150)
ALT: 15 U/L (ref 0–55)
AST: 14 U/L (ref 5–34)
Anion Gap: 16 mEq/L — ABNORMAL HIGH (ref 3–11)
BILIRUBIN TOTAL: 0.37 mg/dL (ref 0.20–1.20)
BUN: 93.6 mg/dL — ABNORMAL HIGH (ref 7.0–26.0)
CO2: 26 mEq/L (ref 22–29)
Calcium: 9 mg/dL (ref 8.4–10.4)
Chloride: 102 mEq/L (ref 98–109)
Creatinine: 4.6 mg/dL (ref 0.6–1.1)
GLUCOSE: 183 mg/dL — AB (ref 70–140)
POTASSIUM: 4 meq/L (ref 3.5–5.1)
SODIUM: 144 meq/L (ref 136–145)
TOTAL PROTEIN: 7.3 g/dL (ref 6.4–8.3)

## 2013-08-26 LAB — TSH CHCC: TSH: 3.456 m(IU)/L (ref 0.308–3.960)

## 2013-08-26 MED ORDER — BORTEZOMIB CHEMO SQ INJECTION 3.5 MG (2.5MG/ML)
1.3000 mg/m2 | Freq: Once | INTRAMUSCULAR | Status: AC
  Administered 2013-08-26: 2.75 mg via SUBCUTANEOUS
  Filled 2013-08-26: qty 2.75

## 2013-08-26 MED ORDER — ONDANSETRON HCL 8 MG PO TABS
ORAL_TABLET | ORAL | Status: AC
  Filled 2013-08-26: qty 1

## 2013-08-26 MED ORDER — ONDANSETRON HCL 8 MG PO TABS
8.0000 mg | ORAL_TABLET | Freq: Once | ORAL | Status: AC
  Administered 2013-08-26: 8 mg via ORAL

## 2013-08-26 NOTE — Patient Instructions (Signed)
[facility] Discharge Instructions for Patients Receiving Chemotherapy  Today you received the following chemotherapy agents: Velcade.   To help prevent nausea and vomiting after your treatment, we encourage you to take your nausea medication, Zofran. Take it every eight hours as needed.    If you develop nausea and vomiting that is not controlled by your nausea medication, call the clinic.   BELOW ARE SYMPTOMS THAT SHOULD BE REPORTED IMMEDIATELY:  *FEVER GREATER THAN 100.5 F  *CHILLS WITH OR WITHOUT FEVER  NAUSEA AND VOMITING THAT IS NOT CONTROLLED WITH YOUR NAUSEA MEDICATION  *UNUSUAL SHORTNESS OF BREATH  *UNUSUAL BRUISING OR BLEEDING  TENDERNESS IN MOUTH AND THROAT WITH OR WITHOUT PRESENCE OF ULCERS  *URINARY PROBLEMS  *BOWEL PROBLEMS  UNUSUAL RASH Items with * indicate a potential emergency and should be followed up as soon as possible.  Feel free to call the clinic should you have any questions or concerns. The clinic phone number is [phone].

## 2013-08-26 NOTE — Progress Notes (Addendum)
[facility] [phone]   [phone]  SHARED VISIT PROGRESS NOTE  [name], MD [place] [address]  DIAGNOSIS:  #1 plasma cell dyscrasia, multiple myeloma.  #2 anemia of chronic disease secondary to chronic kidney disease.   CURRENT THERAPY: Treatment with single agent subcutaneous Velcade 1.3 mg/m2 on a weekly basis with Decadron 40 mg by mouth weekly, status post 6 cycles. First cycle to be given 07/15/2013   INTERVAL HISTORY: [name] 80 y.o. female returns to the clinic today for followup visit unaccompanied.The patient is feeling fine today with no specific complaints except for increasing fatigue, bilateral side pain below the rib cage and continued other vague complaints. She tolerated the first 6 weeks of her treatment with subcutaneous Velcade and Decadron fairly well. She denied having any significant nausea or vomiting, no fever or chills. She denied having any significant weight loss. She reports night sweats occurring 2-3 times per week. The patient denied having any peripheral neuropathy.  MEDICAL HISTORY: Past Medical History  Diagnosis Date  . Renal failure   . Hypertension   . Diabetes mellitus   . Arthritis   . Asthma   . GERD (gastroesophageal reflux disease)   . Neuropathy in diabetes     Hx: of  . COPD (chronic obstructive pulmonary disease)   . Anemia     ALLERGIES:  is allergic to penicillins cross reactors.  MEDICATIONS:  Current Outpatient Prescriptions  Medication Sig Dispense Refill  . acyclovir (ZOVIRAX) 400 MG tablet Take 1 tablet (400 mg total) by mouth 2 (two) times daily.  60 tablet  2  . allopurinol (ZYLOPRIM) 100 MG tablet Take 100 mg by mouth daily with breakfast.       . amLODipine (NORVASC) 5 MG tablet Take 5 mg by mouth daily with breakfast.       . b complex-vitamin c-folic acid (NEPHRO-VITE) 0.8 MG TABS tablet Take 1 tablet by mouth daily.      .  calcitRIOL (ROCALTROL) 0.5 MCG capsule Take 0.5 mcg by mouth daily with breakfast.       . dexamethasone (DECADRON) 4 MG tablet 10 tablet by mouth every week starting with the first dose of the chemotherapy.  80 tablet  1  . ferrous sulfate 325 (65 FE) MG tablet Take 325 mg by mouth daily.      . furosemide (LASIX) 80 MG tablet Take 80 mg by mouth daily.      Marland Kitchen gabapentin (NEURONTIN) 100 MG capsule Take 100 mg by mouth 3 (three) times daily.      Marland Kitchen glimepiride (AMARYL) 1 MG tablet Take 1 mg by mouth daily with breakfast.       . HYDROcodone-acetaminophen (NORCO) 7.5-325 MG per tablet Take 1 tablet by mouth every 6 (six) hours as needed for moderate pain.      . ONE TOUCH ULTRA TEST test strip       . Oxycodone HCl 20 MG TABS 20 mg.      . pantoprazole (PROTONIX) 40 MG tablet Take 40 mg by mouth 2 (two) times daily.       . saxagliptin HCl (ONGLYZA) 2.5 MG TABS tablet Take 2.5 mg by mouth daily with breakfast.       . ondansetron (ZOFRAN) 8 MG tablet Take 1 tablet (8 mg total) by mouth every 8 (eight) hours as needed for nausea or vomiting.  20 tablet  1  . oxyCODONE-acetaminophen (PERCOCET/ROXICET) 5-325  MG per tablet       . prochlorperazine (COMPAZINE) 10 MG tablet Take 1 tablet (10 mg total) by mouth every 6 (six) hours as needed for nausea or vomiting.  30 tablet  0   No current facility-administered medications for this visit.    SURGICAL HISTORY:  Past Surgical History  Procedure Laterality Date  . Lobectomy    . Cholecystectomy    . Appendectomy    . Tubal ligation    . Fistula left arm Left 2011    never used  . Colonoscopy      Hx: of  . Tummy tuck      Hx: of  . Abdominal hysterectomy    . Av fistula placement Left 05/15/2013    Procedure: INSERTION OF ARTERIOVENOUS (AV) GORE-TEX GRAFT ARM-LEFT UPPER ARM;  Surgeon: Mal Misty, MD;  Location: [facility];  Service: Vascular;  Laterality: Left;    REVIEW OF SYSTEMS:  Constitutional: positive for fatigue and malaise Eyes:  negative Ears, nose, mouth, throat, and face: negative Respiratory: negative Cardiovascular: negative Gastrointestinal: negative Genitourinary:negative Integument/breast: negative Hematologic/lymphatic: negative Musculoskeletal:positive for myalgias Neurological: negative Behavioral/Psych: positive for fatigue and Flat affect Endocrine: negative Allergic/Immunologic: negative   PHYSICAL EXAMINATION: General appearance: alert, cooperative, fatigued and no distress Head: Normocephalic, without obvious abnormality, atraumatic Neck: no adenopathy Lymph nodes: Cervical, supraclavicular, and axillary nodes normal. Resp: clear to auscultation bilaterally Back: symmetric, no curvature. ROM normal. No CVA tenderness. Cardio: regular rate and rhythm, S1, S2 normal, no murmur, click, rub or gallop GI: soft, non-tender; bowel sounds normal; no masses,  no organomegaly Extremities: extremities normal, atraumatic, no cyanosis or edema Neurologic: Alert and oriented X 3, normal strength and tone. Normal symmetric reflexes. Normal coordination and gait  ECOG PERFORMANCE STATUS: 2 - Symptomatic, <50% confined to bed  Blood pressure 143/63, pulse 68, temperature 97.3 F (36.3 C), temperature source Oral, resp. rate 18, height $RemoveBe'5\' 6"'[id number]$  (1.676 m), weight 223 lb 3.2 oz (101.243 kg).  LABORATORY DATA: Lab Results  Component Value Date   WBC 9.1 08/26/2013   HGB 10.0* 08/26/2013   HCT 30.7* 08/26/2013   MCV 90.6 08/26/2013   PLT 172 08/26/2013      Chemistry      Component Value Date/Time   NA 144 08/26/2013 0858   NA 143 06/15/2013 0951   K 4.0 08/26/2013 0858   K 4.2 06/15/2013 0951   CL 105 06/15/2013 0951   CL 98 09/17/2012 1242   CO2 26 08/26/2013 0858   CO2 22 06/15/2013 0951   BUN 93.6* 08/26/2013 0858   BUN 80* 06/15/2013 0951   CREATININE 4.6* 08/26/2013 0858   CREATININE 4.75* 06/15/2013 0951      Component Value Date/Time   CALCIUM 9.0 08/26/2013 0858   CALCIUM 8.7 06/15/2013 0951   CALCIUM  8.6 12/02/2012 0933   ALKPHOS 76 08/26/2013 0858   ALKPHOS 81 03/26/2012 1643   AST 14 08/26/2013 0858   AST 23 03/26/2012 1643   ALT 15 08/26/2013 0858   ALT 14 03/26/2012 1643   BILITOT 0.37 08/26/2013 0858   BILITOT 0.2* 03/26/2012 1643       ASSESSMENT: This is a very pleasant 80 years old [place] American female with plasma cell dyscrasia and currently on treatment with weekly subcutaneous Velcade and Decadron status post 6 weekly doses and tolerating it fairly well. Patient was discussed with also seen by Dr. Julien Nordmann. To further evaluate her complaints of increased fatigue, we will obtain a TSH. Should this  be abnormal we will refer the results to her primary care physician for management. She will continue with her current treatment with Velcade and Decadron as scheduled. We'll plan to do repeat protein studies in 2 weeks. She will followup with Dr. Julien Nordmann in 3 weeks for another symptom management visit and to discuss the results of the repeat protein studies.. The patient has chronic renal failure and is followed by [facility].  She was advised to call immediately if she has any concerning symptoms in the interval.  Carlton Adam, PA-C  ADDENDUM:  Hematology/Oncology Attending:  I had a face to face encounter with the patient. I recommended her care plan.  This is a 80 years old African American female was recently diagnosed with plasma cell dyscrasia and currently undergoing systemic chemotherapy with weekly Velcade and Decadron status post 6 cycles The patient is tolerating her treatment with Velcade fairly well except for the injection site tenderness.  She has chronic renal insufficiency and is expected to start hemodialysis in the near future. I recommended for the patient to continue her current treatment with Velcade and Decadron as scheduled. She would come back for followup visit in 3 weeks after repeating myeloma panel for evaluation of her disease. She was  advised to call immediately if she has any concerning symptoms in the interval.  Disclaimer: This note was dictated with voice recognition software. Similar sounding words can inadvertently be transcribed and may not be corrected upon review. Eilleen Kempf., MD 08/29/2013

## 2013-08-26 NOTE — Telephone Encounter (Signed)
gv and printed aptp sched and avs for pt for April....sed added tx....sent pt to lab

## 2013-08-27 NOTE — Patient Instructions (Signed)
Continue your labs and chemotherapy as scheduled Followup with Dr. Julien Nordmann in 3 weeks with repeat protein studies to reevaluate your disease

## 2013-09-02 ENCOUNTER — Other Ambulatory Visit (HOSPITAL_BASED_OUTPATIENT_CLINIC_OR_DEPARTMENT_OTHER): Payer: Medicare Other

## 2013-09-02 ENCOUNTER — Ambulatory Visit (HOSPITAL_BASED_OUTPATIENT_CLINIC_OR_DEPARTMENT_OTHER): Payer: Medicare Other

## 2013-09-02 VITALS — BP 133/64 | HR 69 | Temp 97.8°F

## 2013-09-02 DIAGNOSIS — Z5112 Encounter for antineoplastic immunotherapy: Secondary | ICD-10-CM

## 2013-09-02 DIAGNOSIS — C9000 Multiple myeloma not having achieved remission: Secondary | ICD-10-CM

## 2013-09-02 LAB — COMPREHENSIVE METABOLIC PANEL (CC13)
ALT: 13 U/L (ref 0–55)
AST: 11 U/L (ref 5–34)
Albumin: 3.4 g/dL — ABNORMAL LOW (ref 3.5–5.0)
Alkaline Phosphatase: 74 U/L (ref 40–150)
Anion Gap: 13 meq/L — ABNORMAL HIGH (ref 3–11)
BUN: 97.5 mg/dL — ABNORMAL HIGH (ref 7.0–26.0)
CO2: 26 meq/L (ref 22–29)
Calcium: 9.1 mg/dL (ref 8.4–10.4)
Chloride: 103 meq/L (ref 98–109)
Creatinine: 4.3 mg/dL (ref 0.6–1.1)
Glucose: 193 mg/dL — ABNORMAL HIGH (ref 70–140)
Potassium: 3.7 meq/L (ref 3.5–5.1)
Sodium: 142 meq/L (ref 136–145)
Total Bilirubin: 0.38 mg/dL (ref 0.20–1.20)
Total Protein: 7.2 g/dL (ref 6.4–8.3)

## 2013-09-02 LAB — CBC WITH DIFFERENTIAL/PLATELET
BASO%: 0.2 % (ref 0.0–2.0)
Basophils Absolute: 0 10e3/uL (ref 0.0–0.1)
EOS%: 1.6 % (ref 0.0–7.0)
Eosinophils Absolute: 0.1 10e3/uL (ref 0.0–0.5)
HCT: 30.9 % — ABNORMAL LOW (ref 34.8–46.6)
HGB: 10 g/dL — ABNORMAL LOW (ref 11.6–15.9)
LYMPH%: 12.8 % — ABNORMAL LOW (ref 14.0–49.7)
MCH: 29.6 pg (ref 25.1–34.0)
MCHC: 32.4 g/dL (ref 31.5–36.0)
MCV: 91.3 fL (ref 79.5–101.0)
MONO#: 0.7 10e3/uL (ref 0.1–0.9)
MONO%: 8.5 % (ref 0.0–14.0)
NEUT#: 6 10e3/uL (ref 1.5–6.5)
NEUT%: 76.9 % — ABNORMAL HIGH (ref 38.4–76.8)
Platelets: 196 10e3/uL (ref 145–400)
RBC: 3.39 10e6/uL — ABNORMAL LOW (ref 3.70–5.45)
RDW: 20.8 % — ABNORMAL HIGH (ref 11.2–14.5)
WBC: 7.8 10e3/uL (ref 3.9–10.3)
lymph#: 1 10e3/uL (ref 0.9–3.3)

## 2013-09-02 MED ORDER — ONDANSETRON HCL 8 MG PO TABS
ORAL_TABLET | ORAL | Status: AC
  Filled 2013-09-02: qty 1

## 2013-09-02 MED ORDER — ONDANSETRON HCL 8 MG PO TABS
8.0000 mg | ORAL_TABLET | Freq: Once | ORAL | Status: AC
  Administered 2013-09-02: 8 mg via ORAL

## 2013-09-02 MED ORDER — BORTEZOMIB CHEMO SQ INJECTION 3.5 MG (2.5MG/ML)
1.3000 mg/m2 | Freq: Once | INTRAMUSCULAR | Status: AC
  Administered 2013-09-02: 2.75 mg via SUBCUTANEOUS
  Filled 2013-09-02: qty 2.75

## 2013-09-02 NOTE — Progress Notes (Signed)
Creatinine 4.1  Ok to treat per [employer].  Treated 2 weeks ago creatinine 5.1.

## 2013-09-02 NOTE — Patient Instructions (Signed)
[facility] Discharge Instructions for Patients Receiving Chemotherapy  Today you received the following chemotherapy agents velcade   To help prevent nausea and vomiting after your treatment, we encourage you to take your nausea medication as directed  If you develop nausea and vomiting that is not controlled by your nausea medication, call the clinic.   BELOW ARE SYMPTOMS THAT SHOULD BE REPORTED IMMEDIATELY:  *FEVER GREATER THAN 100.5 F  *CHILLS WITH OR WITHOUT FEVER  NAUSEA AND VOMITING THAT IS NOT CONTROLLED WITH YOUR NAUSEA MEDICATION  *UNUSUAL SHORTNESS OF BREATH  *UNUSUAL BRUISING OR BLEEDING  TENDERNESS IN MOUTH AND THROAT WITH OR WITHOUT PRESENCE OF ULCERS  *URINARY PROBLEMS  *BOWEL PROBLEMS  UNUSUAL RASH Items with * indicate a potential emergency and should be followed up as soon as possible.  Feel free to call the clinic you have any questions or concerns. The clinic phone number is [phone].  

## 2013-09-09 ENCOUNTER — Other Ambulatory Visit: Payer: Self-pay | Admitting: Internal Medicine

## 2013-09-09 ENCOUNTER — Ambulatory Visit (HOSPITAL_BASED_OUTPATIENT_CLINIC_OR_DEPARTMENT_OTHER): Payer: Medicare Other

## 2013-09-09 ENCOUNTER — Other Ambulatory Visit (HOSPITAL_BASED_OUTPATIENT_CLINIC_OR_DEPARTMENT_OTHER): Payer: Medicare Other

## 2013-09-09 ENCOUNTER — Other Ambulatory Visit: Payer: Self-pay

## 2013-09-09 VITALS — BP 163/48 | HR 76 | Temp 97.6°F

## 2013-09-09 DIAGNOSIS — Z5112 Encounter for antineoplastic immunotherapy: Secondary | ICD-10-CM

## 2013-09-09 DIAGNOSIS — C9000 Multiple myeloma not having achieved remission: Secondary | ICD-10-CM

## 2013-09-09 LAB — COMPREHENSIVE METABOLIC PANEL (CC13)
ALT: 13 U/L (ref 0–55)
AST: 13 U/L (ref 5–34)
Albumin: 3.5 g/dL (ref 3.5–5.0)
Alkaline Phosphatase: 70 U/L (ref 40–150)
Anion Gap: 13 mEq/L — ABNORMAL HIGH (ref 3–11)
BILIRUBIN TOTAL: 0.5 mg/dL (ref 0.20–1.20)
BUN: 117.3 mg/dL — ABNORMAL HIGH (ref 7.0–26.0)
CO2: 26 meq/L (ref 22–29)
CREATININE: 4.9 mg/dL — AB (ref 0.6–1.1)
Calcium: 9.1 mg/dL (ref 8.4–10.4)
Chloride: 102 mEq/L (ref 98–109)
Glucose: 186 mg/dl — ABNORMAL HIGH (ref 70–140)
Potassium: 3.9 mEq/L (ref 3.5–5.1)
Sodium: 142 mEq/L (ref 136–145)
Total Protein: 7.3 g/dL (ref 6.4–8.3)

## 2013-09-09 LAB — CBC WITH DIFFERENTIAL/PLATELET
BASO%: 0.3 % (ref 0.0–2.0)
BASOS ABS: 0 10*3/uL (ref 0.0–0.1)
EOS%: 1.4 % (ref 0.0–7.0)
Eosinophils Absolute: 0.1 10*3/uL (ref 0.0–0.5)
HEMATOCRIT: 30.3 % — AB (ref 34.8–46.6)
HEMOGLOBIN: 9.9 g/dL — AB (ref 11.6–15.9)
LYMPH%: 11.6 % — ABNORMAL LOW (ref 14.0–49.7)
MCH: 29.8 pg (ref 25.1–34.0)
MCHC: 32.7 g/dL (ref 31.5–36.0)
MCV: 91.1 fL (ref 79.5–101.0)
MONO#: 0.5 10*3/uL (ref 0.1–0.9)
MONO%: 7 % (ref 0.0–14.0)
NEUT%: 79.7 % — AB (ref 38.4–76.8)
NEUTROS ABS: 5.9 10*3/uL (ref 1.5–6.5)
PLATELETS: 181 10*3/uL (ref 145–400)
RBC: 3.32 10*6/uL — ABNORMAL LOW (ref 3.70–5.45)
RDW: 21.3 % — ABNORMAL HIGH (ref 11.2–14.5)
WBC: 7.5 10*3/uL (ref 3.9–10.3)
lymph#: 0.9 10*3/uL (ref 0.9–3.3)

## 2013-09-09 LAB — LACTATE DEHYDROGENASE (CC13): LDH: 244 U/L (ref 125–245)

## 2013-09-09 MED ORDER — ONDANSETRON HCL 8 MG PO TABS
8.0000 mg | ORAL_TABLET | Freq: Once | ORAL | Status: AC
  Administered 2013-09-09: 8 mg via ORAL

## 2013-09-09 MED ORDER — BORTEZOMIB CHEMO SQ INJECTION 3.5 MG (2.5MG/ML)
1.3000 mg/m2 | Freq: Once | INTRAMUSCULAR | Status: AC
  Administered 2013-09-09: 2.75 mg via SUBCUTANEOUS
  Filled 2013-09-09: qty 2.75

## 2013-09-09 MED ORDER — ONDANSETRON HCL 8 MG PO TABS
ORAL_TABLET | ORAL | Status: AC
  Filled 2013-09-09: qty 1

## 2013-09-09 NOTE — Progress Notes (Signed)
Ok to treat per Dr. Mohamed. 

## 2013-09-09 NOTE — Patient Instructions (Signed)
[facility] Discharge Instructions for Patients Receiving Chemotherapy  Today you received the following chemotherapy agents: Velcade.  To help prevent nausea and vomiting after your treatment, we encourage you to take your nausea medication as prescribed.   If you develop nausea and vomiting that is not controlled by your nausea medication, call the clinic.   BELOW ARE SYMPTOMS THAT SHOULD BE REPORTED IMMEDIATELY:  *FEVER GREATER THAN 100.5 F  *CHILLS WITH OR WITHOUT FEVER  NAUSEA AND VOMITING THAT IS NOT CONTROLLED WITH YOUR NAUSEA MEDICATION  *UNUSUAL SHORTNESS OF BREATH  *UNUSUAL BRUISING OR BLEEDING  TENDERNESS IN MOUTH AND THROAT WITH OR WITHOUT PRESENCE OF ULCERS  *URINARY PROBLEMS  *BOWEL PROBLEMS  UNUSUAL RASH Items with * indicate a potential emergency and should be followed up as soon as possible.  Feel free to call the clinic you have any questions or concerns. The clinic phone number is [phone].    

## 2013-09-11 LAB — KAPPA/LAMBDA LIGHT CHAINS
KAPPA LAMBDA RATIO: 0.53 (ref 0.26–1.65)
Kappa free light chain: 6.35 mg/dL — ABNORMAL HIGH (ref 0.33–1.94)
LAMBDA FREE LGHT CHN: 12 mg/dL — AB (ref 0.57–2.63)

## 2013-09-11 LAB — IGG, IGA, IGM
IGA: 108 mg/dL (ref 69–380)
IGG (IMMUNOGLOBIN G), SERUM: 1310 mg/dL (ref 690–1700)
IGM, SERUM: 77 mg/dL (ref 52–322)

## 2013-09-11 LAB — BETA 2 MICROGLOBULIN, SERUM: Beta-2 Microglobulin: 16.1 mg/L — ABNORMAL HIGH (ref <=2.51)

## 2013-09-14 ENCOUNTER — Encounter (HOSPITAL_COMMUNITY): Payer: Medicare Other

## 2013-09-14 ENCOUNTER — Ambulatory Visit: Payer: Medicare Other | Admitting: Surgery

## 2013-09-14 ENCOUNTER — Encounter (HOSPITAL_COMMUNITY)
Admission: RE | Admit: 2013-09-14 | Discharge: 2013-09-14 | Disposition: A | Discharge status: Home / Self Care | Payer: Medicare Other | Source: Ambulatory Visit | Attending: Nephrology | Admitting: Nephrology

## 2013-09-14 DIAGNOSIS — N184 Chronic kidney disease, stage 4 (severe): Secondary | ICD-10-CM | POA: Insufficient documentation

## 2013-09-14 DIAGNOSIS — D638 Anemia in other chronic diseases classified elsewhere: Secondary | ICD-10-CM | POA: Insufficient documentation

## 2013-09-14 LAB — IRON AND TIBC
IRON: 35 ug/dL — AB (ref 42–135)
Saturation Ratios: 13 % — ABNORMAL LOW (ref 20–55)
TIBC: 272 ug/dL (ref 250–470)
UIBC: 237 ug/dL (ref 125–400)

## 2013-09-14 LAB — RENAL FUNCTION PANEL
ALBUMIN: 3.6 g/dL (ref 3.5–5.2)
BUN: 99 mg/dL — ABNORMAL HIGH (ref 6–23)
CALCIUM: 9.1 mg/dL (ref 8.4–10.5)
CO2: 27 mEq/L (ref 19–32)
Chloride: 98 mEq/L (ref 96–112)
Creatinine, Ser: 4.4 mg/dL — ABNORMAL HIGH (ref 0.50–1.10)
GFR calc Af Amer: 10 mL/min — ABNORMAL LOW (ref >90)
GFR, EST NON AFRICAN AMERICAN: 9 mL/min — AB (ref >90)
Glucose, Bld: 197 mg/dL — ABNORMAL HIGH (ref 70–99)
POTASSIUM: 4.1 meq/L (ref 3.7–5.3)
Phosphorus: 5.1 mg/dL — ABNORMAL HIGH (ref 2.3–4.6)
Sodium: 142 mEq/L (ref 137–147)

## 2013-09-14 LAB — POCT HEMOGLOBIN-HEMACUE: Hemoglobin: 10.3 g/dL — ABNORMAL LOW (ref 12.0–15.0)

## 2013-09-14 LAB — FERRITIN: Ferritin: 1516 ng/mL — ABNORMAL HIGH (ref 10–291)

## 2013-09-14 MED ORDER — DARBEPOETIN ALFA-POLYSORBATE 200 MCG/0.4ML IJ SOLN
INTRAMUSCULAR | Status: AC
  Filled 2013-09-14: qty 0.4

## 2013-09-14 MED ORDER — DARBEPOETIN ALFA-POLYSORBATE 200 MCG/0.4ML IJ SOLN
200.0000 ug | INTRAMUSCULAR | Status: DC
  Administered 2013-09-14: 200 ug via SUBCUTANEOUS

## 2013-09-16 ENCOUNTER — Telehealth: Payer: Self-pay | Admitting: Internal Medicine

## 2013-09-16 ENCOUNTER — Other Ambulatory Visit (HOSPITAL_BASED_OUTPATIENT_CLINIC_OR_DEPARTMENT_OTHER): Payer: Medicare Other

## 2013-09-16 ENCOUNTER — Ambulatory Visit (HOSPITAL_BASED_OUTPATIENT_CLINIC_OR_DEPARTMENT_OTHER): Payer: Medicare Other | Admitting: Physician Assistant

## 2013-09-16 ENCOUNTER — Encounter: Payer: Self-pay | Admitting: Physician Assistant

## 2013-09-16 ENCOUNTER — Ambulatory Visit (HOSPITAL_BASED_OUTPATIENT_CLINIC_OR_DEPARTMENT_OTHER): Payer: Medicare Other

## 2013-09-16 VITALS — BP 151/52 | HR 77 | Temp 97.4°F | Resp 19 | Ht 66.0 in | Wt 221.6 lb

## 2013-09-16 DIAGNOSIS — N189 Chronic kidney disease, unspecified: Secondary | ICD-10-CM

## 2013-09-16 DIAGNOSIS — B370 Candidal stomatitis: Secondary | ICD-10-CM

## 2013-09-16 DIAGNOSIS — C9000 Multiple myeloma not having achieved remission: Secondary | ICD-10-CM

## 2013-09-16 DIAGNOSIS — Z5112 Encounter for antineoplastic immunotherapy: Secondary | ICD-10-CM

## 2013-09-16 LAB — CBC WITH DIFFERENTIAL/PLATELET
BASO%: 0.5 % (ref 0.0–2.0)
Basophils Absolute: 0 10*3/uL (ref 0.0–0.1)
EOS ABS: 0.1 10*3/uL (ref 0.0–0.5)
EOS%: 0.8 % (ref 0.0–7.0)
HCT: 30.5 % — ABNORMAL LOW (ref 34.8–46.6)
HGB: 9.9 g/dL — ABNORMAL LOW (ref 11.6–15.9)
LYMPH%: 6.7 % — ABNORMAL LOW (ref 14.0–49.7)
MCH: 29.6 pg (ref 25.1–34.0)
MCHC: 32.6 g/dL (ref 31.5–36.0)
MCV: 90.7 fL (ref 79.5–101.0)
MONO#: 0.7 10*3/uL (ref 0.1–0.9)
MONO%: 6.7 % (ref 0.0–14.0)
NEUT#: 8.4 10*3/uL — ABNORMAL HIGH (ref 1.5–6.5)
NEUT%: 85.3 % — ABNORMAL HIGH (ref 38.4–76.8)
PLATELETS: 186 10*3/uL (ref 145–400)
RBC: 3.36 10*6/uL — AB (ref 3.70–5.45)
RDW: 21.1 % — ABNORMAL HIGH (ref 11.2–14.5)
WBC: 9.9 10*3/uL (ref 3.9–10.3)
lymph#: 0.7 10*3/uL — ABNORMAL LOW (ref 0.9–3.3)

## 2013-09-16 LAB — COMPREHENSIVE METABOLIC PANEL (CC13)
ALK PHOS: 60 U/L (ref 40–150)
ALT: 10 U/L (ref 0–55)
AST: 13 U/L (ref 5–34)
Albumin: 3.3 g/dL — ABNORMAL LOW (ref 3.5–5.0)
Anion Gap: 13 mEq/L — ABNORMAL HIGH (ref 3–11)
BILIRUBIN TOTAL: 0.42 mg/dL (ref 0.20–1.20)
BUN: 89.9 mg/dL — ABNORMAL HIGH (ref 7.0–26.0)
CO2: 29 mEq/L (ref 22–29)
Calcium: 9.3 mg/dL (ref 8.4–10.4)
Chloride: 97 mEq/L — ABNORMAL LOW (ref 98–109)
Creatinine: 4.8 mg/dL (ref 0.6–1.1)
GLUCOSE: 273 mg/dL — AB (ref 70–140)
Potassium: 3.8 mEq/L (ref 3.5–5.1)
SODIUM: 139 meq/L (ref 136–145)
TOTAL PROTEIN: 7.3 g/dL (ref 6.4–8.3)

## 2013-09-16 MED ORDER — ONDANSETRON HCL 8 MG PO TABS
8.0000 mg | ORAL_TABLET | Freq: Once | ORAL | Status: AC
  Administered 2013-09-16: 8 mg via ORAL

## 2013-09-16 MED ORDER — BORTEZOMIB CHEMO SQ INJECTION 3.5 MG (2.5MG/ML)
1.3000 mg/m2 | Freq: Once | INTRAMUSCULAR | Status: AC
  Administered 2013-09-16: 2.75 mg via SUBCUTANEOUS
  Filled 2013-09-16: qty 2.75

## 2013-09-16 MED ORDER — NYSTATIN 100000 UNIT/ML MT SUSP: OROMUCOSAL | Status: DC

## 2013-09-16 MED ORDER — ONDANSETRON HCL 8 MG PO TABS
ORAL_TABLET | ORAL | Status: AC
  Filled 2013-09-16: qty 1

## 2013-09-16 NOTE — Progress Notes (Addendum)
[facility] [phone]) [phone]   [phone]  SHARED VISIT PROGRESS NOTE  [name], MD [place] [address]  DIAGNOSIS:  #1 plasma cell dyscrasia, multiple myeloma.  #2 anemia of chronic disease secondary to chronic kidney disease.   CURRENT THERAPY: Treatment with single agent subcutaneous Velcade 1.3 mg/m2 on a weekly basis with Decadron 40 mg by mouth weekly, status post 9 cycles. First cycle to be given 07/15/2013   INTERVAL HISTORY: [name] 80 y.o. female returns to the clinic today for followup visit accompanied by her daughter.The patient is feeling fine today with no specific complaints.  She tolerated the first 9 weeks of her treatment with subcutaneous Velcade and Decadron fairly well. She denied having any significant nausea or vomiting, no fever or chills. She denied having any significant weight loss. She reports night sweats occurring 2-3 times per week. The patient denied having any peripheral neuropathy. She recently had repeat protein studies and is here to discuss the results.  MEDICAL HISTORY: Past Medical History  Diagnosis Date  . Renal failure   . Hypertension   . Diabetes mellitus   . Arthritis   . Asthma   . GERD (gastroesophageal reflux disease)   . Neuropathy in diabetes     Hx: of  . COPD (chronic obstructive pulmonary disease)   . Anemia     ALLERGIES:  is allergic to penicillins cross reactors.  MEDICATIONS:  Current Outpatient Prescriptions  Medication Sig Dispense Refill  . acyclovir (ZOVIRAX) 400 MG tablet Take 1 tablet (400 mg total) by mouth 2 (two) times daily.  60 tablet  2  . allopurinol (ZYLOPRIM) 100 MG tablet Take 100 mg by mouth daily with breakfast.       . amLODipine (NORVASC) 5 MG tablet Take 5 mg by mouth daily with breakfast.       . b complex-vitamin c-folic acid (NEPHRO-VITE) 0.8 MG TABS tablet Take 1 tablet by mouth daily.      . calcitRIOL  (ROCALTROL) 0.5 MCG capsule Take 0.5 mcg by mouth daily with breakfast.       . dexamethasone (DECADRON) 4 MG tablet 10 tablet by mouth every week starting with the first dose of the chemotherapy.  80 tablet  1  . ferrous sulfate 325 (65 FE) MG tablet Take 325 mg by mouth daily.      . furosemide (LASIX) 80 MG tablet Take 80 mg by mouth daily.      Marland Kitchen gabapentin (NEURONTIN) 100 MG capsule Take 100 mg by mouth 3 (three) times daily.      Marland Kitchen glimepiride (AMARYL) 1 MG tablet Take 1 mg by mouth daily with breakfast.       . HYDROcodone-acetaminophen (NORCO) 7.5-325 MG per tablet Take 1 tablet by mouth every 6 (six) hours as needed for moderate pain.      Marland Kitchen ondansetron (ZOFRAN) 8 MG tablet Take 1 tablet (8 mg total) by mouth every 8 (eight) hours as needed for nausea or vomiting.  20 tablet  1  . ONE TOUCH ULTRA TEST test strip       . Oxycodone HCl 20 MG TABS 20 mg.      . pantoprazole (PROTONIX) 40 MG tablet Take 40 mg by mouth 2 (two) times daily.       . prochlorperazine (COMPAZINE) 10 MG tablet Take 1 tablet (10 mg total) by mouth every 6 (six) hours as needed for nausea or vomiting.  30 tablet  0  . saxagliptin HCl (ONGLYZA) 2.5 MG TABS tablet Take 2.5 mg by mouth daily with breakfast.       . nystatin (MYCOSTATIN) 100000 UNIT/ML suspension Take 5 mLs (500,000 Units total) swish and spit, four times daily ( every 6 hours)  120 mL  0   No current facility-administered medications for this visit.    SURGICAL HISTORY:  Past Surgical History  Procedure Laterality Date  . Lobectomy    . Cholecystectomy    . Appendectomy    . Tubal ligation    . Fistula left arm Left 2011    never used  . Colonoscopy      Hx: of  . Tummy tuck      Hx: of  . Abdominal hysterectomy    . Av fistula placement Left 05/15/2013    Procedure: INSERTION OF ARTERIOVENOUS (AV) GORE-TEX GRAFT ARM-LEFT UPPER ARM;  Surgeon: Mal Misty, MD;  Location: [facility];  Service: Vascular;  Laterality: Left;    REVIEW OF  SYSTEMS:  Constitutional: positive for fatigue and malaise Eyes: negative Ears, nose, mouth, throat, and face: negative Respiratory: negative Cardiovascular: negative Gastrointestinal: negative Genitourinary:negative Integument/breast: negative Hematologic/lymphatic: negative Musculoskeletal:positive for myalgias Neurological: negative Behavioral/Psych: positive for fatigue and Flat affect Endocrine: negative Allergic/Immunologic: negative   PHYSICAL EXAMINATION: General appearance: alert, cooperative, fatigued and no distress Head: Normocephalic, without obvious abnormality, atraumatic Neck: no adenopathy Lymph nodes: Cervical, supraclavicular, and axillary nodes normal. Resp: clear to auscultation bilaterally Back: symmetric, no curvature. ROM normal. No CVA tenderness. Cardio: regular rate and rhythm, S1, S2 normal, no murmur, click, rub or gallop GI: soft, non-tender; bowel sounds normal; no masses,  no organomegaly Extremities: extremities normal, atraumatic, no cyanosis or edema Neurologic: Alert and oriented X 3, normal strength and tone. Normal symmetric reflexes. Normal coordination and gait  ECOG PERFORMANCE STATUS: 2 - Symptomatic, <50% confined to bed  Blood pressure 151/52, pulse 77, temperature 97.4 F (36.3 C), temperature source Oral, resp. rate 19, height _0  (1.676 m), weight 221 lb 9.6 oz (100.517 kg).  LABORATORY DATA: Lab Results  Component Value Date   WBC 9.9 09/16/2013   HGB 9.9* 09/16/2013   HCT 30.5* 09/16/2013   MCV 90.7 09/16/2013   PLT 186 09/16/2013      Chemistry      Component Value Date/Time   NA 139 09/16/2013 0908   NA 142 09/14/2013 0906   K 3.8 09/16/2013 0908   K 4.1 09/14/2013 0906   CL 98 09/14/2013 0906   CL 98 09/17/2012 1242   CO2 29 09/16/2013 0908   CO2 27 09/14/2013 0906   BUN 89.9* 09/16/2013 0908   BUN 99* 09/14/2013 0906   CREATININE 4.8* 09/16/2013 0908   CREATININE 4.40* 09/14/2013 0906      Component Value Date/Time    CALCIUM 9.3 09/16/2013 0908   CALCIUM 9.1 09/14/2013 0906   CALCIUM 8.6 12/02/2012 0933   ALKPHOS 60 09/16/2013 0908   ALKPHOS 81 03/26/2012 1643   AST 13 09/16/2013 0908   AST 23 03/26/2012 1643   ALT 10 09/16/2013 0908   ALT 14 03/26/2012 1643   BILITOT 0.42 09/16/2013 0908   BILITOT 0.2* 03/26/2012 1643       ASSESSMENT: This is a very pleasant 80 years old [place] American female with plasma cell dyscrasia and currently on treatment with weekly subcutaneous Velcade and Decadron status post 6 weekly doses and tolerating it fairly well. Patient was discussed with also seen by Dr.  Mohamed. Regarding her past complaints of fatigue, a TSH was within normal range at 3.456. Her repeat protein studies showed improvement in her disease most notably her lambda free light chains decreased from a 115 down to 12. Dr. Julien Nordmann reviewed the protein study results with the patient and her daughter.  She will continue with her current treatment with Velcade and Decadron as scheduled for another 9 cycles after which we will do another set of protein studies to reevaluate her disease. She'll followup in 3 weeks for another symptom management visit. The patient has chronic renal failure and is followed by [facility]. Her creatinine today is 4.8 mg/dL these results will be sent to her nephrologist.  She was advised to call immediately if she has any concerning symptoms in the interval.  Carlton Adam, PA-C  ADDENDUM: Hematology/Oncology Attending: I had a face to face encounter with the patient. I recommended her care plan. This is a very pleasant 80 years old [place] American female with history of multiple myeloma currently undergoing treatment with weekly subcutaneous Velcade and Decadron status post 9 cycles. She is rating her treatment fairly well with no significant adverse effects. Her myeloma panel showed significant improvement in her protein study. I recommended for the patient to continue  with her current treatment with Velcade and Decadron for 9 more cycles. For the renal insufficiency the patient is followed by her nephrologist. She was advised to call immediately if she has any concerning symptoms in the interval.  Disclaimer: This note was dictated with voice recognition software. Similar sounding words can inadvertently be transcribed and may not be corrected upon review. Curt Bears, MD 09/19/2013

## 2013-09-16 NOTE — Progress Notes (Signed)
Per Adrena PA, okay to treat with creatinine 4.8.

## 2013-09-16 NOTE — Patient Instructions (Signed)
[facility] Discharge Instructions for Patients Receiving Chemotherapy  Today you received the following chemotherapy agents: Velcade  To help prevent nausea and vomiting after your treatment, we encourage you to take your nausea medication: Compazine 10 mg every 6 hrs as needed or Zofran 8 mg every 8 hrs as needed.    If you develop nausea and vomiting that is not controlled by your nausea medication, call the clinic.   BELOW ARE SYMPTOMS THAT SHOULD BE REPORTED IMMEDIATELY:  *FEVER GREATER THAN 100.5 F  *CHILLS WITH OR WITHOUT FEVER  NAUSEA AND VOMITING THAT IS NOT CONTROLLED WITH YOUR NAUSEA MEDICATION  *UNUSUAL SHORTNESS OF BREATH  *UNUSUAL BRUISING OR BLEEDING  TENDERNESS IN MOUTH AND THROAT WITH OR WITHOUT PRESENCE OF ULCERS  *URINARY PROBLEMS  *BOWEL PROBLEMS  UNUSUAL RASH Items with * indicate a potential emergency and should be followed up as soon as possible.  Feel free to call the clinic you have any questions or concerns. The clinic phone number is [phone].

## 2013-09-16 NOTE — Telephone Encounter (Signed)
gv adnprinted appt sched and avs for pt for April adn May.....sed added tx. °

## 2013-09-17 ENCOUNTER — Ambulatory Visit: Payer: Medicare Other | Admitting: Vascular Surgery

## 2013-09-17 NOTE — Patient Instructions (Signed)
Your protein studies showed improvement in your disease Continue with weekly labs and chemotherapy as scheduled Followup in 3 weeks for another symptom management visit

## 2013-09-23 ENCOUNTER — Other Ambulatory Visit: Payer: Self-pay | Admitting: *Deleted

## 2013-09-23 ENCOUNTER — Other Ambulatory Visit (HOSPITAL_BASED_OUTPATIENT_CLINIC_OR_DEPARTMENT_OTHER): Payer: Medicare Other

## 2013-09-23 ENCOUNTER — Ambulatory Visit (HOSPITAL_BASED_OUTPATIENT_CLINIC_OR_DEPARTMENT_OTHER): Payer: Medicare Other

## 2013-09-23 VITALS — BP 156/70 | HR 86 | Temp 98.3°F | Resp 20

## 2013-09-23 DIAGNOSIS — C9000 Multiple myeloma not having achieved remission: Secondary | ICD-10-CM

## 2013-09-23 DIAGNOSIS — Z5112 Encounter for antineoplastic immunotherapy: Secondary | ICD-10-CM

## 2013-09-23 LAB — CBC WITH DIFFERENTIAL/PLATELET
BASO%: 0.5 % (ref 0.0–2.0)
Basophils Absolute: 0.1 10*3/uL (ref 0.0–0.1)
EOS ABS: 0.1 10*3/uL (ref 0.0–0.5)
EOS%: 1 % (ref 0.0–7.0)
HEMATOCRIT: 32.7 % — AB (ref 34.8–46.6)
HGB: 10.7 g/dL — ABNORMAL LOW (ref 11.6–15.9)
LYMPH%: 7.9 % — AB (ref 14.0–49.7)
MCH: 29.7 pg (ref 25.1–34.0)
MCHC: 32.6 g/dL (ref 31.5–36.0)
MCV: 91.1 fL (ref 79.5–101.0)
MONO#: 1.1 10*3/uL — AB (ref 0.1–0.9)
MONO%: 9.9 % (ref 0.0–14.0)
NEUT%: 80.7 % — AB (ref 38.4–76.8)
NEUTROS ABS: 9.1 10*3/uL — AB (ref 1.5–6.5)
PLATELETS: 243 10*3/uL (ref 145–400)
RBC: 3.59 10*6/uL — ABNORMAL LOW (ref 3.70–5.45)
RDW: 21 % — ABNORMAL HIGH (ref 11.2–14.5)
WBC: 11.3 10*3/uL — ABNORMAL HIGH (ref 3.9–10.3)
lymph#: 0.9 10*3/uL (ref 0.9–3.3)

## 2013-09-23 LAB — COMPREHENSIVE METABOLIC PANEL (CC13)
ALT: 14 U/L (ref 0–55)
ANION GAP: 14 meq/L — AB (ref 3–11)
AST: 14 U/L (ref 5–34)
Albumin: 3.6 g/dL (ref 3.5–5.0)
Alkaline Phosphatase: 91 U/L (ref 40–150)
BILIRUBIN TOTAL: 0.35 mg/dL (ref 0.20–1.20)
BUN: 111.3 mg/dL — AB (ref 7.0–26.0)
CO2: 26 mEq/L (ref 22–29)
CREATININE: 4.5 mg/dL — AB (ref 0.6–1.1)
Calcium: 10.3 mg/dL (ref 8.4–10.4)
Chloride: 102 mEq/L (ref 98–109)
GLUCOSE: 209 mg/dL — AB (ref 70–140)
Potassium: 3.8 mEq/L (ref 3.5–5.1)
Sodium: 143 mEq/L (ref 136–145)
Total Protein: 7.8 g/dL (ref 6.4–8.3)

## 2013-09-23 MED ORDER — ONDANSETRON HCL 8 MG PO TABS
ORAL_TABLET | ORAL | Status: AC
  Filled 2013-09-23: qty 1

## 2013-09-23 MED ORDER — ONDANSETRON HCL 8 MG PO TABS
8.0000 mg | ORAL_TABLET | Freq: Once | ORAL | Status: AC
  Administered 2013-09-23: 8 mg via ORAL

## 2013-09-23 MED ORDER — DOCUSATE SODIUM 100 MG PO CAPS: 100.0000 mg | ORAL_CAPSULE | Freq: Two times a day (BID) | ORAL | Status: DC | PRN

## 2013-09-23 MED ORDER — BORTEZOMIB CHEMO SQ INJECTION 3.5 MG (2.5MG/ML)
1.3000 mg/m2 | Freq: Once | INTRAMUSCULAR | Status: AC
  Administered 2013-09-23: 2.75 mg via SUBCUTANEOUS
  Filled 2013-09-23: qty 2.75

## 2013-09-23 NOTE — Progress Notes (Signed)
Pt notified RN of constipation and bloody stools X approximately one month.  Dr Worthy Flank desk nurse Rudene Anda, RN) notified.  Colletta Maryland spoke to pt and patient's daughter, and instructed pt to take miralax or colace over the counter per package instructions.  Pt's daughter persistently asked for written perscription.  Colletta Maryland reassured pt and pt's daughter that over the counter is what Dr. Julien Nordmann recommends. Pt instructed to call for constipation that is not resolved with over the counter medication or increased blood in stools.

## 2013-09-23 NOTE — Patient Instructions (Signed)
[facility] Discharge Instructions for Patients Receiving Chemotherapy  Today you received the following chemotherapy agents velcade   To help prevent nausea and vomiting after your treatment, we encourage you to take your nausea medication as directed  If you develop nausea and vomiting that is not controlled by your nausea medication, call the clinic.   BELOW ARE SYMPTOMS THAT SHOULD BE REPORTED IMMEDIATELY:  *FEVER GREATER THAN 100.5 F  *CHILLS WITH OR WITHOUT FEVER  NAUSEA AND VOMITING THAT IS NOT CONTROLLED WITH YOUR NAUSEA MEDICATION  *UNUSUAL SHORTNESS OF BREATH  *UNUSUAL BRUISING OR BLEEDING  TENDERNESS IN MOUTH AND THROAT WITH OR WITHOUT PRESENCE OF ULCERS  *URINARY PROBLEMS  *BOWEL PROBLEMS  UNUSUAL RASH Items with * indicate a potential emergency and should be followed up as soon as possible.  Feel free to call the clinic you have any questions or concerns. The clinic phone number is [phone].  

## 2013-09-30 ENCOUNTER — Other Ambulatory Visit: Payer: Self-pay | Admitting: Medical Oncology

## 2013-09-30 ENCOUNTER — Other Ambulatory Visit (HOSPITAL_BASED_OUTPATIENT_CLINIC_OR_DEPARTMENT_OTHER): Payer: Medicare Other

## 2013-09-30 ENCOUNTER — Telehealth: Payer: Self-pay | Admitting: Medical Oncology

## 2013-09-30 ENCOUNTER — Ambulatory Visit (HOSPITAL_BASED_OUTPATIENT_CLINIC_OR_DEPARTMENT_OTHER): Payer: Medicare Other

## 2013-09-30 VITALS — BP 160/69 | HR 73 | Temp 98.4°F

## 2013-09-30 DIAGNOSIS — C9000 Multiple myeloma not having achieved remission: Secondary | ICD-10-CM

## 2013-09-30 DIAGNOSIS — Z5112 Encounter for antineoplastic immunotherapy: Secondary | ICD-10-CM

## 2013-09-30 LAB — COMPREHENSIVE METABOLIC PANEL (CC13)
ALBUMIN: 3.5 g/dL (ref 3.5–5.0)
ALT: 12 U/L (ref 0–55)
ANION GAP: 19 meq/L — AB (ref 3–11)
AST: 14 U/L (ref 5–34)
Alkaline Phosphatase: 85 U/L (ref 40–150)
BUN: 137.6 mg/dL — AB (ref 7.0–26.0)
CHLORIDE: 99 meq/L (ref 98–109)
CO2: 26 meq/L (ref 22–29)
CREATININE: 4.8 mg/dL — AB (ref 0.6–1.1)
Calcium: 10.4 mg/dL (ref 8.4–10.4)
Glucose: 298 mg/dl — ABNORMAL HIGH (ref 70–140)
Potassium: 3.9 mEq/L (ref 3.5–5.1)
Sodium: 144 mEq/L (ref 136–145)
Total Bilirubin: 0.36 mg/dL (ref 0.20–1.20)
Total Protein: 7.5 g/dL (ref 6.4–8.3)

## 2013-09-30 LAB — CBC WITH DIFFERENTIAL/PLATELET
BASO%: 0.2 % (ref 0.0–2.0)
BASOS ABS: 0 10*3/uL (ref 0.0–0.1)
EOS%: 1.4 % (ref 0.0–7.0)
Eosinophils Absolute: 0.1 10*3/uL (ref 0.0–0.5)
HEMATOCRIT: 32.4 % — AB (ref 34.8–46.6)
HGB: 10.5 g/dL — ABNORMAL LOW (ref 11.6–15.9)
LYMPH#: 1 10*3/uL (ref 0.9–3.3)
LYMPH%: 11.1 % — ABNORMAL LOW (ref 14.0–49.7)
MCH: 29 pg (ref 25.1–34.0)
MCHC: 32.4 g/dL (ref 31.5–36.0)
MCV: 89.5 fL (ref 79.5–101.0)
MONO#: 0.6 10*3/uL (ref 0.1–0.9)
MONO%: 6.8 % (ref 0.0–14.0)
NEUT#: 7.2 10*3/uL — ABNORMAL HIGH (ref 1.5–6.5)
NEUT%: 80.5 % — AB (ref 38.4–76.8)
Platelets: 203 10*3/uL (ref 145–400)
RBC: 3.62 10*6/uL — ABNORMAL LOW (ref 3.70–5.45)
RDW: 20.1 % — ABNORMAL HIGH (ref 11.2–14.5)
WBC: 9 10*3/uL (ref 3.9–10.3)

## 2013-09-30 MED ORDER — ONDANSETRON HCL 8 MG PO TABS
ORAL_TABLET | ORAL | Status: AC
  Filled 2013-09-30: qty 1

## 2013-09-30 MED ORDER — ONDANSETRON HCL 8 MG PO TABS
8.0000 mg | ORAL_TABLET | Freq: Once | ORAL | Status: AC
  Administered 2013-09-30: 8 mg via ORAL

## 2013-09-30 MED ORDER — BORTEZOMIB CHEMO SQ INJECTION 3.5 MG (2.5MG/ML)
1.3000 mg/m2 | Freq: Once | INTRAMUSCULAR | Status: AC
  Administered 2013-09-30: 2.75 mg via SUBCUTANEOUS
  Filled 2013-09-30: qty 2.75

## 2013-09-30 NOTE — Patient Instructions (Signed)
[facility] Discharge Instructions for Patients Receiving Chemotherapy  Today you received the following chemotherapy agents: Velcade.  To help prevent nausea and vomiting after your treatment, we encourage you to take your nausea medication as prescribed.   If you develop nausea and vomiting that is not controlled by your nausea medication, call the clinic.   BELOW ARE SYMPTOMS THAT SHOULD BE REPORTED IMMEDIATELY:  *FEVER GREATER THAN 100.5 F  *CHILLS WITH OR WITHOUT FEVER  NAUSEA AND VOMITING THAT IS NOT CONTROLLED WITH YOUR NAUSEA MEDICATION  *UNUSUAL SHORTNESS OF BREATH  *UNUSUAL BRUISING OR BLEEDING  TENDERNESS IN MOUTH AND THROAT WITH OR WITHOUT PRESENCE OF ULCERS  *URINARY PROBLEMS  *BOWEL PROBLEMS  UNUSUAL RASH Items with * indicate a potential emergency and should be followed up as soon as possible.  Feel free to call the clinic you have any questions or concerns. The clinic phone number is [phone].    

## 2013-09-30 NOTE — Telephone Encounter (Signed)
At home after chemo and reports her Finger stick glucose was 559. She said took extra  amaryl and Linwood Dibbles this am. Took oral decadron 40 mg today as part of her multiple myeloma treatment today . I told her to contact Dr Katherine Roan or go to ED.

## 2013-09-30 NOTE — Progress Notes (Signed)
Ok to treat with BUN and Creat per Dr. Julien Nordmann.

## 2013-10-06 ENCOUNTER — Other Ambulatory Visit: Payer: Self-pay | Admitting: *Deleted

## 2013-10-06 DIAGNOSIS — C9000 Multiple myeloma not having achieved remission: Secondary | ICD-10-CM

## 2013-10-07 ENCOUNTER — Ambulatory Visit (HOSPITAL_BASED_OUTPATIENT_CLINIC_OR_DEPARTMENT_OTHER): Payer: Medicare Other

## 2013-10-07 ENCOUNTER — Other Ambulatory Visit (HOSPITAL_BASED_OUTPATIENT_CLINIC_OR_DEPARTMENT_OTHER): Payer: Medicare Other

## 2013-10-07 ENCOUNTER — Encounter: Payer: Self-pay | Admitting: Internal Medicine

## 2013-10-07 ENCOUNTER — Ambulatory Visit (HOSPITAL_BASED_OUTPATIENT_CLINIC_OR_DEPARTMENT_OTHER): Payer: Medicare Other | Admitting: Internal Medicine

## 2013-10-07 ENCOUNTER — Telehealth: Payer: Self-pay | Admitting: Internal Medicine

## 2013-10-07 ENCOUNTER — Telehealth: Payer: Self-pay | Admitting: *Deleted

## 2013-10-07 VITALS — BP 146/44 | HR 72 | Temp 98.6°F | Resp 17 | Ht 66.0 in | Wt 218.2 lb

## 2013-10-07 DIAGNOSIS — C9000 Multiple myeloma not having achieved remission: Secondary | ICD-10-CM

## 2013-10-07 DIAGNOSIS — Z5112 Encounter for antineoplastic immunotherapy: Secondary | ICD-10-CM

## 2013-10-07 DIAGNOSIS — N189 Chronic kidney disease, unspecified: Secondary | ICD-10-CM

## 2013-10-07 LAB — CBC WITH DIFFERENTIAL/PLATELET
BASO%: 0.5 % (ref 0.0–2.0)
BASOS ABS: 0 10*3/uL (ref 0.0–0.1)
EOS%: 0.9 % (ref 0.0–7.0)
Eosinophils Absolute: 0.1 10*3/uL (ref 0.0–0.5)
HCT: 32.1 % — ABNORMAL LOW (ref 34.8–46.6)
HEMOGLOBIN: 10.6 g/dL — AB (ref 11.6–15.9)
LYMPH#: 0.7 10*3/uL — AB (ref 0.9–3.3)
LYMPH%: 10 % — ABNORMAL LOW (ref 14.0–49.7)
MCH: 30 pg (ref 25.1–34.0)
MCHC: 32.8 g/dL (ref 31.5–36.0)
MCV: 91.3 fL (ref 79.5–101.0)
MONO#: 0.6 10*3/uL (ref 0.1–0.9)
MONO%: 8.2 % (ref 0.0–14.0)
NEUT#: 6 10*3/uL (ref 1.5–6.5)
NEUT%: 80.4 % — ABNORMAL HIGH (ref 38.4–76.8)
Platelets: 166 10*3/uL (ref 145–400)
RBC: 3.52 10*6/uL — AB (ref 3.70–5.45)
RDW: 21.6 % — AB (ref 11.2–14.5)
WBC: 7.5 10*3/uL (ref 3.9–10.3)

## 2013-10-07 LAB — COMPREHENSIVE METABOLIC PANEL (CC13)
ALBUMIN: 3.4 g/dL — AB (ref 3.5–5.0)
ALK PHOS: 75 U/L (ref 40–150)
ALT: 12 U/L (ref 0–55)
AST: 14 U/L (ref 5–34)
Anion Gap: 15 mEq/L — ABNORMAL HIGH (ref 3–11)
BUN: >100 mg/dL — ABNORMAL HIGH (ref 7.0–26.0)
CO2: 25 mEq/L (ref 22–29)
Calcium: 9.2 mg/dL (ref 8.4–10.4)
Chloride: 103 mEq/L (ref 98–109)
Creatinine: 4.9 mg/dL (ref 0.6–1.1)
Glucose: 256 mg/dl — ABNORMAL HIGH (ref 70–140)
POTASSIUM: 3.9 meq/L (ref 3.5–5.1)
Sodium: 143 mEq/L (ref 136–145)
Total Bilirubin: 0.5 mg/dL (ref 0.20–1.20)
Total Protein: 7.2 g/dL (ref 6.4–8.3)

## 2013-10-07 MED ORDER — BORTEZOMIB CHEMO SQ INJECTION 3.5 MG (2.5MG/ML)
1.3000 mg/m2 | Freq: Once | INTRAMUSCULAR | Status: AC
  Administered 2013-10-07: 2.75 mg via SUBCUTANEOUS
  Filled 2013-10-07: qty 2.75

## 2013-10-07 MED ORDER — ONDANSETRON HCL 8 MG PO TABS
8.0000 mg | ORAL_TABLET | Freq: Once | ORAL | Status: AC
  Administered 2013-10-07: 8 mg via ORAL

## 2013-10-07 MED ORDER — ONDANSETRON HCL 8 MG PO TABS
ORAL_TABLET | ORAL | Status: AC
  Filled 2013-10-07: qty 1

## 2013-10-07 NOTE — Progress Notes (Signed)
[facility] [phone]   Fax:([phone]  OFFICE PROGRESS NOTE  [name], MD [place] [address]  DIAGNOSIS:  #1 plasma cell dyscrasia, multiple myeloma.  #2 anemia of chronic disease secondary to chronic kidney disease.   CURRENT THERAPY: Treatment with single agent subcutaneous Velcade 1.3 mg/m2 on a weekly basis with Decadron 40 mg by mouth weekly, status post 12 cycles. First cycle to be given 07/15/2013   INTERVAL HISTORY: [name] 80 y.o. female returns to the clinic today for followup visit accompanied by her daughter.  She is tolerating her treatment with subcutaneous Velcade and Decadron fairly well. She denied having any significant nausea or vomiting, no fever or chills. She denied having any significant weight loss or night sweats. The patient denied having any peripheral neuropathy. She has no chest pain, shortness of breath, cough or hemoptysis.  MEDICAL HISTORY: Past Medical History  Diagnosis Date  . Renal failure   . Hypertension   . Diabetes mellitus   . Arthritis   . Asthma   . GERD (gastroesophageal reflux disease)   . Neuropathy in diabetes     Hx: of  . COPD (chronic obstructive pulmonary disease)   . Anemia     ALLERGIES:  is allergic to penicillins cross reactors.  MEDICATIONS:  Current Outpatient Prescriptions  Medication Sig Dispense Refill  . acyclovir (ZOVIRAX) 400 MG tablet Take 1 tablet (400 mg total) by mouth 2 (two) times daily.  60 tablet  2  . allopurinol (ZYLOPRIM) 100 MG tablet Take 100 mg by mouth daily with breakfast.       . amLODipine (NORVASC) 5 MG tablet Take 5 mg by mouth daily with breakfast.       . b complex-vitamin c-folic acid (NEPHRO-VITE) 0.8 MG TABS tablet Take 1 tablet by mouth daily.      . calcitRIOL (ROCALTROL) 0.5 MCG capsule Take 0.5 mcg by mouth daily with breakfast.       . dexamethasone (DECADRON) 4 MG tablet 10 tablet by mouth  every week starting with the first dose of the chemotherapy.  80 tablet  1  . docusate sodium (COLACE) 100 MG capsule Take 1 capsule (100 mg total) by mouth 2 (two) times daily as needed for mild constipation.  10 capsule  0  . ferrous sulfate 325 (65 FE) MG tablet Take 325 mg by mouth daily.      . furosemide (LASIX) 80 MG tablet Take 80 mg by mouth daily.      Marland Kitchen gabapentin (NEURONTIN) 100 MG capsule Take 100 mg by mouth 3 (three) times daily.      Marland Kitchen glimepiride (AMARYL) 1 MG tablet Take 1 mg by mouth daily with breakfast.       . HYDROcodone-acetaminophen (NORCO) 7.5-325 MG per tablet Take 1 tablet by mouth every 6 (six) hours as needed for moderate pain.      Marland Kitchen nystatin (MYCOSTATIN) 100000 UNIT/ML suspension Take 5 mLs (500,000 Units total) swish and spit, four times daily ( every 6 hours)  120 mL  0  . ondansetron (ZOFRAN) 8 MG tablet Take 1 tablet (8 mg total) by mouth every 8 (eight) hours as needed for nausea or vomiting.  20 tablet  1  . ONE TOUCH ULTRA TEST test strip       . Oxycodone HCl 20 MG TABS 20 mg.      . pantoprazole (PROTONIX) 40 MG tablet Take 40 mg by  mouth 2 (two) times daily.       . prochlorperazine (COMPAZINE) 10 MG tablet Take 1 tablet (10 mg total) by mouth every 6 (six) hours as needed for nausea or vomiting.  30 tablet  0  . saxagliptin HCl (ONGLYZA) 2.5 MG TABS tablet Take 2.5 mg by mouth daily with breakfast.        No current facility-administered medications for this visit.    SURGICAL HISTORY:  Past Surgical History  Procedure Laterality Date  . Lobectomy    . Cholecystectomy    . Appendectomy    . Tubal ligation    . Fistula left arm Left 2011    never used  . Colonoscopy      Hx: of  . Tummy tuck      Hx: of  . Abdominal hysterectomy    . Av fistula placement Left 05/15/2013    Procedure: INSERTION OF ARTERIOVENOUS (AV) GORE-TEX GRAFT ARM-LEFT UPPER ARM;  Surgeon: Mal Misty, MD;  Location: [facility];  Service: Vascular;  Laterality: Left;     REVIEW OF SYSTEMS:  Constitutional: positive for fatigue Eyes: negative Ears, nose, mouth, throat, and face: negative Respiratory: negative Cardiovascular: negative Gastrointestinal: negative Genitourinary:negative Integument/breast: negative Hematologic/lymphatic: negative Musculoskeletal:negative Neurological: negative Behavioral/Psych: negative Endocrine: negative Allergic/Immunologic: negative   PHYSICAL EXAMINATION: General appearance: alert, cooperative, fatigued and no distress Head: Normocephalic, without obvious abnormality, atraumatic Neck: no adenopathy Lymph nodes: Cervical, supraclavicular, and axillary nodes normal. Resp: clear to auscultation bilaterally Back: symmetric, no curvature. ROM normal. No CVA tenderness. Cardio: regular rate and rhythm, S1, S2 normal, no murmur, click, rub or gallop GI: soft, non-tender; bowel sounds normal; no masses,  no organomegaly Extremities: extremities normal, atraumatic, no cyanosis or edema Neurologic: Alert and oriented X 3, normal strength and tone. Normal symmetric reflexes. Normal coordination and gait  ECOG PERFORMANCE STATUS: 2 - Symptomatic, <50% confined to bed  Blood pressure 146/44, pulse 72, temperature 98.6 F (37 C), temperature source Oral, resp. rate 17, height 5' 6" (1.676 m), weight 218 lb 3.2 oz (98.975 kg), SpO2 100.00%.  LABORATORY DATA: Lab Results  Component Value Date   WBC 7.5 10/07/2013   HGB 10.6* 10/07/2013   HCT 32.1* 10/07/2013   MCV 91.3 10/07/2013   PLT 166 10/07/2013      Chemistry      Component Value Date/Time   NA 143 10/07/2013 0916   NA 142 09/14/2013 0906   K 3.9 10/07/2013 0916   K 4.1 09/14/2013 0906   CL 98 09/14/2013 0906   CL 98 09/17/2012 1242   CO2 25 10/07/2013 0916   CO2 27 09/14/2013 0906   BUN >100.0* 10/07/2013 0916   BUN 99* 09/14/2013 0906   CREATININE 4.9* 10/07/2013 0916   CREATININE 4.40* 09/14/2013 0906      Component Value Date/Time   CALCIUM 9.2 10/07/2013 0916   CALCIUM  9.1 09/14/2013 0906   CALCIUM 8.6 12/02/2012 0933   ALKPHOS 75 10/07/2013 0916   ALKPHOS 81 03/26/2012 1643   AST 14 10/07/2013 0916   AST 23 03/26/2012 1643   ALT 12 10/07/2013 0916   ALT 14 03/26/2012 1643   BILITOT 0.50 10/07/2013 0916   BILITOT 0.2* 03/26/2012 1643       ASSESSMENT: This is a very pleasant 80 years old [place] American female with plasma cell dyscrasia and currently on treatment with weekly subcutaneous Velcade and Decadron status post 12 weekly doses and tolerating it fairly well and she had improvement in her disease  after cycle #9. I recommended for the patient to continue her current treatment with Velcade and Decadron as scheduled. The patient has chronic renal failure and is followed by [facility]. I would see her back for followup visit in 3 weeks for reevaluation and management any adverse effect of her treatment. She was advised to call immediately if she has any concerning symptoms in the interval.  Disclaimer: This note was dictated with voice recognition software. Similar sounding words can inadvertently be transcribed and may not be corrected upon review.

## 2013-10-07 NOTE — Telephone Encounter (Signed)
Gave pt appt for lab,md and chemo for May and June 2015 °

## 2013-10-07 NOTE — Telephone Encounter (Signed)
Per staff message and POF I have scheduled appts.  JMW  

## 2013-10-07 NOTE — Patient Instructions (Signed)
[facility] Discharge Instructions for Patients Receiving Chemotherapy  Today you received the following chemotherapy agents: Velcade.  To help prevent nausea and vomiting after your treatment, we encourage you to take your nausea medication, Zofran. Take one every 12 hours as needed.   If you develop nausea and vomiting that is not controlled by your nausea medication, call the clinic.   BELOW ARE SYMPTOMS THAT SHOULD BE REPORTED IMMEDIATELY:  *FEVER GREATER THAN 100.5 F  *CHILLS WITH OR WITHOUT FEVER  NAUSEA AND VOMITING THAT IS NOT CONTROLLED WITH YOUR NAUSEA MEDICATION  *UNUSUAL SHORTNESS OF BREATH  *UNUSUAL BRUISING OR BLEEDING  TENDERNESS IN MOUTH AND THROAT WITH OR WITHOUT PRESENCE OF ULCERS  *URINARY PROBLEMS  *BOWEL PROBLEMS  UNUSUAL RASH Items with * indicate a potential emergency and should be followed up as soon as possible.  Feel free to call the clinic should you have any questions or concerns. The clinic phone number is [phone].

## 2013-10-12 ENCOUNTER — Other Ambulatory Visit: Payer: Self-pay | Admitting: Medical Oncology

## 2013-10-12 ENCOUNTER — Encounter (HOSPITAL_COMMUNITY)
Admission: RE | Admit: 2013-10-12 | Discharge: 2013-10-12 | Disposition: A | Discharge status: Home / Self Care | Payer: Medicare Other | Source: Ambulatory Visit | Attending: Nephrology | Admitting: Nephrology

## 2013-10-12 DIAGNOSIS — N184 Chronic kidney disease, stage 4 (severe): Secondary | ICD-10-CM | POA: Insufficient documentation

## 2013-10-12 DIAGNOSIS — D638 Anemia in other chronic diseases classified elsewhere: Secondary | ICD-10-CM | POA: Insufficient documentation

## 2013-10-12 DIAGNOSIS — C9000 Multiple myeloma not having achieved remission: Secondary | ICD-10-CM

## 2013-10-12 LAB — IRON AND TIBC
IRON: 33 ug/dL — AB (ref 42–135)
SATURATION RATIOS: 13 % — AB (ref 20–55)
TIBC: 262 ug/dL (ref 250–470)
UIBC: 229 ug/dL (ref 125–400)

## 2013-10-12 LAB — FERRITIN: FERRITIN: 1381 ng/mL — AB (ref 10–291)

## 2013-10-12 LAB — POCT HEMOGLOBIN-HEMACUE: Hemoglobin: 10.4 g/dL — ABNORMAL LOW (ref 12.0–15.0)

## 2013-10-12 MED ORDER — DARBEPOETIN ALFA-POLYSORBATE 200 MCG/0.4ML IJ SOLN: 200.0000 ug | INTRAMUSCULAR | Status: DC

## 2013-10-12 MED ORDER — DEXAMETHASONE 4 MG PO TABS: ORAL_TABLET | ORAL | Status: DC

## 2013-10-12 MED ORDER — ACYCLOVIR 400 MG PO TABS: 400.0000 mg | ORAL_TABLET | Freq: Two times a day (BID) | ORAL | Status: DC

## 2013-10-12 MED ORDER — DARBEPOETIN ALFA-POLYSORBATE 200 MCG/0.4ML IJ SOLN
INTRAMUSCULAR | Status: AC
  Administered 2013-10-12: 200 ug via SUBCUTANEOUS
  Filled 2013-10-12: qty 0.4

## 2013-10-14 ENCOUNTER — Ambulatory Visit (HOSPITAL_BASED_OUTPATIENT_CLINIC_OR_DEPARTMENT_OTHER): Payer: Medicare Other

## 2013-10-14 ENCOUNTER — Other Ambulatory Visit (HOSPITAL_BASED_OUTPATIENT_CLINIC_OR_DEPARTMENT_OTHER): Payer: Medicare Other

## 2013-10-14 ENCOUNTER — Other Ambulatory Visit: Payer: Self-pay | Admitting: Internal Medicine

## 2013-10-14 ENCOUNTER — Other Ambulatory Visit: Payer: Self-pay | Admitting: Medical Oncology

## 2013-10-14 VITALS — BP 166/54 | HR 77 | Temp 97.9°F

## 2013-10-14 DIAGNOSIS — C9000 Multiple myeloma not having achieved remission: Secondary | ICD-10-CM

## 2013-10-14 DIAGNOSIS — C9002 Multiple myeloma in relapse: Secondary | ICD-10-CM

## 2013-10-14 DIAGNOSIS — R82998 Other abnormal findings in urine: Secondary | ICD-10-CM

## 2013-10-14 DIAGNOSIS — Z5112 Encounter for antineoplastic immunotherapy: Secondary | ICD-10-CM

## 2013-10-14 LAB — COMPREHENSIVE METABOLIC PANEL (CC13)
ALK PHOS: 47 U/L (ref 40–150)
ALT: 20 U/L (ref 0–55)
AST: 17 U/L (ref 5–34)
Albumin: 3.3 g/dL — ABNORMAL LOW (ref 3.5–5.0)
Anion Gap: 19 mEq/L — ABNORMAL HIGH (ref 3–11)
BILIRUBIN TOTAL: 0.49 mg/dL (ref 0.20–1.20)
BUN: 128.5 mg/dL — AB (ref 7.0–26.0)
CO2: 26 mEq/L (ref 22–29)
Calcium: 10.2 mg/dL (ref 8.4–10.4)
Chloride: 97 mEq/L — ABNORMAL LOW (ref 98–109)
Creatinine: 5.6 mg/dL (ref 0.6–1.1)
GLUCOSE: 198 mg/dL — AB (ref 70–140)
Potassium: 3.8 mEq/L (ref 3.5–5.1)
Sodium: 142 mEq/L (ref 136–145)
Total Protein: 7.6 g/dL (ref 6.4–8.3)

## 2013-10-14 LAB — URINALYSIS, MICROSCOPIC - CHCC
BILIRUBIN (URINE): NEGATIVE
BLOOD: NEGATIVE
GLUCOSE UR CHCC: NEGATIVE mg/dL
Ketones: NEGATIVE mg/dL
Nitrite: NEGATIVE
PH: 6 (ref 4.6–8.0)
Protein: <30 mg/dL
SPECIFIC GRAVITY, URINE: 1.01 (ref 1.003–1.035)
Urobilinogen, UR: 0.2 mg/dL (ref 0.2–1)

## 2013-10-14 LAB — CBC WITH DIFFERENTIAL/PLATELET
BASO%: 0.2 % (ref 0.0–2.0)
Basophils Absolute: 0 10*3/uL (ref 0.0–0.1)
EOS ABS: 0.1 10*3/uL (ref 0.0–0.5)
EOS%: 1.1 % (ref 0.0–7.0)
HCT: 31.1 % — ABNORMAL LOW (ref 34.8–46.6)
HGB: 10.3 g/dL — ABNORMAL LOW (ref 11.6–15.9)
LYMPH%: 13.7 % — AB (ref 14.0–49.7)
MCH: 29.4 pg (ref 25.1–34.0)
MCHC: 33.1 g/dL (ref 31.5–36.0)
MCV: 88.9 fL (ref 79.5–101.0)
MONO#: 0.7 10*3/uL (ref 0.1–0.9)
MONO%: 7.9 % (ref 0.0–14.0)
NEUT%: 77.1 % — ABNORMAL HIGH (ref 38.4–76.8)
NEUTROS ABS: 6.7 10*3/uL — AB (ref 1.5–6.5)
PLATELETS: 207 10*3/uL (ref 145–400)
RBC: 3.5 10*6/uL — AB (ref 3.70–5.45)
RDW: 19.5 % — ABNORMAL HIGH (ref 11.2–14.5)
WBC: 8.7 10*3/uL (ref 3.9–10.3)
lymph#: 1.2 10*3/uL (ref 0.9–3.3)

## 2013-10-14 MED ORDER — BORTEZOMIB CHEMO SQ INJECTION 3.5 MG (2.5MG/ML)
1.3000 mg/m2 | Freq: Once | INTRAMUSCULAR | Status: AC
  Administered 2013-10-14: 2.75 mg via SUBCUTANEOUS
  Filled 2013-10-14: qty 2.75

## 2013-10-14 MED ORDER — ONDANSETRON HCL 8 MG PO TABS
8.0000 mg | ORAL_TABLET | Freq: Once | ORAL | Status: AC
  Administered 2013-10-14: 8 mg via ORAL

## 2013-10-14 MED ORDER — CIPROFLOXACIN HCL 250 MG PO TABS: 250.0000 mg | ORAL_TABLET | Freq: Two times a day (BID) | ORAL | Status: DC

## 2013-10-14 MED ORDER — ONDANSETRON HCL 8 MG PO TABS
ORAL_TABLET | ORAL | Status: AC
  Filled 2013-10-14: qty 1

## 2013-10-14 NOTE — Telephone Encounter (Signed)
I called in cipro per Dr Julien Nordmann and pt and her daughter, Mariann Laster,  notified. I instructed pt (and her daughter) to check her blood glucose hs and any time she feels lightheaded because of possibility of low blood glucose with cipro and amaryl. Pt stated her blood glucose today has been in 400. Both voiced understanding.

## 2013-10-14 NOTE — Progress Notes (Signed)
0930-Pt states that she has been having lower abdominal pain since Sunday night and difficulty holding urine.  No complaints of chills or fever.  Dr. Julien Nordmann notified and order received to obtain urinalysis and urine culture. OK to treat with Velcade today.

## 2013-10-15 LAB — URINE CULTURE

## 2013-10-21 ENCOUNTER — Other Ambulatory Visit (HOSPITAL_BASED_OUTPATIENT_CLINIC_OR_DEPARTMENT_OTHER): Payer: Medicare Other

## 2013-10-21 ENCOUNTER — Ambulatory Visit (HOSPITAL_BASED_OUTPATIENT_CLINIC_OR_DEPARTMENT_OTHER): Payer: Medicare Other

## 2013-10-21 ENCOUNTER — Ambulatory Visit: Payer: Medicare Other | Admitting: Nutrition

## 2013-10-21 VITALS — BP 147/54 | HR 72 | Temp 98.1°F | Resp 18

## 2013-10-21 DIAGNOSIS — C9002 Multiple myeloma in relapse: Secondary | ICD-10-CM

## 2013-10-21 DIAGNOSIS — C9000 Multiple myeloma not having achieved remission: Secondary | ICD-10-CM

## 2013-10-21 DIAGNOSIS — Z5112 Encounter for antineoplastic immunotherapy: Secondary | ICD-10-CM

## 2013-10-21 LAB — CBC WITH DIFFERENTIAL/PLATELET
BASO%: 0.5 % (ref 0.0–2.0)
Basophils Absolute: 0 10*3/uL (ref 0.0–0.1)
EOS%: 1.6 % (ref 0.0–7.0)
Eosinophils Absolute: 0.1 10*3/uL (ref 0.0–0.5)
HCT: 33.5 % — ABNORMAL LOW (ref 34.8–46.6)
HGB: 11 g/dL — ABNORMAL LOW (ref 11.6–15.9)
LYMPH#: 1.2 10*3/uL (ref 0.9–3.3)
LYMPH%: 13.1 % — ABNORMAL LOW (ref 14.0–49.7)
MCH: 30.5 pg (ref 25.1–34.0)
MCHC: 32.9 g/dL (ref 31.5–36.0)
MCV: 92.7 fL (ref 79.5–101.0)
MONO#: 0.8 10*3/uL (ref 0.1–0.9)
MONO%: 9.1 % (ref 0.0–14.0)
NEUT#: 6.7 10*3/uL — ABNORMAL HIGH (ref 1.5–6.5)
NEUT%: 75.7 % (ref 38.4–76.8)
Platelets: 247 10*3/uL (ref 145–400)
RBC: 3.61 10*6/uL — ABNORMAL LOW (ref 3.70–5.45)
RDW: 21.2 % — AB (ref 11.2–14.5)
WBC: 8.8 10*3/uL (ref 3.9–10.3)

## 2013-10-21 LAB — COMPREHENSIVE METABOLIC PANEL (CC13)
ALT: 16 U/L (ref 0–55)
AST: 16 U/L (ref 5–34)
Albumin: 3.5 g/dL (ref 3.5–5.0)
Alkaline Phosphatase: 61 U/L (ref 40–150)
Anion Gap: 17 mEq/L — ABNORMAL HIGH (ref 3–11)
BUN: 108.2 mg/dL — ABNORMAL HIGH (ref 7.0–26.0)
CALCIUM: 10.1 mg/dL (ref 8.4–10.4)
CHLORIDE: 93 meq/L — AB (ref 98–109)
CO2: 25 mEq/L (ref 22–29)
CREATININE: 5.3 mg/dL — AB (ref 0.6–1.1)
Glucose: 199 mg/dl — ABNORMAL HIGH (ref 70–140)
POTASSIUM: 3.7 meq/L (ref 3.5–5.1)
SODIUM: 136 meq/L (ref 136–145)
Total Bilirubin: 0.51 mg/dL (ref 0.20–1.20)
Total Protein: 7.6 g/dL (ref 6.4–8.3)

## 2013-10-21 MED ORDER — ONDANSETRON HCL 8 MG PO TABS
ORAL_TABLET | ORAL | Status: AC
  Filled 2013-10-21: qty 1

## 2013-10-21 MED ORDER — ONDANSETRON HCL 8 MG PO TABS
8.0000 mg | ORAL_TABLET | Freq: Once | ORAL | Status: AC
  Administered 2013-10-21: 8 mg via ORAL

## 2013-10-21 MED ORDER — BORTEZOMIB CHEMO SQ INJECTION 3.5 MG (2.5MG/ML)
1.3000 mg/m2 | Freq: Once | INTRAMUSCULAR | Status: AC
  Administered 2013-10-21: 2.75 mg via SUBCUTANEOUS
  Filled 2013-10-21: qty 2.75

## 2013-10-21 NOTE — Patient Instructions (Signed)
[facility] Discharge Instructions for Patients Receiving Chemotherapy  Today you received the following chemotherapy agents velcade.  To help prevent nausea and vomiting after your treatment, we encourage you to take your nausea medication as prescribed.   If you develop nausea and vomiting that is not controlled by your nausea medication, call the clinic.   BELOW ARE SYMPTOMS THAT SHOULD BE REPORTED IMMEDIATELY:  *FEVER GREATER THAN 100.5 F  *CHILLS WITH OR WITHOUT FEVER  NAUSEA AND VOMITING THAT IS NOT CONTROLLED WITH YOUR NAUSEA MEDICATION  *UNUSUAL SHORTNESS OF BREATH  *UNUSUAL BRUISING OR BLEEDING  TENDERNESS IN MOUTH AND THROAT WITH OR WITHOUT PRESENCE OF ULCERS  *URINARY PROBLEMS  *BOWEL PROBLEMS  UNUSUAL RASH Items with * indicate a potential emergency and should be followed up as soon as possible.  Feel free to call the clinic you have any questions or concerns. The clinic phone number is [phone].    

## 2013-10-21 NOTE — Progress Notes (Signed)
80 year-old female diagnosed with multiple myeloma.  She is a patient of Dr. Earlie Server.  Past medical history includes renal failure, hypertension, diabetes, GERD, COPD, and anemia.  Medications include Nephro-Vite, Decadron, Colace, ferrous sulfate, Lasix, Amaryl, Zofran, Protonix, and Velcade.  Labs include glucose 199, BUN 108, creatinine 5.3, albumin 3.5 on May 20.  Height: 66 inches. Weight: 218.2 pounds May 6. Usual body weight: 220 pounds. BMI: 35.24.  Estimated Nutrition Needs:  1800-2000 calories, 55-70 grams protein, 2 L fluid.  Patient reports poor appetite.  Patient had a hard time providing dietary recall.  States she does not eat any type of meat.  She does drink boost glucose control providing 190 calories, 16g carbohydrate, and 16 g protein.  She is drinking 3 daily.  States she typically eats one egg for breakfast and vegetables for lunch and dinner.  Patient is obese with BMI 35.24.  Followed by nephrology for kidney disease. Does not appear to have good knowledge of CHO controlled diet.  Nutrition Diagnosis:  Food and Nutrition Related Knowledge Deficit related to multiple myeloma, DM and Kidney disease as evidenced by patient verbalizing inaccurate and incomplete information.  Intervention:  Educated patient on importance of CHO controlled diet with adequate calories and protein to meet estimated needs.  Stressed decrease intake of concentrated sweets.  Provided tips for improved glycemic control.  Recommended patient continue Boost Glucose Control based on oral intake for meeting protein needs.  Provided fact sheets and contact information.  Teach back method used.  Recommend referral to Renal dietitian as needed for diet education for kidney disease.  Monitoring, Evaluation, Goals:  Patient will consume a CHO controlled diet with adequate calories and protein to meet estimated needs and minimize worsening of CKD.  Next Visit: Patient will contact me for questions or  concerns.

## 2013-10-27 ENCOUNTER — Encounter (HOSPITAL_COMMUNITY): Payer: Self-pay

## 2013-10-28 ENCOUNTER — Ambulatory Visit (HOSPITAL_BASED_OUTPATIENT_CLINIC_OR_DEPARTMENT_OTHER): Payer: Medicare Other

## 2013-10-28 ENCOUNTER — Encounter: Payer: Self-pay | Admitting: Physician Assistant

## 2013-10-28 ENCOUNTER — Other Ambulatory Visit (HOSPITAL_BASED_OUTPATIENT_CLINIC_OR_DEPARTMENT_OTHER): Payer: Medicare Other

## 2013-10-28 ENCOUNTER — Ambulatory Visit (HOSPITAL_BASED_OUTPATIENT_CLINIC_OR_DEPARTMENT_OTHER): Payer: Medicare Other | Admitting: Physician Assistant

## 2013-10-28 VITALS — BP 146/57 | HR 78 | Temp 98.6°F | Resp 18 | Ht 66.0 in | Wt 215.8 lb

## 2013-10-28 DIAGNOSIS — C9000 Multiple myeloma not having achieved remission: Secondary | ICD-10-CM

## 2013-10-28 DIAGNOSIS — E8809 Other disorders of plasma-protein metabolism, not elsewhere classified: Secondary | ICD-10-CM

## 2013-10-28 DIAGNOSIS — C9002 Multiple myeloma in relapse: Secondary | ICD-10-CM

## 2013-10-28 DIAGNOSIS — R5381 Other malaise: Secondary | ICD-10-CM

## 2013-10-28 DIAGNOSIS — Z5112 Encounter for antineoplastic immunotherapy: Secondary | ICD-10-CM

## 2013-10-28 DIAGNOSIS — R5383 Other fatigue: Secondary | ICD-10-CM

## 2013-10-28 LAB — COMPREHENSIVE METABOLIC PANEL (CC13)
ALK PHOS: 64 U/L (ref 40–150)
ALT: 16 U/L (ref 0–55)
AST: 15 U/L (ref 5–34)
Albumin: 3.5 g/dL (ref 3.5–5.0)
Anion Gap: 17 mEq/L — ABNORMAL HIGH (ref 3–11)
BILIRUBIN TOTAL: 0.64 mg/dL (ref 0.20–1.20)
BUN: 122.1 mg/dL — AB (ref 7.0–26.0)
CO2: 30 mEq/L — ABNORMAL HIGH (ref 22–29)
CREATININE: 5.2 mg/dL — AB (ref 0.6–1.1)
Calcium: 11 mg/dL — ABNORMAL HIGH (ref 8.4–10.4)
Chloride: 96 mEq/L — ABNORMAL LOW (ref 98–109)
Glucose: 239 mg/dl — ABNORMAL HIGH (ref 70–140)
POTASSIUM: 3.8 meq/L (ref 3.5–5.1)
Sodium: 142 mEq/L (ref 136–145)
Total Protein: 7.4 g/dL (ref 6.4–8.3)

## 2013-10-28 LAB — CBC WITH DIFFERENTIAL/PLATELET
BASO%: 0.8 % (ref 0.0–2.0)
BASOS ABS: 0.1 10*3/uL (ref 0.0–0.1)
EOS%: 1.2 % (ref 0.0–7.0)
Eosinophils Absolute: 0.1 10*3/uL (ref 0.0–0.5)
HCT: 32.7 % — ABNORMAL LOW (ref 34.8–46.6)
HEMOGLOBIN: 10.7 g/dL — AB (ref 11.6–15.9)
LYMPH%: 11.4 % — ABNORMAL LOW (ref 14.0–49.7)
MCH: 30.3 pg (ref 25.1–34.0)
MCHC: 32.8 g/dL (ref 31.5–36.0)
MCV: 92.6 fL (ref 79.5–101.0)
MONO#: 0.8 10*3/uL (ref 0.1–0.9)
MONO%: 9.6 % (ref 0.0–14.0)
NEUT#: 6 10*3/uL (ref 1.5–6.5)
NEUT%: 77 % — AB (ref 38.4–76.8)
Platelets: 199 10*3/uL (ref 145–400)
RBC: 3.54 10*6/uL — ABNORMAL LOW (ref 3.70–5.45)
RDW: 21.8 % — AB (ref 11.2–14.5)
WBC: 7.8 10*3/uL (ref 3.9–10.3)
lymph#: 0.9 10*3/uL (ref 0.9–3.3)

## 2013-10-28 MED ORDER — BORTEZOMIB CHEMO SQ INJECTION 3.5 MG (2.5MG/ML)
1.3000 mg/m2 | Freq: Once | INTRAMUSCULAR | Status: AC
  Administered 2013-10-28: 2.75 mg via SUBCUTANEOUS
  Filled 2013-10-28: qty 2.75

## 2013-10-28 MED ORDER — ONDANSETRON HCL 8 MG PO TABS
ORAL_TABLET | ORAL | Status: AC
  Filled 2013-10-28: qty 1

## 2013-10-28 MED ORDER — ONDANSETRON HCL 8 MG PO TABS
8.0000 mg | ORAL_TABLET | Freq: Once | ORAL | Status: AC
  Administered 2013-10-28: 8 mg via ORAL

## 2013-10-28 NOTE — Progress Notes (Addendum)
[facility] Telephone:([phone]   Fax:(336) [phone]  SHARED VISIT PROGRESS NOTE  [name], MD [place] [address]  DIAGNOSIS:  #1 plasma cell dyscrasia, multiple myeloma.  #2 anemia of chronic disease secondary to chronic kidney disease.   CURRENT THERAPY: Treatment with single agent subcutaneous Velcade 1.3 mg/m2 on a weekly basis with Decadron 40 mg by mouth weekly, status post 12 cycles. First cycle to be given 07/15/2013   INTERVAL HISTORY: [name] 80 y.o. female returns to the clinic today for followup visit accompanied by her daughter.  She is tolerating her treatment with subcutaneous Velcade and Decadron fairly well. She denied having any significant nausea or vomiting, no fever or chills. She denied having any significant weight loss. She continues to have night sweats but states they are no different from her baseline. The patient denied having any peripheral neuropathy. She has no chest pain, shortness of breath, cough or hemoptysis. She reports some stomach pains that feel as though it burning from the inside. Patient states that she scratches the area that is bothering her and it eventually gets better. She reports some occasional episodes of constipation but is well manage with an over-the-counter stool softener.  MEDICAL HISTORY: Past Medical History  Diagnosis Date  . Renal failure   . Hypertension   . Diabetes mellitus   . Arthritis   . Asthma   . GERD (gastroesophageal reflux disease)   . Neuropathy in diabetes     Hx: of  . COPD (chronic obstructive pulmonary disease)   . Anemia     ALLERGIES:  is allergic to penicillins cross reactors.  MEDICATIONS:  Current Outpatient Prescriptions  Medication Sig Dispense Refill  . acyclovir (ZOVIRAX) 400 MG tablet Take 1 tablet (400 mg total) by mouth 2 (two) times daily.  60 tablet  2  . allopurinol (ZYLOPRIM) 100 MG tablet Take 100 mg by mouth  daily with breakfast.       . amLODipine (NORVASC) 5 MG tablet Take 5 mg by mouth daily with breakfast.       . b complex-vitamin c-folic acid (NEPHRO-VITE) 0.8 MG TABS tablet Take 1 tablet by mouth daily.      . calcitRIOL (ROCALTROL) 0.5 MCG capsule Take 0.5 mcg by mouth daily with breakfast.       . dexamethasone (DECADRON) 4 MG tablet 10 tablet by mouth every week starting with the first dose of the chemotherapy.  80 tablet  1  . docusate sodium (COLACE) 100 MG capsule Take 1 capsule (100 mg total) by mouth 2 (two) times daily as needed for mild constipation.  10 capsule  0  . ferrous sulfate 325 (65 FE) MG tablet Take 325 mg by mouth daily.      . furosemide (LASIX) 80 MG tablet Take 80 mg by mouth daily.      Marland Kitchen gabapentin (NEURONTIN) 100 MG capsule Take 100 mg by mouth 3 (three) times daily.      Marland Kitchen glimepiride (AMARYL) 1 MG tablet Take 1 mg by mouth daily with breakfast.       . HYDROcodone-acetaminophen (NORCO) 7.5-325 MG per tablet Take 1 tablet by mouth every 6 (six) hours as needed for moderate pain.      Marland Kitchen nystatin (MYCOSTATIN) 100000 UNIT/ML suspension Take 5 mLs (500,000 Units total) swish and spit, four times daily ( every 6 hours)  120 mL  0  . ONE TOUCH ULTRA TEST test strip       .  Oxycodone HCl 20 MG TABS 20 mg.      . pantoprazole (PROTONIX) 40 MG tablet Take 40 mg by mouth 2 (two) times daily.       . saxagliptin HCl (ONGLYZA) 2.5 MG TABS tablet Take 2.5 mg by mouth daily with breakfast.       . ondansetron (ZOFRAN) 8 MG tablet Take 1 tablet (8 mg total) by mouth every 8 (eight) hours as needed for nausea or vomiting.  20 tablet  1  . prochlorperazine (COMPAZINE) 10 MG tablet Take 1 tablet (10 mg total) by mouth every 6 (six) hours as needed for nausea or vomiting.  30 tablet  0   No current facility-administered medications for this visit.    SURGICAL HISTORY:  Past Surgical History  Procedure Laterality Date  . Lobectomy    . Cholecystectomy    . Appendectomy    .  Tubal ligation    . Fistula left arm Left 2011    never used  . Colonoscopy      Hx: of  . Tummy tuck      Hx: of  . Abdominal hysterectomy    . Av fistula placement Left 05/15/2013    Procedure: INSERTION OF ARTERIOVENOUS (AV) GORE-TEX GRAFT ARM-LEFT UPPER ARM;  Surgeon: Mal Misty, MD;  Location: [facility];  Service: Vascular;  Laterality: Left;    REVIEW OF SYSTEMS:  Constitutional: positive for fatigue Eyes: negative Ears, nose, mouth, throat, and face: negative Respiratory: negative Cardiovascular: negative Gastrointestinal: positive for constipation Genitourinary:negative Integument/breast: negative Hematologic/lymphatic: negative Musculoskeletal:negative Neurological: negative Behavioral/Psych: negative Endocrine: negative Allergic/Immunologic: negative   PHYSICAL EXAMINATION: General appearance: alert, cooperative, fatigued and no distress Head: Normocephalic, without obvious abnormality, atraumatic Neck: no adenopathy Lymph nodes: Cervical, supraclavicular, and axillary nodes normal. Resp: clear to auscultation bilaterally Back: symmetric, no curvature. ROM normal. No CVA tenderness. Cardio: regular rate and rhythm, S1, S2 normal, no murmur, click, rub or gallop GI: soft, non-tender; bowel sounds normal; no masses,  no organomegaly Extremities: extremities normal, atraumatic, no cyanosis or edema Neurologic: Alert and oriented X 3, normal strength and tone. Normal symmetric reflexes. Normal coordination and gait  ECOG PERFORMANCE STATUS: 2 - Symptomatic, <50% confined to bed  Blood pressure 146/57, pulse 78, temperature 98.6 F (37 C), temperature source Oral, resp. rate 18, height $RemoveBe'5\' 6"'ERQPifleL$  (1.676 m), weight 215 lb 12.8 oz (97.886 kg).  LABORATORY DATA: Lab Results  Component Value Date   WBC 7.8 10/28/2013   HGB 10.7* 10/28/2013   HCT 32.7* 10/28/2013   MCV 92.6 10/28/2013   PLT 199 10/28/2013      Chemistry      Component Value Date/Time   NA 142 10/28/2013  0919   NA 142 09/14/2013 0906   K 3.8 10/28/2013 0919   K 4.1 09/14/2013 0906   CL 98 09/14/2013 0906   CL 98 09/17/2012 1242   CO2 30* 10/28/2013 0919   CO2 27 09/14/2013 0906   BUN 122.1* 10/28/2013 0919   BUN 99* 09/14/2013 0906   CREATININE 5.2* 10/28/2013 0919   CREATININE 4.40* 09/14/2013 0906      Component Value Date/Time   CALCIUM 11.0* 10/28/2013 0919   CALCIUM 9.1 09/14/2013 0906   CALCIUM 8.6 12/02/2012 0933   ALKPHOS 64 10/28/2013 0919   ALKPHOS 81 03/26/2012 1643   AST 15 10/28/2013 0919   AST 23 03/26/2012 1643   ALT 16 10/28/2013 0919   ALT 14 03/26/2012 1643   BILITOT 0.64 10/28/2013 0919   BILITOT  0.2* 03/26/2012 1643       ASSESSMENT: This is a very pleasant 80 years old [place] American female with plasma cell dyscrasia and currently on treatment with weekly subcutaneous Velcade and Decadron status post 16 weekly doses and tolerating it fairly well and she had improvement in her disease after cycle #9. The patient was discussed with him also seen by Dr. Julien Nordmann. She will continue on her treatment with Velcade and Decadron as scheduled. We will plan to draw a restaging protein studies with cycle #18. She'll followup with Dr. Julien Nordmann in 3 weeks for another symptom management visit as well as to discuss the results of the restaging protein studies appear  She was advised to call immediately if she has any concerning symptoms in the interval.  Carlton Adam, PA-C  ADDENDUM: Hematology/Oncology Attending:  I had a face to face encounter with the patient. I recommended her care plan. This is a very pleasant 80 years old [place] American female with a plasma cell dyscrasia consistent with multiple myeloma and currently on treatment with weekly subcutaneous Velcade and Decadron status post 15 cycles. The patient is tolerating her treatment well with no significant adverse effects except for mild fatigue. We'll proceed with cycle #16 today as scheduled. She would come back for follow  up visit in 3 weeks after repeating myeloma panel for reevaluation of her disease. She was advised to call immediately if she has any concerning symptoms in the interval.  Disclaimer: This note was dictated with voice recognition software. Similar sounding words can inadvertently be transcribed and may not be corrected upon review. Curt Bears, MD 11/03/2013

## 2013-10-28 NOTE — Progress Notes (Signed)
Okay to treat today with creatinine 5.2 per Awilda Metro, PA. Cindi Carbon, RN

## 2013-10-28 NOTE — Patient Instructions (Signed)
[facility] Discharge Instructions for Patients Receiving Chemotherapy  Today you received the following chemotherapy agents: Velcade.  To help prevent nausea and vomiting after your treatment, we encourage you to take your nausea medication as prescribed.   If you develop nausea and vomiting that is not controlled by your nausea medication, call the clinic.   BELOW ARE SYMPTOMS THAT SHOULD BE REPORTED IMMEDIATELY:  *FEVER GREATER THAN 100.5 F  *CHILLS WITH OR WITHOUT FEVER  NAUSEA AND VOMITING THAT IS NOT CONTROLLED WITH YOUR NAUSEA MEDICATION  *UNUSUAL SHORTNESS OF BREATH  *UNUSUAL BRUISING OR BLEEDING  TENDERNESS IN MOUTH AND THROAT WITH OR WITHOUT PRESENCE OF ULCERS  *URINARY PROBLEMS  *BOWEL PROBLEMS  UNUSUAL RASH Items with * indicate a potential emergency and should be followed up as soon as possible.  Feel free to call the clinic you have any questions or concerns. The clinic phone number is [phone].    

## 2013-10-29 NOTE — Patient Instructions (Signed)
Continue labs and chemotherapy as scheduled Followup with Dr. Julien Nordmann in 3 weeks with repeat protein studies to reevaluate your disease

## 2013-11-04 ENCOUNTER — Ambulatory Visit: Payer: Medicare Other

## 2013-11-04 ENCOUNTER — Other Ambulatory Visit (HOSPITAL_BASED_OUTPATIENT_CLINIC_OR_DEPARTMENT_OTHER): Payer: Medicare Other

## 2013-11-04 ENCOUNTER — Telehealth: Payer: Self-pay | Admitting: Internal Medicine

## 2013-11-04 DIAGNOSIS — C9000 Multiple myeloma not having achieved remission: Secondary | ICD-10-CM

## 2013-11-04 DIAGNOSIS — C9002 Multiple myeloma in relapse: Secondary | ICD-10-CM

## 2013-11-04 LAB — CBC WITH DIFFERENTIAL/PLATELET
BASO%: 0 % (ref 0.0–2.0)
BASOS ABS: 0 10*3/uL (ref 0.0–0.1)
EOS ABS: 0.1 10*3/uL (ref 0.0–0.5)
EOS%: 0.8 % (ref 0.0–7.0)
HEMATOCRIT: 34.2 % — AB (ref 34.8–46.6)
HEMOGLOBIN: 11.1 g/dL — AB (ref 11.6–15.9)
LYMPH%: 10.7 % — AB (ref 14.0–49.7)
MCH: 30.3 pg (ref 25.1–34.0)
MCHC: 32.4 g/dL (ref 31.5–36.0)
MCV: 93.5 fL (ref 79.5–101.0)
MONO#: 0.8 10*3/uL (ref 0.1–0.9)
MONO%: 7.9 % (ref 0.0–14.0)
NEUT%: 80.6 % — ABNORMAL HIGH (ref 38.4–76.8)
NEUTROS ABS: 7.8 10*3/uL — AB (ref 1.5–6.5)
PLATELETS: 171 10*3/uL (ref 145–400)
RBC: 3.65 10*6/uL — ABNORMAL LOW (ref 3.70–5.45)
RDW: 20.5 % — ABNORMAL HIGH (ref 11.2–14.5)
WBC: 9.7 10*3/uL (ref 3.9–10.3)
lymph#: 1 10*3/uL (ref 0.9–3.3)

## 2013-11-04 LAB — COMPREHENSIVE METABOLIC PANEL (CC13)
ALBUMIN: 3.8 g/dL (ref 3.5–5.0)
ALK PHOS: 61 U/L (ref 40–150)
ALT: 14 U/L (ref 0–55)
ANION GAP: 24 meq/L — AB (ref 3–11)
AST: 15 U/L (ref 5–34)
BILIRUBIN TOTAL: 0.43 mg/dL (ref 0.20–1.20)
BUN: 156.4 mg/dL — AB (ref 7.0–26.0)
CO2: 25 meq/L (ref 22–29)
Calcium: 10.9 mg/dL — ABNORMAL HIGH (ref 8.4–10.4)
Chloride: 94 mEq/L — ABNORMAL LOW (ref 98–109)
Creatinine: 6.8 mg/dL (ref 0.6–1.1)
GLUCOSE: 229 mg/dL — AB (ref 70–140)
POTASSIUM: 3.8 meq/L (ref 3.5–5.1)
Sodium: 142 mEq/L (ref 136–145)
Total Protein: 8 g/dL (ref 6.4–8.3)

## 2013-11-04 NOTE — Telephone Encounter (Signed)
per repsonse from [facility] ok for pt to see LT 6/17. added additional appts for 6/17, 6/24 and 7/1. s/w pt she is aware and will get new schedule 6/10.

## 2013-11-04 NOTE — Progress Notes (Signed)
Per Dr Julien Nordmann, hold velcade today.  Creatine up to 6.8.  Spoke to patient in lobby, given copy of lab work.  Instructed patient to follow up with nephrologist and return next week for repeat lab work.  Patient and daughter verbalized understanding.

## 2013-11-09 ENCOUNTER — Inpatient Hospital Stay (HOSPITAL_COMMUNITY)
Admission: EM | Admit: 2013-11-09 | Discharge: 2013-11-11 | DRG: 682 | Disposition: A | Discharge status: Home / Self Care | Payer: Medicare Other | Attending: Nephrology | Admitting: Nephrology

## 2013-11-09 ENCOUNTER — Encounter: Payer: Self-pay | Admitting: Internal Medicine

## 2013-11-09 ENCOUNTER — Emergency Department (HOSPITAL_COMMUNITY): Payer: Medicare Other

## 2013-11-09 ENCOUNTER — Encounter (HOSPITAL_COMMUNITY): Payer: Medicare Other

## 2013-11-09 ENCOUNTER — Encounter (HOSPITAL_COMMUNITY): Payer: Self-pay | Admitting: Emergency Medicine

## 2013-11-09 DIAGNOSIS — R109 Unspecified abdominal pain: Secondary | ICD-10-CM

## 2013-11-09 DIAGNOSIS — N2581 Secondary hyperparathyroidism of renal origin: Secondary | ICD-10-CM | POA: Diagnosis present

## 2013-11-09 DIAGNOSIS — D649 Anemia, unspecified: Secondary | ICD-10-CM

## 2013-11-09 DIAGNOSIS — D631 Anemia in chronic kidney disease: Secondary | ICD-10-CM | POA: Diagnosis present

## 2013-11-09 DIAGNOSIS — D472 Monoclonal gammopathy: Secondary | ICD-10-CM

## 2013-11-09 DIAGNOSIS — E119 Type 2 diabetes mellitus without complications: Secondary | ICD-10-CM

## 2013-11-09 DIAGNOSIS — E1142 Type 2 diabetes mellitus with diabetic polyneuropathy: Secondary | ICD-10-CM | POA: Diagnosis present

## 2013-11-09 DIAGNOSIS — K5792 Diverticulitis of intestine, part unspecified, without perforation or abscess without bleeding: Secondary | ICD-10-CM

## 2013-11-09 DIAGNOSIS — J45909 Unspecified asthma, uncomplicated: Secondary | ICD-10-CM

## 2013-11-09 DIAGNOSIS — C9000 Multiple myeloma not having achieved remission: Secondary | ICD-10-CM | POA: Diagnosis present

## 2013-11-09 DIAGNOSIS — E1129 Type 2 diabetes mellitus with other diabetic kidney complication: Secondary | ICD-10-CM | POA: Diagnosis present

## 2013-11-09 DIAGNOSIS — N039 Chronic nephritic syndrome with unspecified morphologic changes: Secondary | ICD-10-CM

## 2013-11-09 DIAGNOSIS — M1990 Unspecified osteoarthritis, unspecified site: Secondary | ICD-10-CM

## 2013-11-09 DIAGNOSIS — I10 Essential (primary) hypertension: Secondary | ICD-10-CM | POA: Insufficient documentation

## 2013-11-09 DIAGNOSIS — K219 Gastro-esophageal reflux disease without esophagitis: Secondary | ICD-10-CM | POA: Diagnosis present

## 2013-11-09 DIAGNOSIS — N185 Chronic kidney disease, stage 5: Secondary | ICD-10-CM

## 2013-11-09 DIAGNOSIS — M129 Arthropathy, unspecified: Secondary | ICD-10-CM | POA: Diagnosis present

## 2013-11-09 DIAGNOSIS — Z79899 Other long term (current) drug therapy: Secondary | ICD-10-CM

## 2013-11-09 DIAGNOSIS — N19 Unspecified kidney failure: Secondary | ICD-10-CM | POA: Diagnosis present

## 2013-11-09 DIAGNOSIS — Z794 Long term (current) use of insulin: Secondary | ICD-10-CM

## 2013-11-09 DIAGNOSIS — M899 Disorder of bone, unspecified: Secondary | ICD-10-CM | POA: Diagnosis present

## 2013-11-09 DIAGNOSIS — J449 Chronic obstructive pulmonary disease, unspecified: Secondary | ICD-10-CM

## 2013-11-09 DIAGNOSIS — J4489 Other specified chronic obstructive pulmonary disease: Secondary | ICD-10-CM | POA: Diagnosis present

## 2013-11-09 DIAGNOSIS — E118 Type 2 diabetes mellitus with unspecified complications: Secondary | ICD-10-CM | POA: Diagnosis present

## 2013-11-09 DIAGNOSIS — T82898A Other specified complication of vascular prosthetic devices, implants and grafts, initial encounter: Secondary | ICD-10-CM

## 2013-11-09 DIAGNOSIS — M949 Disorder of cartilage, unspecified: Secondary | ICD-10-CM

## 2013-11-09 DIAGNOSIS — E1149 Type 2 diabetes mellitus with other diabetic neurological complication: Secondary | ICD-10-CM | POA: Diagnosis present

## 2013-11-09 DIAGNOSIS — I120 Hypertensive chronic kidney disease with stage 5 chronic kidney disease or end stage renal disease: Principal | ICD-10-CM | POA: Diagnosis present

## 2013-11-09 DIAGNOSIS — N186 End stage renal disease: Secondary | ICD-10-CM | POA: Diagnosis present

## 2013-11-09 HISTORY — DX: Calculus of kidney: N20.0

## 2013-11-09 HISTORY — DX: Low back pain, unspecified: M54.50

## 2013-11-09 HISTORY — DX: Low back pain: M54.5

## 2013-11-09 HISTORY — DX: Dependence on renal dialysis: Z99.2

## 2013-11-09 HISTORY — DX: Personal history of other medical treatment: Z92.89

## 2013-11-09 HISTORY — DX: Gout, unspecified: M10.9

## 2013-11-09 HISTORY — DX: Other pulmonary embolism without acute cor pulmonale: I26.99

## 2013-11-09 HISTORY — DX: Multiple myeloma not having achieved remission: C90.00

## 2013-11-09 HISTORY — DX: Type 2 diabetes mellitus without complications: E11.9

## 2013-11-09 HISTORY — DX: Pure hypercholesterolemia, unspecified: E78.00

## 2013-11-09 HISTORY — DX: End stage renal disease: N18.6

## 2013-11-09 HISTORY — DX: Other chronic pain: G89.29

## 2013-11-09 LAB — BASIC METABOLIC PANEL
BUN: 149 mg/dL — ABNORMAL HIGH (ref 6–23)
CO2: 27 mEq/L (ref 19–32)
Calcium: 10.5 mg/dL (ref 8.4–10.5)
Chloride: 93 mEq/L — ABNORMAL LOW (ref 96–112)
Creatinine, Ser: 4.83 mg/dL — ABNORMAL HIGH (ref 0.50–1.10)
GFR calc Af Amer: 9 mL/min — ABNORMAL LOW (ref >90)
GFR calc non Af Amer: 8 mL/min — ABNORMAL LOW (ref >90)
Glucose, Bld: 185 mg/dL — ABNORMAL HIGH (ref 70–99)
Potassium: 3.8 mEq/L (ref 3.7–5.3)
Sodium: 139 mEq/L (ref 137–147)

## 2013-11-09 LAB — CBC
HCT: 32.5 % — ABNORMAL LOW (ref 36.0–46.0)
Hemoglobin: 10.9 g/dL — ABNORMAL LOW (ref 12.0–15.0)
MCH: 30.4 pg (ref 26.0–34.0)
MCHC: 33.5 g/dL (ref 30.0–36.0)
MCV: 90.8 fL (ref 78.0–100.0)
PLATELETS: 219 10*3/uL (ref 150–400)
RBC: 3.58 MIL/uL — ABNORMAL LOW (ref 3.87–5.11)
RDW: 18.4 % — AB (ref 11.5–15.5)
WBC: 7.7 10*3/uL (ref 4.0–10.5)

## 2013-11-09 LAB — GLUCOSE, CAPILLARY
Glucose-Capillary: 120 mg/dL — ABNORMAL HIGH (ref 70–99)
Glucose-Capillary: 167 mg/dL — ABNORMAL HIGH (ref 70–99)

## 2013-11-09 LAB — HEPATITIS B SURFACE ANTIGEN: HEP B S AG: NEGATIVE

## 2013-11-09 LAB — PRO B NATRIURETIC PEPTIDE: PRO B NATRI PEPTIDE: 211.9 pg/mL (ref 0–450)

## 2013-11-09 LAB — I-STAT TROPONIN, ED: Troponin i, poc: 0.02 ng/mL (ref 0.00–0.08)

## 2013-11-09 LAB — CBG MONITORING, ED: Glucose-Capillary: 176 mg/dL — ABNORMAL HIGH (ref 70–99)

## 2013-11-09 LAB — HEPATITIS B SURFACE ANTIBODY,QUALITATIVE: Hep B S Ab: NEGATIVE

## 2013-11-09 LAB — HEPATITIS B CORE ANTIBODY, TOTAL: Hep B Core Total Ab: NONREACTIVE

## 2013-11-09 MED ORDER — NEPRO/CARBSTEADY PO LIQD: 237.0000 mL | ORAL | Status: DC | PRN

## 2013-11-09 MED ORDER — ONDANSETRON HCL 4 MG/2ML IJ SOLN: 4.0000 mg | Freq: Four times a day (QID) | INTRAMUSCULAR | Status: DC | PRN

## 2013-11-09 MED ORDER — SODIUM CHLORIDE 0.9 % IV SOLN: 100.0000 mL | INTRAVENOUS | Status: DC | PRN

## 2013-11-09 MED ORDER — ZOLPIDEM TARTRATE 5 MG PO TABS: 5.0000 mg | ORAL_TABLET | Freq: Every evening | ORAL | Status: DC | PRN

## 2013-11-09 MED ORDER — CAMPHOR-MENTHOL 0.5-0.5 % EX LOTN: 1.0000 "application " | TOPICAL_LOTION | Freq: Three times a day (TID) | CUTANEOUS | Status: DC | PRN

## 2013-11-09 MED ORDER — HYDROXYZINE HCL 25 MG PO TABS: 25.0000 mg | ORAL_TABLET | Freq: Three times a day (TID) | ORAL | Status: DC | PRN

## 2013-11-09 MED ORDER — PANTOPRAZOLE SODIUM 40 MG PO TBEC
40.0000 mg | DELAYED_RELEASE_TABLET | Freq: Two times a day (BID) | ORAL | Status: DC
  Administered 2013-11-09 – 2013-11-11 (×4): 40 mg via ORAL
  Filled 2013-11-09 (×4): qty 1

## 2013-11-09 MED ORDER — LIDOCAINE-PRILOCAINE 2.5-2.5 % EX CREA: 1.0000 "application " | TOPICAL_CREAM | CUTANEOUS | Status: DC | PRN

## 2013-11-09 MED ORDER — ALBUTEROL SULFATE (2.5 MG/3ML) 0.083% IN NEBU: 3.0000 mL | INHALATION_SOLUTION | Freq: Two times a day (BID) | RESPIRATORY_TRACT | Status: DC | PRN

## 2013-11-09 MED ORDER — LIDOCAINE HCL (PF) 1 % IJ SOLN: 5.0000 mL | INTRAMUSCULAR | Status: DC | PRN

## 2013-11-09 MED ORDER — ACETAMINOPHEN 650 MG RE SUPP: 650.0000 mg | Freq: Four times a day (QID) | RECTAL | Status: DC | PRN

## 2013-11-09 MED ORDER — SODIUM CHLORIDE 0.9 % IV SOLN
100.0000 mL | INTRAVENOUS | Status: DC | PRN
Start: 2013-11-09 — End: 2013-11-11

## 2013-11-09 MED ORDER — DOCUSATE SODIUM 283 MG RE ENEM
1.0000 | ENEMA | RECTAL | Status: DC | PRN
Start: 2013-11-09 — End: 2013-11-11

## 2013-11-09 MED ORDER — ACETAMINOPHEN 325 MG PO TABS
650.0000 mg | ORAL_TABLET | Freq: Four times a day (QID) | ORAL | Status: DC | PRN
Start: 2013-11-09 — End: 2013-11-11

## 2013-11-09 MED ORDER — PENTAFLUOROPROP-TETRAFLUOROETH EX AERO: 1.0000 "application " | INHALATION_SPRAY | CUTANEOUS | Status: DC | PRN

## 2013-11-09 MED ORDER — ONDANSETRON HCL 4 MG PO TABS
4.0000 mg | ORAL_TABLET | Freq: Four times a day (QID) | ORAL | Status: DC | PRN
  Filled 2013-11-09: qty 1

## 2013-11-09 MED ORDER — HEPARIN SODIUM (PORCINE) 1000 UNIT/ML DIALYSIS: 1000.0000 [IU] | INTRAMUSCULAR | Status: DC | PRN

## 2013-11-09 MED ORDER — INSULIN ASPART 100 UNIT/ML ~~LOC~~ SOLN
0.0000 [IU] | Freq: Three times a day (TID) | SUBCUTANEOUS | Status: DC
  Administered 2013-11-10: 14:00:00 via SUBCUTANEOUS
  Administered 2013-11-11: 3 [IU] via SUBCUTANEOUS
  Administered 2013-11-11: 11:00:00 via SUBCUTANEOUS

## 2013-11-09 MED ORDER — ALLOPURINOL 100 MG PO TABS
100.0000 mg | ORAL_TABLET | Freq: Every day | ORAL | Status: DC
  Administered 2013-11-10 – 2013-11-11 (×2): 100 mg via ORAL
  Filled 2013-11-09 (×3): qty 1

## 2013-11-09 MED ORDER — CALCIUM CARBONATE 1250 MG/5ML PO SUSP: 500.0000 mg | Freq: Four times a day (QID) | ORAL | Status: DC | PRN

## 2013-11-09 MED ORDER — ACYCLOVIR 400 MG PO TABS
400.0000 mg | ORAL_TABLET | Freq: Two times a day (BID) | ORAL | Status: DC
  Administered 2013-11-09 – 2013-11-11 (×4): 400 mg via ORAL
  Filled 2013-11-09 (×5): qty 1

## 2013-11-09 MED ORDER — HEPARIN SODIUM (PORCINE) 1000 UNIT/ML DIALYSIS
20.0000 [IU]/kg | INTRAMUSCULAR | Status: DC | PRN
  Administered 2013-11-09: 2000 [IU] via INTRAVENOUS_CENTRAL
  Filled 2013-11-09: qty 2

## 2013-11-09 MED ORDER — ALTEPLASE 2 MG IJ SOLR: 2.0000 mg | Freq: Once | INTRAMUSCULAR | Status: DC | PRN

## 2013-11-09 MED ORDER — AMLODIPINE BESYLATE 5 MG PO TABS
5.0000 mg | ORAL_TABLET | Freq: Every day | ORAL | Status: DC
  Filled 2013-11-09 (×2): qty 1

## 2013-11-09 MED ORDER — ENOXAPARIN SODIUM 30 MG/0.3ML ~~LOC~~ SOLN
30.0000 mg | SUBCUTANEOUS | Status: DC
  Administered 2013-11-09 – 2013-11-10 (×2): 30 mg via SUBCUTANEOUS
  Filled 2013-11-09 (×3): qty 0.3

## 2013-11-09 MED ORDER — ALTEPLASE 2 MG IJ SOLR: 2.0000 mg | Freq: Once | INTRAMUSCULAR | Status: AC | PRN

## 2013-11-09 MED ORDER — GABAPENTIN 100 MG PO CAPS
100.0000 mg | ORAL_CAPSULE | Freq: Three times a day (TID) | ORAL | Status: DC
  Administered 2013-11-09 – 2013-11-11 (×4): 100 mg via ORAL
  Filled 2013-11-09 (×8): qty 1

## 2013-11-09 MED ORDER — ONDANSETRON HCL 4 MG PO TABS: 8.0000 mg | ORAL_TABLET | Freq: Three times a day (TID) | ORAL | Status: DC | PRN

## 2013-11-09 MED ORDER — HEPARIN SODIUM (PORCINE) 1000 UNIT/ML DIALYSIS: 20.0000 [IU]/kg | INTRAMUSCULAR | Status: DC | PRN

## 2013-11-09 MED ORDER — SORBITOL 70 % SOLN
30.0000 mL | Status: DC | PRN
Start: 2013-11-09 — End: 2013-11-11

## 2013-11-09 MED ORDER — HYDROCODONE-ACETAMINOPHEN 7.5-325 MG PO TABS
1.0000 | ORAL_TABLET | Freq: Four times a day (QID) | ORAL | Status: DC | PRN
  Administered 2013-11-10 – 2013-11-11 (×2): 1 via ORAL
  Filled 2013-11-09 (×2): qty 1

## 2013-11-09 MED ORDER — RENA-VITE PO TABS
1.0000 | ORAL_TABLET | Freq: Every day | ORAL | Status: DC
  Administered 2013-11-10: 13:00:00 via ORAL
  Administered 2013-11-11: 1 via ORAL
  Filled 2013-11-09 (×2): qty 1

## 2013-11-09 MED ORDER — OXYCODONE HCL ER 10 MG PO T12A
20.0000 mg | EXTENDED_RELEASE_TABLET | Freq: Two times a day (BID) | ORAL | Status: DC
  Administered 2013-11-09 – 2013-11-11 (×4): 20 mg via ORAL
  Filled 2013-11-09 (×4): qty 2

## 2013-11-09 NOTE — H&P (Signed)
[name] is a 80 y.o. female with ESRD followed by Dr. Florene Glen. She has been more than needing to start hemodialysis, but has been resistant. She was diagnosed 07/2013 with MM, followed by Dr. Inda Merlin and been receiving Velcade and Decadron.  She thinks some of her feeling badly is related to chemotherapy. She states every time she gets chemotherapy her blood sugar goes up.  Labs 6/5 showed BUN 182 Cr 5.73 CO2 30 K 3.5. Glu 123  She is undergoing the "CLIP" process to get accepted into the [facility] system.  She called the [place] office today c/o SOB and was referred to the ED for evaluation. CXR showed NAD, VS stable. EKG neg. BUN still elevated at 149 Ca 10.5 K 3.8 Cr 4.83 Glu 185 Hgb 10.9.  She denies cough, CP, SOB, N, V, D, fever or chills, but has stomach pains at times. She has not energy and just feels bad. She has some muscle jerking and itching, no problems with urine output or swelling... She is reluctantly agreeable to starting dialysis. She does not want to be on the 5th floor because that was the floor her son was on when she died. She is accompanied by her granddaughter and niece today.  Past Medical History  Diagnosis Date  . Renal failure   . Hypertension   . Diabetes mellitus   . Arthritis   . Asthma   . GERD (gastroesophageal reflux disease)   . Neuropathy in diabetes     Hx: of  . COPD (chronic obstructive pulmonary disease)   . Anemia    Past Surgical History  Procedure Laterality Date  . Lobectomy    . Cholecystectomy    . Appendectomy    . Tubal ligation    . Fistula left arm Left 2011    never used  . Colonoscopy      Hx: of  . Tummy tuck      Hx: of  . Abdominal hysterectomy    . Av fistula placement Left 05/15/2013    Procedure: INSERTION OF ARTERIOVENOUS (AV) GORE-TEX GRAFT ARM-LEFT UPPER ARM;  Surgeon: Mal Misty, MD;  Location: [facility] OR;  Service: Vascular;  Laterality: Left;    Family History  Problem Relation Age of Onset  . Blindness Father   . Diabetes Father   . Diabetes Sister   . Kidney failure Brother    Social History:  reports that she has never smoked. She has never used smokeless tobacco. She reports that she does not drink alcohol or use illicit drugs. Allergies  Allergen Reactions  . Penicillins Cross Reactors Other (See Comments)    Tongue Swelling   Prior to Admission medications   Medication Sig Start Date End Date Taking? Authorizing Provider  acyclovir (ZOVIRAX) 400 MG tablet Take 1 tablet (400 mg total) by mouth 2 (two) times daily. 10/12/13  Yes Curt Bears, MD  albuterol (PROVENTIL HFA;VENTOLIN HFA) 108 (90 BASE) MCG/ACT inhaler Inhale 1 puff into the lungs 2 (two) times daily as needed for wheezing or shortness of breath.   Yes Historical Provider, MD  allopurinol (ZYLOPRIM) 100 MG tablet Take 100 mg by mouth daily with breakfast.    Yes Historical Provider, MD  amLODipine (NORVASC) 5 MG tablet Take 5 mg by mouth daily with breakfast.    Yes Historical Provider, MD  calcitRIOL (ROCALTROL) 0.5 MCG capsule Take 0.5 mcg by mouth daily with breakfast.  Yes Historical Provider, MD  dexamethasone (DECADRON) 4 MG tablet Take 40 mg by mouth once a week. On wednesday   Yes Historical Provider, MD  ferrous sulfate 325 (65 FE) MG tablet Take 325 mg by mouth daily with breakfast.    Yes Historical Provider, MD  furosemide (LASIX) 80 MG tablet Take 80 mg by mouth 2 (two) times daily.    Yes Historical Provider, MD  gabapentin (NEURONTIN) 100 MG capsule Take 100 mg by mouth 3 (three) times daily.   Yes Historical Provider, MD  glimepiride (AMARYL) 1 MG tablet Take 1 mg by mouth daily with breakfast.    Yes Historical Provider, MD  HYDROcodone-acetaminophen (NORCO) 7.5-325 MG per tablet Take 1 tablet by mouth every 6 (six) hours as needed for moderate pain. 05/22/13  Yes Corky Sox, MD  multivitamin (RENA-VIT) TABS tablet Take 1 tablet by mouth  daily.   Yes Historical Provider, MD  ondansetron (ZOFRAN) 8 MG tablet Take 1 tablet (8 mg total) by mouth every 8 (eight) hours as needed for nausea or vomiting. 08/05/13  Yes Curt Bears, MD  Oxycodone HCl 20 MG TABS Take 20 mg by mouth every 12 (twelve) hours.  06/07/13  Yes Historical Provider, MD  pantoprazole (PROTONIX) 40 MG tablet Take 40 mg by mouth 2 (two) times daily.    Yes Historical Provider, MD  saxagliptin HCl (ONGLYZA) 2.5 MG TABS tablet Take 2.5 mg by mouth daily with breakfast.    Yes Historical Provider, MD   Results for orders placed during the hospital encounter of 11/09/13 (from the past 48 hour(s))  CBC     Status: Abnormal   Collection Time    11/09/13 12:00 PM      Result Value Ref Range   WBC 7.7  4.0 - 10.5 K/uL   RBC 3.58 (*) 3.87 - 5.11 MIL/uL   Hemoglobin 10.9 (*) 12.0 - 15.0 g/dL   HCT 32.5 (*) 36.0 - 46.0 %   MCV 90.8  78.0 - 100.0 fL   MCH 30.4  26.0 - 34.0 pg   MCHC 33.5  30.0 - 36.0 g/dL   RDW 18.4 (*) 11.5 - 15.5 %   Platelets 219  150 - 400 K/uL  BASIC METABOLIC PANEL     Status: Abnormal   Collection Time    11/09/13 12:00 PM      Result Value Ref Range   Sodium 139  137 - 147 mEq/L   Potassium 3.8  3.7 - 5.3 mEq/L   Chloride 93 (*) 96 - 112 mEq/L   CO2 27  19 - 32 mEq/L   Glucose, Bld 185 (*) 70 - 99 mg/dL   BUN 149 (*) 6 - 23 mg/dL   Creatinine, Ser 4.83 (*) 0.50 - 1.10 mg/dL   Calcium 10.5  8.4 - 10.5 mg/dL   GFR calc non Af Amer 8 (*) >90 mL/min   GFR calc Af Amer 9 (*) >90 mL/min   Comment: (NOTE)     The eGFR has been calculated using the CKD EPI equation.     This calculation has not been validated in all clinical situations.     eGFR's persistently <90 mL/min signify possible Chronic Kidney     Disease.  PRO B NATRIURETIC PEPTIDE     Status: None   Collection Time    11/09/13 12:00 PM      Result Value Ref Range   Pro B Natriuretic peptide (BNP) 211.9  0 - 450 pg/mL  CBG MONITORING,  ED     Status: Abnormal   Collection Time     11/09/13 12:03 PM      Result Value Ref Range   Glucose-Capillary 176 (*) 70 - 99 mg/dL  I-STAT TROPOININ, ED     Status: None   Collection Time    11/09/13 12:12 PM      Result Value Ref Range   Troponin i, poc 0.02  0.00 - 0.08 ng/mL   Comment 3            Comment: Due to the release kinetics of cTnI,     a negative result within the first hours     of the onset of symptoms does not rule out     myocardial infarction with certainty.     If myocardial infarction is still suspected,     repeat the test at appropriate intervals.   ROS: As per HPI otherwise negative  Physical Exam: Filed Vitals:   11/09/13 1500  BP: 140/77  Pulse: 74  Temp:   Resp: 17     General: Well developed, well nourished, obese NAD Head: Normocephalic, atraumatic, sclera non-icteric, mucus membranes are moist, few missing teeth Neck: Supple. JVD not elevated No LAD Lungs: Clear bilaterally to auscultation without wheezes, rales, or rhonchi. Breathing is unlabored. Heart: RRR with S1 S2. No murmurs, rubs, or gallops appreciated. Abdomen: Soft, non-tender, non-distended with normoactive bowel sounds. No rebound/guarding. No obvious abdominal masses. Lower extremities: without edema or ischemic changes, no open wounds  Neuro: Alert and oriented X 3. Moves all extremities spontaneously; occasional myoclonic jerks noted, some tremor in hands noted but no overt asterxis Psych:  Responds to questions appropriately with a cautious affect Dialysis Access: left upper AVGG + bruit and thrill  Assessment/Plan: 1. Uremia/ ESRD -  HD to start today; explained the process to pt and family about starting at 2.5 hours and titrating time up. She has Emla cream but elects to use the spray today and Emla tomorrow;  Clip process initiated. Anticipate pt will dialyze at [facility].  HD orders written for today and tomorrow. Explained it will take a while before she starts to feel better 3 Hypertension/volume  - titrate  volume gently, change norvasc to HS - already had today/ no overt volume excess 4. Anemia  - no ESA yet; had been receiving; stop oral Fe 5. Metabolic bone disease -  Hypercalcemia - hold calcitriol for now; may come down with dialysis; use 2 Ca bath - with such a low BFR, may not make a big difference;iPTH 74 6/5 -  6. Nutrition - renal carb mod diet  7. DM - hold oral meds today; SSI for now- re-evaluate in am for resuming amaryl and Onglyza; episodes of hyperglycemia likely related to decadron. 8. MM - her last treatment of Velcade was held due to worsening renal function 6/3 - she was referred back to Dr. Florene Glen who saw her 6/5 9. Pain control- admission med list shows she is on oxycodone 20 bid and hydrococone 7.5 qid prn; clarified this with her as it was not on her office med list. This is Rx by Dr. Katherine Roan for arthritis of neck and shoulder; she usually requires one 20 mg oxy per day.  Myriam Jacobson, PA-C [place] [phone] 11/09/2013, 3:22 PM   [name] is admitted with uremic syndrome and needs to start dialysis.  She has a functioning AV access which will be used.  I agree with the above  note as articulated. Estanislado Emms

## 2013-11-09 NOTE — ED Notes (Addendum)
Pt states Dr. Kellie Simmering placed a fistula in 2011 in left forearm but never works. Pt switched to Dr. Florene Glen and he told the pt that she did not need a dialysis at the time. New fistula place last 05/2013 but pt has not done dialysis since.

## 2013-11-09 NOTE — ED Notes (Signed)
Attempted IV stick without success 

## 2013-11-09 NOTE — Progress Notes (Signed)
admit  To  Room 6e 04 From  Home  Via  ED Orientation  To  Room  Call bell  Safety plan/esp use of bed alarm Mental  Status  Alert and oriented  X 4 Living arrangements  Home with family Skin intact     Assessment  See  Doc flow  Sheet

## 2013-11-09 NOTE — Progress Notes (Signed)
Called and spoke with [name] at [employer] and she said for some reason application was still not reviewed. They recd copy of card that was requested. She put on for review. I advised would call bck for status next week.

## 2013-11-09 NOTE — ED Notes (Signed)
Mask in place. Pt is a x 4. Respiration unlabored.

## 2013-11-09 NOTE — ED Notes (Signed)
Patient on floor

## 2013-11-09 NOTE — ED Notes (Signed)
Dr. Pilar Plate at bedside. Requesting consult to Nephrology. Candice-sec made aware.

## 2013-11-09 NOTE — ED Notes (Signed)
IV team paged.  

## 2013-11-09 NOTE — ED Notes (Addendum)
Pt reports sent here from [facility] office, SOB and CP for several months and is intermittent, was told she may need dialysis. Is cancer patient. Pt is a x 4. Told her creatinine was elevated on Friday.

## 2013-11-10 LAB — GLUCOSE, CAPILLARY
GLUCOSE-CAPILLARY: 154 mg/dL — AB (ref 70–99)
Glucose-Capillary: 131 mg/dL — ABNORMAL HIGH (ref 70–99)
Glucose-Capillary: 120 mg/dL — ABNORMAL HIGH (ref 70–99)

## 2013-11-10 LAB — CBC
HCT: 31.5 % — ABNORMAL LOW (ref 36.0–46.0)
Hemoglobin: 10.4 g/dL — ABNORMAL LOW (ref 12.0–15.0)
MCH: 29.9 pg (ref 26.0–34.0)
MCHC: 33 g/dL (ref 30.0–36.0)
MCV: 90.5 fL (ref 78.0–100.0)
Platelets: 226 10*3/uL (ref 150–400)
RBC: 3.48 MIL/uL — ABNORMAL LOW (ref 3.87–5.11)
RDW: 18.2 % — ABNORMAL HIGH (ref 11.5–15.5)
WBC: 8.1 10*3/uL (ref 4.0–10.5)

## 2013-11-10 LAB — RENAL FUNCTION PANEL
Albumin: 3.6 g/dL (ref 3.5–5.2)
BUN: 80 mg/dL — ABNORMAL HIGH (ref 6–23)
CO2: 30 mEq/L (ref 19–32)
Calcium: 9.5 mg/dL (ref 8.4–10.5)
Chloride: 98 mEq/L (ref 96–112)
Creatinine, Ser: 3.66 mg/dL — ABNORMAL HIGH (ref 0.50–1.10)
GFR calc Af Amer: 13 mL/min — ABNORMAL LOW (ref >90)
GFR calc non Af Amer: 11 mL/min — ABNORMAL LOW (ref >90)
Glucose, Bld: 99 mg/dL (ref 70–99)
Phosphorus: 3.8 mg/dL (ref 2.3–4.6)
Potassium: 3.4 mEq/L — ABNORMAL LOW (ref 3.7–5.3)
Sodium: 143 mEq/L (ref 137–147)

## 2013-11-10 MED ORDER — NEPRO/CARBSTEADY PO LIQD
237.0000 mL | Freq: Every day | ORAL | Status: DC
  Administered 2013-11-10 – 2013-11-11 (×2): 237 mL via ORAL

## 2013-11-10 NOTE — Progress Notes (Signed)
INITIAL NUTRITION ASSESSMENT  DOCUMENTATION CODES Per approved criteria  -Obesity Unspecified   INTERVENTION: Nepro Shake po daily, each supplement provides 425 kcal and 19 grams protein  NUTRITION DIAGNOSIS: Inadequate oral intake related to decreased appetite due to chemotherapy as evidenced by poor appetite PTA, 7.3% wt loss x 1 month.   Goal: Pt will meet >90% of estimated nutritional needs  Monitor:  PO/ supplement intake, labs, weight changes, I/O's  Reason for Assessment: MST=2  80 y.o. female  Admitting Dx: <principal problem not specified>  ASSESSMENT: Pt admitted with uremia and ESRD. Pt had been resistant to start HD, but received her first treatment yesterday, per her report, and reports feeling better.  She reports a decreased appetite as a result of chemotherapy. She reports to eating very little protein other than chicken and fish, which she reports she eats in small pieces for ease of intake. She declined RD offer of a diet downgrade.  Wt hx reveals 7.7% wt loss x 3 months, 6.5% wt loss x 3 months, and 7.3% wt loss x 1 month, which is clinically significant, however fluid status may be a contributing factor in weight loss.  She reports she drinks 2-4 cans of Boost at home depending upon her appetite. She is agreeable to try Nepro during this hospitalization. Pt exhibits no physical signs of malnutrition on her temple, orbital, or clavicle areas.   Height: Ht Readings from Last 1 Encounters:  11/09/13 $RemoveB'5\' 5"'PcjdBssH$  (1.651 m)    Weight: Wt Readings from Last 1 Encounters:  11/10/13 202 lb 13.2 oz (92 kg)    Ideal Body Weight: 125#  % Ideal Body Weight: 162%  Wt Readings from Last 10 Encounters:  11/10/13 202 lb 13.2 oz (92 kg)  10/28/13 215 lb 12.8 oz (97.886 kg)  10/07/13 218 lb 3.2 oz (98.975 kg)  09/16/13 221 lb 9.6 oz (100.517 kg)  08/26/13 223 lb 3.2 oz (101.243 kg)  08/05/13 216 lb 3.2 oz (98.068 kg)  07/15/13 230 lb 3.2 oz (104.418 kg)  06/22/13 224  lb 4.8 oz (101.742 kg)  06/08/13 219 lb (99.338 kg)  05/26/13 219 lb 8 oz (99.565 kg)    Usual Body Weight: 225#  % Usual Body Weight: 90%  BMI:  Body mass index is 33.75 kg/(m^2). Meets criteria for obesity, class I.   Estimated Nutritional Needs: Kcal: 6237-6283 Protein: 76-95 grams Fluid: < 1L  Skin: WDL  Diet Order: Diabetic, Renal with 1.2 L fluid restriction  EDUCATION NEEDS: -Education needs addressed   Intake/Output Summary (Last 24 hours) at 11/10/13 1227 Last data filed at 11/10/13 1044  Gross per 24 hour  Intake      0 ml  Output   1299 ml  Net  -1299 ml    Last BM: 11/08/13   Labs:   Recent Labs Lab 11/04/13 1251 11/09/13 1200 11/10/13 0748  NA 142 139 143  K 3.8 3.8 3.4*  CL  --  93* 98  CO2 $Re'25 27 30  'mrE$ BUN 156.4* 149* 80*  CREATININE 6.8* 4.83* 3.66*  CALCIUM 10.9* 10.5 9.5  PHOS  --   --  3.8  GLUCOSE 229* 185* 99    CBG (last 3)   Recent Labs  11/09/13 1740 11/09/13 2241 11/10/13 1129  GLUCAP 120* 167* 131*    Scheduled Meds: . acyclovir  400 mg Oral BID  . allopurinol  100 mg Oral Q breakfast  . amLODipine  5 mg Oral QHS  . enoxaparin (LOVENOX) injection  30 mg  Subcutaneous Q24H  . gabapentin  100 mg Oral TID  . insulin aspart  0-9 Units Subcutaneous TID WC  . multivitamin  1 tablet Oral Daily  . OxyCODONE  20 mg Oral Q12H  . pantoprazole  40 mg Oral BID    Continuous Infusions:   Past Medical History  Diagnosis Date  . Hypertension   . Asthma   . GERD (gastroesophageal reflux disease)   . Neuropathy in diabetes     Hx: of  . COPD (chronic obstructive pulmonary disease)   . Anemia   . High cholesterol   . Pulmonary embolism 1987  . Type II diabetes mellitus   . History of blood transfusion 1987; 2011    "related to lung OR; HgB went down to 5"  . Arthritis     "hips, knees, shoulders" (11/10/2013)  . Chronic lower back pain   . Gout   . ESRD (end stage renal disease) on dialysis     "1st treatment 11/09/2013"  .  Kidney stones   . Multiple myeloma     Past Surgical History  Procedure Laterality Date  . Lung removal, partial Left 1987  . Cholecystectomy    . Av fistula placement Left 2011    "lower arm; never used"  . Colonoscopy    . Abdominoplasty  ~ 1983    "tummy tuck"  . Abdominal hysterectomy  1964  . Av fistula placement Left 05/15/2013    Procedure: INSERTION OF ARTERIOVENOUS (AV) GORE-TEX GRAFT ARM-LEFT UPPER ARM;  Surgeon: Mal Misty, MD;  Location: [facility];  Service: Vascular;  Laterality: Left;  . Appendectomy  1954  . Umbilical hernia repair  ~ 1983  . Hernia repair    . Tubal ligation  1964  . Cystoscopy w/ stone manipulation  1980's    Shadonna Benedick A. Jimmye Norman, RD, LDN Pager: [phone] After hours Pager: [phone]

## 2013-11-10 NOTE — Procedures (Signed)
Second dialysis treatment and she is doing well and feels better after dialysis last night. Estanislado Emms

## 2013-11-10 NOTE — Progress Notes (Signed)
Hemodialysis RN informed night RN that dialysis room has "water problems," and therefore unable to perform dialysis treatment there.  Patient will be having dialysis treatment in room.  Will continue to monitor.

## 2013-11-11 ENCOUNTER — Other Ambulatory Visit: Payer: Medicare Other

## 2013-11-11 ENCOUNTER — Ambulatory Visit: Payer: Medicare Other

## 2013-11-11 LAB — GLUCOSE, CAPILLARY
GLUCOSE-CAPILLARY: 248 mg/dL — AB (ref 70–99)
Glucose-Capillary: 171 mg/dL — ABNORMAL HIGH (ref 70–99)

## 2013-11-11 NOTE — Discharge Instructions (Signed)
End-Stage Kidney Disease The kidneys are two organs that lie on either side of the spine between the middle of the back and the front of the abdomen. The kidneys:   Remove wastes and extra water from the blood.   Produce important hormones. These help keep bones strong, regulate blood pressure, and help create red blood cells.   Balance the fluids and chemicals in the blood and tissues. End-stage kidney disease occurs when the kidneys are so damaged that they cannot do their job. When the kidneys cannot do their job, life-threatening problems occur. The body cannot stay clean and strong without the help of the kidneys. In end-stage kidney disease, the kidneys cannot get better.You need a new kidney or treatments to do some of the work healthy kidneys do in order to stay alive. CAUSES  End-stage kidney disease usually occurs when a long-lasting (chronic) kidney disease gets worse. It may also occur after the kidneys are suddenly damaged (acute kidney injury).  SYMPTOMS   Swelling (edema) of the legs, ankles, or feet.   Tiredness (lethargy).   Nausea or vomiting.   Confusion.   Problems with urination, such as:   Decreased urine production.   Frequent urination, especially at night.   Frequent accidents in children who are potty trained.   Muscle twitches and cramps.   Persistent itchiness.   Loss of appetite.   Headaches.   Abnormally dark or light skin.   Numbness in the hands or feet.   Easy bruising.   Frequent hiccups.   Menstruation stops. DIAGNOSIS  Your caregiver will measure your blood pressure and take some tests. These may include:   Urine tests.   Blood tests.   Imaging tests, such as:   An ultrasound exam.   Computed tomography (CT).  A kidney biopsy. TREATMENT  There are two treatments for end-stage kidney disease:   A procedure that removes toxic wastes from the body (dialysis).   Receiving a new kidney (kidney  transplant). Both of these treatments have serious risks and consequences. Your caregiver will help you determine which treatment is best for you based on your health, age, and other factors. In addition to having dialysis or a kidney transplant, you may need to take medicines to control high blood pressure (hypertension) and cholesterol and to decrease phosphorus levels in your blood.  HOME CARE INSTRUCTIONS  Follow your prescribed diet.   Only take over-the-counter or prescription medicines as directed by your caregiver.   Do not take any new medicines (prescription, over-the-counter, or nutritional supplements) unless approved by your caregiver. Many medicines can worsen your kidney damage or need to have the dose adjusted.   Keep all follow-up appointments. MAKE SURE YOU:  Understand these instructions.  Will watch your condition.  Will get help right away if you are not doing well or get worse Document Released: 08/11/2003 Document Revised: 05/07/2012 Document Reviewed: 01/18/2012 [facility] Patient Information 2014 [place].  Hemodialysis Hemodialysis is a way of removing wastes, salt, and extra water from your blood. It is done when your kidneys cannot keep the blood clean. During hemodialysis, your blood travels outside of your body to a machine. A filter in the machine cleans the blood. Hemodialysis is usually done 3 times a week. Visits last 3 5 hours. BEFORE THE PROCEDURE An opening must be made to allow blood to be taken from your body and put back into your body (access). It is made weeks or months before you start hemodialysis. It is usually made  in your arm. If you need hemodialysis right away, a thin, flexible tube (catheter) will be placed in your neck, chest, or groin. This type of access is usually temporary. PROCEDURE Hemodialysis is done while you are sitting or leaning back. During hemodialysis you may do any activity as long as you stay sitting or leaning  back. Many people sleep, watch television, or read. If you have side effects or feel uncomfortable, tell your doctor. Your doctor may make changes to help you feel more comfortable. Your hemodialysis visits may look like this: 1. You will be weighed and your temperature will be taken. Your blood pressure and pulse will be checked. 2. The skin around your access will be cleaned. 3. Two needles will be put into the access. They will be connected to a plastic tube. The needles will be taped to your skin so that they do not move. If you have a temporary access, your catheter will be connected to a plastic tube. 4. Your blood will go through the tube to the machine. The machine will clean your blood. Then your blood will go back to your body. Your blood pressure and pulse will be checked a few times. 5. Once the procedure is complete, the needles will be removed. A bandage (dressing) will be placed over the access. If your access is a catheter, it will be disconnected. AFTER THE PROCEDURE  You will be weighed.  Your blood will be tested. This is usually done once a month.  You may have side effects. These include:  Dizziness.  Muscle cramps.  Feeling sick to your stomach (nausea).  Headaches.  Feeling tired. You usually feel normal the next day.  Itchiness. Your doctor may give medicine to help with this.   Achy or jittery legs. You may feel like kicking your legs. This can cause sleeping problems.   Allergic reaction. MAKE SURE YOU:  Understand these instructions.  Will watch your condition.  Will get help right away if you are not doing well or get worse. Document Released: 05/03/2008 Document Revised: 09/15/2012 Document Reviewed: 07/07/2012 [facility] Patient Information 2014 [place].

## 2013-11-11 NOTE — Progress Notes (Signed)
Hemodialysis Outpatient Note: Pt has been accepted to [facility] on a TTS schedule with chair time 11:45am. Pt is to start at center tomorrow on November 12, 2013 at 11:15 to sign paperwork. Information has been given to pt.

## 2013-11-11 NOTE — Discharge Summary (Signed)
Physician Discharge Summary  Patient ID: [name] MRN: [id number] DOB/AGE: 1932/11/22 80 y.o.  Admit date: 11/09/2013 Discharge date: 11/11/2013  Discharge Diagnoses: 1. Uremia related to ESRD 2. Diabetes Mellitus 3. Hypertension 4. Secondary Hyperparathyroidism 5. Anemia 6. Multiple Myeloma 7. Arthritis  Treatments:   Hemodialysis 6/8 and 6/9  HPI:  [name] is a 80 y.o.AA female with ESRD followed by Dr. Lowell Guitar. She has been more than needing to start hemodialysis, but has been resistant. She was diagnosed 07/2013 with MM, followed by Dr. Gwenyth Bouillon and been receiving Velcade and Decadron. She thinks some of her feeling badly is related to chemotherapy. She states every time she gets chemotherapy her blood sugar goes up. Labs 6/5 showed BUN 182 Cr 5.73 CO2 30 K 3.5. Glu 123 She had undergoing the "CLIP" process to get accepted into the [facility] system. She called the [facility] office the day of admission c/o SOB and was referred to the ED for evaluation. CXR showed NAD, VS stable. EKG neg. BUN still elevated at 149 Ca 10.5 K 3.8 Cr 4.83 Glu 185 Hgb 10.9. She denies cough, CP, SOB, N, V, D, fever or chills, but has stomach pains at times. She has not energy and just feels bad. She has some muscle jerking and itching, no problems with urine output or swelling... She is reluctantly agreeable to starting dialysis. She does not want to be on the 5th floor because that was the floor her son was on when she died. She is accompanied by her granddaughter and niece today.  Hospital Course: 8. Uremia related to ESRD - she underwent two serial HD treatments with slow correction of BUN.  BUN down to 80 from 149 at discharge and Cr down to 3.66.  Outpatient dialysis plans were finalized and she will begin outpatient HD at [facility] on 6/11.  Time and BFR will be titrated up with improved kinetics.  Pt was feeling much better at the time of discharges even though her numbers hadn't improved that  significantly.  She will be started on a 3 K bath. Her K level will be rechecked next week, if it is into the 4s, she can be changed to a 2 K bath 9. Diabetes Mellitus - diabetes meds were placed on hold and she was given SSI.  Diabetes meds will be resumed at discharge as she will likely be eating differently at home especially as uremia is resolved. 10. Hypertension - BP on the low side - it may be that her norvasc can discontinued; will follow trends at outpatient dialysis center. 11. Secondary Hyperparathyroidism - Calcium level was high at 10.5 6/5.  Her calcitriol was placed on hold iTPH was 74 6/5. She is not currently on binders; iPTH will be rechecked with June labs and it can be decided then whether to resume vitamin D. Ca on 6/9 was 9.5 with P of 3.8. 12. Anemia - Hgb was 10.9 6/8, 10.4 6/9 - She had been receiving Aranesp 200  Monthly via the cancer center and last received it 5/11. She had been taking oral iron; Her most recent iron studies were 5/11 with 13% sat and ferritin of 1381. I will resume Aranesp 60 per week and we can recheck Fe studies with June labs.- She needs to stop getting outpatient Aranesp now that hemodialysis has been initiated. 13. Multiple Myeloma - Her chemotherapy was cancelled last week due to worsening uremia.  I think she should be ok to resume treatment next week with  Dr. Gwenyth Bouillon. 14. Arthritis - has been managed by Dr. Petra Kuba with oxycodone 20 bid prn and hydrocodone 7.5 q 6 hours prn. We will defer management to him.    Diet: Renal carb mod diet limit fluids to 1200 cc per day  Discharge Medications:    Medication List    STOP taking these medications       ferrous sulfate 325 (65 FE) MG tablet     furosemide 80 MG tablet  Commonly known as:  LASIX      TAKE these medications       acyclovir 400 MG tablet  Commonly known as:  ZOVIRAX  Take 1 tablet (400 mg total) by mouth 2 (two) times daily.     albuterol 108 (90 BASE) MCG/ACT inhaler   Commonly known as:  PROVENTIL HFA;VENTOLIN HFA  Inhale 1 puff into the lungs 2 (two) times daily as needed for wheezing or shortness of breath.     allopurinol 100 MG tablet  Commonly known as:  ZYLOPRIM  Take 100 mg by mouth daily with breakfast.     amLODipine 5 MG tablet  Commonly known as:  NORVASC  Take 5 mg by mouth daily with breakfast.     dexamethasone 4 MG tablet  Commonly known as:  DECADRON  Take 40 mg by mouth once a week. On wednesday     gabapentin 100 MG capsule  Commonly known as:  NEURONTIN  Take 100 mg by mouth 3 (three) times daily.     glimepiride 1 MG tablet  Commonly known as:  AMARYL  Take 1 mg by mouth daily with breakfast.     HYDROcodone-acetaminophen 7.5-325 MG per tablet  Commonly known as:  NORCO  Take 1 tablet by mouth every 6 (six) hours as needed for moderate pain.     multivitamin Tabs tablet  Take 1 tablet by mouth daily.     ondansetron 8 MG tablet  Commonly known as:  ZOFRAN  Take 1 tablet (8 mg total) by mouth every 8 (eight) hours as needed for nausea or vomiting.     Oxycodone HCl 20 MG Tabs  Take 20 mg by mouth every 12 (twelve) hours.     pantoprazole 40 MG tablet  Commonly known as:  PROTONIX  Take 40 mg by mouth 2 (two) times daily.     saxagliptin HCl 2.5 MG Tabs tablet  Commonly known as:  ONGLYZA  Take 2.5 mg by mouth daily with breakfast.      ASK your doctor about these medications       calcitRIOL 0.5 MCG capsule  Commonly known as:  ROCALTROL  Take 0.5 mcg by mouth daily with breakfast.        Disposition: patient to be discharged to home     Discharge Instructions   Activity as tolerated    Complete by:  As directed      Bring all medications to your doctor's appointment    Complete by:  As directed      CALL 911 for chest discomfort not relieved by NTG or lasting longer than 20 minutes    Complete by:  As directed      Call doctor for chest discomfort that is more frequent or severe    Complete by:   As directed      Call doctor for fainting or near blackouts    Complete by:  As directed      Call doctor for shortness of breath, with or without  a dry hacking cough    Complete by:  As directed      Call doctor for swellling in the hands, feet or stomach not improved after hemodialysis    Complete by:  As directed      Call doctor if you have to sleep on extra pillows at night in order to breathe    Complete by:  As directed      Continue to follow the Renal Care Notes you received during your hospital stay    Complete by:  As directed      Diet Carb Modified    Complete by:  As directed      Do not skip any hemodialysis appointments unless directed by your doctor    Complete by:  As directed      No Wound Care    Complete by:  As directed      STOP ANY ACTIVITY THAT CAUSES CHEST PAIN, SHORTNESS OF BREATH, DIZZINESS, SWEATING OR EXCESSIVE WEAKNESS    Complete by:  As directed            Follow-up Information   Follow up with [place] .   Contact information:   11: 15 am Thursday June 11 Bring a blanket and pillow Call [phone] if any questions      Discharge Vital Signs and Labs: Temp:  [98.2 F (36.8 C)-99 F (37.2 C)] 98.2 F (36.8 C) (06/10 0931) Pulse Rate:  [70-80] 80 (06/10 0931) Resp:  [18] 18 (06/10 0931) BP: (100-113)/(44-68) 111/68 mmHg (06/10 0931) SpO2:  [96 %-98 %] 96 % (06/10 0931) Weight:  [94.394 kg (208 lb 1.6 oz)] 94.394 kg (208 lb 1.6 oz) (06/10 0500)  Recent Labs  11/09/13 1200 11/10/13 0747  WBC 7.7 8.1  HGB 10.9* 10.4*  HCT 32.5* 31.5*  PLT 219 226   Renal:  Recent Labs  11/09/13 1200 11/10/13 0748  NA 139 143  K 3.8 3.4*  CL 93* 98  CO2 27 30  GLUCOSE 185* 99  BUN 149* 80*  CREATININE 4.83* 3.66*  CALCIUM 10.5 9.5  PHOS  --  3.8  ALBUMIN  --  3.6   40 minutes were spent completing this discharge summary, discharge med reconciliation, discharge patient instructions and communicating discharge information  to the patient's dialysis center.  [place], [facility] Beeper [phone] 11/11/2013, 1:09 PM  Attending Physician: Dr. Erling [facility] C

## 2013-11-11 NOTE — Progress Notes (Signed)
Assess: New Start ESRD due to DM                M. Myeloma     Diabetes M Plan: await OP assignment for dialysis  Subjective: Interval History: s/p 2 HD treatments  Objective: Vital signs in last 24 hours: Temp:  [98.6 F (37 C)-99 F (37.2 C)] 99 F (37.2 C) (06/09 2100) Pulse Rate:  [64-74] 74 (06/09 2100) Resp:  [18] 18 (06/09 2100) BP: (100-124)/(44-70) 100/44 mmHg (06/09 2100) SpO2:  [96 %-98 %] 98 % (06/09 2100) Weight:  [92 kg (202 lb 13.2 oz)-94.394 kg (208 lb 1.6 oz)] 94.394 kg (208 lb 1.6 oz) (06/10 0500) Weight change: -4.523 kg (-9 lb 15.6 oz)  Intake/Output from previous day: 06/09 0701 - 06/10 0700 In: -  Out: 995  Intake/Output this shift:    General appearance: alert and cooperative Lungs clear Cor RRR no rub No myoclonus  Lab Results:  Recent Labs  11/09/13 1200 11/10/13 0747  WBC 7.7 8.1  HGB 10.9* 10.4*  HCT 32.5* 31.5*  PLT 219 226   BMET:  Recent Labs  11/09/13 1200 11/10/13 0748  NA 139 143  K 3.8 3.4*  CL 93* 98  CO2 27 30  GLUCOSE 185* 99  BUN 149* 80*  CREATININE 4.83* 3.66*  CALCIUM 10.5 9.5   No results found for this basename: PTH,  in the last 72 hours Iron Studies: No results found for this basename: IRON, TIBC, TRANSFERRIN, FERRITIN,  in the last 72 hours Studies/Results: Dg Chest 2 View  11/09/2013   CLINICAL DATA:  80 year old female with shortness of breath and flank pain, dizziness. Chest pain. Initial encounter.  EXAM: CHEST  2 VIEW  COMPARISON:  Bone survey 07/01/2013. Chest radiographs 05/15/2013 and earlier.  FINDINGS: Chronic elevation of the left hemidiaphragm is stable along with bilateral mild costophrenic angle blunting. Stable lung volumes. No pneumothorax or pulmonary edema. No acute pleural effusion or pulmonary opacity. Stable cardiac size and mediastinal contours. Visualized tracheal air column is within normal limits. Stable visualized osseous structures. Stable right upper quadrant surgical clips.   IMPRESSION: No acute cardiopulmonary abnormality.   Electronically Signed   By: Lars Pinks M.D.   On: 11/09/2013 12:49   Scheduled: . acyclovir  400 mg Oral BID  . allopurinol  100 mg Oral Q breakfast  . amLODipine  5 mg Oral QHS  . enoxaparin (LOVENOX) injection  30 mg Subcutaneous Q24H  . feeding supplement (NEPRO CARB STEADY)  237 mL Oral Daily  . gabapentin  100 mg Oral TID  . insulin aspart  0-9 Units Subcutaneous TID WC  . multivitamin  1 tablet Oral Daily  . OxyCODONE  20 mg Oral Q12H  . pantoprazole  40 mg Oral BID    LOS: 2 days   Rance Smithson C 11/11/2013,9:03 AM

## 2013-11-12 NOTE — Care Management Note (Signed)
CARE MANAGEMENT NOTE 11/12/2013  Patient:  [name], [name]   Account Number:  [id number]  Date Initiated:  11/11/2013  Documentation initiated by:  Noam Karaffa  Subjective/Objective Assessment:   Referral for possible [facility] aide services.     Action/Plan:   11/11/2013 Met with pt , who has AARP Medicare Complete, no Medicaid. Explained to pt that [place] and [facility] are available only to pt with Medicaid benefits.   Anticipated DC Date:  11/13/2013   Anticipated DC Plan:  HOME/SELF CARE  In-house referral  Clinical Social Worker         Choice offered to / List presented to:             Status of service:  Completed, signed off Medicare Important Message given?  NA - LOS <3 / Initial given by admissions (If response is "NO", the following Medicare IM given date fields will be blank) Date Medicare IM given:   Date Additional Medicare IM given:    Discharge Disposition:    Per UR Regulation:    If discussed at Long Length of Stay Meetings, dates discussed:    Comments:  11/12/2013 Discussion of [facility] as optional transportation to HD center discussed and application provided by V Crawford CSW, pt given her part of the application as she was d/c earlier than anticipated. CSW and CM to complete "professional" portion of application and mail to the pt at her home to complete the application progess. Jasmine Pang RN MPH, case manager, [phone]

## 2013-11-13 NOTE — ED Provider Notes (Signed)
CSN: [id number]     Arrival date & time 11/09/13  1143 History   First MD Initiated Contact with Patient 11/09/13 1318     Chief Complaint  Patient presents with  . Shortness of Breath  . Chest Pain     (Consider location/radiation/quality/duration/timing/severity/associated sxs/prior Treatment) HPI Comments: 80 y.o. female with ESRD, needing to start hemodialysis, niddm, copd comes in with cc of dib. Reports that she has been having some dib. DIB is exertional - and has been present for few months. Chest pain is midsternal, intermittent, and present since 1987. The DIB is getting worse. Pt also is feeling more and more malaise/and tired. No fevers, chills, palpations.  Patient is a 80 y.o. female presenting with shortness of breath and chest pain. The history is provided by the patient.  Shortness of Breath Associated symptoms: chest pain   Associated symptoms: no abdominal pain, no cough, no neck pain, no vomiting and no wheezing   Chest Pain Associated symptoms: fatigue, shortness of breath and weakness   Associated symptoms: no abdominal pain, no cough, no nausea and not vomiting     Past Medical History  Diagnosis Date  . Hypertension   . Asthma   . GERD (gastroesophageal reflux disease)   . Neuropathy in diabetes     Hx: of  . COPD (chronic obstructive pulmonary disease)   . Anemia   . High cholesterol   . Pulmonary embolism 1987  . Type II diabetes mellitus   . History of blood transfusion 1987; 2011    "related to lung OR; HgB went down to 5"  . Arthritis     "hips, knees, shoulders" (11/10/2013)  . Chronic lower back pain   . Gout   . ESRD (end stage renal disease) on dialysis     "1st treatment 11/09/2013"  . Kidney stones   . Multiple myeloma    Past Surgical History  Procedure Laterality Date  . Lung removal, partial Left 1987  . Cholecystectomy    . Av fistula placement Left 2011    "lower arm; never used"  . Colonoscopy    . Abdominoplasty  ~ 1983   "tummy tuck"  . Abdominal hysterectomy  1964  . Av fistula placement Left 05/15/2013    Procedure: INSERTION OF ARTERIOVENOUS (AV) GORE-TEX GRAFT ARM-LEFT UPPER ARM;  Surgeon: Mal Misty, MD;  Location: [facility];  Service: Vascular;  Laterality: Left;  . Appendectomy  1954  . Umbilical hernia repair  ~ 1983  . Hernia repair    . Tubal ligation  1964  . Cystoscopy w/ stone manipulation  1980's   Family History  Problem Relation Age of Onset  . Blindness Father   . Diabetes Father   . Diabetes Sister   . Kidney failure Brother    History  Substance Use Topics  . Smoking status: Never Smoker   . Smokeless tobacco: Never Used  . Alcohol Use: No   OB History   Grav Para Term Preterm Abortions TAB SAB Ect Mult Living                 Review of Systems  Constitutional: Positive for fatigue. Negative for activity change.  HENT: Negative for facial swelling.   Respiratory: Positive for shortness of breath. Negative for cough and wheezing.   Cardiovascular: Positive for chest pain.  Gastrointestinal: Negative for nausea, vomiting, abdominal pain, diarrhea, constipation, blood in stool and abdominal distention.  Genitourinary: Negative for hematuria and difficulty urinating.  Musculoskeletal: Negative  for neck pain.  Skin: Negative for color change.  Neurological: Positive for weakness. Negative for speech difficulty.  Hematological: Does not bruise/bleed easily.  Psychiatric/Behavioral: Negative for confusion.      Allergies  Penicillins cross reactors  Home Medications   Prior to Admission medications   Medication Sig Start Date End Date Taking? Authorizing Provider  acyclovir (ZOVIRAX) 400 MG tablet Take 1 tablet (400 mg total) by mouth 2 (two) times daily. 10/12/13  Yes Curt Bears, MD  albuterol (PROVENTIL HFA;VENTOLIN HFA) 108 (90 BASE) MCG/ACT inhaler Inhale 1 puff into the lungs 2 (two) times daily as needed for wheezing or shortness of breath.   Yes Historical  Provider, MD  allopurinol (ZYLOPRIM) 100 MG tablet Take 100 mg by mouth daily with breakfast.    Yes Historical Provider, MD  amLODipine (NORVASC) 5 MG tablet Take 5 mg by mouth daily with breakfast.    Yes Historical Provider, MD  dexamethasone (DECADRON) 4 MG tablet Take 40 mg by mouth once a week. On wednesday   Yes Historical Provider, MD  gabapentin (NEURONTIN) 100 MG capsule Take 100 mg by mouth 3 (three) times daily.   Yes Historical Provider, MD  glimepiride (AMARYL) 1 MG tablet Take 1 mg by mouth daily with breakfast.    Yes Historical Provider, MD  HYDROcodone-acetaminophen (NORCO) 7.5-325 MG per tablet Take 1 tablet by mouth every 6 (six) hours as needed for moderate pain. 05/22/13  Yes Corky Sox, MD  multivitamin (RENA-VIT) TABS tablet Take 1 tablet by mouth daily.   Yes Historical Provider, MD  ondansetron (ZOFRAN) 8 MG tablet Take 1 tablet (8 mg total) by mouth every 8 (eight) hours as needed for nausea or vomiting. 08/05/13  Yes Curt Bears, MD  Oxycodone HCl 20 MG TABS Take 20 mg by mouth every 12 (twelve) hours.  06/07/13  Yes Historical Provider, MD  pantoprazole (PROTONIX) 40 MG tablet Take 40 mg by mouth 2 (two) times daily.    Yes Historical Provider, MD  saxagliptin HCl (ONGLYZA) 2.5 MG TABS tablet Take 2.5 mg by mouth daily with breakfast.    Yes Historical Provider, MD   BP 117/50  Pulse 87  Temp(Src) 98.9 F (37.2 C) (Oral)  Resp 18  Ht _0  (1.651 m)  Wt 208 lb 1.6 oz (94.394 kg)  BMI 34.63 kg/m2  SpO2 96% Physical Exam  Nursing note and vitals reviewed. Constitutional: She is oriented to person, place, and time. She appears well-developed.  HENT:  Head: Normocephalic and atraumatic.  Eyes: Conjunctivae and EOM are normal. Pupils are equal, round, and reactive to light.  Neck: Normal range of motion. Neck supple.  Cardiovascular: Normal rate, regular rhythm and normal heart sounds.   Pulmonary/Chest: Effort normal and breath sounds normal. No respiratory  distress.  Abdominal: Soft. Bowel sounds are normal. She exhibits no distension. There is no tenderness. There is no rebound and no guarding.  Neurological: She is alert and oriented to person, place, and time.  Skin: Skin is warm and dry.    ED Course  Procedures (including critical care time) Labs Review Labs Reviewed  CBC - Abnormal; Notable for the following:    RBC 3.58 (*)    Hemoglobin 10.9 (*)    HCT 32.5 (*)    RDW 18.4 (*)    All other components within normal limits  BASIC METABOLIC PANEL - Abnormal; Notable for the following:    Chloride 93 (*)    Glucose, Bld 185 (*)  BUN 149 (*)    Creatinine, Ser 4.83 (*)    GFR calc non Af Amer 8 (*)    GFR calc Af Amer 9 (*)    All other components within normal limits  GLUCOSE, CAPILLARY - Abnormal; Notable for the following:    Glucose-Capillary 120 (*)    All other components within normal limits  GLUCOSE, CAPILLARY - Abnormal; Notable for the following:    Glucose-Capillary 167 (*)    All other components within normal limits  CBC - Abnormal; Notable for the following:    RBC 3.48 (*)    Hemoglobin 10.4 (*)    HCT 31.5 (*)    RDW 18.2 (*)    All other components within normal limits  RENAL FUNCTION PANEL - Abnormal; Notable for the following:    Potassium 3.4 (*)    BUN 80 (*)    Creatinine, Ser 3.66 (*)    GFR calc non Af Amer 11 (*)    GFR calc Af Amer 13 (*)    All other components within normal limits  GLUCOSE, CAPILLARY - Abnormal; Notable for the following:    Glucose-Capillary 131 (*)    All other components within normal limits  GLUCOSE, CAPILLARY - Abnormal; Notable for the following:    Glucose-Capillary 120 (*)    All other components within normal limits  GLUCOSE, CAPILLARY - Abnormal; Notable for the following:    Glucose-Capillary 154 (*)    All other components within normal limits  GLUCOSE, CAPILLARY - Abnormal; Notable for the following:    Glucose-Capillary 171 (*)    All other components  within normal limits  GLUCOSE, CAPILLARY - Abnormal; Notable for the following:    Glucose-Capillary 248 (*)    All other components within normal limits  CBG MONITORING, ED - Abnormal; Notable for the following:    Glucose-Capillary 176 (*)    All other components within normal limits  PRO B NATRIURETIC PEPTIDE  HEPATITIS B SURFACE ANTIBODY  HEPATITIS B SURFACE ANTIGEN  HEPATITIS B CORE ANTIBODY, TOTAL  I-STAT TROPOININ, ED    Imaging Review No results found.   EKG Interpretation   Date/Time:  Monday November 09 2013 11:50:33 EDT Ventricular Rate:  79 PR Interval:  136 QRS Duration: 88 QT Interval:  398 QTC Calculation: 456 R Axis:   -5 Text Interpretation:  Normal sinus rhythm Left ventricular hypertrophy  Abnormal ECG No acute changes Confirmed by Kathrynn Humble, MD, Thelma Comp [phone]) on  11/09/2013 1:42:25 PM      MDM   Final diagnoses:  Renal failure  Uremia    Pt with CKD, with nephrology wanting to start her on HD comes in with cc of worsening dib and intermittent chest pains. Chest pain is atypical and present since 1987. Dib is exertional - and getting worse as of late. She also has malaise, no fevers, or any SIRs criteria. We suspected that her sx are likely from worsening of renal failure and toxin build up - which is indeed supported by her chem 7.  Spoke with Nephro - and they will admit patient.    Varney Biles, MD 11/14/13 [phone]

## 2013-11-17 ENCOUNTER — Telehealth: Payer: Self-pay | Admitting: Internal Medicine

## 2013-11-17 NOTE — Telephone Encounter (Signed)
s.w. pt and advised on June/July appt...per Dr. Tyrell Antonio ok to move all appts to thur..pt is now on dialysis.

## 2013-11-18 ENCOUNTER — Ambulatory Visit: Payer: Medicare Other

## 2013-11-18 ENCOUNTER — Ambulatory Visit: Payer: Medicare Other | Admitting: Nurse Practitioner

## 2013-11-18 ENCOUNTER — Other Ambulatory Visit: Payer: Medicare Other

## 2013-11-19 ENCOUNTER — Other Ambulatory Visit (HOSPITAL_BASED_OUTPATIENT_CLINIC_OR_DEPARTMENT_OTHER): Payer: Medicare Other

## 2013-11-19 ENCOUNTER — Encounter: Payer: Self-pay | Admitting: Internal Medicine

## 2013-11-19 ENCOUNTER — Ambulatory Visit (HOSPITAL_BASED_OUTPATIENT_CLINIC_OR_DEPARTMENT_OTHER): Payer: Medicare Other | Admitting: Nurse Practitioner

## 2013-11-19 ENCOUNTER — Telehealth: Payer: Self-pay | Admitting: Internal Medicine

## 2013-11-19 ENCOUNTER — Ambulatory Visit: Payer: Medicare Other

## 2013-11-19 VITALS — BP 116/62 | HR 72 | Temp 98.2°F | Resp 20 | Ht 65.0 in | Wt 210.2 lb

## 2013-11-19 DIAGNOSIS — Z992 Dependence on renal dialysis: Secondary | ICD-10-CM

## 2013-11-19 DIAGNOSIS — N189 Chronic kidney disease, unspecified: Secondary | ICD-10-CM

## 2013-11-19 DIAGNOSIS — N039 Chronic nephritic syndrome with unspecified morphologic changes: Secondary | ICD-10-CM

## 2013-11-19 DIAGNOSIS — E8809 Other disorders of plasma-protein metabolism, not elsewhere classified: Secondary | ICD-10-CM

## 2013-11-19 DIAGNOSIS — C9000 Multiple myeloma not having achieved remission: Secondary | ICD-10-CM

## 2013-11-19 DIAGNOSIS — C9002 Multiple myeloma in relapse: Secondary | ICD-10-CM

## 2013-11-19 DIAGNOSIS — D631 Anemia in chronic kidney disease: Secondary | ICD-10-CM

## 2013-11-19 DIAGNOSIS — N19 Unspecified kidney failure: Secondary | ICD-10-CM

## 2013-11-19 LAB — CBC WITH DIFFERENTIAL/PLATELET
BASO%: 0.4 % (ref 0.0–2.0)
Basophils Absolute: 0 10*3/uL (ref 0.0–0.1)
EOS%: 0.8 % (ref 0.0–7.0)
Eosinophils Absolute: 0.1 10*3/uL (ref 0.0–0.5)
HCT: 28.4 % — ABNORMAL LOW (ref 34.8–46.6)
HGB: 9.5 g/dL — ABNORMAL LOW (ref 11.6–15.9)
LYMPH#: 1.2 10*3/uL (ref 0.9–3.3)
LYMPH%: 15.2 % (ref 14.0–49.7)
MCH: 30.7 pg (ref 25.1–34.0)
MCHC: 33.5 g/dL (ref 31.5–36.0)
MCV: 91.9 fL (ref 79.5–101.0)
MONO#: 0.7 10*3/uL (ref 0.1–0.9)
MONO%: 8.3 % (ref 0.0–14.0)
NEUT%: 75.3 % (ref 38.4–76.8)
NEUTROS ABS: 6 10*3/uL (ref 1.5–6.5)
Platelets: 189 10*3/uL (ref 145–400)
RBC: 3.09 10*6/uL — ABNORMAL LOW (ref 3.70–5.45)
RDW: 18.9 % — AB (ref 11.2–14.5)
WBC: 8 10*3/uL (ref 3.9–10.3)

## 2013-11-19 LAB — COMPREHENSIVE METABOLIC PANEL (CC13)
ALBUMIN: 3.4 g/dL — AB (ref 3.5–5.0)
ALT: 17 U/L (ref 0–55)
AST: 18 U/L (ref 5–34)
Alkaline Phosphatase: 62 U/L (ref 40–150)
Anion Gap: 11 mEq/L (ref 3–11)
BILIRUBIN TOTAL: 0.29 mg/dL (ref 0.20–1.20)
BUN: 22.7 mg/dL (ref 7.0–26.0)
CO2: 26 mEq/L (ref 22–29)
Calcium: 8.7 mg/dL (ref 8.4–10.4)
Chloride: 96 mEq/L — ABNORMAL LOW (ref 98–109)
Creatinine: 4 mg/dL (ref 0.6–1.1)
GLUCOSE: 170 mg/dL — AB (ref 70–140)
Potassium: 4.5 mEq/L (ref 3.5–5.1)
Sodium: 132 mEq/L — ABNORMAL LOW (ref 136–145)
Total Protein: 7.4 g/dL (ref 6.4–8.3)

## 2013-11-19 LAB — LACTATE DEHYDROGENASE (CC13): LDH: 282 U/L — AB (ref 125–245)

## 2013-11-19 NOTE — Progress Notes (Signed)
Per [employer] the patient is approved for Velcade asst 10/02/13-10/02/14 10,000.00. I will send to billing and medical records.

## 2013-11-19 NOTE — Telephone Encounter (Signed)
gv adn printed appt sched and avs for pt for June/July and Aug

## 2013-11-19 NOTE — Progress Notes (Addendum)
  [name] OFFICE PROGRESS NOTE   Diagnosis:  Multiple myeloma.  INTERVAL HISTORY:   [name] is a 80 year old woman with a plasma cell dyscrasia/multiple myeloma on active treatment with subcutaneous Velcade and Decadron. She was last treated on 10/28/2013. Velcade was held on 11/04/2013 due to a progressive rise in the creatinine.  She was hospitalized on 11/09/2013 with uremic syndrome and dialysis was initiated. She was discharged home on 11/11/2013.  She is feeling much better since dialysis was initiated. She denies nausea/vomiting. No mouth sores. No diarrhea or constipation. Appetite has improved. She denies pain at present. No shortness of breath.    Objective:  Vital signs in last 24 hours:  Blood pressure 116/62, pulse 72, temperature 98.2 F (36.8 C), temperature source Oral, resp. rate 20, height _0  (1.651 m), weight 210 lb 3.2 oz (95.346 kg), SpO2 100.00%.    HEENT: No thrush or ulcerations. Resp: Lungs clear. Cardio: Regular cardiac rhythm. No murmur. GI: Abdomen soft and nontender. No organomegaly. Vascular: No leg edema. Calves nontender. Neuro: Alert and oriented. Gait normal.      Lab Results:  Lab Results  Component Value Date   WBC 8.0 11/19/2013   HGB 9.5* 11/19/2013   HCT 28.4* 11/19/2013   MCV 91.9 11/19/2013   PLT 189 11/19/2013   NEUTROABS 6.0 11/19/2013   Creatinine 4.0, BUN 22.7, potassium 4.5 Imaging:  No results found.  Medications: I have reviewed the patient's current medications.  Assessment/Plan: 1. Plasma cell dyscrasia/multiple myeloma. Treatment initiated 07/15/2013 with weekly Velcade 1.3 mg per meter squared and Decadron 40 mg weekly. Serum light chains improved 09/09/2013. 2. Anemia secondary to chronic kidney disease. 3. ESRD now on dialysis.   Disposition: Ms. Appleton appears stable. Hemodialysis recently initiated. Restaging myeloma labs were obtained today with results currently pending.  She  would like to place treatment on hold pending availability of the myeloma lab results which we should have early next week. Once available [name] will make a decision regarding a treatment break versus continuation of therapy.  We scheduled a two-month office visit pending review of the labs.  She will contact the office prior to her next visit with any problems.  Patient seen with [name].    [name] ANP/GNP-BC   11/19/2013  3:16 PM  ADDENDUM: Hematology/Oncology Attending:  I had a face to face encounter with the patient. I recommended her care plan. This is a very pleasant 80 years old [place] American female with plasma cell dyscrasia/multiple myeloma currently undergoing treatment with weekly subcutaneous Velcade in addition to weekly Decadron. She is status post 16 cycles of treatment. She had repeat myeloma panel performed earlier today but the results are still unavailable. Depending on the lab results I may consider the patient for a break off chemotherapy versus continuing treatment with the same regimen.  She was started on hemodialysis recently secondary to deterioration of her renal function. She is currently on a Monday, Wednesday and Friday schedule. I will arrange for the patient to come back for followup visit in 2 months for reevaluation and repeat myeloma panel if there is no significant evidence for disease progression on the pending lab results. She was advised to call immediately if she has any concerning symptoms in the interval.  Disclaimer: This note was dictated with voice recognition software. Similar sounding words can inadvertently be transcribed and may not be corrected upon review. Eilleen Kempf., MD 11/21/2013

## 2013-11-23 ENCOUNTER — Telehealth: Payer: Self-pay | Admitting: *Deleted

## 2013-11-23 LAB — IGG, IGA, IGM
IGG (IMMUNOGLOBIN G), SERUM: 1090 mg/dL (ref 690–1700)
IgA: 116 mg/dL (ref 69–380)
IgM, Serum: 75 mg/dL (ref 52–322)

## 2013-11-23 LAB — BETA 2 MICROGLOBULIN, SERUM: BETA 2 MICROGLOBULIN: 17.4 mg/L — AB (ref <=2.51)

## 2013-11-23 LAB — KAPPA/LAMBDA LIGHT CHAINS
KAPPA FREE LGHT CHN: 7.05 mg/dL — AB (ref 0.33–1.94)
Kappa:Lambda Ratio: 0.55 (ref 0.26–1.65)
Lambda Free Lght Chn: 12.8 mg/dL — ABNORMAL HIGH (ref 0.57–2.63)

## 2013-11-23 NOTE — Telephone Encounter (Signed)
Pt called and left message.  Attempted to call pt back, no answer, left message for pt to call us back.  SLJ

## 2013-11-24 NOTE — Telephone Encounter (Signed)
Spoke with patient, she wanted to check about appts, per Dr Vista Mink pt is on chemo break and he will see her in August.  Appt given to patient, chemo cancelled in June and July.

## 2013-11-25 ENCOUNTER — Ambulatory Visit: Payer: Medicare Other

## 2013-11-25 ENCOUNTER — Other Ambulatory Visit: Payer: Medicare Other

## 2013-11-26 ENCOUNTER — Ambulatory Visit: Payer: Medicare Other

## 2013-11-26 ENCOUNTER — Other Ambulatory Visit: Payer: Medicare Other

## 2013-12-02 ENCOUNTER — Other Ambulatory Visit: Payer: Medicare Other

## 2013-12-02 ENCOUNTER — Ambulatory Visit: Payer: Medicare Other

## 2013-12-03 ENCOUNTER — Ambulatory Visit: Payer: Medicare Other

## 2013-12-03 ENCOUNTER — Other Ambulatory Visit: Payer: Medicare Other

## 2014-01-05 ENCOUNTER — Encounter: Payer: Self-pay | Admitting: Podiatry

## 2014-01-05 ENCOUNTER — Other Ambulatory Visit: Payer: Self-pay | Admitting: *Deleted

## 2014-01-05 ENCOUNTER — Ambulatory Visit (INDEPENDENT_AMBULATORY_CARE_PROVIDER_SITE_OTHER): Payer: Medicare Other | Admitting: Podiatry

## 2014-01-05 VITALS — BP 153/66 | HR 62 | Resp 16

## 2014-01-05 DIAGNOSIS — L600 Ingrowing nail: Secondary | ICD-10-CM

## 2014-01-05 NOTE — Progress Notes (Signed)
She presents today chief complaint of painful hallux nail left.  Objective: Pulses are strongly palpable. Painful thick yellow dystrophic with mycotic nail with mild paronychia surrounding the nail plate hallux left.  Assessment: Paronychia a subungual infection hallux left.  Plan: We performed a total nail avulsion today after 3 cc of 50-50 mixture of Marcaine plain lidocaine plain was infiltrated in a hallux block left was prepped and draped in is normal sterile fashion. The nail was debrided and removed. She was her soaking in Epsom salts warm water and I will followup with her in a week or so.

## 2014-01-14 ENCOUNTER — Other Ambulatory Visit (HOSPITAL_BASED_OUTPATIENT_CLINIC_OR_DEPARTMENT_OTHER): Payer: Medicare Other

## 2014-01-14 ENCOUNTER — Encounter: Payer: Self-pay | Admitting: Internal Medicine

## 2014-01-14 ENCOUNTER — Ambulatory Visit (HOSPITAL_BASED_OUTPATIENT_CLINIC_OR_DEPARTMENT_OTHER): Payer: Medicare Other | Admitting: Internal Medicine

## 2014-01-14 VITALS — BP 113/42 | HR 77 | Temp 98.6°F | Resp 19 | Ht 65.0 in | Wt 208.1 lb

## 2014-01-14 DIAGNOSIS — E8809 Other disorders of plasma-protein metabolism, not elsewhere classified: Secondary | ICD-10-CM

## 2014-01-14 DIAGNOSIS — Z992 Dependence on renal dialysis: Secondary | ICD-10-CM

## 2014-01-14 DIAGNOSIS — C9002 Multiple myeloma in relapse: Secondary | ICD-10-CM

## 2014-01-14 DIAGNOSIS — C9000 Multiple myeloma not having achieved remission: Secondary | ICD-10-CM

## 2014-01-14 LAB — COMPREHENSIVE METABOLIC PANEL (CC13)
ALBUMIN: 3.7 g/dL (ref 3.5–5.0)
ALT: 12 U/L (ref 0–55)
ANION GAP: 14 meq/L — AB (ref 3–11)
AST: 18 U/L (ref 5–34)
Alkaline Phosphatase: 76 U/L (ref 40–150)
BUN: 40.6 mg/dL — ABNORMAL HIGH (ref 7.0–26.0)
CALCIUM: 9.6 mg/dL (ref 8.4–10.4)
CHLORIDE: 95 meq/L — AB (ref 98–109)
CO2: 27 mEq/L (ref 22–29)
Creatinine: 3.9 mg/dL (ref 0.6–1.1)
Glucose: 128 mg/dl (ref 70–140)
POTASSIUM: 3.6 meq/L (ref 3.5–5.1)
Sodium: 136 mEq/L (ref 136–145)
Total Bilirubin: 0.37 mg/dL (ref 0.20–1.20)
Total Protein: 7.9 g/dL (ref 6.4–8.3)

## 2014-01-14 LAB — CBC WITH DIFFERENTIAL/PLATELET
BASO%: 1.1 % (ref 0.0–2.0)
Basophils Absolute: 0.1 10*3/uL (ref 0.0–0.1)
EOS%: 2.2 % (ref 0.0–7.0)
Eosinophils Absolute: 0.2 10*3/uL (ref 0.0–0.5)
HCT: 35.2 % (ref 34.8–46.6)
HGB: 11.5 g/dL — ABNORMAL LOW (ref 11.6–15.9)
LYMPH#: 1 10*3/uL (ref 0.9–3.3)
LYMPH%: 14.6 % (ref 14.0–49.7)
MCH: 30.8 pg (ref 25.1–34.0)
MCHC: 32.7 g/dL (ref 31.5–36.0)
MCV: 94.1 fL (ref 79.5–101.0)
MONO#: 0.7 10*3/uL (ref 0.1–0.9)
MONO%: 9.9 % (ref 0.0–14.0)
NEUT#: 5.1 10*3/uL (ref 1.5–6.5)
NEUT%: 72.2 % (ref 38.4–76.8)
Platelets: 207 10*3/uL (ref 145–400)
RBC: 3.74 10*6/uL (ref 3.70–5.45)
RDW: 17.7 % — AB (ref 11.2–14.5)
WBC: 7.1 10*3/uL (ref 3.9–10.3)

## 2014-01-14 LAB — LACTATE DEHYDROGENASE (CC13): LDH: 216 U/L (ref 125–245)

## 2014-01-14 NOTE — Progress Notes (Signed)
[place] Telephone:(336) [phone]   Fax:([phone]  OFFICE PROGRESS NOTE  [name], MD [place] [address]  DIAGNOSIS:  #1 plasma cell dyscrasia, multiple myeloma.  #2 anemia of chronic disease secondary to chronic kidney disease.   PRIOR THERAPY: Treatment with single agent subcutaneous Velcade 1.3 mg/m2 on a weekly basis with Decadron 40 mg by mouth weekly, status post 16 cycles. First cycle to be given 07/15/2013   CURRENT THERAPY: Observation.  INTERVAL HISTORY: Cardelia Sassano Rice 80 y.o. female returns to the clinic today for followup visit accompanied by her daughter.  The patient has been on hemodialysis for the last few months and she's feeling much better. She has been observation for the last 2 months after completing 16 weeks of treatment with Velcade and Decadron. She denied having any significant complaints today. She denied having any fever or chills, no nausea or vomiting. The patient denied having any significant chest pain, shortness of breath, cough or hemoptysis. She has no significant weight loss or night sweats. The patient had repeat myeloma panel performed earlier today and she is here for evaluation and discussion of her treatment options.  MEDICAL HISTORY: Past Medical History  Diagnosis Date  . Hypertension   . Asthma   . GERD (gastroesophageal reflux disease)   . Neuropathy in diabetes     Hx: of  . COPD (chronic obstructive pulmonary disease)   . Anemia   . High cholesterol   . Pulmonary embolism 1987  . Type II diabetes mellitus   . History of blood transfusion 1987; 2011    "related to lung OR; HgB went down to 5"  . Arthritis     "hips, knees, shoulders" (11/10/2013)  . Chronic lower back pain   . Gout   . ESRD (end stage renal disease) on dialysis     "1st treatment 11/09/2013"  . Kidney stones   . Multiple myeloma     ALLERGIES:  is allergic to penicillins cross  reactors.  MEDICATIONS:  Current Outpatient Prescriptions  Medication Sig Dispense Refill  . acyclovir (ZOVIRAX) 400 MG tablet Take 1 tablet (400 mg total) by mouth 2 (two) times daily.  60 tablet  2  . albuterol (PROVENTIL HFA;VENTOLIN HFA) 108 (90 BASE) MCG/ACT inhaler Inhale 1 puff into the lungs 2 (two) times daily as needed for wheezing or shortness of breath.      . allopurinol (ZYLOPRIM) 100 MG tablet Take 100 mg by mouth daily with breakfast.       . amLODipine (NORVASC) 5 MG tablet Take 5 mg by mouth daily with breakfast.       . dexamethasone (DECADRON) 4 MG tablet Take 40 mg by mouth once a week. On wednesday      . gabapentin (NEURONTIN) 100 MG capsule Take 100 mg by mouth 3 (three) times daily.      Marland Kitchen glimepiride (AMARYL) 1 MG tablet Take 1 mg by mouth daily with breakfast.       . HYDROcodone-acetaminophen (NORCO) 7.5-325 MG per tablet Take 1 tablet by mouth every 6 (six) hours as needed for moderate pain.      Marland Kitchen lidocaine-prilocaine (EMLA) cream       . Multiple Vitamins-Minerals (MULTIVITAMIN WITH MINERALS) tablet Take 2 tablets by mouth daily.      . multivitamin (RENA-VIT) TABS tablet Take 1 tablet by mouth daily.      . ONE TOUCH ULTRA TEST test strip       .  Oxycodone HCl 20 MG TABS Take 20 mg by mouth every 12 (twelve) hours.       . pantoprazole (PROTONIX) 40 MG tablet Take 40 mg by mouth 2 (two) times daily.       . saxagliptin HCl (ONGLYZA) 2.5 MG TABS tablet Take 2.5 mg by mouth daily with breakfast.       . ondansetron (ZOFRAN) 8 MG tablet Take 1 tablet (8 mg total) by mouth every 8 (eight) hours as needed for nausea or vomiting.  20 tablet  1   No current facility-administered medications for this visit.    SURGICAL HISTORY:  Past Surgical History  Procedure Laterality Date  . Lung removal, partial Left 1987  . Cholecystectomy    . Av fistula placement Left 2011    "lower arm; never used"  . Colonoscopy    . Abdominoplasty  ~ 1983    "tummy tuck"  .  Abdominal hysterectomy  1964  . Av fistula placement Left 05/15/2013    Procedure: INSERTION OF ARTERIOVENOUS (AV) GORE-TEX GRAFT ARM-LEFT UPPER ARM;  Surgeon: Mal Misty, MD;  Location: [facility];  Service: Vascular;  Laterality: Left;  . Appendectomy  1954  . Umbilical hernia repair  ~ 1983  . Hernia repair    . Tubal ligation  1964  . Cystoscopy w/ stone manipulation  1980's    REVIEW OF SYSTEMS:  Constitutional: positive for fatigue Eyes: negative Ears, nose, mouth, throat, and face: negative Respiratory: negative Cardiovascular: negative Gastrointestinal: negative Genitourinary:negative Integument/breast: negative Hematologic/lymphatic: negative Musculoskeletal:negative Neurological: negative Behavioral/Psych: negative Endocrine: negative Allergic/Immunologic: negative   PHYSICAL EXAMINATION: General appearance: alert, cooperative, fatigued and no distress Head: Normocephalic, without obvious abnormality, atraumatic Neck: no adenopathy Lymph nodes: Cervical, supraclavicular, and axillary nodes normal. Resp: clear to auscultation bilaterally Back: symmetric, no curvature. ROM normal. No CVA tenderness. Cardio: regular rate and rhythm, S1, S2 normal, no murmur, click, rub or gallop GI: soft, non-tender; bowel sounds normal; no masses,  no organomegaly Extremities: extremities normal, atraumatic, no cyanosis or edema Neurologic: Alert and oriented X 3, normal strength and tone. Normal symmetric reflexes. Normal coordination and gait  ECOG PERFORMANCE STATUS: 2 - Symptomatic, <50% confined to bed  Blood pressure 113/42, pulse 77, temperature 98.6 F (37 C), temperature source Oral, resp. rate 19, height $RemoveBe'5\' 5"'OoDzZeIch$  (1.651 m), weight 208 lb 1.6 oz (94.394 kg).  LABORATORY DATA: Lab Results  Component Value Date   WBC 7.1 01/14/2014   HGB 11.5* 01/14/2014   HCT 35.2 01/14/2014   MCV 94.1 01/14/2014   PLT 207 01/14/2014      Chemistry      Component Value Date/Time   NA 132*  11/19/2013 1406   NA 143 11/10/2013 0748   K 4.5 11/19/2013 1406   K 3.4* 11/10/2013 0748   CL 98 11/10/2013 0748   CL 98 09/17/2012 1242   CO2 26 11/19/2013 1406   CO2 30 11/10/2013 0748   BUN 22.7 11/19/2013 1406   BUN 80* 11/10/2013 0748   CREATININE 4.0* 11/19/2013 1406   CREATININE 3.66* 11/10/2013 0748      Component Value Date/Time   CALCIUM 8.7 11/19/2013 1406   CALCIUM 9.5 11/10/2013 0748   CALCIUM 8.6 12/02/2012 0933   ALKPHOS 62 11/19/2013 1406   ALKPHOS 81 03/26/2012 1643   AST 18 11/19/2013 1406   AST 23 03/26/2012 1643   ALT 17 11/19/2013 1406   ALT 14 03/26/2012 1643   BILITOT 0.29 11/19/2013 1406   BILITOT 0.2* 03/26/2012 1643  Other lab results: Myeloma panel is still pending.  ASSESSMENT: This is a very pleasant 80 years old [place] American female with plasma cell dyscrasia completed treatment with weekly subcutaneous Velcade and Decadron status post 16 weekly doses and tolerating it fairly well  Her myeloma panel is still pending. I recommended for the patient to continue on observation for now with repeat myeloma panel in 3 months unless the bloodwork performed earlier today showed significant evidence for disease progression, I would consider the patient for further discussion of treatment options. She would continue hemodialysis as scheduled by nephrology. She was advised to call immediately if she has any concerning symptoms in the interval.  Disclaimer: This note was dictated with voice recognition software. Similar sounding words can inadvertently be transcribed and may not be corrected upon review.

## 2014-01-15 ENCOUNTER — Other Ambulatory Visit: Payer: Self-pay | Admitting: Internal Medicine

## 2014-01-15 DIAGNOSIS — C9000 Multiple myeloma not having achieved remission: Secondary | ICD-10-CM

## 2014-01-15 NOTE — Progress Notes (Signed)
Quick Note:  Call patient with the result. Lab stable. Follow up in 2 months with lab. Schedular will call for Appt. ______

## 2014-01-18 ENCOUNTER — Telehealth: Payer: Self-pay | Admitting: Internal Medicine

## 2014-01-18 ENCOUNTER — Telehealth: Payer: Self-pay | Admitting: *Deleted

## 2014-01-18 LAB — KAPPA/LAMBDA LIGHT CHAINS
KAPPA LAMBDA RATIO: 0.37 (ref 0.26–1.65)
Kappa free light chain: 7.34 mg/dL — ABNORMAL HIGH (ref 0.33–1.94)
Lambda Free Lght Chn: 19.6 mg/dL — ABNORMAL HIGH (ref 0.57–2.63)

## 2014-01-18 LAB — IGG, IGA, IGM
IgA: 145 mg/dL (ref 69–380)
IgG (Immunoglobin G), Serum: 1390 mg/dL (ref 690–1700)
IgM, Serum: 104 mg/dL (ref 52–322)

## 2014-01-18 LAB — BETA 2 MICROGLOBULIN, SERUM: Beta-2 Microglobulin: 18.1 mg/L — ABNORMAL HIGH (ref <=2.51)

## 2014-01-18 NOTE — Telephone Encounter (Signed)
Message copied by Britt Bottom on Mon Jan 18, 2014 10:02 AM ------      Message from: Curt Bears      Created: Fri Jan 15, 2014 11:19 AM       Call patient with the result. Lab stable. Follow up in 2 months with lab. Schedular will call for Appt. ------

## 2014-01-18 NOTE — Telephone Encounter (Signed)
s/w pt gave appts for 10/13 + 10/20.

## 2014-01-18 NOTE — Telephone Encounter (Signed)
Pt verbalized understanding.  Onc tx schedule filled out.  SLJ

## 2014-03-16 ENCOUNTER — Other Ambulatory Visit (HOSPITAL_BASED_OUTPATIENT_CLINIC_OR_DEPARTMENT_OTHER): Payer: Medicare Other

## 2014-03-16 DIAGNOSIS — C9030 Solitary plasmacytoma not having achieved remission: Secondary | ICD-10-CM

## 2014-03-16 DIAGNOSIS — C9000 Multiple myeloma not having achieved remission: Secondary | ICD-10-CM

## 2014-03-16 LAB — CBC WITH DIFFERENTIAL/PLATELET
BASO%: 0.2 % (ref 0.0–2.0)
BASOS ABS: 0 10*3/uL (ref 0.0–0.1)
EOS ABS: 0.1 10*3/uL (ref 0.0–0.5)
EOS%: 2.1 % (ref 0.0–7.0)
HCT: 31.9 % — ABNORMAL LOW (ref 34.8–46.6)
HEMOGLOBIN: 10.6 g/dL — AB (ref 11.6–15.9)
LYMPH#: 1.1 10*3/uL (ref 0.9–3.3)
LYMPH%: 17.2 % (ref 14.0–49.7)
MCH: 29.4 pg (ref 25.1–34.0)
MCHC: 33.2 g/dL (ref 31.5–36.0)
MCV: 88.4 fL (ref 79.5–101.0)
MONO#: 0.5 10*3/uL (ref 0.1–0.9)
MONO%: 7.9 % (ref 0.0–14.0)
NEUT%: 72.6 % (ref 38.4–76.8)
NEUTROS ABS: 4.4 10*3/uL (ref 1.5–6.5)
Platelets: 193 10*3/uL (ref 145–400)
RBC: 3.61 10*6/uL — ABNORMAL LOW (ref 3.70–5.45)
RDW: 15.9 % — AB (ref 11.2–14.5)
WBC: 6.1 10*3/uL (ref 3.9–10.3)

## 2014-03-16 LAB — LACTATE DEHYDROGENASE (CC13): LDH: 214 U/L (ref 125–245)

## 2014-03-16 LAB — COMPREHENSIVE METABOLIC PANEL (CC13)
ALBUMIN: 3.5 g/dL (ref 3.5–5.0)
ALT: 17 U/L (ref 0–55)
AST: 14 U/L (ref 5–34)
Alkaline Phosphatase: 107 U/L (ref 40–150)
Anion Gap: 11 mEq/L (ref 3–11)
BUN: 44.4 mg/dL — ABNORMAL HIGH (ref 7.0–26.0)
CO2: 28 mEq/L (ref 22–29)
Calcium: 9.1 mg/dL (ref 8.4–10.4)
Chloride: 104 mEq/L (ref 98–109)
Creatinine: 3.7 mg/dL (ref 0.6–1.1)
GLUCOSE: 136 mg/dL (ref 70–140)
POTASSIUM: 4.3 meq/L (ref 3.5–5.1)
SODIUM: 143 meq/L (ref 136–145)
TOTAL PROTEIN: 7.8 g/dL (ref 6.4–8.3)
Total Bilirubin: 0.29 mg/dL (ref 0.20–1.20)

## 2014-03-18 LAB — KAPPA/LAMBDA LIGHT CHAINS
KAPPA LAMBDA RATIO: 0.39 (ref 0.26–1.65)
Kappa free light chain: 8.32 mg/dL — ABNORMAL HIGH (ref 0.33–1.94)
Lambda Free Lght Chn: 21.3 mg/dL — ABNORMAL HIGH (ref 0.57–2.63)

## 2014-03-18 LAB — IGG, IGA, IGM
IGA: 156 mg/dL (ref 69–380)
IGG (IMMUNOGLOBIN G), SERUM: 1360 mg/dL (ref 690–1700)
IgM, Serum: 110 mg/dL (ref 52–322)

## 2014-03-18 LAB — BETA 2 MICROGLOBULIN, SERUM: Beta-2 Microglobulin: 15.4 mg/L — ABNORMAL HIGH (ref <=2.51)

## 2014-03-23 ENCOUNTER — Encounter: Payer: Self-pay | Admitting: Internal Medicine

## 2014-03-23 ENCOUNTER — Telehealth: Payer: Self-pay | Admitting: *Deleted

## 2014-03-23 ENCOUNTER — Ambulatory Visit (HOSPITAL_BASED_OUTPATIENT_CLINIC_OR_DEPARTMENT_OTHER): Payer: Medicare Other | Admitting: Internal Medicine

## 2014-03-23 ENCOUNTER — Telehealth: Payer: Self-pay | Admitting: Internal Medicine

## 2014-03-23 VITALS — BP 137/52 | HR 73 | Temp 98.5°F | Resp 18 | Ht 65.0 in | Wt 207.3 lb

## 2014-03-23 DIAGNOSIS — C9000 Multiple myeloma not having achieved remission: Secondary | ICD-10-CM

## 2014-03-23 DIAGNOSIS — C9002 Multiple myeloma in relapse: Secondary | ICD-10-CM

## 2014-03-23 DIAGNOSIS — N186 End stage renal disease: Secondary | ICD-10-CM

## 2014-03-23 DIAGNOSIS — M1990 Unspecified osteoarthritis, unspecified site: Secondary | ICD-10-CM

## 2014-03-23 NOTE — Progress Notes (Signed)
[place] Telephone:([phone]   Fax:(336) [phone]  OFFICE PROGRESS NOTE  [name], MD [place] [address]  DIAGNOSIS:  #1 plasma cell dyscrasia, multiple myeloma.  #2 anemia of chronic disease secondary to chronic kidney disease.   PRIOR THERAPY: Treatment with single agent subcutaneous Velcade 1.3 mg/m2 on a weekly basis with Decadron 40 mg by mouth weekly, status post 16 cycles. First cycle to be given 07/15/2013   CURRENT THERAPY: Observation.  INTERVAL HISTORY: [name] 80 y.o. female returns to the clinic today for followup visit.  The patient is currently on hemodialysis on Monday, Wednesday and Friday. She has been observation for the last 4 months after completing 16 weeks of treatment with Velcade and Decadron. She continues to complain of fatigue and arthralgia especially after the dialysis but She denied having any significant complaints today. She denied having any fever or chills, no nausea or vomiting. The patient denied having any significant chest pain, shortness of breath, cough or hemoptysis. She has no significant weight loss or night sweats. The patient had repeat myeloma panel performed earlier today and she is here for evaluation and discussion of her treatment options.  MEDICAL HISTORY: Past Medical History  Diagnosis Date  . Hypertension   . Asthma   . GERD (gastroesophageal reflux disease)   . Neuropathy in diabetes     Hx: of  . COPD (chronic obstructive pulmonary disease)   . Anemia   . High cholesterol   . Pulmonary embolism 1987  . Type II diabetes mellitus   . History of blood transfusion 1987; 2011    "related to lung OR; HgB went down to 5"  . Arthritis     "hips, knees, shoulders" (11/10/2013)  . Chronic lower back pain   . Gout   . ESRD (end stage renal disease) on dialysis     "1st treatment 11/09/2013"  . Kidney stones   . Multiple myeloma     ALLERGIES:  is allergic  to penicillins cross reactors.  MEDICATIONS:  Current Outpatient Prescriptions  Medication Sig Dispense Refill  . albuterol (PROVENTIL HFA;VENTOLIN HFA) 108 (90 BASE) MCG/ACT inhaler Inhale 1 puff into the lungs 2 (two) times daily as needed for wheezing or shortness of breath.      . allopurinol (ZYLOPRIM) 100 MG tablet Take 100 mg by mouth daily with breakfast.       . amLODipine (NORVASC) 5 MG tablet Take 5 mg by mouth daily with breakfast.       . gabapentin (NEURONTIN) 100 MG capsule Take 100 mg by mouth 3 (three) times daily.      Marland Kitchen glimepiride (AMARYL) 1 MG tablet Take 1 mg by mouth daily with breakfast.       . HYDROcodone-acetaminophen (NORCO) 7.5-325 MG per tablet Take 1 tablet by mouth every 6 (six) hours as needed for moderate pain.      Marland Kitchen lidocaine-prilocaine (EMLA) cream       . Multiple Vitamins-Minerals (MULTIVITAMIN WITH MINERALS) tablet Take 2 tablets by mouth daily.      . multivitamin (RENA-VIT) TABS tablet Take 1 tablet by mouth daily.      . ondansetron (ZOFRAN) 8 MG tablet Take 1 tablet (8 mg total) by mouth every 8 (eight) hours as needed for nausea or vomiting.  20 tablet  1  . ONE TOUCH ULTRA TEST test strip       . Oxycodone HCl 20 MG TABS Take  20 mg by mouth every 12 (twelve) hours.       . pantoprazole (PROTONIX) 40 MG tablet Take 40 mg by mouth 2 (two) times daily.       . saxagliptin HCl (ONGLYZA) 2.5 MG TABS tablet Take 2.5 mg by mouth daily with breakfast.        No current facility-administered medications for this visit.    SURGICAL HISTORY:  Past Surgical History  Procedure Laterality Date  . Lung removal, partial Left 1987  . Cholecystectomy    . Av fistula placement Left 2011    "lower arm; never used"  . Colonoscopy    . Abdominoplasty  ~ 1983    "tummy tuck"  . Abdominal hysterectomy  1964  . Av fistula placement Left 05/15/2013    Procedure: INSERTION OF ARTERIOVENOUS (AV) GORE-TEX GRAFT ARM-LEFT UPPER ARM;  Surgeon: Pryor Ochoa, MD;   Location: [facility] OR;  Service: Vascular;  Laterality: Left;  . Appendectomy  1954  . Umbilical hernia repair  ~ 1983  . Hernia repair    . Tubal ligation  1964  . Cystoscopy w/ stone manipulation  1980's    REVIEW OF SYSTEMS:  Constitutional: positive for fatigue Eyes: negative Ears, nose, mouth, throat, and face: negative Respiratory: negative Cardiovascular: negative Gastrointestinal: negative Genitourinary:negative Integument/breast: negative Hematologic/lymphatic: negative Musculoskeletal:negative Neurological: negative Behavioral/Psych: negative Endocrine: negative Allergic/Immunologic: negative   PHYSICAL EXAMINATION: General appearance: alert, cooperative, fatigued and no distress Head: Normocephalic, without obvious abnormality, atraumatic Neck: no adenopathy Lymph nodes: Cervical, supraclavicular, and axillary nodes normal. Resp: clear to auscultation bilaterally Back: symmetric, no curvature. ROM normal. No CVA tenderness. Cardio: regular rate and rhythm, S1, S2 normal, no murmur, click, rub or gallop GI: soft, non-tender; bowel sounds normal; no masses,  no organomegaly Extremities: extremities normal, atraumatic, no cyanosis or edema Neurologic: Alert and oriented X 3, normal strength and tone. Normal symmetric reflexes. Normal coordination and gait  ECOG PERFORMANCE STATUS: 2 - Symptomatic, <50% confined to bed  Blood pressure 137/52, pulse 73, temperature 98.5 F (36.9 C), temperature source Oral, resp. rate 18, height 5\' 5"  (1.651 m), weight 207 lb 4.8 oz (94.031 kg), SpO2 100.00%.  LABORATORY DATA: Lab Results  Component Value Date   WBC 6.1 03/16/2014   HGB 10.6* 03/16/2014   HCT 31.9* 03/16/2014   MCV 88.4 03/16/2014   PLT 193 03/16/2014      Chemistry      Component Value Date/Time   NA 143 03/16/2014 0824   NA 143 11/10/2013 0748   K 4.3 03/16/2014 0824   K 3.4* 11/10/2013 0748   CL 98 11/10/2013 0748   CL 98 09/17/2012 1242   CO2 28 03/16/2014 0824     CO2 30 11/10/2013 0748   BUN 44.4* 03/16/2014 0824   BUN 80* 11/10/2013 0748   CREATININE 3.7* 03/16/2014 0824   CREATININE 3.66* 11/10/2013 0748      Component Value Date/Time   CALCIUM 9.1 03/16/2014 0824   CALCIUM 9.5 11/10/2013 0748   CALCIUM 8.6 12/02/2012 0933   ALKPHOS 107 03/16/2014 0824   ALKPHOS 81 03/26/2012 1643   AST 14 03/16/2014 0824   AST 23 03/26/2012 1643   ALT 17 03/16/2014 0824   ALT 14 03/26/2012 1643   BILITOT 0.29 03/16/2014 0824   BILITOT 0.2* 03/26/2012 1643     Other lab results: Showed evidence for disease progression especially with the free lambda light chain.  ASSESSMENT: This is a very pleasant 80 years old [place] American female with  plasma cell dyscrasia completed treatment with weekly subcutaneous Velcade and Decadron status post 16 weekly doses and tolerating it fairly well  The myeloma panel performed recently showed evidence for disease progression with further increase in the free lambda light chain. I discussed the lab result with the patient today. I recommended for her to resume her systemic treatment with subcutaneous Velcade 1.3 mg/M2 in addition to Decadron 40 mg by mouth on a weekly basis. She was reminded of the adverse effect of this treatment. The patient expected to start the first cycle of this treatment on 03/30/2014. She was also advised to resume her treatment with acyclovir prophylaxis. The patient would come back for followup visit in 3 weeks for reevaluation and management of any adverse effect of her treatment. She would continue hemodialysis as scheduled by nephrology. She was advised to call immediately if she has any concerning symptoms in the interval.  Disclaimer: This note was dictated with voice recognition software. Similar sounding words can inadvertently be transcribed and may not be corrected upon review.

## 2014-03-23 NOTE — Telephone Encounter (Signed)
Per staff message and POF I have scheduled appts. Advised scheduler of appts. JMW  

## 2014-03-23 NOTE — Telephone Encounter (Signed)
Pt confirmed labs/ov per 10/20 POF, sent msg to add chemo, gave pt AVS.... KJ °

## 2014-03-30 ENCOUNTER — Ambulatory Visit (HOSPITAL_BASED_OUTPATIENT_CLINIC_OR_DEPARTMENT_OTHER): Payer: Medicare Other

## 2014-03-30 ENCOUNTER — Encounter: Payer: Self-pay | Admitting: Pharmacist

## 2014-03-30 ENCOUNTER — Other Ambulatory Visit (HOSPITAL_BASED_OUTPATIENT_CLINIC_OR_DEPARTMENT_OTHER): Payer: Medicare Other

## 2014-03-30 VITALS — BP 144/55 | HR 72 | Temp 98.4°F | Resp 16

## 2014-03-30 DIAGNOSIS — C9000 Multiple myeloma not having achieved remission: Secondary | ICD-10-CM

## 2014-03-30 DIAGNOSIS — Z5112 Encounter for antineoplastic immunotherapy: Secondary | ICD-10-CM | POA: Diagnosis not present

## 2014-03-30 DIAGNOSIS — C9002 Multiple myeloma in relapse: Secondary | ICD-10-CM

## 2014-03-30 LAB — COMPREHENSIVE METABOLIC PANEL (CC13)
ALK PHOS: 109 U/L (ref 40–150)
ALT: 16 U/L (ref 0–55)
ANION GAP: 12 meq/L — AB (ref 3–11)
AST: 14 U/L (ref 5–34)
Albumin: 3.7 g/dL (ref 3.5–5.0)
BUN: 43 mg/dL — ABNORMAL HIGH (ref 7.0–26.0)
CO2: 27 meq/L (ref 22–29)
Calcium: 9.5 mg/dL (ref 8.4–10.4)
Chloride: 101 mEq/L (ref 98–109)
Creatinine: 3.6 mg/dL (ref 0.6–1.1)
GLUCOSE: 153 mg/dL — AB (ref 70–140)
Potassium: 4.2 mEq/L (ref 3.5–5.1)
SODIUM: 141 meq/L (ref 136–145)
TOTAL PROTEIN: 8 g/dL (ref 6.4–8.3)
Total Bilirubin: 0.34 mg/dL (ref 0.20–1.20)

## 2014-03-30 LAB — CBC WITH DIFFERENTIAL/PLATELET
BASO%: 1 % (ref 0.0–2.0)
Basophils Absolute: 0.1 10*3/uL (ref 0.0–0.1)
EOS ABS: 0.2 10*3/uL (ref 0.0–0.5)
EOS%: 1.8 % (ref 0.0–7.0)
HCT: 32.7 % — ABNORMAL LOW (ref 34.8–46.6)
HGB: 10.4 g/dL — ABNORMAL LOW (ref 11.6–15.9)
LYMPH%: 14.1 % (ref 14.0–49.7)
MCH: 28.6 pg (ref 25.1–34.0)
MCHC: 32 g/dL (ref 31.5–36.0)
MCV: 89.4 fL (ref 79.5–101.0)
MONO#: 0.7 10*3/uL (ref 0.1–0.9)
MONO%: 7.9 % (ref 0.0–14.0)
NEUT%: 75.2 % (ref 38.4–76.8)
NEUTROS ABS: 6.3 10*3/uL (ref 1.5–6.5)
Platelets: 278 10*3/uL (ref 145–400)
RBC: 3.65 10*6/uL — ABNORMAL LOW (ref 3.70–5.45)
RDW: 17.3 % — AB (ref 11.2–14.5)
WBC: 8.4 10*3/uL (ref 3.9–10.3)
lymph#: 1.2 10*3/uL (ref 0.9–3.3)

## 2014-03-30 MED ORDER — BORTEZOMIB CHEMO SQ INJECTION 3.5 MG (2.5MG/ML)
1.3000 mg/m2 | Freq: Once | INTRAMUSCULAR | Status: AC
  Administered 2014-03-30: 2.75 mg via SUBCUTANEOUS
  Filled 2014-03-30: qty 2.75

## 2014-03-30 MED ORDER — ONDANSETRON HCL 8 MG PO TABS
ORAL_TABLET | ORAL | Status: AC
  Filled 2014-03-30: qty 1

## 2014-03-30 MED ORDER — ONDANSETRON HCL 8 MG PO TABS
8.0000 mg | ORAL_TABLET | Freq: Once | ORAL | Status: AC
  Administered 2014-03-30: 8 mg via ORAL

## 2014-03-30 NOTE — Progress Notes (Signed)
OK to treat today with velcade with creatinine of 3.6.  VO given and received from Dr. Lona Kettle. Patient refused flu vaccine.  States she "does not take the flu vaccine."

## 2014-03-30 NOTE — Patient Instructions (Signed)
[facility] Discharge Instructions for Patients Receiving Chemotherapy  Today you received the following chemotherapy agents: Velcade  To help prevent nausea and vomiting after your treatment, we encourage you to take your nausea medication as prescribed. - zofran   If you develop nausea and vomiting that is not controlled by your nausea medication, call the clinic.   BELOW ARE SYMPTOMS THAT SHOULD BE REPORTED IMMEDIATELY:  *FEVER GREATER THAN 100.5 F  *CHILLS WITH OR WITHOUT FEVER  NAUSEA AND VOMITING THAT IS NOT CONTROLLED WITH YOUR NAUSEA MEDICATION  *UNUSUAL SHORTNESS OF BREATH  *UNUSUAL BRUISING OR BLEEDING  TENDERNESS IN MOUTH AND THROAT WITH OR WITHOUT PRESENCE OF ULCERS  *URINARY PROBLEMS  *BOWEL PROBLEMS  UNUSUAL RASH Items with * indicate a potential emergency and should be followed up as soon as possible.  Feel free to call the clinic you have any questions or concerns. The clinic phone number is [phone].

## 2014-03-30 NOTE — Progress Notes (Signed)
Patient restarting on weekly velcade SQ injections and dexamethasone 40mg  PO weekly. Per Dr. Julien Nordmann note on 03/23/14, patient is to restart acyclovir prophylaxis and weekly dexamethasone. S/w patient, she still has prescriptions for both. Due to reduced CrCl. Patient was instructed to take acyclovir 400mg  PO daily instead of BID.  Currie Paris, PharmD, BCPS Pager: [phone] Pharmacy: [phone] 03/30/2014 10:17 AM

## 2014-04-06 ENCOUNTER — Other Ambulatory Visit (HOSPITAL_BASED_OUTPATIENT_CLINIC_OR_DEPARTMENT_OTHER): Payer: Medicare Other

## 2014-04-06 ENCOUNTER — Ambulatory Visit (HOSPITAL_BASED_OUTPATIENT_CLINIC_OR_DEPARTMENT_OTHER): Payer: Medicare Other

## 2014-04-06 DIAGNOSIS — C9002 Multiple myeloma in relapse: Secondary | ICD-10-CM

## 2014-04-06 DIAGNOSIS — C9000 Multiple myeloma not having achieved remission: Secondary | ICD-10-CM

## 2014-04-06 DIAGNOSIS — Z5112 Encounter for antineoplastic immunotherapy: Secondary | ICD-10-CM

## 2014-04-06 LAB — COMPREHENSIVE METABOLIC PANEL (CC13)
ALT: 17 U/L (ref 0–55)
ANION GAP: 14 meq/L — AB (ref 3–11)
AST: 13 U/L (ref 5–34)
Albumin: 3.6 g/dL (ref 3.5–5.0)
Alkaline Phosphatase: 98 U/L (ref 40–150)
BUN: 44.7 mg/dL — AB (ref 7.0–26.0)
CALCIUM: 8.9 mg/dL (ref 8.4–10.4)
CHLORIDE: 99 meq/L (ref 98–109)
CO2: 28 meq/L (ref 22–29)
Creatinine: 4 mg/dL (ref 0.6–1.1)
Glucose: 140 mg/dl (ref 70–140)
Potassium: 3.8 mEq/L (ref 3.5–5.1)
Sodium: 141 mEq/L (ref 136–145)
Total Bilirubin: 0.47 mg/dL (ref 0.20–1.20)
Total Protein: 7.7 g/dL (ref 6.4–8.3)

## 2014-04-06 LAB — CBC WITH DIFFERENTIAL/PLATELET
BASO%: 0.5 % (ref 0.0–2.0)
Basophils Absolute: 0 10*3/uL (ref 0.0–0.1)
EOS%: 1.7 % (ref 0.0–7.0)
Eosinophils Absolute: 0.1 10*3/uL (ref 0.0–0.5)
HCT: 31.6 % — ABNORMAL LOW (ref 34.8–46.6)
HGB: 10.3 g/dL — ABNORMAL LOW (ref 11.6–15.9)
LYMPH%: 14.9 % (ref 14.0–49.7)
MCH: 29.1 pg (ref 25.1–34.0)
MCHC: 32.7 g/dL (ref 31.5–36.0)
MCV: 89.1 fL (ref 79.5–101.0)
MONO#: 0.6 10*3/uL (ref 0.1–0.9)
MONO%: 9.5 % (ref 0.0–14.0)
NEUT#: 5 10*3/uL (ref 1.5–6.5)
NEUT%: 73.4 % (ref 38.4–76.8)
PLATELETS: 249 10*3/uL (ref 145–400)
RBC: 3.55 10*6/uL — AB (ref 3.70–5.45)
RDW: 18.6 % — ABNORMAL HIGH (ref 11.2–14.5)
WBC: 6.8 10*3/uL (ref 3.9–10.3)
lymph#: 1 10*3/uL (ref 0.9–3.3)

## 2014-04-06 MED ORDER — ONDANSETRON HCL 8 MG PO TABS
8.0000 mg | ORAL_TABLET | Freq: Once | ORAL | Status: AC
  Administered 2014-04-06: 8 mg via ORAL

## 2014-04-06 MED ORDER — BORTEZOMIB CHEMO SQ INJECTION 3.5 MG (2.5MG/ML)
1.3000 mg/m2 | Freq: Once | INTRAMUSCULAR | Status: AC
  Administered 2014-04-06: 2.75 mg via SUBCUTANEOUS
  Filled 2014-04-06: qty 2.75

## 2014-04-06 MED ORDER — ONDANSETRON HCL 8 MG PO TABS
ORAL_TABLET | ORAL | Status: AC
  Filled 2014-04-06: qty 1

## 2014-04-06 NOTE — Patient Instructions (Signed)
[facility] Discharge Instructions for Patients Receiving Chemotherapy  Today you received the following chemotherapy agents: Velcade. To help prevent nausea and vomiting after your treatment, we encourage you to take your nausea medication.  If you develop nausea and vomiting that is not controlled by your nausea medication, call the clinic.   BELOW ARE SYMPTOMS THAT SHOULD BE REPORTED IMMEDIATELY:  *FEVER GREATER THAN 100.5 F  *CHILLS WITH OR WITHOUT FEVER  NAUSEA AND VOMITING THAT IS NOT CONTROLLED WITH YOUR NAUSEA MEDICATION  *UNUSUAL SHORTNESS OF BREATH  *UNUSUAL BRUISING OR BLEEDING  TENDERNESS IN MOUTH AND THROAT WITH OR WITHOUT PRESENCE OF ULCERS  *URINARY PROBLEMS  *BOWEL PROBLEMS  UNUSUAL RASH Items with * indicate a potential emergency and should be followed up as soon as possible.  Feel free to call the clinic you have any questions or concerns. The clinic phone number is [phone].    

## 2014-04-13 ENCOUNTER — Telehealth: Payer: Self-pay | Admitting: *Deleted

## 2014-04-13 ENCOUNTER — Encounter: Payer: Self-pay | Admitting: Physician Assistant

## 2014-04-13 ENCOUNTER — Ambulatory Visit (HOSPITAL_BASED_OUTPATIENT_CLINIC_OR_DEPARTMENT_OTHER): Payer: Medicare Other | Admitting: Physician Assistant

## 2014-04-13 ENCOUNTER — Other Ambulatory Visit (HOSPITAL_BASED_OUTPATIENT_CLINIC_OR_DEPARTMENT_OTHER): Payer: Medicare Other

## 2014-04-13 ENCOUNTER — Telehealth: Payer: Self-pay | Admitting: Physician Assistant

## 2014-04-13 ENCOUNTER — Ambulatory Visit (HOSPITAL_BASED_OUTPATIENT_CLINIC_OR_DEPARTMENT_OTHER): Payer: Medicare Other

## 2014-04-13 VITALS — BP 138/51 | HR 75 | Temp 98.9°F | Resp 18 | Ht 65.0 in | Wt 206.8 lb

## 2014-04-13 DIAGNOSIS — N186 End stage renal disease: Secondary | ICD-10-CM

## 2014-04-13 DIAGNOSIS — Z5112 Encounter for antineoplastic immunotherapy: Secondary | ICD-10-CM

## 2014-04-13 DIAGNOSIS — D631 Anemia in chronic kidney disease: Secondary | ICD-10-CM

## 2014-04-13 DIAGNOSIS — Z992 Dependence on renal dialysis: Secondary | ICD-10-CM

## 2014-04-13 DIAGNOSIS — C9002 Multiple myeloma in relapse: Secondary | ICD-10-CM

## 2014-04-13 DIAGNOSIS — C9000 Multiple myeloma not having achieved remission: Secondary | ICD-10-CM

## 2014-04-13 LAB — CBC WITH DIFFERENTIAL/PLATELET
BASO%: 0.6 % (ref 0.0–2.0)
BASOS ABS: 0.1 10*3/uL (ref 0.0–0.1)
EOS ABS: 0.2 10*3/uL (ref 0.0–0.5)
EOS%: 2.6 % (ref 0.0–7.0)
HEMATOCRIT: 31.1 % — AB (ref 34.8–46.6)
HGB: 10.3 g/dL — ABNORMAL LOW (ref 11.6–15.9)
LYMPH#: 1.2 10*3/uL (ref 0.9–3.3)
LYMPH%: 15.1 % (ref 14.0–49.7)
MCH: 29.7 pg (ref 25.1–34.0)
MCHC: 33 g/dL (ref 31.5–36.0)
MCV: 89.9 fL (ref 79.5–101.0)
MONO#: 0.8 10*3/uL (ref 0.1–0.9)
MONO%: 9.6 % (ref 0.0–14.0)
NEUT%: 72.1 % (ref 38.4–76.8)
NEUTROS ABS: 5.7 10*3/uL (ref 1.5–6.5)
Platelets: 231 10*3/uL (ref 145–400)
RBC: 3.46 10*6/uL — ABNORMAL LOW (ref 3.70–5.45)
RDW: 18.6 % — AB (ref 11.2–14.5)
WBC: 7.9 10*3/uL (ref 3.9–10.3)

## 2014-04-13 LAB — COMPREHENSIVE METABOLIC PANEL (CC13)
ALT: 14 U/L (ref 0–55)
AST: 15 U/L (ref 5–34)
Albumin: 3.5 g/dL (ref 3.5–5.0)
Alkaline Phosphatase: 95 U/L (ref 40–150)
Anion Gap: 5 mEq/L (ref 3–11)
BUN: 49.8 mg/dL — AB (ref 7.0–26.0)
CALCIUM: 8.8 mg/dL (ref 8.4–10.4)
CHLORIDE: 105 meq/L (ref 98–109)
CO2: 28 meq/L (ref 22–29)
Creatinine: 3.8 mg/dL (ref 0.6–1.1)
Glucose: 179 mg/dl — ABNORMAL HIGH (ref 70–140)
Potassium: 3.9 mEq/L (ref 3.5–5.1)
Sodium: 138 mEq/L (ref 136–145)
Total Bilirubin: 0.46 mg/dL (ref 0.20–1.20)
Total Protein: 7.3 g/dL (ref 6.4–8.3)

## 2014-04-13 MED ORDER — ONDANSETRON HCL 8 MG PO TABS
8.0000 mg | ORAL_TABLET | Freq: Once | ORAL | Status: AC
  Administered 2014-04-13: 8 mg via ORAL

## 2014-04-13 MED ORDER — ONDANSETRON HCL 8 MG PO TABS
ORAL_TABLET | ORAL | Status: AC
  Filled 2014-04-13: qty 1

## 2014-04-13 MED ORDER — BORTEZOMIB CHEMO SQ INJECTION 3.5 MG (2.5MG/ML)
1.3000 mg/m2 | Freq: Once | INTRAMUSCULAR | Status: AC
Start: 2014-04-13 — End: 2014-04-13
  Administered 2014-04-13: 2.75 mg via SUBCUTANEOUS
  Filled 2014-04-13: qty 2.75

## 2014-04-13 NOTE — Telephone Encounter (Signed)
Patient's daughter called to move appts to Saturdays. I have called the patient backa and told her we don't give treamtents on the weekemds

## 2014-04-13 NOTE — Progress Notes (Addendum)
[place] [phone]   [phone]  OFFICE PROGRESS NOTE  Julia Brazil, MD [place] [address]  DIAGNOSIS:  #1 plasma cell dyscrasia, multiple myeloma.  #2 anemia of chronic disease secondary to chronic kidney disease.   PRIOR THERAPY: Treatment with single agent subcutaneous Velcade 1.3 mg/m2 on a weekly basis with Decadron 40 mg by mouth weekly, status post 16 cycles. First cycle to be given 07/15/2013   CURRENT THERAPY: Systemic treatment with subcutaneous Velcade 1.3 mg/M2 in addition to Decadron 40 mg by mouth on a weekly basis.  First cycle of this treatment given on 03/30/2014.  INTERVAL HISTORY: [name] 80 y.o. female returns to the clinic today for followup visit.  The patient is currently on hemodialysis on Monday, Wednesday and Friday. She was recently restarted on systemic chemotherapy withweekly subcutaneous Velcade in addition to 40 mg of Decadron by mouth once weekly. She is tolerating her treatment without difficulty. She continues on dialysis on Monday Wednesdays and Fridays and reports that she has been on dialysis since February. She had her dialysis shunt placed in the left arm and reports having some difficulty with "clogging" towards the end of May/the first part of June. This is been resolved and she's had no further issues. She continues to complain of fatigue and arthralgia especially after the dialysis but She denied having any significant complaints today. She denied having any fever or chills, no nausea or vomiting. The patient denied having any significant chest pain, shortness of breath, cough or hemoptysis. She has no significant weight loss or night sweats.   MEDICAL HISTORY: Past Medical History  Diagnosis Date  . Hypertension   . Asthma   . GERD (gastroesophageal reflux disease)   . Neuropathy in diabetes     Hx: of  . COPD (chronic obstructive pulmonary disease)   .  Anemia   . High cholesterol   . Pulmonary embolism 1987  . Type II diabetes mellitus   . History of blood transfusion 1987; 2011    "related to lung OR; HgB went down to 5"  . Arthritis     "hips, knees, shoulders" (11/10/2013)  . Chronic lower back pain   . Gout   . ESRD (end stage renal disease) on dialysis     "1st treatment 11/09/2013"  . Kidney stones   . Multiple myeloma     ALLERGIES:  is allergic to penicillins cross reactors.  MEDICATIONS:  Current Outpatient Prescriptions  Medication Sig Dispense Refill  . acyclovir (ZOVIRAX) 400 MG tablet Take 400 mg by mouth daily.    Marland Kitchen albuterol (PROVENTIL HFA;VENTOLIN HFA) 108 (90 BASE) MCG/ACT inhaler Inhale 1 puff into the lungs 2 (two) times daily as needed for wheezing or shortness of breath.    . allopurinol (ZYLOPRIM) 100 MG tablet Take 100 mg by mouth daily with breakfast.     . amLODipine (NORVASC) 5 MG tablet Take 5 mg by mouth daily with breakfast.     . gabapentin (NEURONTIN) 100 MG capsule Take 100 mg by mouth 3 (three) times daily.    Marland Kitchen glimepiride (AMARYL) 1 MG tablet Take 1 mg by mouth daily with breakfast.     . HYDROcodone-acetaminophen (NORCO) 7.5-325 MG per tablet Take 1 tablet by mouth every 6 (six) hours as needed for moderate pain.    Marland Kitchen lidocaine-prilocaine (EMLA) cream     . Multiple Vitamins-Minerals (MULTIVITAMIN WITH MINERALS) tablet Take 2 tablets by mouth  daily.    . multivitamin (RENA-VIT) TABS tablet Take 1 tablet by mouth daily.    . ondansetron (ZOFRAN) 8 MG tablet Take 1 tablet (8 mg total) by mouth every 8 (eight) hours as needed for nausea or vomiting. 20 tablet 1  . ONE TOUCH ULTRA TEST test strip     . Oxycodone HCl 20 MG TABS Take 20 mg by mouth every 12 (twelve) hours.     . pantoprazole (PROTONIX) 40 MG tablet Take 40 mg by mouth 2 (two) times daily.     . saxagliptin HCl (ONGLYZA) 2.5 MG TABS tablet Take 2.5 mg by mouth daily with breakfast.     . cyclobenzaprine (FLEXERIL) 5 MG tablet   0   No  current facility-administered medications for this visit.    SURGICAL HISTORY:  Past Surgical History  Procedure Laterality Date  . Lung removal, partial Left 1987  . Cholecystectomy    . Av fistula placement Left 2011    "lower arm; never used"  . Colonoscopy    . Abdominoplasty  ~ 1983    "tummy tuck"  . Abdominal hysterectomy  1964  . Av fistula placement Left 05/15/2013    Procedure: INSERTION OF ARTERIOVENOUS (AV) GORE-TEX GRAFT ARM-LEFT UPPER ARM;  Surgeon: Mal Misty, MD;  Location: [facility];  Service: Vascular;  Laterality: Left;  . Appendectomy  1954  . Umbilical hernia repair  ~ 1983  . Hernia repair    . Tubal ligation  1964  . Cystoscopy w/ stone manipulation  1980's    REVIEW OF SYSTEMS:  Constitutional: positive for fatigue Eyes: negative Ears, nose, mouth, throat, and face: negative Respiratory: negative Cardiovascular: negative Gastrointestinal: negative Genitourinary:negative Integument/breast: negative Hematologic/lymphatic: negative Musculoskeletal:negative Neurological: negative Behavioral/Psych: negative Endocrine: negative Allergic/Immunologic: negative   PHYSICAL EXAMINATION: General appearance: alert, cooperative, fatigued and no distress Head: Normocephalic, without obvious abnormality, atraumatic Neck: no adenopathy Lymph nodes: Cervical, supraclavicular, and axillary nodes normal. Resp: clear to auscultation bilaterally Back: symmetric, no curvature. ROM normal. No CVA tenderness. Cardio: regular rate and rhythm, S1, S2 normal, no murmur, click, rub or gallop GI: soft, non-tender; bowel sounds normal; no masses,  no organomegaly Extremities: extremities normal, atraumatic, no cyanosis or edema Neurologic: Alert and oriented X 3, normal strength and tone. Normal symmetric reflexes. Normal coordination and gait  ECOG PERFORMANCE STATUS: 2 - Symptomatic, <50% confined to bed  Blood pressure 138/51, pulse 75, temperature 98.9 F (37.2 C),  temperature source Oral, resp. rate 18, height $RemoveBe'5\' 5"'pixAucPuP$  (1.651 m), weight 206 lb 12.8 oz (93.804 kg).  LABORATORY DATA: Lab Results  Component Value Date   WBC 7.9 04/13/2014   HGB 10.3* 04/13/2014   HCT 31.1* 04/13/2014   MCV 89.9 04/13/2014   PLT 231 04/13/2014      Chemistry      Component Value Date/Time   NA 138 04/13/2014 0828   NA 143 11/10/2013 0748   K 3.9 04/13/2014 0828   K 3.4* 11/10/2013 0748   CL 98 11/10/2013 0748   CL 98 09/17/2012 1242   CO2 28 04/13/2014 0828   CO2 30 11/10/2013 0748   BUN 49.8* 04/13/2014 0828   BUN 80* 11/10/2013 0748   CREATININE 3.8* 04/13/2014 0828   CREATININE 3.66* 11/10/2013 0748      Component Value Date/Time   CALCIUM 8.8 04/13/2014 0828   CALCIUM 9.5 11/10/2013 0748   CALCIUM 8.6 12/02/2012 0933   ALKPHOS 95 04/13/2014 0828   ALKPHOS 81 03/26/2012 1643   AST 15  04/13/2014 0828   AST 23 03/26/2012 1643   ALT 14 04/13/2014 0828   ALT 14 03/26/2012 1643   BILITOT 0.46 04/13/2014 0828   BILITOT 0.2* 03/26/2012 1643     Other lab results: Showed evidence for disease progression especially with the free lambda light chain.  ASSESSMENT: This is a very pleasant 80 years old [place] American female with plasma cell dyscrasia completed treatment with weekly subcutaneous Velcade and Decadron status post 16 weekly doses and tolerating it fairly well  The myeloma panel performed recently showed evidence for disease progression with further increase in the free lambda light chain. She has resumed her systemic treatment with subcutaneous Velcade 1.3 mg/M2 in addition to Decadron 40 mg by mouth on a weekly basis.patient was discussed with and also seen by Dr. Julien Nordmann. She is tolerating her treatment without difficulty. She will continue on her current treatment with weekly subcutaneous Velcade and weekly Decadron by mouth. She'll follow-up in3 weeks for another symptom management visit. She will continue her treatment with acyclovir  prophylaxis.  She would continue hemodialysis as scheduled by nephrology. She was advised to call immediately if she has any concerning symptoms in the interval.  Disclaimer: This note was dictated with voice recognition software. Similar sounding words can inadvertently be transcribed and may not be corrected upon review.  Carlton Adam PA-C  ADDENDUM: Hematology/Oncology Attending: I had a face to face encounter with the patient. I recommended her care plan. This is a very pleasant 80 years old African-American female with history of multiple myeloma as well as and a stage renal disease and currently on dialysis. The patient is currently on treatment with subcutaneous Velcade and Decadron and tolerating her treatment fairly well. I recommended for her to continue her current treatment as scheduled. She would come back for follow-up visit in 3 weeks for reevaluation and repeat blood work. She was advised to call immediately if she has any concerning symptoms in the interval.  Disclaimer: This note was dictated with voice recognition software. Similar sounding words can inadvertently be transcribed and may be missed upon review. Eilleen Kempf., MD 04/17/2014

## 2014-04-13 NOTE — Patient Instructions (Signed)
[facility] Discharge Instructions for Patients Receiving Chemotherapy  Today you received the following chemotherapy agents: Velcade  To help prevent nausea and vomiting after your treatment, we encourage you to take your nausea medication as prescribed by your physician.   If you develop nausea and vomiting that is not controlled by your nausea medication, call the clinic.   BELOW ARE SYMPTOMS THAT SHOULD BE REPORTED IMMEDIATELY:  *FEVER GREATER THAN 100.5 F  *CHILLS WITH OR WITHOUT FEVER  NAUSEA AND VOMITING THAT IS NOT CONTROLLED WITH YOUR NAUSEA MEDICATION  *UNUSUAL SHORTNESS OF BREATH  *UNUSUAL BRUISING OR BLEEDING  TENDERNESS IN MOUTH AND THROAT WITH OR WITHOUT PRESENCE OF ULCERS  *URINARY PROBLEMS  *BOWEL PROBLEMS  UNUSUAL RASH Items with * indicate a potential emergency and should be followed up as soon as possible.  Feel free to call the clinic you have any questions or concerns. The clinic phone number is [phone].

## 2014-04-13 NOTE — Telephone Encounter (Signed)
Per staff message and POF I have scheduled appts. Advised scheduler of appts. JMW  

## 2014-04-13 NOTE — Telephone Encounter (Signed)
Open by misake 

## 2014-04-13 NOTE — Progress Notes (Signed)
Per Adrena PA, okay to tx with creatinine 3.8-- patient on dialysis.

## 2014-04-15 NOTE — Patient Instructions (Signed)
Continue labs and chemotherapy as scheduled Follow up in 3 weeks 

## 2014-04-20 ENCOUNTER — Other Ambulatory Visit (HOSPITAL_BASED_OUTPATIENT_CLINIC_OR_DEPARTMENT_OTHER): Payer: Medicare Other

## 2014-04-20 ENCOUNTER — Ambulatory Visit (HOSPITAL_BASED_OUTPATIENT_CLINIC_OR_DEPARTMENT_OTHER): Payer: Medicare Other

## 2014-04-20 ENCOUNTER — Other Ambulatory Visit: Payer: Self-pay | Admitting: Internal Medicine

## 2014-04-20 DIAGNOSIS — C9000 Multiple myeloma not having achieved remission: Secondary | ICD-10-CM

## 2014-04-20 DIAGNOSIS — Z5112 Encounter for antineoplastic immunotherapy: Secondary | ICD-10-CM

## 2014-04-20 LAB — CBC WITH DIFFERENTIAL/PLATELET
BASO%: 1 % (ref 0.0–2.0)
BASOS ABS: 0.1 10*3/uL (ref 0.0–0.1)
EOS%: 1.6 % (ref 0.0–7.0)
Eosinophils Absolute: 0.1 10*3/uL (ref 0.0–0.5)
HCT: 31.3 % — ABNORMAL LOW (ref 34.8–46.6)
HEMOGLOBIN: 10.2 g/dL — AB (ref 11.6–15.9)
LYMPH%: 20.3 % (ref 14.0–49.7)
MCH: 29.8 pg (ref 25.1–34.0)
MCHC: 32.6 g/dL (ref 31.5–36.0)
MCV: 91.3 fL (ref 79.5–101.0)
MONO#: 0.6 10*3/uL (ref 0.1–0.9)
MONO%: 7.8 % (ref 0.0–14.0)
NEUT#: 5.3 10*3/uL (ref 1.5–6.5)
NEUT%: 69.3 % (ref 38.4–76.8)
PLATELETS: 227 10*3/uL (ref 145–400)
RBC: 3.43 10*6/uL — ABNORMAL LOW (ref 3.70–5.45)
RDW: 20.2 % — ABNORMAL HIGH (ref 11.2–14.5)
WBC: 7.7 10*3/uL (ref 3.9–10.3)
lymph#: 1.6 10*3/uL (ref 0.9–3.3)

## 2014-04-20 LAB — COMPREHENSIVE METABOLIC PANEL (CC13)
ALBUMIN: 3.5 g/dL (ref 3.5–5.0)
ALK PHOS: 91 U/L (ref 40–150)
ALT: 16 U/L (ref 0–55)
AST: 15 U/L (ref 5–34)
Anion Gap: 12 mEq/L — ABNORMAL HIGH (ref 3–11)
BILIRUBIN TOTAL: 0.61 mg/dL (ref 0.20–1.20)
BUN: 45.5 mg/dL — AB (ref 7.0–26.0)
CO2: 27 mEq/L (ref 22–29)
CREATININE: 3.9 mg/dL — AB (ref 0.6–1.1)
Calcium: 9 mg/dL (ref 8.4–10.4)
Chloride: 99 mEq/L (ref 98–109)
GLUCOSE: 164 mg/dL — AB (ref 70–140)
POTASSIUM: 4.2 meq/L (ref 3.5–5.1)
Sodium: 139 mEq/L (ref 136–145)
Total Protein: 7.4 g/dL (ref 6.4–8.3)

## 2014-04-20 MED ORDER — BORTEZOMIB CHEMO SQ INJECTION 3.5 MG (2.5MG/ML)
1.3000 mg/m2 | Freq: Once | INTRAMUSCULAR | Status: AC
  Administered 2014-04-20: 2.75 mg via SUBCUTANEOUS
  Filled 2014-04-20: qty 2.75

## 2014-04-20 MED ORDER — ONDANSETRON HCL 8 MG PO TABS
ORAL_TABLET | ORAL | Status: AC
  Filled 2014-04-20: qty 1

## 2014-04-20 MED ORDER — ONDANSETRON HCL 8 MG PO TABS
8.0000 mg | ORAL_TABLET | Freq: Once | ORAL | Status: AC
  Administered 2014-04-20: 8 mg via ORAL

## 2014-04-20 NOTE — Patient Instructions (Signed)
[facility] Discharge Instructions for Patients Receiving Chemotherapy  Today you received the following chemotherapy agents:  Velcade  To help prevent nausea and vomiting after your treatment, we encourage you to take your nausea medication as ordered per MD.   If you develop nausea and vomiting that is not controlled by your nausea medication, call the clinic.   BELOW ARE SYMPTOMS THAT SHOULD BE REPORTED IMMEDIATELY:  *FEVER GREATER THAN 100.5 F  *CHILLS WITH OR WITHOUT FEVER  NAUSEA AND VOMITING THAT IS NOT CONTROLLED WITH YOUR NAUSEA MEDICATION  *UNUSUAL SHORTNESS OF BREATH  *UNUSUAL BRUISING OR BLEEDING  TENDERNESS IN MOUTH AND THROAT WITH OR WITHOUT PRESENCE OF ULCERS  *URINARY PROBLEMS  *BOWEL PROBLEMS  UNUSUAL RASH Items with * indicate a potential emergency and should be followed up as soon as possible.  Feel free to call the clinic you have any questions or concerns. The clinic phone number is [phone].    

## 2014-04-20 NOTE — Progress Notes (Signed)
0915-OK to treat with BUN-45.5 and Creatinine of 3.9 per Dr. Julien Nordmann.

## 2014-04-27 ENCOUNTER — Other Ambulatory Visit (HOSPITAL_BASED_OUTPATIENT_CLINIC_OR_DEPARTMENT_OTHER): Payer: Medicare Other

## 2014-04-27 ENCOUNTER — Ambulatory Visit (HOSPITAL_BASED_OUTPATIENT_CLINIC_OR_DEPARTMENT_OTHER): Payer: Medicare Other

## 2014-04-27 DIAGNOSIS — Z5112 Encounter for antineoplastic immunotherapy: Secondary | ICD-10-CM

## 2014-04-27 DIAGNOSIS — C9000 Multiple myeloma not having achieved remission: Secondary | ICD-10-CM

## 2014-04-27 DIAGNOSIS — C9002 Multiple myeloma in relapse: Secondary | ICD-10-CM

## 2014-04-27 LAB — CBC WITH DIFFERENTIAL/PLATELET
BASO%: 0.1 % (ref 0.0–2.0)
BASOS ABS: 0 10*3/uL (ref 0.0–0.1)
EOS%: 1.4 % (ref 0.0–7.0)
Eosinophils Absolute: 0.1 10*3/uL (ref 0.0–0.5)
HEMATOCRIT: 32.2 % — AB (ref 34.8–46.6)
HEMOGLOBIN: 10.7 g/dL — AB (ref 11.6–15.9)
LYMPH%: 20 % (ref 14.0–49.7)
MCH: 30.3 pg (ref 25.1–34.0)
MCHC: 33.2 g/dL (ref 31.5–36.0)
MCV: 91.2 fL (ref 79.5–101.0)
MONO#: 0.6 10*3/uL (ref 0.1–0.9)
MONO%: 7.8 % (ref 0.0–14.0)
NEUT%: 70.7 % (ref 38.4–76.8)
NEUTROS ABS: 5.5 10*3/uL (ref 1.5–6.5)
Platelets: 226 10*3/uL (ref 145–400)
RBC: 3.53 10*6/uL — ABNORMAL LOW (ref 3.70–5.45)
RDW: 19.4 % — AB (ref 11.2–14.5)
WBC: 7.8 10*3/uL (ref 3.9–10.3)
lymph#: 1.6 10*3/uL (ref 0.9–3.3)

## 2014-04-27 LAB — COMPREHENSIVE METABOLIC PANEL (CC13)
ALT: 13 U/L (ref 0–55)
ANION GAP: 15 meq/L — AB (ref 3–11)
AST: 14 U/L (ref 5–34)
Albumin: 3.7 g/dL (ref 3.5–5.0)
Alkaline Phosphatase: 112 U/L (ref 40–150)
BUN: 69.6 mg/dL — AB (ref 7.0–26.0)
CALCIUM: 9.8 mg/dL (ref 8.4–10.4)
CHLORIDE: 99 meq/L (ref 98–109)
CO2: 26 mEq/L (ref 22–29)
CREATININE: 5.6 mg/dL — AB (ref 0.6–1.1)
Glucose: 159 mg/dl — ABNORMAL HIGH (ref 70–140)
Potassium: 4.2 mEq/L (ref 3.5–5.1)
Sodium: 140 mEq/L (ref 136–145)
Total Bilirubin: 0.66 mg/dL (ref 0.20–1.20)
Total Protein: 7.8 g/dL (ref 6.4–8.3)

## 2014-04-27 MED ORDER — ONDANSETRON HCL 8 MG PO TABS
ORAL_TABLET | ORAL | Status: AC
  Filled 2014-04-27: qty 1

## 2014-04-27 MED ORDER — BORTEZOMIB CHEMO SQ INJECTION 3.5 MG (2.5MG/ML)
1.3000 mg/m2 | Freq: Once | INTRAMUSCULAR | Status: AC
  Administered 2014-04-27: 2.75 mg via SUBCUTANEOUS
  Filled 2014-04-27: qty 2.75

## 2014-04-27 MED ORDER — ONDANSETRON HCL 8 MG PO TABS
8.0000 mg | ORAL_TABLET | Freq: Once | ORAL | Status: AC
  Administered 2014-04-27: 8 mg via ORAL

## 2014-04-27 NOTE — Patient Instructions (Signed)
[facility] Discharge Instructions for Patients Receiving Chemotherapy  Today you received the following chemotherapy agents: Velcade. To help prevent nausea and vomiting after your treatment, we encourage you to take your nausea medication.  If you develop nausea and vomiting that is not controlled by your nausea medication, call the clinic.   BELOW ARE SYMPTOMS THAT SHOULD BE REPORTED IMMEDIATELY:  *FEVER GREATER THAN 100.5 F  *CHILLS WITH OR WITHOUT FEVER  NAUSEA AND VOMITING THAT IS NOT CONTROLLED WITH YOUR NAUSEA MEDICATION  *UNUSUAL SHORTNESS OF BREATH  *UNUSUAL BRUISING OR BLEEDING  TENDERNESS IN MOUTH AND THROAT WITH OR WITHOUT PRESENCE OF ULCERS  *URINARY PROBLEMS  *BOWEL PROBLEMS  UNUSUAL RASH Items with * indicate a potential emergency and should be followed up as soon as possible.  Feel free to call the clinic you have any questions or concerns. The clinic phone number is [phone].    

## 2014-05-04 ENCOUNTER — Telehealth: Payer: Self-pay | Admitting: Internal Medicine

## 2014-05-04 ENCOUNTER — Telehealth: Payer: Self-pay | Admitting: *Deleted

## 2014-05-04 ENCOUNTER — Encounter: Payer: Self-pay | Admitting: Internal Medicine

## 2014-05-04 ENCOUNTER — Other Ambulatory Visit (HOSPITAL_BASED_OUTPATIENT_CLINIC_OR_DEPARTMENT_OTHER): Payer: Medicare Other

## 2014-05-04 ENCOUNTER — Ambulatory Visit (HOSPITAL_BASED_OUTPATIENT_CLINIC_OR_DEPARTMENT_OTHER): Payer: Medicare Other | Admitting: Internal Medicine

## 2014-05-04 ENCOUNTER — Ambulatory Visit (HOSPITAL_COMMUNITY)
Admission: RE | Admit: 2014-05-04 | Discharge: 2014-05-04 | Disposition: A | Discharge status: Home / Self Care | Payer: Medicare Other | Source: Ambulatory Visit | Attending: Internal Medicine | Admitting: Internal Medicine

## 2014-05-04 ENCOUNTER — Ambulatory Visit (HOSPITAL_BASED_OUTPATIENT_CLINIC_OR_DEPARTMENT_OTHER): Payer: Medicare Other

## 2014-05-04 ENCOUNTER — Other Ambulatory Visit: Payer: Self-pay | Admitting: *Deleted

## 2014-05-04 VITALS — BP 122/54 | HR 83 | Temp 98.3°F | Resp 18 | Ht 65.0 in | Wt 204.7 lb

## 2014-05-04 DIAGNOSIS — I82531 Chronic embolism and thrombosis of right popliteal vein: Secondary | ICD-10-CM | POA: Insufficient documentation

## 2014-05-04 DIAGNOSIS — Z5112 Encounter for antineoplastic immunotherapy: Secondary | ICD-10-CM

## 2014-05-04 DIAGNOSIS — C9000 Multiple myeloma not having achieved remission: Secondary | ICD-10-CM

## 2014-05-04 DIAGNOSIS — Z86718 Personal history of other venous thrombosis and embolism: Secondary | ICD-10-CM

## 2014-05-04 DIAGNOSIS — M79605 Pain in left leg: Secondary | ICD-10-CM

## 2014-05-04 DIAGNOSIS — C9002 Multiple myeloma in relapse: Secondary | ICD-10-CM

## 2014-05-04 DIAGNOSIS — M79609 Pain in unspecified limb: Secondary | ICD-10-CM

## 2014-05-04 DIAGNOSIS — N189 Chronic kidney disease, unspecified: Secondary | ICD-10-CM

## 2014-05-04 DIAGNOSIS — D631 Anemia in chronic kidney disease: Secondary | ICD-10-CM

## 2014-05-04 DIAGNOSIS — M7989 Other specified soft tissue disorders: Secondary | ICD-10-CM

## 2014-05-04 LAB — CBC WITH DIFFERENTIAL/PLATELET
BASO%: 0.1 % (ref 0.0–2.0)
Basophils Absolute: 0 10*3/uL (ref 0.0–0.1)
EOS%: 1.7 % (ref 0.0–7.0)
Eosinophils Absolute: 0.1 10*3/uL (ref 0.0–0.5)
HCT: 32.5 % — ABNORMAL LOW (ref 34.8–46.6)
HGB: 10.5 g/dL — ABNORMAL LOW (ref 11.6–15.9)
LYMPH%: 21.2 % (ref 14.0–49.7)
MCH: 30.1 pg (ref 25.1–34.0)
MCHC: 32.3 g/dL (ref 31.5–36.0)
MCV: 93.1 fL (ref 79.5–101.0)
MONO#: 0.6 10*3/uL (ref 0.1–0.9)
MONO%: 8.3 % (ref 0.0–14.0)
NEUT%: 68.7 % (ref 38.4–76.8)
NEUTROS ABS: 5.2 10*3/uL (ref 1.5–6.5)
Platelets: 212 10*3/uL (ref 145–400)
RBC: 3.49 10*6/uL — AB (ref 3.70–5.45)
RDW: 19.2 % — AB (ref 11.2–14.5)
WBC: 7.5 10*3/uL (ref 3.9–10.3)
lymph#: 1.6 10*3/uL (ref 0.9–3.3)

## 2014-05-04 LAB — COMPREHENSIVE METABOLIC PANEL (CC13)
ALT: 19 U/L (ref 0–55)
AST: 17 U/L (ref 5–34)
Albumin: 3.5 g/dL (ref 3.5–5.0)
Alkaline Phosphatase: 103 U/L (ref 40–150)
Anion Gap: 14 mEq/L — ABNORMAL HIGH (ref 3–11)
BILIRUBIN TOTAL: 0.58 mg/dL (ref 0.20–1.20)
BUN: 47.5 mg/dL — AB (ref 7.0–26.0)
CALCIUM: 9.1 mg/dL (ref 8.4–10.4)
CHLORIDE: 100 meq/L (ref 98–109)
CO2: 26 meq/L (ref 22–29)
CREATININE: 4.7 mg/dL — AB (ref 0.6–1.1)
GLUCOSE: 171 mg/dL — AB (ref 70–140)
Potassium: 4 mEq/L (ref 3.5–5.1)
Sodium: 140 mEq/L (ref 136–145)
Total Protein: 7.4 g/dL (ref 6.4–8.3)

## 2014-05-04 MED ORDER — ONDANSETRON HCL 8 MG PO TABS
ORAL_TABLET | ORAL | Status: AC
  Filled 2014-05-04: qty 1

## 2014-05-04 MED ORDER — ONDANSETRON HCL 8 MG PO TABS
8.0000 mg | ORAL_TABLET | Freq: Once | ORAL | Status: AC
  Administered 2014-05-04: 8 mg via ORAL

## 2014-05-04 MED ORDER — ACYCLOVIR 400 MG PO TABS: 400.0000 mg | ORAL_TABLET | Freq: Every day | ORAL | Status: DC

## 2014-05-04 MED ORDER — BORTEZOMIB CHEMO SQ INJECTION 3.5 MG (2.5MG/ML)
1.3000 mg/m2 | Freq: Once | INTRAMUSCULAR | Status: AC
  Administered 2014-05-04: 2.75 mg via SUBCUTANEOUS
  Filled 2014-05-04: qty 2.75

## 2014-05-04 NOTE — Progress Notes (Signed)
VASCULAR LAB PRELIMINARY  PRELIMINARY  PRELIMINARY  PRELIMINARY  Bilateral lower extremity venous duplex  completed.    Preliminary report:  Right:  Non occlusive, chronic DVT noted in the popliteal vein.  No evidence of superficial thrombosis.  No Baker's cyst.  Left:  No evidence of DVT, superficial thrombosis, or Baker's cyst.  Kunio Cummiskey, RVT 05/04/2014, 12:56 PM

## 2014-05-04 NOTE — Telephone Encounter (Signed)
gv adn printeda ppt sched and avs for pt for Dec and Jan 2016 °

## 2014-05-04 NOTE — Patient Instructions (Signed)
[facility] Discharge [facility] for Patients Receiving Chemotherapy  Today you received the following chemotherapy agents: Velcade.  To help prevent nausea and vomiting after your treatment, we encourage you to take your nausea medication as prescribed.   If you develop nausea and vomiting that is not controlled by your nausea medication, call the clinic.   BELOW ARE SYMPTOMS THAT SHOULD BE REPORTED IMMEDIATELY:  *FEVER GREATER THAN 100.5 F  *CHILLS WITH OR WITHOUT FEVER  NAUSEA AND VOMITING THAT IS NOT CONTROLLED WITH YOUR NAUSEA MEDICATION  *UNUSUAL SHORTNESS OF BREATH  *UNUSUAL BRUISING OR BLEEDING  TENDERNESS IN MOUTH AND THROAT WITH OR WITHOUT PRESENCE OF ULCERS  *URINARY PROBLEMS  *BOWEL PROBLEMS  UNUSUAL RASH Items with * indicate a potential emergency and should be followed up as soon as possible.  Feel free to call the clinic you have any questions or concerns. The clinic phone number is [phone].    

## 2014-05-04 NOTE — Telephone Encounter (Signed)
Received preliminary report from vasc lab regarding bilateral LE dopplers, results given to Dr Vista Mink.  No further orders at this time.

## 2014-05-04 NOTE — Progress Notes (Signed)
[facility] [place] Telephone:([phone]   [phone]  OFFICE PROGRESS NOTE  [name], MD [place] [address]  DIAGNOSIS:  #1 plasma cell dyscrasia, multiple myeloma.  #2 anemia of chronic disease secondary to chronic kidney disease.   PRIOR THERAPY: Treatment with single agent subcutaneous Velcade 1.3 mg/m2 on a weekly basis with Decadron 40 mg by mouth weekly, status post 21 cycles. First cycle to be given 07/15/2013   CURRENT THERAPY: Observation.  INTERVAL HISTORY: [name] 80 y.o. female returns to the clinic today for followup visit. She is tolerating her treatment with subcutaneous Velcade and Decadron fairly well with no significant complaints. The patient is currently on hemodialysis on Monday, Wednesday and Friday. She is tolerating her dialysis well except for spasms in the upper extremities. She also complains today of pain in the left lower extremity up to the thigh. She denied having any fever or chills, no nausea or vomiting. The patient denied having any significant chest pain, shortness of breath, cough or hemoptysis. She has no significant weight loss or night sweats.  MEDICAL HISTORY: Past Medical History  Diagnosis Date  . Hypertension   . Asthma   . GERD (gastroesophageal reflux disease)   . Neuropathy in diabetes     Hx: of  . COPD (chronic obstructive pulmonary disease)   . Anemia   . High cholesterol   . Pulmonary embolism 1987  . Type II diabetes mellitus   . History of blood transfusion 1987; 2011    "related to lung OR; HgB went down to 5"  . Arthritis     "hips, knees, shoulders" (11/10/2013)  . Chronic lower back pain   . Gout   . ESRD (end stage renal disease) on dialysis     "1st treatment 11/09/2013"  . Kidney stones   . Multiple myeloma     ALLERGIES:  is allergic to penicillins cross reactors.  MEDICATIONS:  Current Outpatient Prescriptions  Medication Sig Dispense  Refill  . acyclovir (ZOVIRAX) 400 MG tablet Take 400 mg by mouth daily.    Marland Kitchen albuterol (PROVENTIL HFA;VENTOLIN HFA) 108 (90 BASE) MCG/ACT inhaler Inhale 1 puff into the lungs 2 (two) times daily as needed for wheezing or shortness of breath.    . allopurinol (ZYLOPRIM) 100 MG tablet Take 100 mg by mouth daily with breakfast.     . amLODipine (NORVASC) 5 MG tablet Take 5 mg by mouth daily with breakfast.     . cyclobenzaprine (FLEXERIL) 5 MG tablet   0  . gabapentin (NEURONTIN) 100 MG capsule Take 100 mg by mouth 3 (three) times daily.    Marland Kitchen glimepiride (AMARYL) 1 MG tablet Take 1 mg by mouth daily with breakfast.     . HYDROcodone-acetaminophen (NORCO) 7.5-325 MG per tablet Take 1 tablet by mouth every 6 (six) hours as needed for moderate pain.    Marland Kitchen lidocaine-prilocaine (EMLA) cream     . Multiple Vitamins-Minerals (MULTIVITAMIN WITH MINERALS) tablet Take 2 tablets by mouth daily.    . multivitamin (RENA-VIT) TABS tablet Take 1 tablet by mouth daily.    . ondansetron (ZOFRAN) 8 MG tablet Take 1 tablet (8 mg total) by mouth every 8 (eight) hours as needed for nausea or vomiting. 20 tablet 1  . ONE TOUCH ULTRA TEST test strip     . Oxycodone HCl 20 MG TABS Take 20 mg by mouth every 12 (twelve) hours.     . pantoprazole (  PROTONIX) 40 MG tablet Take 40 mg by mouth 2 (two) times daily.     . saxagliptin HCl (ONGLYZA) 2.5 MG TABS tablet Take 2.5 mg by mouth daily with breakfast.      No current facility-administered medications for this visit.    SURGICAL HISTORY:  Past Surgical History  Procedure Laterality Date  . Lung removal, partial Left 1987  . Cholecystectomy    . Av fistula placement Left 2011    "lower arm; never used"  . Colonoscopy    . Abdominoplasty  ~ 1983    "tummy tuck"  . Abdominal hysterectomy  1964  . Av fistula placement Left 05/15/2013    Procedure: INSERTION OF ARTERIOVENOUS (AV) GORE-TEX GRAFT ARM-LEFT UPPER ARM;  Surgeon: Pryor Ochoa, MD;  Location: [facility] OR;   Service: Vascular;  Laterality: Left;  . Appendectomy  1954  . Umbilical hernia repair  ~ 1983  . Hernia repair    . Tubal ligation  1964  . Cystoscopy w/ stone manipulation  1980's    REVIEW OF SYSTEMS:  Constitutional: positive for fatigue Eyes: negative Ears, nose, mouth, throat, and face: negative Respiratory: negative Cardiovascular: negative Gastrointestinal: negative Genitourinary:negative Integument/breast: negative Hematologic/lymphatic: negative Musculoskeletal:negative Neurological: negative Behavioral/Psych: negative Endocrine: negative Allergic/Immunologic: negative   PHYSICAL EXAMINATION: General appearance: alert, cooperative, fatigued and no distress Head: Normocephalic, without obvious abnormality, atraumatic Neck: no adenopathy Lymph nodes: Cervical, supraclavicular, and axillary nodes normal. Resp: clear to auscultation bilaterally Back: symmetric, no curvature. ROM normal. No CVA tenderness. Cardio: regular rate and rhythm, S1, S2 normal, no murmur, click, rub or gallop GI: soft, non-tender; bowel sounds normal; no masses,  no organomegaly Extremities: extremities normal, atraumatic, no cyanosis or edema Neurologic: Alert and oriented X 3, normal strength and tone. Normal symmetric reflexes. Normal coordination and gait  ECOG PERFORMANCE STATUS: 2 - Symptomatic, <50% confined to bed  Blood pressure 122/54, pulse 83, temperature 98.3 F (36.8 C), temperature source Oral, resp. rate 18, height 5\' 5"  (1.651 m), weight 204 lb 11.2 oz (92.851 kg).  LABORATORY DATA: Lab Results  Component Value Date   WBC 7.5 05/04/2014   HGB 10.5* 05/04/2014   HCT 32.5* 05/04/2014   MCV 93.1 05/04/2014   PLT 212 05/04/2014      Chemistry      Component Value Date/Time   NA 140 05/04/2014 0857   NA 143 11/10/2013 0748   K 4.0 05/04/2014 0857   K 3.4* 11/10/2013 0748   CL 98 11/10/2013 0748   CL 98 09/17/2012 1242   CO2 26 05/04/2014 0857   CO2 30 11/10/2013  0748   BUN 47.5* 05/04/2014 0857   BUN 80* 11/10/2013 0748   CREATININE 4.7* 05/04/2014 0857   CREATININE 3.66* 11/10/2013 0748      Component Value Date/Time   CALCIUM 9.1 05/04/2014 0857   CALCIUM 9.5 11/10/2013 0748   CALCIUM 8.6 12/02/2012 0933   ALKPHOS 103 05/04/2014 0857   ALKPHOS 81 03/26/2012 1643   AST 17 05/04/2014 0857   AST 23 03/26/2012 1643   ALT 19 05/04/2014 0857   ALT 14 03/26/2012 1643   BILITOT 0.58 05/04/2014 0857   BILITOT 0.2* 03/26/2012 1643     Other lab results: Showed evidence for disease progression especially with the free lambda light chain.  ASSESSMENT: This is a very pleasant 80 years old African American female with plasma cell dyscrasia completed treatment with weekly subcutaneous Velcade and Decadron status post 21 weekly doses and tolerating it fairly well  She  is tolerating her treatment with no significant complaints. I recommended for the patient to continue her current treatment with Velcade and Decadron as a scheduled. She will have repeat myeloma panel in 2 weeks and the patient would come back for follow-up visit in 3 weeks for reevaluation. For the right lower extremity swelling and pain, I recommended for the patient to have Doppler of the right lower extremity to rule out deep venous thrombosis. She has a history of the venous thrombosis of the left lower extremity several years ago. She was advised to call immediately if she has any concerning symptoms in the interval.  Disclaimer: This note was dictated with voice recognition software. Similar sounding words can inadvertently be transcribed and may not be corrected upon review.

## 2014-05-11 ENCOUNTER — Other Ambulatory Visit (HOSPITAL_BASED_OUTPATIENT_CLINIC_OR_DEPARTMENT_OTHER): Payer: Medicare Other

## 2014-05-11 ENCOUNTER — Other Ambulatory Visit: Payer: Medicare Other

## 2014-05-11 ENCOUNTER — Ambulatory Visit (HOSPITAL_BASED_OUTPATIENT_CLINIC_OR_DEPARTMENT_OTHER): Payer: Medicare Other

## 2014-05-11 ENCOUNTER — Ambulatory Visit: Payer: Medicare Other

## 2014-05-11 DIAGNOSIS — C9002 Multiple myeloma in relapse: Secondary | ICD-10-CM

## 2014-05-11 DIAGNOSIS — Z5112 Encounter for antineoplastic immunotherapy: Secondary | ICD-10-CM

## 2014-05-11 DIAGNOSIS — C9000 Multiple myeloma not having achieved remission: Secondary | ICD-10-CM

## 2014-05-11 LAB — CBC WITH DIFFERENTIAL/PLATELET
BASO%: 0.1 % (ref 0.0–2.0)
Basophils Absolute: 0 10*3/uL (ref 0.0–0.1)
EOS%: 1.4 % (ref 0.0–7.0)
Eosinophils Absolute: 0.1 10*3/uL (ref 0.0–0.5)
HCT: 31.1 % — ABNORMAL LOW (ref 34.8–46.6)
HGB: 10.3 g/dL — ABNORMAL LOW (ref 11.6–15.9)
LYMPH#: 1.3 10*3/uL (ref 0.9–3.3)
LYMPH%: 16.3 % (ref 14.0–49.7)
MCH: 31 pg (ref 25.1–34.0)
MCHC: 33.1 g/dL (ref 31.5–36.0)
MCV: 93.7 fL (ref 79.5–101.0)
MONO#: 0.8 10*3/uL (ref 0.1–0.9)
MONO%: 10 % (ref 0.0–14.0)
NEUT#: 5.5 10*3/uL (ref 1.5–6.5)
NEUT%: 72.2 % (ref 38.4–76.8)
Platelets: 208 10*3/uL (ref 145–400)
RBC: 3.32 10*6/uL — AB (ref 3.70–5.45)
RDW: 18.4 % — AB (ref 11.2–14.5)
WBC: 7.7 10*3/uL (ref 3.9–10.3)

## 2014-05-11 LAB — COMPREHENSIVE METABOLIC PANEL (CC13)
ALT: 14 U/L (ref 0–55)
AST: 13 U/L (ref 5–34)
Albumin: 3.5 g/dL (ref 3.5–5.0)
Alkaline Phosphatase: 110 U/L (ref 40–150)
Anion Gap: 14 mEq/L — ABNORMAL HIGH (ref 3–11)
BUN: 53.2 mg/dL — ABNORMAL HIGH (ref 7.0–26.0)
CALCIUM: 8.9 mg/dL (ref 8.4–10.4)
CO2: 28 meq/L (ref 22–29)
Chloride: 99 mEq/L (ref 98–109)
Creatinine: 5.2 mg/dL (ref 0.6–1.1)
EGFR: 8 mL/min/{1.73_m2} — ABNORMAL LOW (ref >90)
GLUCOSE: 110 mg/dL (ref 70–140)
Potassium: 4.1 mEq/L (ref 3.5–5.1)
SODIUM: 141 meq/L (ref 136–145)
Total Bilirubin: 0.58 mg/dL (ref 0.20–1.20)
Total Protein: 7.2 g/dL (ref 6.4–8.3)

## 2014-05-11 MED ORDER — ONDANSETRON HCL 8 MG PO TABS
ORAL_TABLET | ORAL | Status: AC
  Filled 2014-05-11: qty 1

## 2014-05-11 MED ORDER — BORTEZOMIB CHEMO SQ INJECTION 3.5 MG (2.5MG/ML)
1.3000 mg/m2 | Freq: Once | INTRAMUSCULAR | Status: AC
  Administered 2014-05-11: 2.75 mg via SUBCUTANEOUS
  Filled 2014-05-11: qty 2.75

## 2014-05-11 MED ORDER — ONDANSETRON HCL 8 MG PO TABS
8.0000 mg | ORAL_TABLET | Freq: Once | ORAL | Status: AC
  Administered 2014-05-11: 8 mg via ORAL

## 2014-05-11 NOTE — Patient Instructions (Signed)
[facility] Discharge Instructions for Patients Receiving Chemotherapy  Today you received the following chemotherapy agents: Velcade.  To help prevent nausea and vomiting after your treatment, we encourage you to take your nausea medication as prescribed.   If you develop nausea and vomiting that is not controlled by your nausea medication, call the clinic.   BELOW ARE SYMPTOMS THAT SHOULD BE REPORTED IMMEDIATELY:  *FEVER GREATER THAN 100.5 F  *CHILLS WITH OR WITHOUT FEVER  NAUSEA AND VOMITING THAT IS NOT CONTROLLED WITH YOUR NAUSEA MEDICATION  *UNUSUAL SHORTNESS OF BREATH  *UNUSUAL BRUISING OR BLEEDING  TENDERNESS IN MOUTH AND THROAT WITH OR WITHOUT PRESENCE OF ULCERS  *URINARY PROBLEMS  *BOWEL PROBLEMS  UNUSUAL RASH Items with * indicate a potential emergency and should be followed up as soon as possible.  Feel free to call the clinic you have any questions or concerns. The clinic phone number is [phone].    

## 2014-05-11 NOTE — Progress Notes (Signed)
Per Dr.Mohamed, proceed with treatment, aware of Creatine 5.2

## 2014-05-18 ENCOUNTER — Telehealth: Payer: Self-pay | Admitting: *Deleted

## 2014-05-18 ENCOUNTER — Ambulatory Visit (HOSPITAL_BASED_OUTPATIENT_CLINIC_OR_DEPARTMENT_OTHER): Payer: Medicare Other

## 2014-05-18 ENCOUNTER — Other Ambulatory Visit (HOSPITAL_BASED_OUTPATIENT_CLINIC_OR_DEPARTMENT_OTHER): Payer: Medicare Other

## 2014-05-18 DIAGNOSIS — M79605 Pain in left leg: Secondary | ICD-10-CM

## 2014-05-18 DIAGNOSIS — C9000 Multiple myeloma not having achieved remission: Secondary | ICD-10-CM

## 2014-05-18 DIAGNOSIS — Z5112 Encounter for antineoplastic immunotherapy: Secondary | ICD-10-CM

## 2014-05-18 LAB — COMPREHENSIVE METABOLIC PANEL (CC13)
ALK PHOS: 105 U/L (ref 40–150)
ALT: 25 U/L (ref 0–55)
AST: 41 U/L — AB (ref 5–34)
Albumin: 3.4 g/dL — ABNORMAL LOW (ref 3.5–5.0)
Anion Gap: 13 mEq/L — ABNORMAL HIGH (ref 3–11)
BUN: 38 mg/dL — AB (ref 7.0–26.0)
CHLORIDE: 100 meq/L (ref 98–109)
CO2: 27 mEq/L (ref 22–29)
Calcium: 8.7 mg/dL (ref 8.4–10.4)
Creatinine: 4.8 mg/dL (ref 0.6–1.1)
EGFR: 9 mL/min/{1.73_m2} — ABNORMAL LOW (ref >90)
Glucose: 232 mg/dl — ABNORMAL HIGH (ref 70–140)
Potassium: 4 mEq/L (ref 3.5–5.1)
SODIUM: 140 meq/L (ref 136–145)
TOTAL PROTEIN: 7.3 g/dL (ref 6.4–8.3)
Total Bilirubin: 0.56 mg/dL (ref 0.20–1.20)

## 2014-05-18 LAB — CBC WITH DIFFERENTIAL/PLATELET
BASO%: 0.6 % (ref 0.0–2.0)
Basophils Absolute: 0 10*3/uL (ref 0.0–0.1)
EOS%: 1 % (ref 0.0–7.0)
Eosinophils Absolute: 0.1 10*3/uL (ref 0.0–0.5)
HCT: 31.4 % — ABNORMAL LOW (ref 34.8–46.6)
HEMOGLOBIN: 10.2 g/dL — AB (ref 11.6–15.9)
LYMPH#: 0.8 10*3/uL — AB (ref 0.9–3.3)
LYMPH%: 10.6 % — ABNORMAL LOW (ref 14.0–49.7)
MCH: 31.5 pg (ref 25.1–34.0)
MCHC: 32.3 g/dL (ref 31.5–36.0)
MCV: 97.4 fL (ref 79.5–101.0)
MONO#: 0.6 10*3/uL (ref 0.1–0.9)
MONO%: 7.4 % (ref 0.0–14.0)
NEUT#: 6.2 10*3/uL (ref 1.5–6.5)
NEUT%: 80.4 % — ABNORMAL HIGH (ref 38.4–76.8)
Platelets: 236 10*3/uL (ref 145–400)
RBC: 3.23 10*6/uL — ABNORMAL LOW (ref 3.70–5.45)
RDW: 18.8 % — AB (ref 11.2–14.5)
WBC: 7.8 10*3/uL (ref 3.9–10.3)

## 2014-05-18 LAB — LACTATE DEHYDROGENASE (CC13): LDH: 230 U/L (ref 125–245)

## 2014-05-18 MED ORDER — ONDANSETRON HCL 8 MG PO TABS
8.0000 mg | ORAL_TABLET | Freq: Once | ORAL | Status: AC
  Administered 2014-05-18: 8 mg via ORAL

## 2014-05-18 MED ORDER — ONDANSETRON HCL 8 MG PO TABS
ORAL_TABLET | ORAL | Status: AC
  Filled 2014-05-18: qty 1

## 2014-05-18 MED ORDER — BORTEZOMIB CHEMO SQ INJECTION 3.5 MG (2.5MG/ML)
1.3000 mg/m2 | Freq: Once | INTRAMUSCULAR | Status: AC
  Administered 2014-05-18: 2.75 mg via SUBCUTANEOUS
  Filled 2014-05-18: qty 2.75

## 2014-05-18 NOTE — Patient Instructions (Signed)
[facility] Discharge Instructions for Patients Receiving Chemotherapy  Today you received the following chemotherapy agents:  Velcade  To help prevent nausea and vomiting after your treatment, we encourage you to take your nausea medication as ordered per MD.   If you develop nausea and vomiting that is not controlled by your nausea medication, call the clinic.   BELOW ARE SYMPTOMS THAT SHOULD BE REPORTED IMMEDIATELY:  *FEVER GREATER THAN 100.5 F  *CHILLS WITH OR WITHOUT FEVER  NAUSEA AND VOMITING THAT IS NOT CONTROLLED WITH YOUR NAUSEA MEDICATION  *UNUSUAL SHORTNESS OF BREATH  *UNUSUAL BRUISING OR BLEEDING  TENDERNESS IN MOUTH AND THROAT WITH OR WITHOUT PRESENCE OF ULCERS  *URINARY PROBLEMS  *BOWEL PROBLEMS  UNUSUAL RASH Items with * indicate a potential emergency and should be followed up as soon as possible.  Feel free to call the clinic you have any questions or concerns. The clinic phone number is [phone].    

## 2014-05-18 NOTE — Telephone Encounter (Signed)
Critical lab Cr given to Dr Vista Mink, no further orders at this time.

## 2014-05-20 LAB — KAPPA/LAMBDA LIGHT CHAINS
Kappa free light chain: 9.66 mg/dL — ABNORMAL HIGH (ref 0.33–1.94)
Kappa:Lambda Ratio: 0.72 (ref 0.26–1.65)
LAMBDA FREE LGHT CHN: 13.5 mg/dL — AB (ref 0.57–2.63)

## 2014-05-20 LAB — BETA 2 MICROGLOBULIN, SERUM: Beta-2 Microglobulin: 21.2 mg/L — ABNORMAL HIGH (ref <=2.51)

## 2014-05-20 LAB — IGG, IGA, IGM
IGG (IMMUNOGLOBIN G), SERUM: 1250 mg/dL (ref 690–1700)
IgA: 132 mg/dL (ref 69–380)
IgM, Serum: 109 mg/dL (ref 52–322)

## 2014-05-21 NOTE — Telephone Encounter (Signed)
error 

## 2014-05-25 ENCOUNTER — Telehealth: Payer: Self-pay | Admitting: *Deleted

## 2014-05-25 ENCOUNTER — Ambulatory Visit (HOSPITAL_BASED_OUTPATIENT_CLINIC_OR_DEPARTMENT_OTHER): Payer: Medicare Other | Admitting: Physician Assistant

## 2014-05-25 ENCOUNTER — Encounter: Payer: Self-pay | Admitting: Physician Assistant

## 2014-05-25 ENCOUNTER — Ambulatory Visit (HOSPITAL_BASED_OUTPATIENT_CLINIC_OR_DEPARTMENT_OTHER): Payer: Medicare Other | Admitting: Lab

## 2014-05-25 ENCOUNTER — Ambulatory Visit: Payer: Medicare Other

## 2014-05-25 ENCOUNTER — Telehealth: Payer: Self-pay | Admitting: Physician Assistant

## 2014-05-25 VITALS — BP 121/60 | HR 78 | Temp 98.2°F | Resp 18 | Ht 65.0 in | Wt 210.1 lb

## 2014-05-25 DIAGNOSIS — C9000 Multiple myeloma not having achieved remission: Secondary | ICD-10-CM

## 2014-05-25 DIAGNOSIS — Z5112 Encounter for antineoplastic immunotherapy: Secondary | ICD-10-CM

## 2014-05-25 DIAGNOSIS — N186 End stage renal disease: Secondary | ICD-10-CM

## 2014-05-25 DIAGNOSIS — C9002 Multiple myeloma in relapse: Secondary | ICD-10-CM

## 2014-05-25 DIAGNOSIS — D631 Anemia in chronic kidney disease: Secondary | ICD-10-CM

## 2014-05-25 LAB — COMPREHENSIVE METABOLIC PANEL (CC13)
ALBUMIN: 3.4 g/dL — AB (ref 3.5–5.0)
ALK PHOS: 104 U/L (ref 40–150)
ALT: 18 U/L (ref 0–55)
AST: 17 U/L (ref 5–34)
Anion Gap: 13 mEq/L — ABNORMAL HIGH (ref 3–11)
BILIRUBIN TOTAL: 0.42 mg/dL (ref 0.20–1.20)
BUN: 32.9 mg/dL — AB (ref 7.0–26.0)
CO2: 29 mEq/L (ref 22–29)
CREATININE: 3.8 mg/dL — AB (ref 0.6–1.1)
Calcium: 8.6 mg/dL (ref 8.4–10.4)
Chloride: 100 mEq/L (ref 98–109)
EGFR: 12 mL/min/{1.73_m2} — AB (ref >90)
GLUCOSE: 160 mg/dL — AB (ref 70–140)
POTASSIUM: 4.3 meq/L (ref 3.5–5.1)
Sodium: 141 mEq/L (ref 136–145)
Total Protein: 7.2 g/dL (ref 6.4–8.3)

## 2014-05-25 LAB — CBC WITH DIFFERENTIAL/PLATELET
BASO%: 0.4 % (ref 0.0–2.0)
Basophils Absolute: 0 10*3/uL (ref 0.0–0.1)
EOS%: 1.1 % (ref 0.0–7.0)
Eosinophils Absolute: 0.1 10*3/uL (ref 0.0–0.5)
HCT: 32.2 % — ABNORMAL LOW (ref 34.8–46.6)
HGB: 10.4 g/dL — ABNORMAL LOW (ref 11.6–15.9)
LYMPH%: 11.4 % — AB (ref 14.0–49.7)
MCH: 31.7 pg (ref 25.1–34.0)
MCHC: 32.5 g/dL (ref 31.5–36.0)
MCV: 97.5 fL (ref 79.5–101.0)
MONO#: 0.6 10*3/uL (ref 0.1–0.9)
MONO%: 7.4 % (ref 0.0–14.0)
NEUT#: 6.8 10*3/uL — ABNORMAL HIGH (ref 1.5–6.5)
NEUT%: 79.7 % — AB (ref 38.4–76.8)
Platelets: 230 10*3/uL (ref 145–400)
RBC: 3.3 10*6/uL — ABNORMAL LOW (ref 3.70–5.45)
RDW: 18.8 % — AB (ref 11.2–14.5)
WBC: 8.5 10*3/uL (ref 3.9–10.3)
lymph#: 1 10*3/uL (ref 0.9–3.3)

## 2014-05-25 MED ORDER — ONDANSETRON HCL 8 MG PO TABS
8.0000 mg | ORAL_TABLET | Freq: Once | ORAL | Status: AC
  Administered 2014-05-25: 8 mg via ORAL

## 2014-05-25 MED ORDER — BORTEZOMIB CHEMO SQ INJECTION 3.5 MG (2.5MG/ML)
1.3000 mg/m2 | Freq: Once | INTRAMUSCULAR | Status: AC
  Administered 2014-05-25: 2.75 mg via SUBCUTANEOUS
  Filled 2014-05-25: qty 2.75

## 2014-05-25 MED ORDER — ONDANSETRON HCL 8 MG PO TABS
ORAL_TABLET | ORAL | Status: AC
  Filled 2014-05-25: qty 1

## 2014-05-25 NOTE — Progress Notes (Addendum)
[place] [phone]   [phone]  OFFICE PROGRESS NOTE  [name], MD [place] [address]  DIAGNOSIS:  #1 plasma cell dyscrasia, multiple myeloma.  #2 anemia of chronic disease secondary to chronic kidney disease.   PRIOR THERAPY: Treatment with single agent subcutaneous Velcade 1.3 mg/m2 on a weekly basis with Decadron 40 mg by mouth weekly, status post 21 cycles. First cycle to be given 07/15/2013   CURRENT THERAPY:  Resumed treatment with single agent subcutaneous Velcade 1.3 mg/m2 on a weekly basis with Decadron 40 mg by mouth weekly. Therapy resumed 03/30/2014. Status post 8 cycles  INTERVAL HISTORY: [name] 80 y.o. female returns to the clinic today for followup visit. She is tolerating her treatment with subcutaneous Velcade and Decadron fairly well with no significant complaints. The patient is currently on hemodialysis on Monday, Wednesday and Friday. She is tolerating her dialysis well except for spasms in the upper extremities. She also complains today of some mild dizziness. She reports being agitated earlier today and feels this may be playing a role. She has had no changes in her blood pressure medications. She had good breakfast this morning as well as had a reasonable fluid intake.  She denied having any fever or chills, no nausea or vomiting. The patient denied having any significant chest pain, shortness of breath, cough or hemoptysis. She has no significant weight loss or night sweats. She recently had repeat protein studies to reevaluate her disease and presents to discuss the results.  MEDICAL HISTORY: Past Medical History  Diagnosis Date  . Hypertension   . Asthma   . GERD (gastroesophageal reflux disease)   . Neuropathy in diabetes     Hx: of  . COPD (chronic obstructive pulmonary disease)   . Anemia   . High cholesterol   . Pulmonary embolism 1987  . Type II diabetes  mellitus   . History of blood transfusion 1987; 2011    "related to lung OR; HgB went down to 5"  . Arthritis     "hips, knees, shoulders" (11/10/2013)  . Chronic lower back pain   . Gout   . ESRD (end stage renal disease) on dialysis     "1st treatment 11/09/2013"  . Kidney stones   . Multiple myeloma     ALLERGIES:  is allergic to penicillins cross reactors.  MEDICATIONS:  Current Outpatient Prescriptions  Medication Sig Dispense Refill  . acyclovir (ZOVIRAX) 400 MG tablet Take 1 tablet (400 mg total) by mouth daily. 30 tablet 0  . albuterol (PROVENTIL HFA;VENTOLIN HFA) 108 (90 BASE) MCG/ACT inhaler Inhale 1 puff into the lungs 2 (two) times daily as needed for wheezing or shortness of breath.    . allopurinol (ZYLOPRIM) 100 MG tablet Take 100 mg by mouth daily with breakfast.     . amLODipine (NORVASC) 5 MG tablet Take 5 mg by mouth daily with breakfast.     . cyclobenzaprine (FLEXERIL) 5 MG tablet   0  . gabapentin (NEURONTIN) 100 MG capsule Take 100 mg by mouth 3 (three) times daily.    Marland Kitchen glimepiride (AMARYL) 1 MG tablet Take 1 mg by mouth daily with breakfast.     . HYDROcodone-acetaminophen (NORCO) 7.5-325 MG per tablet Take 1 tablet by mouth every 6 (six) hours as needed for moderate pain.    Marland Kitchen lidocaine-prilocaine (EMLA) cream     . Multiple Vitamins-Minerals (MULTIVITAMIN WITH MINERALS) tablet Take 2 tablets by  mouth daily.    . multivitamin (RENA-VIT) TABS tablet Take 1 tablet by mouth daily.    . ondansetron (ZOFRAN) 8 MG tablet Take 1 tablet (8 mg total) by mouth every 8 (eight) hours as needed for nausea or vomiting. 20 tablet 1  . ONE TOUCH ULTRA TEST test strip     . Oxycodone HCl 20 MG TABS Take 20 mg by mouth every 12 (twelve) hours.     . pantoprazole (PROTONIX) 40 MG tablet Take 40 mg by mouth 2 (two) times daily.     . saxagliptin HCl (ONGLYZA) 2.5 MG TABS tablet Take 2.5 mg by mouth daily with breakfast.      No current facility-administered medications for this  visit.    SURGICAL HISTORY:  Past Surgical History  Procedure Laterality Date  . Lung removal, partial Left 1987  . Cholecystectomy    . Av fistula placement Left 2011    "lower arm; never used"  . Colonoscopy    . Abdominoplasty  ~ 1983    "tummy tuck"  . Abdominal hysterectomy  1964  . Av fistula placement Left 05/15/2013    Procedure: INSERTION OF ARTERIOVENOUS (AV) GORE-TEX GRAFT ARM-LEFT UPPER ARM;  Surgeon: Mal Misty, MD;  Location: [facility];  Service: Vascular;  Laterality: Left;  . Appendectomy  1954  . Umbilical hernia repair  ~ 1983  . Hernia repair    . Tubal ligation  1964  . Cystoscopy w/ stone manipulation  1980's    REVIEW OF SYSTEMS:  Constitutional: positive for fatigue Eyes: negative Ears, nose, mouth, throat, and face: negative Respiratory: negative Cardiovascular: negative Gastrointestinal: negative Genitourinary:negative Integument/breast: negative Hematologic/lymphatic: negative Musculoskeletal:negative Neurological: positive for dizziness Behavioral/Psych: negative Endocrine: negative Allergic/Immunologic: negative   PHYSICAL EXAMINATION: General appearance: alert, cooperative, fatigued and no distress Head: Normocephalic, without obvious abnormality, atraumatic Neck: no adenopathy Lymph nodes: Cervical, supraclavicular, and axillary nodes normal. Resp: clear to auscultation bilaterally Back: symmetric, no curvature. ROM normal. No CVA tenderness. Cardio: regular rate and rhythm, S1, S2 normal, no murmur, click, rub or gallop GI: soft, non-tender; bowel sounds normal; no masses,  no organomegaly Extremities: extremities normal, atraumatic, no cyanosis or edema Neurologic: Alert and oriented X 3, normal strength and tone. Normal symmetric reflexes. Normal coordination and gait  ECOG PERFORMANCE STATUS: 2 - Symptomatic, <50% confined to bed  Blood pressure 121/60, pulse 78, temperature 98.2 F (36.8 C), temperature source Oral, resp. rate 18,  height $Remov'5\' 5"'HeQWNZ$  (1.651 m), weight 210 lb 1.6 oz (95.301 kg), SpO2 100 %.  LABORATORY DATA: Lab Results  Component Value Date   WBC 8.5 05/25/2014   HGB 10.4* 05/25/2014   HCT 32.2* 05/25/2014   MCV 97.5 05/25/2014   PLT 230 05/25/2014      Chemistry      Component Value Date/Time   NA 141 05/25/2014 0834   NA 143 11/10/2013 0748   K 4.3 05/25/2014 0834   K 3.4* 11/10/2013 0748   CL 98 11/10/2013 0748   CL 98 09/17/2012 1242   CO2 29 05/25/2014 0834   CO2 30 11/10/2013 0748   BUN 32.9* 05/25/2014 0834   BUN 80* 11/10/2013 0748   CREATININE 3.8* 05/25/2014 0834   CREATININE 3.66* 11/10/2013 0748      Component Value Date/Time   CALCIUM 8.6 05/25/2014 0834   CALCIUM 9.5 11/10/2013 0748   CALCIUM 8.6 12/02/2012 0933   ALKPHOS 104 05/25/2014 0834   ALKPHOS 81 03/26/2012 1643   AST 17 05/25/2014 0834  AST 23 03/26/2012 1643   ALT 18 05/25/2014 0834   ALT 14 03/26/2012 1643   BILITOT 0.42 05/25/2014 0834   BILITOT 0.2* 03/26/2012 1643     Other lab results: Showed evidence for improvement in her disease especially with the free lambda light chain.  ASSESSMENT: This is a very pleasant 80 years old [place] American female with plasma cell dyscrasia completed treatment with weekly subcutaneous Velcade and Decadron status post 21 weekly doses and tolerating it fairly well. She resumed treatment after showing some evidence of progress and in her disease on 03/30/2014. She is currently status post 8 cycles. She continues to tolerate her treatment without significant complaints. Her recent repeat protein studies show improvement in her disease with a decrease in her lambda free light chain to 13.50 down from 21.30 proximally 2 months ago. The patient was discussed with and also seen by Dr. Julien Nordmann. She will continue on her current treatment with weekly subcutaneous Velcade and dexamethasone. She'll follow-up in 3 weeks for another symptom management visit.  She was advised to call  immediately if she has any concerning symptoms in the interval.  Carlton Adam, PA-C 05/25/2014  ADDENDUM: Hematology/Oncology Attending: I had a face to face encounter with the patient. I recommended her care plan. This is a very pleasant 80 years old African-American female with history of multiple myeloma is here today accompanied by her daughter for evaluation. She is currently undergoing treatment with single agent subcutaneous Velcade and Decadron on a weekly basis. The patient is tolerating her treatment fairly well with no significant adverse effects. She is also currently on hemodialysis for end stage renal disease. Her recent myeloma panel showed improvement in her condition. I discussed the lab result with the patient and her daughter. I recommended for her to continue his 9 more weeks of the same regimen. She was advised to call immediately if she has any concerning symptoms in the interval.  Disclaimer: This note was dictated with voice recognition software. Similar sounding words can inadvertently be transcribed and may not be corrected upon review. Eilleen Kempf., MD 05/29/2014

## 2014-05-25 NOTE — Patient Instructions (Signed)
[facility] Discharge Instructions for Patients Receiving Chemotherapy  Today you received the following chemotherapy agents: Velcade.  To help prevent nausea and vomiting after your treatment, we encourage you to take your nausea medication:  Zofran 8 mg every 8 hours.   If you develop nausea and vomiting that is not controlled by your nausea medication, call the clinic.   BELOW ARE SYMPTOMS THAT SHOULD BE REPORTED IMMEDIATELY:  *FEVER GREATER THAN 100.5 F  *CHILLS WITH OR WITHOUT FEVER  NAUSEA AND VOMITING THAT IS NOT CONTROLLED WITH YOUR NAUSEA MEDICATION  *UNUSUAL SHORTNESS OF BREATH  *UNUSUAL BRUISING OR BLEEDING  TENDERNESS IN MOUTH AND THROAT WITH OR WITHOUT PRESENCE OF ULCERS  *URINARY PROBLEMS  *BOWEL PROBLEMS  UNUSUAL RASH Items with * indicate a potential emergency and should be followed up as soon as possible.  Feel free to call the clinic you have any questions or concerns. The clinic phone number is [phone].

## 2014-05-25 NOTE — Telephone Encounter (Signed)
Gave avs & cal for Jan. Sent mess to adjust tx to follow md appt.

## 2014-05-25 NOTE — Telephone Encounter (Signed)
Cr 3.8, no further orders at this time per Dr Vista Mink

## 2014-05-25 NOTE — Patient Instructions (Signed)
Your recent repeat protein studies showed improvement in your disease Continue labs and chemotherapy as scheduled Follow-up in 3 weeks

## 2014-05-25 NOTE — Telephone Encounter (Signed)
I have adjusted 1/12 appt

## 2014-06-01 ENCOUNTER — Other Ambulatory Visit (HOSPITAL_BASED_OUTPATIENT_CLINIC_OR_DEPARTMENT_OTHER): Payer: Medicare Other

## 2014-06-01 ENCOUNTER — Other Ambulatory Visit: Payer: Self-pay | Admitting: Internal Medicine

## 2014-06-01 ENCOUNTER — Telehealth: Payer: Self-pay | Admitting: *Deleted

## 2014-06-01 ENCOUNTER — Ambulatory Visit (HOSPITAL_BASED_OUTPATIENT_CLINIC_OR_DEPARTMENT_OTHER): Payer: Medicare Other

## 2014-06-01 DIAGNOSIS — C9002 Multiple myeloma in relapse: Secondary | ICD-10-CM

## 2014-06-01 DIAGNOSIS — Z5112 Encounter for antineoplastic immunotherapy: Secondary | ICD-10-CM

## 2014-06-01 DIAGNOSIS — C9000 Multiple myeloma not having achieved remission: Secondary | ICD-10-CM

## 2014-06-01 LAB — COMPREHENSIVE METABOLIC PANEL (CC13)
ALBUMIN: 3.3 g/dL — AB (ref 3.5–5.0)
ALK PHOS: 101 U/L (ref 40–150)
ALT: 14 U/L (ref 0–55)
AST: 14 U/L (ref 5–34)
Anion Gap: 11 mEq/L (ref 3–11)
BUN: 30.3 mg/dL — AB (ref 7.0–26.0)
CO2: 30 mEq/L — ABNORMAL HIGH (ref 22–29)
CREATININE: 3.8 mg/dL — AB (ref 0.6–1.1)
Calcium: 8.7 mg/dL (ref 8.4–10.4)
Chloride: 100 mEq/L (ref 98–109)
EGFR: 12 mL/min/{1.73_m2} — ABNORMAL LOW (ref >90)
Glucose: 142 mg/dl — ABNORMAL HIGH (ref 70–140)
POTASSIUM: 4.1 meq/L (ref 3.5–5.1)
Sodium: 142 mEq/L (ref 136–145)
Total Bilirubin: 0.37 mg/dL (ref 0.20–1.20)
Total Protein: 6.9 g/dL (ref 6.4–8.3)

## 2014-06-01 LAB — CBC WITH DIFFERENTIAL/PLATELET
BASO%: 0.3 % (ref 0.0–2.0)
Basophils Absolute: 0 10*3/uL (ref 0.0–0.1)
EOS%: 1.9 % (ref 0.0–7.0)
Eosinophils Absolute: 0.1 10*3/uL (ref 0.0–0.5)
HCT: 33.1 % — ABNORMAL LOW (ref 34.8–46.6)
HGB: 10.7 g/dL — ABNORMAL LOW (ref 11.6–15.9)
LYMPH%: 16.3 % (ref 14.0–49.7)
MCH: 31.7 pg (ref 25.1–34.0)
MCHC: 32.3 g/dL (ref 31.5–36.0)
MCV: 97.9 fL (ref 79.5–101.0)
MONO#: 0.5 10*3/uL (ref 0.1–0.9)
MONO%: 6.5 % (ref 0.0–14.0)
NEUT#: 5.2 10*3/uL (ref 1.5–6.5)
NEUT%: 75 % (ref 38.4–76.8)
PLATELETS: 242 10*3/uL (ref 145–400)
RBC: 3.38 10*6/uL — ABNORMAL LOW (ref 3.70–5.45)
RDW: 16.7 % — ABNORMAL HIGH (ref 11.2–14.5)
WBC: 6.9 10*3/uL (ref 3.9–10.3)
lymph#: 1.1 10*3/uL (ref 0.9–3.3)

## 2014-06-01 MED ORDER — ONDANSETRON HCL 8 MG PO TABS
ORAL_TABLET | ORAL | Status: AC
  Filled 2014-06-01: qty 1

## 2014-06-01 MED ORDER — ONDANSETRON HCL 8 MG PO TABS
8.0000 mg | ORAL_TABLET | Freq: Once | ORAL | Status: AC
  Administered 2014-06-01: 8 mg via ORAL

## 2014-06-01 MED ORDER — BORTEZOMIB CHEMO SQ INJECTION 3.5 MG (2.5MG/ML)
1.3000 mg/m2 | Freq: Once | INTRAMUSCULAR | Status: AC
  Administered 2014-06-01: 2.75 mg via SUBCUTANEOUS
  Filled 2014-06-01: qty 2.75

## 2014-06-01 NOTE — Telephone Encounter (Signed)
Critical cr 3.8, no further orders at this time per Dr Vista Mink.

## 2014-06-01 NOTE — Progress Notes (Signed)
Per Dr. Julien Rice, okay to tx with creatinine 3.8

## 2014-06-01 NOTE — Patient Instructions (Signed)
[facility] Discharge Instructions for Patients Receiving Chemotherapy  Today you received the following chemotherapy agents: Velcade  To help prevent nausea and vomiting after your treatment, we encourage you to take your nausea medication as prescribed by your physician.   If you develop nausea and vomiting that is not controlled by your nausea medication, call the clinic.   BELOW ARE SYMPTOMS THAT SHOULD BE REPORTED IMMEDIATELY:  *FEVER GREATER THAN 100.5 F  *CHILLS WITH OR WITHOUT FEVER  NAUSEA AND VOMITING THAT IS NOT CONTROLLED WITH YOUR NAUSEA MEDICATION  *UNUSUAL SHORTNESS OF BREATH  *UNUSUAL BRUISING OR BLEEDING  TENDERNESS IN MOUTH AND THROAT WITH OR WITHOUT PRESENCE OF ULCERS  *URINARY PROBLEMS  *BOWEL PROBLEMS  UNUSUAL RASH Items with * indicate a potential emergency and should be followed up as soon as possible.  Feel free to call the clinic you have any questions or concerns. The clinic phone number is [phone].

## 2014-06-04 HISTORY — PX: EYE SURGERY: SHX253

## 2014-06-08 ENCOUNTER — Other Ambulatory Visit (HOSPITAL_BASED_OUTPATIENT_CLINIC_OR_DEPARTMENT_OTHER): Payer: 59

## 2014-06-08 ENCOUNTER — Ambulatory Visit (HOSPITAL_BASED_OUTPATIENT_CLINIC_OR_DEPARTMENT_OTHER): Payer: 59

## 2014-06-08 DIAGNOSIS — C9000 Multiple myeloma not having achieved remission: Secondary | ICD-10-CM

## 2014-06-08 DIAGNOSIS — Z5112 Encounter for antineoplastic immunotherapy: Secondary | ICD-10-CM

## 2014-06-08 DIAGNOSIS — C9002 Multiple myeloma in relapse: Secondary | ICD-10-CM

## 2014-06-08 LAB — COMPREHENSIVE METABOLIC PANEL (CC13)
ALBUMIN: 3.4 g/dL — AB (ref 3.5–5.0)
ALT: 13 U/L (ref 0–55)
AST: 15 U/L (ref 5–34)
Alkaline Phosphatase: 112 U/L (ref 40–150)
Anion Gap: 12 mEq/L — ABNORMAL HIGH (ref 3–11)
BUN: 31.1 mg/dL — AB (ref 7.0–26.0)
CO2: 30 mEq/L — ABNORMAL HIGH (ref 22–29)
CREATININE: 4.1 mg/dL — AB (ref 0.6–1.1)
Calcium: 8.6 mg/dL (ref 8.4–10.4)
Chloride: 99 mEq/L (ref 98–109)
EGFR: 11 mL/min/{1.73_m2} — ABNORMAL LOW (ref >90)
Glucose: 160 mg/dl — ABNORMAL HIGH (ref 70–140)
POTASSIUM: 4 meq/L (ref 3.5–5.1)
Sodium: 141 mEq/L (ref 136–145)
TOTAL PROTEIN: 7.2 g/dL (ref 6.4–8.3)
Total Bilirubin: 0.36 mg/dL (ref 0.20–1.20)

## 2014-06-08 LAB — CBC WITH DIFFERENTIAL/PLATELET
BASO%: 0.3 % (ref 0.0–2.0)
Basophils Absolute: 0 10*3/uL (ref 0.0–0.1)
EOS%: 1.5 % (ref 0.0–7.0)
Eosinophils Absolute: 0.1 10*3/uL (ref 0.0–0.5)
HCT: 34.8 % (ref 34.8–46.6)
HGB: 11.3 g/dL — ABNORMAL LOW (ref 11.6–15.9)
LYMPH#: 1.1 10*3/uL (ref 0.9–3.3)
LYMPH%: 14.9 % (ref 14.0–49.7)
MCH: 31.7 pg (ref 25.1–34.0)
MCHC: 32.5 g/dL (ref 31.5–36.0)
MCV: 97.5 fL (ref 79.5–101.0)
MONO#: 0.5 10*3/uL (ref 0.1–0.9)
MONO%: 6.8 % (ref 0.0–14.0)
NEUT#: 5.7 10*3/uL (ref 1.5–6.5)
NEUT%: 76.5 % (ref 38.4–76.8)
Platelets: 202 10*3/uL (ref 145–400)
RBC: 3.57 10*6/uL — AB (ref 3.70–5.45)
RDW: 16.3 % — AB (ref 11.2–14.5)
WBC: 7.5 10*3/uL (ref 3.9–10.3)

## 2014-06-08 MED ORDER — BORTEZOMIB CHEMO SQ INJECTION 3.5 MG (2.5MG/ML)
1.3000 mg/m2 | Freq: Once | INTRAMUSCULAR | Status: AC
  Administered 2014-06-08: 2.75 mg via SUBCUTANEOUS
  Filled 2014-06-08: qty 2.75

## 2014-06-08 MED ORDER — ONDANSETRON HCL 8 MG PO TABS
ORAL_TABLET | ORAL | Status: AC
  Filled 2014-06-08: qty 1

## 2014-06-08 MED ORDER — ONDANSETRON HCL 8 MG PO TABS
8.0000 mg | ORAL_TABLET | Freq: Once | ORAL | Status: AC
  Administered 2014-06-08: 8 mg via ORAL

## 2014-06-08 NOTE — Patient Instructions (Signed)
[facility] for [facility] Receiving Chemotherapy  Today you received the following chemotherapy agents: Velcade. To help prevent nausea and vomiting after your treatment, we encourage you to take your nausea medication.  If you develop nausea and vomiting that is not controlled by your nausea medication, call the clinic.   BELOW ARE SYMPTOMS THAT SHOULD BE REPORTED IMMEDIATELY:  *FEVER GREATER THAN 100.5 F  *CHILLS WITH OR WITHOUT FEVER  NAUSEA AND VOMITING THAT IS NOT CONTROLLED WITH YOUR NAUSEA MEDICATION  *UNUSUAL SHORTNESS OF BREATH  *UNUSUAL BRUISING OR BLEEDING  TENDERNESS IN MOUTH AND THROAT WITH OR WITHOUT PRESENCE OF ULCERS  *URINARY PROBLEMS  *BOWEL PROBLEMS  UNUSUAL RASH Items with * indicate a potential emergency and should be followed up as soon as possible.  Feel free to call the clinic you have any questions or concerns. The clinic phone number is [phone].    

## 2014-06-15 ENCOUNTER — Encounter: Payer: Self-pay | Admitting: Internal Medicine

## 2014-06-15 ENCOUNTER — Other Ambulatory Visit (HOSPITAL_BASED_OUTPATIENT_CLINIC_OR_DEPARTMENT_OTHER): Payer: 59

## 2014-06-15 ENCOUNTER — Telehealth: Payer: Self-pay | Admitting: Internal Medicine

## 2014-06-15 ENCOUNTER — Other Ambulatory Visit: Payer: Medicare Other

## 2014-06-15 ENCOUNTER — Ambulatory Visit (HOSPITAL_BASED_OUTPATIENT_CLINIC_OR_DEPARTMENT_OTHER): Payer: 59

## 2014-06-15 ENCOUNTER — Ambulatory Visit (HOSPITAL_BASED_OUTPATIENT_CLINIC_OR_DEPARTMENT_OTHER): Payer: 59 | Admitting: Internal Medicine

## 2014-06-15 VITALS — BP 125/51 | HR 84 | Temp 98.1°F | Resp 18 | Ht 65.0 in | Wt 205.2 lb

## 2014-06-15 DIAGNOSIS — C9000 Multiple myeloma not having achieved remission: Secondary | ICD-10-CM

## 2014-06-15 DIAGNOSIS — Z5112 Encounter for antineoplastic immunotherapy: Secondary | ICD-10-CM

## 2014-06-15 DIAGNOSIS — C9002 Multiple myeloma in relapse: Secondary | ICD-10-CM

## 2014-06-15 LAB — COMPREHENSIVE METABOLIC PANEL (CC13)
ALK PHOS: 106 U/L (ref 40–150)
ALT: 14 U/L (ref 0–55)
AST: 16 U/L (ref 5–34)
Albumin: 3.5 g/dL (ref 3.5–5.0)
Anion Gap: 15 mEq/L — ABNORMAL HIGH (ref 3–11)
BILIRUBIN TOTAL: 0.37 mg/dL (ref 0.20–1.20)
BUN: 37.3 mg/dL — ABNORMAL HIGH (ref 7.0–26.0)
CO2: 28 mEq/L (ref 22–29)
CREATININE: 4.6 mg/dL — AB (ref 0.6–1.1)
Calcium: 8.4 mg/dL (ref 8.4–10.4)
Chloride: 101 mEq/L (ref 98–109)
EGFR: 10 mL/min/{1.73_m2} — ABNORMAL LOW (ref >90)
Glucose: 187 mg/dl — ABNORMAL HIGH (ref 70–140)
Potassium: 4 mEq/L (ref 3.5–5.1)
SODIUM: 144 meq/L (ref 136–145)
Total Protein: 7.4 g/dL (ref 6.4–8.3)

## 2014-06-15 LAB — CBC WITH DIFFERENTIAL/PLATELET
BASO%: 0.3 % (ref 0.0–2.0)
Basophils Absolute: 0 10*3/uL (ref 0.0–0.1)
EOS ABS: 0.1 10*3/uL (ref 0.0–0.5)
EOS%: 1 % (ref 0.0–7.0)
HEMATOCRIT: 36.8 % (ref 34.8–46.6)
HGB: 11.8 g/dL (ref 11.6–15.9)
LYMPH#: 1.1 10*3/uL (ref 0.9–3.3)
LYMPH%: 14.6 % (ref 14.0–49.7)
MCH: 31.2 pg (ref 25.1–34.0)
MCHC: 32.1 g/dL (ref 31.5–36.0)
MCV: 97.4 fL (ref 79.5–101.0)
MONO#: 0.5 10*3/uL (ref 0.1–0.9)
MONO%: 7 % (ref 0.0–14.0)
NEUT%: 77.1 % — ABNORMAL HIGH (ref 38.4–76.8)
NEUTROS ABS: 5.9 10*3/uL (ref 1.5–6.5)
PLATELETS: 193 10*3/uL (ref 145–400)
RBC: 3.78 10*6/uL (ref 3.70–5.45)
RDW: 16.3 % — ABNORMAL HIGH (ref 11.2–14.5)
WBC: 7.7 10*3/uL (ref 3.9–10.3)

## 2014-06-15 MED ORDER — DEXAMETHASONE 4 MG PO TABS: 40.0000 mg | ORAL_TABLET | ORAL | Status: DC

## 2014-06-15 MED ORDER — BORTEZOMIB CHEMO SQ INJECTION 3.5 MG (2.5MG/ML)
1.3000 mg/m2 | Freq: Once | INTRAMUSCULAR | Status: AC
  Administered 2014-06-15: 2.75 mg via SUBCUTANEOUS
  Filled 2014-06-15: qty 2.75

## 2014-06-15 MED ORDER — ONDANSETRON HCL 8 MG PO TABS
ORAL_TABLET | ORAL | Status: AC
  Filled 2014-06-15: qty 1

## 2014-06-15 MED ORDER — ONDANSETRON HCL 8 MG PO TABS
8.0000 mg | ORAL_TABLET | Freq: Once | ORAL | Status: AC
  Administered 2014-06-15: 8 mg via ORAL

## 2014-06-15 NOTE — Progress Notes (Signed)
[facility] [phone]   [phone]  OFFICE PROGRESS NOTE  [name], MD [place] [address]  DIAGNOSIS:  #1 plasma cell dyscrasia, multiple myeloma.  #2 anemia of chronic disease secondary to chronic kidney disease.   PRIOR THERAPY: Treatment with single agent subcutaneous Velcade 1.3 mg/m2 on a weekly basis with Decadron 40 mg by mouth weekly, status post 27 cycles. First cycle to be given 07/15/2013   CURRENT THERAPY: Observation.  INTERVAL HISTORY: Francies Inch Mccambridge 81 y.o. female returns to the clinic today for followup visit accompanied by her daughter. She is tolerating her treatment with subcutaneous Velcade and Decadron fairly well with no significant complaints. The patient is currently on hemodialysis on Monday, Wednesday and Friday. She denied having any fever or chills, no nausea or vomiting. The patient denied having any significant chest pain, shortness of breath, cough or hemoptysis. She has no significant weight loss or night sweats.  MEDICAL HISTORY: Past Medical History  Diagnosis Date  . Hypertension   . Asthma   . GERD (gastroesophageal reflux disease)   . Neuropathy in diabetes     Hx: of  . COPD (chronic obstructive pulmonary disease)   . Anemia   . High cholesterol   . Pulmonary embolism 1987  . Type II diabetes mellitus   . History of blood transfusion 1987; 2011    "related to lung OR; HgB went down to 5"  . Arthritis     "hips, knees, shoulders" (11/10/2013)  . Chronic lower back pain   . Gout   . ESRD (end stage renal disease) on dialysis     "1st treatment 11/09/2013"  . Kidney stones   . Multiple myeloma     ALLERGIES:  is allergic to penicillins cross reactors.  MEDICATIONS:  Current Outpatient Prescriptions  Medication Sig Dispense Refill  . acyclovir (ZOVIRAX) 400 MG tablet Take 1 tablet (400 mg total) by mouth daily. 30 tablet 0  . albuterol (PROVENTIL  HFA;VENTOLIN HFA) 108 (90 BASE) MCG/ACT inhaler Inhale 1 puff into the lungs 2 (two) times daily as needed for wheezing or shortness of breath.    . allopurinol (ZYLOPRIM) 100 MG tablet Take 100 mg by mouth daily with breakfast.     . amLODipine (NORVASC) 5 MG tablet Take 5 mg by mouth daily with breakfast.     . cyclobenzaprine (FLEXERIL) 5 MG tablet   0  . dexamethasone (DECADRON) 4 MG tablet Take 10 tablets (40 mg total) by mouth once a week. 40 tablet 1  . gabapentin (NEURONTIN) 100 MG capsule Take 100 mg by mouth 3 (three) times daily.    Marland Kitchen glimepiride (AMARYL) 1 MG tablet Take 1 mg by mouth daily with breakfast.     . HYDROcodone-acetaminophen (NORCO) 7.5-325 MG per tablet Take 1 tablet by mouth every 6 (six) hours as needed for moderate pain.    Marland Kitchen lidocaine-prilocaine (EMLA) cream     . Multiple Vitamins-Minerals (MULTIVITAMIN WITH MINERALS) tablet Take 2 tablets by mouth daily.    . multivitamin (RENA-VIT) TABS tablet Take 1 tablet by mouth daily.    . ONE TOUCH ULTRA TEST test strip     . Oxycodone HCl 20 MG TABS Take 20 mg by mouth every 12 (twelve) hours.     . pantoprazole (PROTONIX) 40 MG tablet Take 40 mg by mouth 2 (two) times daily.     . saxagliptin HCl (ONGLYZA) 2.5 MG TABS tablet Take 2.5  mg by mouth daily with breakfast.     . ondansetron (ZOFRAN) 8 MG tablet Take 1 tablet (8 mg total) by mouth every 8 (eight) hours as needed for nausea or vomiting. (Patient not taking: Reported on 06/15/2014) 20 tablet 1   No current facility-administered medications for this visit.    SURGICAL HISTORY:  Past Surgical History  Procedure Laterality Date  . Lung removal, partial Left 1987  . Cholecystectomy    . Av fistula placement Left 2011    "lower arm; never used"  . Colonoscopy    . Abdominoplasty  ~ 1983    "tummy tuck"  . Abdominal hysterectomy  1964  . Av fistula placement Left 05/15/2013    Procedure: INSERTION OF ARTERIOVENOUS (AV) GORE-TEX GRAFT ARM-LEFT UPPER ARM;   Surgeon: Mal Misty, MD;  Location: [facility];  Service: Vascular;  Laterality: Left;  . Appendectomy  1954  . Umbilical hernia repair  ~ 1983  . Hernia repair    . Tubal ligation  1964  . Cystoscopy w/ stone manipulation  1980's    REVIEW OF SYSTEMS:  Constitutional: positive for fatigue Eyes: negative Ears, nose, mouth, throat, and face: negative Respiratory: negative Cardiovascular: negative Gastrointestinal: negative Genitourinary:negative Integument/breast: negative Hematologic/lymphatic: negative Musculoskeletal:negative Neurological: negative Behavioral/Psych: negative Endocrine: negative Allergic/Immunologic: negative   PHYSICAL EXAMINATION: General appearance: alert, cooperative, fatigued and no distress Head: Normocephalic, without obvious abnormality, atraumatic Neck: no adenopathy Lymph nodes: Cervical, supraclavicular, and axillary nodes normal. Resp: clear to auscultation bilaterally Back: symmetric, no curvature. ROM normal. No CVA tenderness. Cardio: regular rate and rhythm, S1, S2 normal, no murmur, click, rub or gallop GI: soft, non-tender; bowel sounds normal; no masses,  no organomegaly Extremities: extremities normal, atraumatic, no cyanosis or edema Neurologic: Alert and oriented X 3, normal strength and tone. Normal symmetric reflexes. Normal coordination and gait  ECOG PERFORMANCE STATUS: 2 - Symptomatic, <50% confined to bed  Blood pressure 125/51, pulse 84, temperature 98.1 F (36.7 C), temperature source Oral, resp. rate 18, height _0  (1.651 m), weight 205 lb 3.2 oz (93.078 kg), SpO2 99 %.  LABORATORY DATA: Lab Results  Component Value Date   WBC 7.7 06/15/2014   HGB 11.8 06/15/2014   HCT 36.8 06/15/2014   MCV 97.4 06/15/2014   PLT 193 06/15/2014      Chemistry      Component Value Date/Time   NA 141 06/08/2014 0833   NA 143 11/10/2013 0748   K 4.0 06/08/2014 0833   K 3.4* 11/10/2013 0748   CL 98 11/10/2013 0748   CL 98 09/17/2012  1242   CO2 30* 06/08/2014 0833   CO2 30 11/10/2013 0748   BUN 31.1* 06/08/2014 0833   BUN 80* 11/10/2013 0748   CREATININE 4.1* 06/08/2014 0833   CREATININE 3.66* 11/10/2013 0748      Component Value Date/Time   CALCIUM 8.6 06/08/2014 0833   CALCIUM 9.5 11/10/2013 0748   CALCIUM 8.6 12/02/2012 0933   ALKPHOS 112 06/08/2014 0833   ALKPHOS 81 03/26/2012 1643   AST 15 06/08/2014 0833   AST 23 03/26/2012 1643   ALT 13 06/08/2014 0833   ALT 14 03/26/2012 1643   BILITOT 0.36 06/08/2014 0833   BILITOT 0.2* 03/26/2012 1643     Other lab results: Showed evidence for disease progression especially with the free lambda light chain.  ASSESSMENT: This is a very pleasant 81 years old [place] American female with plasma cell dyscrasia completed treatment with weekly subcutaneous Velcade and Decadron status post  27 weekly doses and tolerating it fairly well  She is tolerating her treatment with no significant complaints. I recommended for the patient to continue her current treatment with Velcade and Decadron as a scheduled. She will come back for follow-up visit in 3 weeks for reevaluation. She was advised to call immediately if she has any concerning symptoms in the interval.  Disclaimer: This note was dictated with voice recognition software. Similar sounding words can inadvertently be transcribed and may not be corrected upon review.

## 2014-06-15 NOTE — Telephone Encounter (Signed)
Pt confirmed labs/ov per 01/12 POF, gave pt AVS.... KJ, sent msg to add chemo °

## 2014-06-15 NOTE — Patient Instructions (Signed)
[name] Discharge Instructions for Patients Receiving Chemotherapy  Today you received the following chemotherapy agents Velban  To help prevent nausea and vomiting after your treatment, we encourage you to take your nausea medication as directed   If you develop nausea and vomiting that is not controlled by your nausea medication, call the clinic.   BELOW ARE SYMPTOMS THAT SHOULD BE REPORTED IMMEDIATELY:  *FEVER GREATER THAN 100.5 F  *CHILLS WITH OR WITHOUT FEVER  NAUSEA AND VOMITING THAT IS NOT CONTROLLED WITH YOUR NAUSEA MEDICATION  *UNUSUAL SHORTNESS OF BREATH  *UNUSUAL BRUISING OR BLEEDING  TENDERNESS IN MOUTH AND THROAT WITH OR WITHOUT PRESENCE OF ULCERS  *URINARY PROBLEMS  *BOWEL PROBLEMS  UNUSUAL RASH Items with * indicate a potential emergency and should be followed up as soon as possible.  Feel free to call the clinic you have any questions or concerns. The clinic phone number is [phone].

## 2014-06-15 NOTE — Progress Notes (Signed)
Ok to treat today with lab work 06/15/14

## 2014-06-16 ENCOUNTER — Other Ambulatory Visit: Payer: Self-pay | Admitting: Internal Medicine

## 2014-06-16 DIAGNOSIS — C9000 Multiple myeloma not having achieved remission: Secondary | ICD-10-CM

## 2014-06-22 ENCOUNTER — Other Ambulatory Visit (HOSPITAL_BASED_OUTPATIENT_CLINIC_OR_DEPARTMENT_OTHER): Payer: Medicare Other

## 2014-06-22 ENCOUNTER — Ambulatory Visit (HOSPITAL_BASED_OUTPATIENT_CLINIC_OR_DEPARTMENT_OTHER): Payer: Medicare Other

## 2014-06-22 DIAGNOSIS — C9002 Multiple myeloma in relapse: Secondary | ICD-10-CM

## 2014-06-22 DIAGNOSIS — C9000 Multiple myeloma not having achieved remission: Secondary | ICD-10-CM

## 2014-06-22 DIAGNOSIS — Z5112 Encounter for antineoplastic immunotherapy: Secondary | ICD-10-CM

## 2014-06-22 LAB — COMPREHENSIVE METABOLIC PANEL (CC13)
ALBUMIN: 3.4 g/dL — AB (ref 3.5–5.0)
ALT: 13 U/L (ref 0–55)
AST: 13 U/L (ref 5–34)
Alkaline Phosphatase: 105 U/L (ref 40–150)
Anion Gap: 12 mEq/L — ABNORMAL HIGH (ref 3–11)
BILIRUBIN TOTAL: 0.29 mg/dL (ref 0.20–1.20)
BUN: 43.9 mg/dL — AB (ref 7.0–26.0)
CALCIUM: 8.1 mg/dL — AB (ref 8.4–10.4)
CO2: 29 meq/L (ref 22–29)
CREATININE: 4.7 mg/dL — AB (ref 0.6–1.1)
Chloride: 102 mEq/L (ref 98–109)
EGFR: 9 mL/min/{1.73_m2} — ABNORMAL LOW (ref >90)
Glucose: 117 mg/dl (ref 70–140)
Potassium: 3.6 mEq/L (ref 3.5–5.1)
Sodium: 142 mEq/L (ref 136–145)
Total Protein: 7.1 g/dL (ref 6.4–8.3)

## 2014-06-22 LAB — CBC WITH DIFFERENTIAL/PLATELET
BASO%: 0.3 % (ref 0.0–2.0)
BASOS ABS: 0 10*3/uL (ref 0.0–0.1)
EOS%: 1.3 % (ref 0.0–7.0)
Eosinophils Absolute: 0.1 10*3/uL (ref 0.0–0.5)
HEMATOCRIT: 35.3 % (ref 34.8–46.6)
HGB: 11.6 g/dL (ref 11.6–15.9)
LYMPH#: 1.2 10*3/uL (ref 0.9–3.3)
LYMPH%: 15.1 % (ref 14.0–49.7)
MCH: 31.7 pg (ref 25.1–34.0)
MCHC: 32.9 g/dL (ref 31.5–36.0)
MCV: 96.4 fL (ref 79.5–101.0)
MONO#: 0.5 10*3/uL (ref 0.1–0.9)
MONO%: 5.9 % (ref 0.0–14.0)
NEUT%: 77.4 % — ABNORMAL HIGH (ref 38.4–76.8)
NEUTROS ABS: 6 10*3/uL (ref 1.5–6.5)
Platelets: 202 10*3/uL (ref 145–400)
RBC: 3.66 10*6/uL — AB (ref 3.70–5.45)
RDW: 16.2 % — ABNORMAL HIGH (ref 11.2–14.5)
WBC: 7.8 10*3/uL (ref 3.9–10.3)

## 2014-06-22 MED ORDER — ONDANSETRON HCL 8 MG PO TABS
8.0000 mg | ORAL_TABLET | Freq: Once | ORAL | Status: AC
  Administered 2014-06-22: 8 mg via ORAL

## 2014-06-22 MED ORDER — ONDANSETRON HCL 8 MG PO TABS
ORAL_TABLET | ORAL | Status: AC
  Filled 2014-06-22: qty 1

## 2014-06-22 MED ORDER — BORTEZOMIB CHEMO SQ INJECTION 3.5 MG (2.5MG/ML)
1.3000 mg/m2 | Freq: Once | INTRAMUSCULAR | Status: AC
  Administered 2014-06-22: 2.75 mg via SUBCUTANEOUS
  Filled 2014-06-22: qty 2.75

## 2014-06-22 NOTE — Progress Notes (Signed)
PT had dialysis yestserday . Okay to treat patient with Velcade and today's CMP results.

## 2014-06-22 NOTE — Patient Instructions (Signed)
[facility] Discharge Instructions for Patients Receiving Chemotherapy  Today you received the following chemotherapy agents: Velcade. To help prevent nausea and vomiting after your treatment, we encourage you to take your nausea medication.  If you develop nausea and vomiting that is not controlled by your nausea medication, call the clinic.   BELOW ARE SYMPTOMS THAT SHOULD BE REPORTED IMMEDIATELY:  *FEVER GREATER THAN 100.5 F  *CHILLS WITH OR WITHOUT FEVER  NAUSEA AND VOMITING THAT IS NOT CONTROLLED WITH YOUR NAUSEA MEDICATION  *UNUSUAL SHORTNESS OF BREATH  *UNUSUAL BRUISING OR BLEEDING  TENDERNESS IN MOUTH AND THROAT WITH OR WITHOUT PRESENCE OF ULCERS  *URINARY PROBLEMS  *BOWEL PROBLEMS  UNUSUAL RASH Items with * indicate a potential emergency and should be followed up as soon as possible.  Feel free to call the clinic you have any questions or concerns. The clinic phone number is [phone].    

## 2014-06-29 ENCOUNTER — Other Ambulatory Visit (HOSPITAL_BASED_OUTPATIENT_CLINIC_OR_DEPARTMENT_OTHER): Payer: Medicare Other

## 2014-06-29 ENCOUNTER — Ambulatory Visit (HOSPITAL_BASED_OUTPATIENT_CLINIC_OR_DEPARTMENT_OTHER): Payer: Medicare Other

## 2014-06-29 DIAGNOSIS — C9000 Multiple myeloma not having achieved remission: Secondary | ICD-10-CM

## 2014-06-29 DIAGNOSIS — C9002 Multiple myeloma in relapse: Secondary | ICD-10-CM

## 2014-06-29 DIAGNOSIS — Z5112 Encounter for antineoplastic immunotherapy: Secondary | ICD-10-CM

## 2014-06-29 LAB — CBC WITH DIFFERENTIAL/PLATELET
BASO%: 0.2 % (ref 0.0–2.0)
Basophils Absolute: 0 10*3/uL (ref 0.0–0.1)
EOS%: 1.6 % (ref 0.0–7.0)
Eosinophils Absolute: 0.1 10*3/uL (ref 0.0–0.5)
HEMATOCRIT: 36.2 % (ref 34.8–46.6)
HGB: 11.9 g/dL (ref 11.6–15.9)
LYMPH%: 20.8 % (ref 14.0–49.7)
MCH: 31.4 pg (ref 25.1–34.0)
MCHC: 32.9 g/dL (ref 31.5–36.0)
MCV: 95.5 fL (ref 79.5–101.0)
MONO#: 0.6 10*3/uL (ref 0.1–0.9)
MONO%: 9.2 % (ref 0.0–14.0)
NEUT#: 4.2 10*3/uL (ref 1.5–6.5)
NEUT%: 68.2 % (ref 38.4–76.8)
PLATELETS: 212 10*3/uL (ref 145–400)
RBC: 3.79 10*6/uL (ref 3.70–5.45)
RDW: 16.5 % — AB (ref 11.2–14.5)
WBC: 6.1 10*3/uL (ref 3.9–10.3)
lymph#: 1.3 10*3/uL (ref 0.9–3.3)

## 2014-06-29 LAB — COMPREHENSIVE METABOLIC PANEL (CC13)
ALT: 17 U/L (ref 0–55)
ANION GAP: 13 meq/L — AB (ref 3–11)
AST: 16 U/L (ref 5–34)
Albumin: 3.4 g/dL — ABNORMAL LOW (ref 3.5–5.0)
Alkaline Phosphatase: 95 U/L (ref 40–150)
BUN: 41.8 mg/dL — ABNORMAL HIGH (ref 7.0–26.0)
CO2: 26 meq/L (ref 22–29)
CREATININE: 4.7 mg/dL — AB (ref 0.6–1.1)
Calcium: 8.5 mg/dL (ref 8.4–10.4)
Chloride: 103 mEq/L (ref 98–109)
EGFR: 10 mL/min/{1.73_m2} — AB (ref >90)
Glucose: 116 mg/dl (ref 70–140)
POTASSIUM: 4.5 meq/L (ref 3.5–5.1)
SODIUM: 141 meq/L (ref 136–145)
TOTAL PROTEIN: 7 g/dL (ref 6.4–8.3)
Total Bilirubin: 0.4 mg/dL (ref 0.20–1.20)

## 2014-06-29 MED ORDER — ONDANSETRON HCL 8 MG PO TABS
ORAL_TABLET | ORAL | Status: AC
  Filled 2014-06-29: qty 1

## 2014-06-29 MED ORDER — BORTEZOMIB CHEMO SQ INJECTION 3.5 MG (2.5MG/ML)
1.3000 mg/m2 | Freq: Once | INTRAMUSCULAR | Status: AC
  Administered 2014-06-29: 2.75 mg via SUBCUTANEOUS
  Filled 2014-06-29: qty 2.75

## 2014-06-29 MED ORDER — ONDANSETRON HCL 8 MG PO TABS
8.0000 mg | ORAL_TABLET | Freq: Once | ORAL | Status: AC
  Administered 2014-06-29: 8 mg via ORAL

## 2014-06-29 NOTE — Progress Notes (Signed)
Per Dr. Julien Nordmann okay to treat today with creatinine of 4.7.

## 2014-06-29 NOTE — Patient Instructions (Signed)
[facility] Discharge Instructions for Patients Receiving Chemotherapy  Today you received the following chemotherapy agents: Velcade.  To help prevent nausea and vomiting after your treatment, we encourage you to take your nausea medication as prescribed.   If you develop nausea and vomiting that is not controlled by your nausea medication, call the clinic.   BELOW ARE SYMPTOMS THAT SHOULD BE REPORTED IMMEDIATELY:  *FEVER GREATER THAN 100.5 F  *CHILLS WITH OR WITHOUT FEVER  NAUSEA AND VOMITING THAT IS NOT CONTROLLED WITH YOUR NAUSEA MEDICATION  *UNUSUAL SHORTNESS OF BREATH  *UNUSUAL BRUISING OR BLEEDING  TENDERNESS IN MOUTH AND THROAT WITH OR WITHOUT PRESENCE OF ULCERS  *URINARY PROBLEMS  *BOWEL PROBLEMS  UNUSUAL RASH Items with * indicate a potential emergency and should be followed up as soon as possible.  Feel free to call the clinic you have any questions or concerns. The clinic phone number is [phone].    

## 2014-07-06 ENCOUNTER — Ambulatory Visit (HOSPITAL_BASED_OUTPATIENT_CLINIC_OR_DEPARTMENT_OTHER): Payer: Medicare Other

## 2014-07-06 ENCOUNTER — Ambulatory Visit (HOSPITAL_BASED_OUTPATIENT_CLINIC_OR_DEPARTMENT_OTHER): Payer: Medicare Other | Admitting: Physician Assistant

## 2014-07-06 ENCOUNTER — Encounter: Payer: Self-pay | Admitting: Physician Assistant

## 2014-07-06 ENCOUNTER — Other Ambulatory Visit (HOSPITAL_BASED_OUTPATIENT_CLINIC_OR_DEPARTMENT_OTHER): Payer: Medicare Other

## 2014-07-06 ENCOUNTER — Telehealth: Payer: Self-pay | Admitting: Internal Medicine

## 2014-07-06 VITALS — BP 114/60 | HR 72 | Temp 98.6°F | Resp 18 | Ht 65.0 in | Wt 209.5 lb

## 2014-07-06 DIAGNOSIS — C9000 Multiple myeloma not having achieved remission: Secondary | ICD-10-CM

## 2014-07-06 DIAGNOSIS — D631 Anemia in chronic kidney disease: Secondary | ICD-10-CM

## 2014-07-06 DIAGNOSIS — C9002 Multiple myeloma in relapse: Secondary | ICD-10-CM

## 2014-07-06 DIAGNOSIS — Z992 Dependence on renal dialysis: Secondary | ICD-10-CM

## 2014-07-06 DIAGNOSIS — N186 End stage renal disease: Secondary | ICD-10-CM

## 2014-07-06 DIAGNOSIS — Z5112 Encounter for antineoplastic immunotherapy: Secondary | ICD-10-CM

## 2014-07-06 LAB — CBC WITH DIFFERENTIAL/PLATELET
BASO%: 0.1 % (ref 0.0–2.0)
BASOS ABS: 0 10*3/uL (ref 0.0–0.1)
EOS ABS: 0.1 10*3/uL (ref 0.0–0.5)
EOS%: 1.1 % (ref 0.0–7.0)
HEMATOCRIT: 35.8 % (ref 34.8–46.6)
HEMOGLOBIN: 11.8 g/dL (ref 11.6–15.9)
LYMPH%: 10.9 % — ABNORMAL LOW (ref 14.0–49.7)
MCH: 31.6 pg (ref 25.1–34.0)
MCHC: 33 g/dL (ref 31.5–36.0)
MCV: 95.7 fL (ref 79.5–101.0)
MONO#: 0.5 10*3/uL (ref 0.1–0.9)
MONO%: 5.1 % (ref 0.0–14.0)
NEUT#: 7.7 10*3/uL — ABNORMAL HIGH (ref 1.5–6.5)
NEUT%: 82.8 % — ABNORMAL HIGH (ref 38.4–76.8)
Platelets: 183 10*3/uL (ref 145–400)
RBC: 3.74 10*6/uL (ref 3.70–5.45)
RDW: 16.3 % — AB (ref 11.2–14.5)
WBC: 9.3 10*3/uL (ref 3.9–10.3)
lymph#: 1 10*3/uL (ref 0.9–3.3)

## 2014-07-06 LAB — COMPREHENSIVE METABOLIC PANEL (CC13)
ALK PHOS: 100 U/L (ref 40–150)
ALT: 18 U/L (ref 0–55)
ANION GAP: 13 meq/L — AB (ref 3–11)
AST: 28 U/L (ref 5–34)
Albumin: 3.4 g/dL — ABNORMAL LOW (ref 3.5–5.0)
BILIRUBIN TOTAL: 0.38 mg/dL (ref 0.20–1.20)
BUN: 39.3 mg/dL — AB (ref 7.0–26.0)
CALCIUM: 8.2 mg/dL — AB (ref 8.4–10.4)
CO2: 26 mEq/L (ref 22–29)
Chloride: 101 mEq/L (ref 98–109)
Creatinine: 5.4 mg/dL (ref 0.6–1.1)
EGFR: 8 mL/min/{1.73_m2} — AB (ref >90)
Glucose: 175 mg/dl — ABNORMAL HIGH (ref 70–140)
Potassium: 4 mEq/L (ref 3.5–5.1)
SODIUM: 140 meq/L (ref 136–145)
TOTAL PROTEIN: 7 g/dL (ref 6.4–8.3)

## 2014-07-06 MED ORDER — ONDANSETRON HCL 8 MG PO TABS
ORAL_TABLET | ORAL | Status: AC
  Filled 2014-07-06: qty 1

## 2014-07-06 MED ORDER — BORTEZOMIB CHEMO SQ INJECTION 3.5 MG (2.5MG/ML)
1.3000 mg/m2 | Freq: Once | INTRAMUSCULAR | Status: AC
  Administered 2014-07-06: 2.75 mg via SUBCUTANEOUS
  Filled 2014-07-06: qty 2.75

## 2014-07-06 MED ORDER — ONDANSETRON HCL 8 MG PO TABS
8.0000 mg | ORAL_TABLET | Freq: Once | ORAL | Status: AC
  Administered 2014-07-06: 8 mg via ORAL

## 2014-07-06 NOTE — Patient Instructions (Signed)
[facility] Discharge Instructions for Patients Receiving Chemotherapy  Today you received the following chemotherapy agents: Velcade.  To help prevent nausea and vomiting after your treatment, we encourage you to take your nausea medication as prescribed.   If you develop nausea and vomiting that is not controlled by your nausea medication, call the clinic.   BELOW ARE SYMPTOMS THAT SHOULD BE REPORTED IMMEDIATELY:  *FEVER GREATER THAN 100.5 F  *CHILLS WITH OR WITHOUT FEVER  NAUSEA AND VOMITING THAT IS NOT CONTROLLED WITH YOUR NAUSEA MEDICATION  *UNUSUAL SHORTNESS OF BREATH  *UNUSUAL BRUISING OR BLEEDING  TENDERNESS IN MOUTH AND THROAT WITH OR WITHOUT PRESENCE OF ULCERS  *URINARY PROBLEMS  *BOWEL PROBLEMS  UNUSUAL RASH Items with * indicate a potential emergency and should be followed up as soon as possible.  Feel free to call the clinic you have any questions or concerns. The clinic phone number is [phone].    

## 2014-07-06 NOTE — Progress Notes (Addendum)
[facility] [phone]   [phone]  OFFICE PROGRESS NOTE  [name], MD [place] [address]  DIAGNOSIS:  #1 plasma cell dyscrasia, multiple myeloma.  #2 anemia of chronic disease secondary to chronic kidney disease.   PRIOR THERAPY: Treatment with single agent subcutaneous Velcade 1.3 mg/m2 on a weekly basis with Decadron 40 mg by mouth weekly, status post 27 cycles. First cycle to be given 07/15/2013   CURRENT THERAPY: Weekly subcutaneous Velcade 1.3 mg/m in addition to dexamethasone 40 mg by mouth once weekly. Status post 30 weeks of treatment.  INTERVAL HISTORY: Meriel Kelliher Crace 81 y.o. female returns to the clinic today for followup visit accompanied by her daughter. She is tolerating her treatment with subcutaneous Velcade and Decadron fairly well with no significant complaints. The patient is currently on hemodialysis on Monday, Wednesday and Friday. She recently had a stent placed in her left upper extremity shunt. She's had some blistering and eschar formation after the procedure. She is scheduled to see one of the nephrologist when she has her dialysis on 07/07/2014. She will have the nephrologist take a look at her arm. She also has some bruising in this area as well. She denied having any fever or chills, no nausea or vomiting. The patient denied having any significant chest pain, shortness of breath, cough or hemoptysis. She has no significant weight loss or night sweats.  MEDICAL HISTORY: Past Medical History  Diagnosis Date  . Hypertension   . Asthma   . GERD (gastroesophageal reflux disease)   . Neuropathy in diabetes     Hx: of  . COPD (chronic obstructive pulmonary disease)   . Anemia   . High cholesterol   . Pulmonary embolism 1987  . Type II diabetes mellitus   . History of blood transfusion 1987; 2011    "related to lung OR; HgB went down to 5"  . Arthritis     "hips, knees,  shoulders" (11/10/2013)  . Chronic lower back pain   . Gout   . ESRD (end stage renal disease) on dialysis     "1st treatment 11/09/2013"  . Kidney stones   . Multiple myeloma     ALLERGIES:  is allergic to penicillins cross reactors.  MEDICATIONS:  Current Outpatient Prescriptions  Medication Sig Dispense Refill  . acyclovir (ZOVIRAX) 400 MG tablet TAKE 1 TABLET BY MOUTH DAILY 30 tablet 1  . albuterol (PROVENTIL HFA;VENTOLIN HFA) 108 (90 BASE) MCG/ACT inhaler Inhale 1 puff into the lungs 2 (two) times daily as needed for wheezing or shortness of breath.    . allopurinol (ZYLOPRIM) 100 MG tablet Take 100 mg by mouth daily with breakfast.     . amLODipine (NORVASC) 5 MG tablet Take 5 mg by mouth daily with breakfast.     . cyclobenzaprine (FLEXERIL) 5 MG tablet   0  . dexamethasone (DECADRON) 4 MG tablet Take 10 tablets (40 mg total) by mouth once a week. 40 tablet 1  . gabapentin (NEURONTIN) 100 MG capsule Take 100 mg by mouth 3 (three) times daily.    Marland Kitchen glimepiride (AMARYL) 1 MG tablet Take 1 mg by mouth daily with breakfast.     . HYDROcodone-acetaminophen (NORCO) 7.5-325 MG per tablet Take 1 tablet by mouth every 6 (six) hours as needed for moderate pain.    Marland Kitchen lidocaine-prilocaine (EMLA) cream     . Multiple Vitamins-Minerals (MULTIVITAMIN WITH MINERALS) tablet Take 2 tablets by mouth daily.    Marland Kitchen  multivitamin (RENA-VIT) TABS tablet Take 1 tablet by mouth daily.    . ondansetron (ZOFRAN) 8 MG tablet Take 1 tablet (8 mg total) by mouth every 8 (eight) hours as needed for nausea or vomiting. 20 tablet 1  . ONE TOUCH ULTRA TEST test strip     . Oxycodone HCl 20 MG TABS Take 20 mg by mouth every 12 (twelve) hours.     . pantoprazole (PROTONIX) 40 MG tablet Take 40 mg by mouth 2 (two) times daily.     . saxagliptin HCl (ONGLYZA) 2.5 MG TABS tablet Take 2.5 mg by mouth daily with breakfast.      No current facility-administered medications for this visit.    SURGICAL HISTORY:  Past  Surgical History  Procedure Laterality Date  . Lung removal, partial Left 1987  . Cholecystectomy    . Av fistula placement Left 2011    "lower arm; never used"  . Colonoscopy    . Abdominoplasty  ~ 1983    "tummy tuck"  . Abdominal hysterectomy  1964  . Av fistula placement Left 05/15/2013    Procedure: INSERTION OF ARTERIOVENOUS (AV) GORE-TEX GRAFT ARM-LEFT UPPER ARM;  Surgeon: Mal Misty, MD;  Location: [facility];  Service: Vascular;  Laterality: Left;  . Appendectomy  1954  . Umbilical hernia repair  ~ 1983  . Hernia repair    . Tubal ligation  1964  . Cystoscopy w/ stone manipulation  1980's    REVIEW OF SYSTEMS:  Constitutional: positive for fatigue Eyes: negative Ears, nose, mouth, throat, and face: negative Respiratory: negative Cardiovascular: negative Gastrointestinal: negative Genitourinary:negative Integument/breast: positive for skin lesion(s) and left upper extremity after stent placement Hematologic/lymphatic: negative Musculoskeletal:negative Neurological: negative Behavioral/Psych: negative Endocrine: negative Allergic/Immunologic: negative   PHYSICAL EXAMINATION: General appearance: alert, cooperative, fatigued and no distress Head: Normocephalic, without obvious abnormality, atraumatic Neck: no adenopathy Lymph nodes: Cervical, supraclavicular, and axillary nodes normal. Resp: clear to auscultation bilaterally Back: symmetric, no curvature. ROM normal. No CVA tenderness. Cardio: regular rate and rhythm, S1, S2 normal, no murmur, click, rub or gallop GI: soft, non-tender; bowel sounds normal; no masses,  no organomegaly Extremities: extremities normal, atraumatic, no cyanosis or edema Neurologic: Alert and oriented X 3, normal strength and tone. Normal symmetric reflexes. Normal coordination and gait Skin: Left upper extremity reveals ecchymosis in the medial aspect of the left upper extremity in the bicep triceps region and there is also around eschar  formation in approximately half centimeter as well as several smaller areas  scattered down the arm in a more linear formation, no evidence of infection  ECOG PERFORMANCE STATUS: 2 - Symptomatic, <50% confined to bed  Blood pressure 114/60, pulse 72, temperature 98.6 F (37 C), temperature source Oral, resp. rate 18, height $RemoveBe'5\' 5"'RiwxeYDWO$  (1.651 m), weight 209 lb 8 oz (95.029 kg).  LABORATORY DATA: Lab Results  Component Value Date   WBC 9.3 07/06/2014   HGB 11.8 07/06/2014   HCT 35.8 07/06/2014   MCV 95.7 07/06/2014   PLT 183 07/06/2014      Chemistry      Component Value Date/Time   NA 140 07/06/2014 0846   NA 143 11/10/2013 0748   K 4.0 07/06/2014 0846   K 3.4* 11/10/2013 0748   CL 98 11/10/2013 0748   CL 98 09/17/2012 1242   CO2 26 07/06/2014 0846   CO2 30 11/10/2013 0748   BUN 39.3* 07/06/2014 0846   BUN 80* 11/10/2013 0748   CREATININE 5.4* 07/06/2014 8676  CREATININE 3.66* 11/10/2013 0748      Component Value Date/Time   CALCIUM 8.2* 07/06/2014 0846   CALCIUM 9.5 11/10/2013 0748   CALCIUM 8.6 12/02/2012 0933   ALKPHOS 100 07/06/2014 0846   ALKPHOS 81 03/26/2012 1643   AST 28 07/06/2014 0846   AST 23 03/26/2012 1643   ALT 18 07/06/2014 0846   ALT 14 03/26/2012 1643   BILITOT 0.38 07/06/2014 0846   BILITOT 0.2* 03/26/2012 1643     Other lab results: Showed evidence for disease progression especially with the free lambda light chain.  ASSESSMENT: This is a very pleasant 81 years old [place] American female with plasma cell dyscrasia completed treatment with weekly subcutaneous Velcade and Decadron status post 30 weekly doses and tolerating it fairly well  She is tolerating her treatment with no significant complaints. The patient was discussed with and also seen by Dr. Julien Nordmann.  She will continue withr current treatment with Velcade and Decadron as a scheduled.She is advised to show her left upper extremity to her nephrologist her dialysis appointment tomorrow. She'll  follow-up in 3 weeks with recent protein studies drawn in 2 weeks so that the results can be discussed when she returns in 3 weeks for another symptom management visit.   She was advised to call immediately if she has any concerning symptoms in the interval.  Carlton Adam, PA-C 07/06/2014  ADDENDUM: Hematology/Oncology Attending: I had a face to face encounter with the patient. I recommended her care plan. This is a very pleasant 81 years old African-American female with multiple myeloma currently undergoing treatment with subcutaneous Velcade and Decadron alone weekly basis status post 30 weeks of treatment and tolerating it well. She recently had revision and distant of her AV fistula in the left arm. She has a little bit of bruises at that area but no significant evidence for infection. I recommended for the patient to proceed with her treatment today as scheduled. She would come back for follow-up visit in 3 weeks for reevaluation after repeating myeloma panel. She was advised to call immediately if she has any concerning symptoms in the interval.  Disclaimer: This note was dictated with voice recognition software. Similar sounding words can inadvertently be transcribed and may be missed upon review. Eilleen Kempf., MD 07/06/2014

## 2014-07-06 NOTE — Progress Notes (Signed)
Okay to treat today with creatinine of 5.4 per [place], [place]

## 2014-07-06 NOTE — Patient Instructions (Signed)
Continue labs and chemotherapy as scheduled Follow up in 3 weeks 

## 2014-07-06 NOTE — Telephone Encounter (Signed)
gv adn printed appt sched and avs forpt for Feb and March °

## 2014-07-13 ENCOUNTER — Telehealth: Payer: Self-pay | Admitting: *Deleted

## 2014-07-13 ENCOUNTER — Other Ambulatory Visit (HOSPITAL_BASED_OUTPATIENT_CLINIC_OR_DEPARTMENT_OTHER): Payer: Medicare Other

## 2014-07-13 ENCOUNTER — Ambulatory Visit (HOSPITAL_BASED_OUTPATIENT_CLINIC_OR_DEPARTMENT_OTHER): Payer: Medicare Other

## 2014-07-13 DIAGNOSIS — C9000 Multiple myeloma not having achieved remission: Secondary | ICD-10-CM

## 2014-07-13 DIAGNOSIS — Z5112 Encounter for antineoplastic immunotherapy: Secondary | ICD-10-CM

## 2014-07-13 DIAGNOSIS — C9002 Multiple myeloma in relapse: Secondary | ICD-10-CM

## 2014-07-13 LAB — COMPREHENSIVE METABOLIC PANEL (CC13)
ALBUMIN: 3.4 g/dL — AB (ref 3.5–5.0)
ALT: 20 U/L (ref 0–55)
AST: 16 U/L (ref 5–34)
Alkaline Phosphatase: 88 U/L (ref 40–150)
Anion Gap: 13 mEq/L — ABNORMAL HIGH (ref 3–11)
BUN: 33.9 mg/dL — ABNORMAL HIGH (ref 7.0–26.0)
CO2: 27 meq/L (ref 22–29)
Calcium: 8.7 mg/dL (ref 8.4–10.4)
Chloride: 100 mEq/L (ref 98–109)
Creatinine: 5.2 mg/dL (ref 0.6–1.1)
EGFR: 8 mL/min/{1.73_m2} — ABNORMAL LOW (ref >90)
GLUCOSE: 132 mg/dL (ref 70–140)
POTASSIUM: 4.8 meq/L (ref 3.5–5.1)
SODIUM: 140 meq/L (ref 136–145)
TOTAL PROTEIN: 7.2 g/dL (ref 6.4–8.3)
Total Bilirubin: 0.39 mg/dL (ref 0.20–1.20)

## 2014-07-13 LAB — CBC WITH DIFFERENTIAL/PLATELET
BASO%: 0.8 % (ref 0.0–2.0)
BASOS ABS: 0.1 10*3/uL (ref 0.0–0.1)
EOS ABS: 0.1 10*3/uL (ref 0.0–0.5)
EOS%: 1.8 % (ref 0.0–7.0)
HCT: 36.4 % (ref 34.8–46.6)
HEMOGLOBIN: 11.6 g/dL (ref 11.6–15.9)
LYMPH%: 15.5 % (ref 14.0–49.7)
MCH: 30.3 pg (ref 25.1–34.0)
MCHC: 31.7 g/dL (ref 31.5–36.0)
MCV: 95.5 fL (ref 79.5–101.0)
MONO#: 0.9 10*3/uL (ref 0.1–0.9)
MONO%: 12.5 % (ref 0.0–14.0)
NEUT%: 69.4 % (ref 38.4–76.8)
NEUTROS ABS: 5.2 10*3/uL (ref 1.5–6.5)
PLATELETS: 174 10*3/uL (ref 145–400)
RBC: 3.81 10*6/uL (ref 3.70–5.45)
RDW: 17.2 % — AB (ref 11.2–14.5)
WBC: 7.5 10*3/uL (ref 3.9–10.3)
lymph#: 1.2 10*3/uL (ref 0.9–3.3)

## 2014-07-13 MED ORDER — ONDANSETRON HCL 8 MG PO TABS
ORAL_TABLET | ORAL | Status: AC
  Filled 2014-07-13: qty 1

## 2014-07-13 MED ORDER — ONDANSETRON HCL 8 MG PO TABS
8.0000 mg | ORAL_TABLET | Freq: Once | ORAL | Status: AC
  Administered 2014-07-13: 8 mg via ORAL

## 2014-07-13 MED ORDER — BORTEZOMIB CHEMO SQ INJECTION 3.5 MG (2.5MG/ML)
1.3000 mg/m2 | Freq: Once | INTRAMUSCULAR | Status: AC
  Administered 2014-07-13: 2.75 mg via SUBCUTANEOUS
  Filled 2014-07-13: qty 2.75

## 2014-07-13 NOTE — Patient Instructions (Signed)
Bortezomib injection What is this medicine? BORTEZOMIB (bor TEZ oh mib) is a chemotherapy drug. It slows the growth of cancer cells. This medicine is used to treat multiple myeloma, and certain lymphomas, such as mantle-cell lymphoma. This medicine may be used for other purposes; ask your health care provider or pharmacist if you have questions. COMMON BRAND NAME(S): Velcade What should I tell my health care provider before I take this medicine? They need to know if you have any of these conditions: -diabetes -heart disease -irregular heartbeat -liver disease -on hemodialysis -low blood counts, like low white blood cells, platelets, or hemoglobin -peripheral neuropathy -taking medicine for blood pressure -an unusual or allergic reaction to bortezomib, mannitol, boron, other medicines, foods, dyes, or preservatives -pregnant or trying to get pregnant -breast-feeding How should I use this medicine? This medicine is for injection into a vein or for injection under the skin. It is given by a health care professional in a hospital or clinic setting. Talk to your pediatrician regarding the use of this medicine in children. Special care may be needed. Overdosage: If you think you have taken too much of this medicine contact a poison control center or emergency room at once. NOTE: This medicine is only for you. Do not share this medicine with others. What if I miss a dose? It is important not to miss your dose. Call your doctor or health care professional if you are unable to keep an appointment. What may interact with this medicine? This medicine may interact with the following medications: -ketoconazole -rifampin -ritonavir -St. John's Wort This list may not describe all possible interactions. Give your health care provider a list of all the medicines, herbs, non-prescription drugs, or dietary supplements you use. Also tell them if you smoke, drink alcohol, or use illegal drugs. Some items  may interact with your medicine. What should I watch for while using this medicine? Visit your doctor for checks on your progress. This drug may make you feel generally unwell. This is not uncommon, as chemotherapy can affect healthy cells as well as cancer cells. Report any side effects. Continue your course of treatment even though you feel ill unless your doctor tells you to stop. You may get drowsy or dizzy. Do not drive, use machinery, or do anything that needs mental alertness until you know how this medicine affects you. Do not stand or sit up quickly, especially if you are an older patient. This reduces the risk of dizzy or fainting spells. In some cases, you may be given additional medicines to help with side effects. Follow all directions for their use. Call your doctor or health care professional for advice if you get a fever, chills or sore throat, or other symptoms of a cold or flu. Do not treat yourself. This drug decreases your body's ability to fight infections. Try to avoid being around people who are sick. This medicine may increase your risk to bruise or bleed. Call your doctor or health care professional if you notice any unusual bleeding. You may need blood work done while you are taking this medicine. In some patients, this medicine may cause a serious brain infection that may cause death. If you have any problems seeing, thinking, speaking, walking, or standing, tell your doctor right away. If you cannot reach your doctor, urgently seek other source of medical care. Do not become pregnant while taking this medicine. Women should inform their doctor if they wish to become pregnant or think they might be pregnant. There is   a potential for serious side effects to an unborn child. Talk to your health care professional or pharmacist for more information. Do not breast-feed an infant while taking this medicine. Check with your doctor or health care professional if you get an attack of  severe diarrhea, nausea and vomiting, or if you sweat a lot. The loss of too much body fluid can make it dangerous for you to take this medicine. What side effects may I notice from receiving this medicine? Side effects that you should report to your doctor or health care professional as soon as possible: -allergic reactions like skin rash, itching or hives, swelling of the face, lips, or tongue -breathing problems -changes in hearing -changes in vision -fast, irregular heartbeat -feeling faint or lightheaded, falls -pain, tingling, numbness in the hands or feet -right upper belly pain -seizures -swelling of the ankles, feet, hands -unusual bleeding or bruising -unusually weak or tired -vomiting -yellowing of the eyes or skin Side effects that usually do not require medical attention (report to your doctor or health care professional if they continue or are bothersome): -changes in emotions or moods -constipation -diarrhea -loss of appetite -headache -irritation at site where injected -nausea This list may not describe all possible side effects. Call your doctor for medical advice about side effects. You may report side effects to FDA at 1-800-FDA-1088. Where should I keep my medicine? This drug is given in a hospital or clinic and will not be stored at home. NOTE: This sheet is a summary. It may not cover all possible information. If you have questions about this medicine, talk to your doctor, pharmacist, or health care provider.  2015, [employer]. (2013-03-16 12:46:32)  

## 2014-07-13 NOTE — Telephone Encounter (Signed)
Dr Julien Nordmann is aware of Cr 5.4, no further orders at this time, pt is currently undergoing dialysis

## 2014-07-20 ENCOUNTER — Ambulatory Visit (HOSPITAL_BASED_OUTPATIENT_CLINIC_OR_DEPARTMENT_OTHER): Payer: Medicare Other

## 2014-07-20 ENCOUNTER — Other Ambulatory Visit (HOSPITAL_BASED_OUTPATIENT_CLINIC_OR_DEPARTMENT_OTHER): Payer: Medicare Other

## 2014-07-20 DIAGNOSIS — C9002 Multiple myeloma in relapse: Secondary | ICD-10-CM

## 2014-07-20 DIAGNOSIS — C9000 Multiple myeloma not having achieved remission: Secondary | ICD-10-CM

## 2014-07-20 DIAGNOSIS — D631 Anemia in chronic kidney disease: Secondary | ICD-10-CM

## 2014-07-20 DIAGNOSIS — N186 End stage renal disease: Secondary | ICD-10-CM

## 2014-07-20 DIAGNOSIS — Z5112 Encounter for antineoplastic immunotherapy: Secondary | ICD-10-CM

## 2014-07-20 LAB — CBC WITH DIFFERENTIAL/PLATELET
BASO%: 0.5 % (ref 0.0–2.0)
BASOS ABS: 0 10*3/uL (ref 0.0–0.1)
EOS ABS: 0.1 10*3/uL (ref 0.0–0.5)
EOS%: 1.5 % (ref 0.0–7.0)
HEMATOCRIT: 35.9 % (ref 34.8–46.6)
HGB: 11.4 g/dL — ABNORMAL LOW (ref 11.6–15.9)
LYMPH%: 11.6 % — AB (ref 14.0–49.7)
MCH: 30.5 pg (ref 25.1–34.0)
MCHC: 31.8 g/dL (ref 31.5–36.0)
MCV: 96 fL (ref 79.5–101.0)
MONO#: 0.6 10*3/uL (ref 0.1–0.9)
MONO%: 6.6 % (ref 0.0–14.0)
NEUT#: 6.7 10*3/uL — ABNORMAL HIGH (ref 1.5–6.5)
NEUT%: 79.8 % — ABNORMAL HIGH (ref 38.4–76.8)
PLATELETS: 199 10*3/uL (ref 145–400)
RBC: 3.74 10*6/uL (ref 3.70–5.45)
RDW: 17.2 % — ABNORMAL HIGH (ref 11.2–14.5)
WBC: 8.4 10*3/uL (ref 3.9–10.3)
lymph#: 1 10*3/uL (ref 0.9–3.3)

## 2014-07-20 LAB — COMPREHENSIVE METABOLIC PANEL (CC13)
ALK PHOS: 99 U/L (ref 40–150)
ALT: 17 U/L (ref 0–55)
AST: 16 U/L (ref 5–34)
Albumin: 3.3 g/dL — ABNORMAL LOW (ref 3.5–5.0)
Anion Gap: 14 mEq/L — ABNORMAL HIGH (ref 3–11)
BILIRUBIN TOTAL: 0.34 mg/dL (ref 0.20–1.20)
BUN: 40.2 mg/dL — ABNORMAL HIGH (ref 7.0–26.0)
CO2: 29 mEq/L (ref 22–29)
Calcium: 8.9 mg/dL (ref 8.4–10.4)
Chloride: 102 mEq/L (ref 98–109)
Creatinine: 4.6 mg/dL (ref 0.6–1.1)
EGFR: 10 mL/min/{1.73_m2} — ABNORMAL LOW (ref >90)
Glucose: 109 mg/dl (ref 70–140)
Potassium: 3.9 mEq/L (ref 3.5–5.1)
SODIUM: 145 meq/L (ref 136–145)
TOTAL PROTEIN: 6.8 g/dL (ref 6.4–8.3)

## 2014-07-20 LAB — LACTATE DEHYDROGENASE (CC13): LDH: 236 U/L (ref 125–245)

## 2014-07-20 MED ORDER — BORTEZOMIB CHEMO SQ INJECTION 3.5 MG (2.5MG/ML)
1.3000 mg/m2 | Freq: Once | INTRAMUSCULAR | Status: AC
  Administered 2014-07-20: 2.75 mg via SUBCUTANEOUS
  Filled 2014-07-20: qty 2.75

## 2014-07-20 MED ORDER — ONDANSETRON HCL 8 MG PO TABS
8.0000 mg | ORAL_TABLET | Freq: Once | ORAL | Status: AC
  Administered 2014-07-20: 8 mg via ORAL

## 2014-07-20 MED ORDER — ONDANSETRON HCL 8 MG PO TABS
ORAL_TABLET | ORAL | Status: AC
  Filled 2014-07-20: qty 1

## 2014-07-20 NOTE — Progress Notes (Signed)
Pt here for chemo.  Stated she has had more night sweating -  Pt had night sweat before starting chemo - but more now.  Dr. Julien Nordmann notified of above info.

## 2014-07-20 NOTE — Progress Notes (Signed)
Per Dr Vista Mink, okay to treat with velcade today with current lab work.  Regarding night sweats, pt will be evaluated at f/u 07/27/14.

## 2014-07-20 NOTE — Patient Instructions (Signed)
[facility] Discharge Instructions for Patients Receiving Chemotherapy  Today you received the following chemotherapy agents :  Velcade.  To help prevent nausea and vomiting after your treatment, we encourage you to take your nausea medication as prescribed by your physician.   If you develop nausea and vomiting that is not controlled by your nausea medication, call the clinic.   BELOW ARE SYMPTOMS THAT SHOULD BE REPORTED IMMEDIATELY:  *FEVER GREATER THAN 100.5 F  *CHILLS WITH OR WITHOUT FEVER  NAUSEA AND VOMITING THAT IS NOT CONTROLLED WITH YOUR NAUSEA MEDICATION  *UNUSUAL SHORTNESS OF BREATH  *UNUSUAL BRUISING OR BLEEDING  TENDERNESS IN MOUTH AND THROAT WITH OR WITHOUT PRESENCE OF ULCERS  *URINARY PROBLEMS  *BOWEL PROBLEMS  UNUSUAL RASH Items with * indicate a potential emergency and should be followed up as soon as possible.  Feel free to call the clinic you have any questions or concerns. The clinic phone number is [phone].

## 2014-07-21 LAB — IGG, IGA, IGM
IGG (IMMUNOGLOBIN G), SERUM: 1170 mg/dL (ref 690–1700)
IGM, SERUM: 111 mg/dL (ref 52–322)
IgA: 135 mg/dL (ref 69–380)

## 2014-07-22 LAB — KAPPA/LAMBDA LIGHT CHAINS
KAPPA LAMBDA RATIO: 1.12 (ref 0.26–1.65)
Kappa free light chain: 9.66 mg/dL — ABNORMAL HIGH (ref 0.33–1.94)
Lambda Free Lght Chn: 8.63 mg/dL — ABNORMAL HIGH (ref 0.57–2.63)

## 2014-07-22 LAB — BETA 2 MICROGLOBULIN, SERUM: Beta-2 Microglobulin: 20.6 mg/L — ABNORMAL HIGH (ref <=2.51)

## 2014-07-26 ENCOUNTER — Other Ambulatory Visit: Payer: Self-pay | Admitting: Physician Assistant

## 2014-07-26 DIAGNOSIS — C9000 Multiple myeloma not having achieved remission: Secondary | ICD-10-CM

## 2014-07-27 ENCOUNTER — Ambulatory Visit: Payer: 59

## 2014-07-27 ENCOUNTER — Other Ambulatory Visit: Payer: Medicare Other

## 2014-07-27 ENCOUNTER — Other Ambulatory Visit (HOSPITAL_BASED_OUTPATIENT_CLINIC_OR_DEPARTMENT_OTHER): Payer: Medicare Other

## 2014-07-27 ENCOUNTER — Encounter: Payer: Self-pay | Admitting: Physician Assistant

## 2014-07-27 ENCOUNTER — Ambulatory Visit (HOSPITAL_BASED_OUTPATIENT_CLINIC_OR_DEPARTMENT_OTHER): Payer: Medicare Other

## 2014-07-27 ENCOUNTER — Ambulatory Visit (HOSPITAL_BASED_OUTPATIENT_CLINIC_OR_DEPARTMENT_OTHER): Payer: Medicare Other | Admitting: Physician Assistant

## 2014-07-27 ENCOUNTER — Telehealth: Payer: Self-pay | Admitting: Internal Medicine

## 2014-07-27 VITALS — BP 116/42 | HR 70 | Temp 98.5°F | Resp 18 | Ht 65.0 in | Wt 208.8 lb

## 2014-07-27 DIAGNOSIS — Z5112 Encounter for antineoplastic immunotherapy: Secondary | ICD-10-CM

## 2014-07-27 DIAGNOSIS — C9000 Multiple myeloma not having achieved remission: Secondary | ICD-10-CM

## 2014-07-27 DIAGNOSIS — D631 Anemia in chronic kidney disease: Secondary | ICD-10-CM

## 2014-07-27 DIAGNOSIS — N186 End stage renal disease: Secondary | ICD-10-CM

## 2014-07-27 LAB — COMPREHENSIVE METABOLIC PANEL (CC13)
ALT: 14 U/L (ref 0–55)
AST: 16 U/L (ref 5–34)
Albumin: 3.3 g/dL — ABNORMAL LOW (ref 3.5–5.0)
Alkaline Phosphatase: 95 U/L (ref 40–150)
Anion Gap: 14 mEq/L — ABNORMAL HIGH (ref 3–11)
BUN: 34.2 mg/dL — AB (ref 7.0–26.0)
CHLORIDE: 99 meq/L (ref 98–109)
CO2: 27 mEq/L (ref 22–29)
Calcium: 8.5 mg/dL (ref 8.4–10.4)
Creatinine: 5.2 mg/dL (ref 0.6–1.1)
EGFR: 8 mL/min/{1.73_m2} — ABNORMAL LOW (ref >90)
Glucose: 242 mg/dl — ABNORMAL HIGH (ref 70–140)
Potassium: 4.2 mEq/L (ref 3.5–5.1)
Sodium: 140 mEq/L (ref 136–145)
Total Bilirubin: 0.41 mg/dL (ref 0.20–1.20)
Total Protein: 6.9 g/dL (ref 6.4–8.3)

## 2014-07-27 LAB — CBC WITH DIFFERENTIAL/PLATELET
BASO%: 0.8 % (ref 0.0–2.0)
BASOS ABS: 0.1 10*3/uL (ref 0.0–0.1)
EOS ABS: 0.1 10*3/uL (ref 0.0–0.5)
EOS%: 0.9 % (ref 0.0–7.0)
HCT: 34.1 % — ABNORMAL LOW (ref 34.8–46.6)
HEMOGLOBIN: 11 g/dL — AB (ref 11.6–15.9)
LYMPH%: 8.6 % — ABNORMAL LOW (ref 14.0–49.7)
MCH: 31 pg (ref 25.1–34.0)
MCHC: 32.2 g/dL (ref 31.5–36.0)
MCV: 96.2 fL (ref 79.5–101.0)
MONO#: 0.4 10*3/uL (ref 0.1–0.9)
MONO%: 4.1 % (ref 0.0–14.0)
NEUT%: 85.6 % — ABNORMAL HIGH (ref 38.4–76.8)
NEUTROS ABS: 7.5 10*3/uL — AB (ref 1.5–6.5)
PLATELETS: 202 10*3/uL (ref 145–400)
RBC: 3.55 10*6/uL — AB (ref 3.70–5.45)
RDW: 17.6 % — ABNORMAL HIGH (ref 11.2–14.5)
WBC: 8.7 10*3/uL (ref 3.9–10.3)
lymph#: 0.7 10*3/uL — ABNORMAL LOW (ref 0.9–3.3)

## 2014-07-27 MED ORDER — ONDANSETRON HCL 8 MG PO TABS
ORAL_TABLET | ORAL | Status: AC
  Filled 2014-07-27: qty 1

## 2014-07-27 MED ORDER — BORTEZOMIB CHEMO SQ INJECTION 3.5 MG (2.5MG/ML)
1.3000 mg/m2 | Freq: Once | INTRAMUSCULAR | Status: AC
  Administered 2014-07-27: 2.75 mg via SUBCUTANEOUS
  Filled 2014-07-27: qty 2.75

## 2014-07-27 MED ORDER — ONDANSETRON HCL 8 MG PO TABS
8.0000 mg | ORAL_TABLET | Freq: Once | ORAL | Status: AC
  Administered 2014-07-27: 8 mg via ORAL

## 2014-07-27 NOTE — Progress Notes (Addendum)
[facility] [phone]   Fax:(336) [phone]  OFFICE PROGRESS NOTE  [name], MD [place] [address]  DIAGNOSIS:  #1 plasma cell dyscrasia, multiple myeloma.  #2 anemia of chronic disease secondary to chronic kidney disease.   PRIOR THERAPY: Treatment with single agent subcutaneous Velcade 1.3 mg/m2 on a weekly basis with Decadron 40 mg by mouth weekly, status post 27 cycles. First cycle to be given 07/15/2013   CURRENT THERAPY: Weekly subcutaneous Velcade 1.3 mg/m in addition to dexamethasone 40 mg by mouth once weekly. Status post 33 weeks of treatment.  INTERVAL HISTORY: [name] 81 y.o. female returns to the clinic today for followup visit accompanied by her daughter. She is tolerating her treatment with subcutaneous Velcade and Decadron fairly well with no significant complaints. The patient is currently on hemodialysis on Monday, Wednesday and Friday. She is interested in finding another center to receive her dialysis treatments. She denied having any fever or chills, no nausea or vomiting. The patient denied having any significant chest pain, shortness of breath, cough or hemoptysis. She has no significant weight loss or night sweats. She recently had repeat protein studies drawn and is here to discuss the results.  MEDICAL HISTORY: Past Medical History  Diagnosis Date  . Hypertension   . Asthma   . GERD (gastroesophageal reflux disease)   . Neuropathy in diabetes     Hx: of  . COPD (chronic obstructive pulmonary disease)   . Anemia   . High cholesterol   . Pulmonary embolism 1987  . Type II diabetes mellitus   . History of blood transfusion 1987; 2011    "related to lung OR; HgB went down to 5"  . Arthritis     "hips, knees, shoulders" (11/10/2013)  . Chronic lower back pain   . Gout   . ESRD (end stage renal disease) on dialysis     "1st treatment 11/09/2013"  . Kidney stones   . Multiple  myeloma     ALLERGIES:  is allergic to penicillins cross reactors.  MEDICATIONS:  Current Outpatient Prescriptions  Medication Sig Dispense Refill  . acyclovir (ZOVIRAX) 400 MG tablet TAKE 1 TABLET BY MOUTH DAILY 30 tablet 1  . albuterol (PROVENTIL HFA;VENTOLIN HFA) 108 (90 BASE) MCG/ACT inhaler Inhale 1 puff into the lungs 2 (two) times daily as needed for wheezing or shortness of breath.    . allopurinol (ZYLOPRIM) 100 MG tablet Take 100 mg by mouth daily with breakfast.     . amLODipine (NORVASC) 5 MG tablet Take 5 mg by mouth daily with breakfast.     . cyclobenzaprine (FLEXERIL) 5 MG tablet   0  . dexamethasone (DECADRON) 4 MG tablet Take 10 tablets (40 mg total) by mouth once a week. 40 tablet 1  . gabapentin (NEURONTIN) 100 MG capsule Take 100 mg by mouth 3 (three) times daily.    Marland Kitchen glimepiride (AMARYL) 1 MG tablet Take 1 mg by mouth daily with breakfast.     . HYDROcodone-acetaminophen (NORCO) 7.5-325 MG per tablet Take 1 tablet by mouth every 6 (six) hours as needed for moderate pain.    Marland Kitchen lidocaine-prilocaine (EMLA) cream     . Multiple Vitamins-Minerals (MULTIVITAMIN WITH MINERALS) tablet Take 2 tablets by mouth daily.    . multivitamin (RENA-VIT) TABS tablet Take 1 tablet by mouth daily.    . ondansetron (ZOFRAN) 8 MG tablet Take 1 tablet (8 mg total) by mouth every 8 (  eight) hours as needed for nausea or vomiting. 20 tablet 1  . ONE TOUCH ULTRA TEST test strip     . Oxycodone HCl 20 MG TABS Take 20 mg by mouth every 12 (twelve) hours.     . pantoprazole (PROTONIX) 40 MG tablet Take 40 mg by mouth 2 (two) times daily.     . saxagliptin HCl (ONGLYZA) 2.5 MG TABS tablet Take 2.5 mg by mouth daily with breakfast.      No current facility-administered medications for this visit.    SURGICAL HISTORY:  Past Surgical History  Procedure Laterality Date  . Lung removal, partial Left 1987  . Cholecystectomy    . Av fistula placement Left 2011    "lower arm; never used"  .  Colonoscopy    . Abdominoplasty  ~ 1983    "tummy tuck"  . Abdominal hysterectomy  1964  . Av fistula placement Left 05/15/2013    Procedure: INSERTION OF ARTERIOVENOUS (AV) GORE-TEX GRAFT ARM-LEFT UPPER ARM;  Surgeon: Mal Misty, MD;  Location: [facility];  Service: Vascular;  Laterality: Left;  . Appendectomy  1954  . Umbilical hernia repair  ~ 1983  . Hernia repair    . Tubal ligation  1964  . Cystoscopy w/ stone manipulation  1980's    REVIEW OF SYSTEMS:  Constitutional: positive for fatigue Eyes: negative Ears, nose, mouth, throat, and face: negative Respiratory: negative Cardiovascular: negative Gastrointestinal: negative Genitourinary:negative Integument/breast: positive for skin lesion(s) and left upper extremity after stent placement Hematologic/lymphatic: negative Musculoskeletal:negative Neurological: negative Behavioral/Psych: negative Endocrine: negative Allergic/Immunologic: negative   PHYSICAL EXAMINATION: General appearance: alert, cooperative, fatigued and no distress Head: Normocephalic, without obvious abnormality, atraumatic Neck: no adenopathy Lymph nodes: Cervical, supraclavicular, and axillary nodes normal. Resp: clear to auscultation bilaterally Back: symmetric, no curvature. ROM normal. No CVA tenderness. Cardio: regular rate and rhythm, S1, S2 normal, no murmur, click, rub or gallop GI: soft, non-tender; bowel sounds normal; no masses,  no organomegaly Extremities: extremities normal, atraumatic, no cyanosis or edema Neurologic: Alert and oriented X 3, normal strength and tone. Normal symmetric reflexes. Normal coordination and gait  ECOG PERFORMANCE STATUS: 1 - Symptomatic but completely ambulatory  Blood pressure 116/42, pulse 70, temperature 98.5 F (36.9 C), temperature source Oral, resp. rate 18, height $RemoveBe'5\' 5"'[id number]$  (1.651 m), weight 208 lb 12.8 oz (94.711 kg), SpO2 99 %.  LABORATORY DATA: Lab Results  Component Value Date   WBC 8.7 07/27/2014    HGB 11.0* 07/27/2014   HCT 34.1* 07/27/2014   MCV 96.2 07/27/2014   PLT 202 07/27/2014      Chemistry      Component Value Date/Time   NA 140 07/27/2014 1056   NA 143 11/10/2013 0748   K 4.2 07/27/2014 1056   K 3.4* 11/10/2013 0748   CL 98 11/10/2013 0748   CL 98 09/17/2012 1242   CO2 27 07/27/2014 1056   CO2 30 11/10/2013 0748   BUN 34.2* 07/27/2014 1056   BUN 80* 11/10/2013 0748   CREATININE 5.2* 07/27/2014 1056   CREATININE 3.66* 11/10/2013 0748      Component Value Date/Time   CALCIUM 8.5 07/27/2014 1056   CALCIUM 9.5 11/10/2013 0748   CALCIUM 8.6 12/02/2012 0933   ALKPHOS 95 07/27/2014 1056   ALKPHOS 81 03/26/2012 1643   AST 16 07/27/2014 1056   AST 23 03/26/2012 1643   ALT 14 07/27/2014 1056   ALT 14 03/26/2012 1643   BILITOT 0.41 07/27/2014 1056   BILITOT 0.2* 03/26/2012 1643  ASSESSMENT: This is a very pleasant 81 years old [place] American female with plasma cell dyscrasia completed treatment with weekly subcutaneous Velcade and Decadron status post 33 weekly doses and tolerating it fairly well  She is tolerating her treatment with no significant complaints. The patient was discussed with and also seen by Dr. Julien Nordmann. Her recent protein studies reveal some improvement in her disease. The patient desires to take a break from treatment at this time. She will have a 2 month break from treatment and return in 2 months with another set of repeat protein studies to reevaluate her disease. She fully understands that she will likely have to resume treatment of some type should the repeat protein studies show any sign of disease progression.   She was advised to call immediately if she has any concerning symptoms in the interval.  Carlton Adam, PA-C 07/27/2014  ADDENDUM: Hematology/Oncology Attending: I had a face to face encounter with the patient. I recommended her care plan. This is a very pleasant 81 years old African-American female with multiple myeloma  as well as end-stage renal disease and currently on dialysis. She is currently on treatment with weekly subcutaneous Velcade and oral Decadron status post 33 weekly doses. She has been tolerating her treatment fairly well with no significant adverse effects but the patient has always been looking for a break off treatment.  Her myeloma panel performed recently showed further improvement in her disease. I discussed the lab result with the patient and her daughter. The patient the option of continuing treatment with the same regimen versus taking a break for the next 2 months. The patient would like to be on observation for the next few months. We will see her back for follow-up visit in 2 months with repeat myeloma panel for reevaluation of her disease. For the increased Asian disease, the patient will continue on hemodialysis as a scheduled by her nephrologist. She was advised to call immediately if she has any concerning symptoms in the interval.  Disclaimer: This note was dictated with voice recognition software. Similar sounding words can inadvertently be transcribed and may be missed upon review. Eilleen Kempf., MD 07/31/2014

## 2014-07-27 NOTE — Progress Notes (Signed)
Per Dr Julien Nordmann it is okay to treat pt today with Velcade and today's CMET

## 2014-07-27 NOTE — Patient Instructions (Signed)
[facility] Discharge Instructions for Patients Receiving Chemotherapy  Today you received the following chemotherapy agents: Velcade  To help prevent nausea and vomiting after your treatment, we encourage you to take your nausea medication as prescribed by your physician.   If you develop nausea and vomiting that is not controlled by your nausea medication, call the clinic.   BELOW ARE SYMPTOMS THAT SHOULD BE REPORTED IMMEDIATELY:  *FEVER GREATER THAN 100.5 F  *CHILLS WITH OR WITHOUT FEVER  NAUSEA AND VOMITING THAT IS NOT CONTROLLED WITH YOUR NAUSEA MEDICATION  *UNUSUAL SHORTNESS OF BREATH  *UNUSUAL BRUISING OR BLEEDING  TENDERNESS IN MOUTH AND THROAT WITH OR WITHOUT PRESENCE OF ULCERS  *URINARY PROBLEMS  *BOWEL PROBLEMS  UNUSUAL RASH Items with * indicate a potential emergency and should be followed up as soon as possible.  Feel free to call the clinic you have any questions or concerns. The clinic phone number is [phone].

## 2014-07-27 NOTE — Telephone Encounter (Signed)
Pt confirmed labs/ov per 02/23 POF, gave pt AVS... KJ  °

## 2014-07-30 NOTE — Patient Instructions (Signed)
Your protein studies showed some improvement in your disease. We will give you a break for the next 2 months.  Follow up in 2 months with another set of protein studies to reevaluate your disease

## 2014-08-03 ENCOUNTER — Other Ambulatory Visit: Payer: Medicare Other

## 2014-08-03 ENCOUNTER — Ambulatory Visit: Payer: 59

## 2014-09-20 ENCOUNTER — Other Ambulatory Visit: Payer: Self-pay

## 2014-09-20 DIAGNOSIS — T82511D Breakdown (mechanical) of surgically created arteriovenous shunt, subsequent encounter: Secondary | ICD-10-CM

## 2014-09-21 ENCOUNTER — Other Ambulatory Visit (HOSPITAL_BASED_OUTPATIENT_CLINIC_OR_DEPARTMENT_OTHER): Payer: Medicare Other

## 2014-09-21 DIAGNOSIS — C9000 Multiple myeloma not having achieved remission: Secondary | ICD-10-CM | POA: Diagnosis not present

## 2014-09-21 LAB — CBC WITH DIFFERENTIAL/PLATELET
BASO%: 0.4 % (ref 0.0–2.0)
Basophils Absolute: 0 10*3/uL (ref 0.0–0.1)
EOS%: 1.2 % (ref 0.0–7.0)
Eosinophils Absolute: 0.1 10*3/uL (ref 0.0–0.5)
HCT: 30.8 % — ABNORMAL LOW (ref 34.8–46.6)
HGB: 10.2 g/dL — ABNORMAL LOW (ref 11.6–15.9)
LYMPH%: 15.7 % (ref 14.0–49.7)
MCH: 31.1 pg (ref 25.1–34.0)
MCHC: 33.1 g/dL (ref 31.5–36.0)
MCV: 93.9 fL (ref 79.5–101.0)
MONO#: 0.6 10*3/uL (ref 0.1–0.9)
MONO%: 7.2 % (ref 0.0–14.0)
NEUT%: 75.5 % (ref 38.4–76.8)
NEUTROS ABS: 6.2 10*3/uL (ref 1.5–6.5)
PLATELETS: 173 10*3/uL (ref 145–400)
RBC: 3.28 10*6/uL — ABNORMAL LOW (ref 3.70–5.45)
RDW: 15.4 % — AB (ref 11.2–14.5)
WBC: 8.2 10*3/uL (ref 3.9–10.3)
lymph#: 1.3 10*3/uL (ref 0.9–3.3)

## 2014-09-21 LAB — COMPREHENSIVE METABOLIC PANEL (CC13)
ALT: 11 U/L (ref 0–55)
AST: 13 U/L (ref 5–34)
Albumin: 3.2 g/dL — ABNORMAL LOW (ref 3.5–5.0)
Alkaline Phosphatase: 75 U/L (ref 40–150)
Anion Gap: 16 mEq/L — ABNORMAL HIGH (ref 3–11)
BUN: 23.7 mg/dL (ref 7.0–26.0)
CO2: 24 mEq/L (ref 22–29)
Calcium: 9.6 mg/dL (ref 8.4–10.4)
Chloride: 97 mEq/L — ABNORMAL LOW (ref 98–109)
Creatinine: 4.9 mg/dL (ref 0.6–1.1)
EGFR: 9 mL/min/{1.73_m2} — ABNORMAL LOW (ref >90)
Glucose: 266 mg/dl — ABNORMAL HIGH (ref 70–140)
Potassium: 4.5 mEq/L (ref 3.5–5.1)
Sodium: 137 mEq/L (ref 136–145)
Total Bilirubin: 0.52 mg/dL (ref 0.20–1.20)
Total Protein: 7.2 g/dL (ref 6.4–8.3)

## 2014-09-21 LAB — LACTATE DEHYDROGENASE (CC13): LDH: 188 U/L (ref 125–245)

## 2014-09-23 LAB — KAPPA/LAMBDA LIGHT CHAINS
KAPPA LAMBDA RATIO: 0.81 (ref 0.26–1.65)
Kappa free light chain: 11.3 mg/dL — ABNORMAL HIGH (ref 0.33–1.94)
Lambda Free Lght Chn: 14 mg/dL — ABNORMAL HIGH (ref 0.57–2.63)

## 2014-09-23 LAB — BETA 2 MICROGLOBULIN, SERUM: Beta-2 Microglobulin: 21.1 mg/L — ABNORMAL HIGH (ref <=2.51)

## 2014-09-23 LAB — IGG, IGA, IGM
IGA: 161 mg/dL (ref 69–380)
IGM, SERUM: 104 mg/dL (ref 52–322)
IgG (Immunoglobin G), Serum: 1220 mg/dL (ref 690–1700)

## 2014-09-27 ENCOUNTER — Encounter: Payer: Self-pay | Admitting: Vascular Surgery

## 2014-09-28 ENCOUNTER — Ambulatory Visit (HOSPITAL_BASED_OUTPATIENT_CLINIC_OR_DEPARTMENT_OTHER): Payer: Medicare Other | Admitting: Internal Medicine

## 2014-09-28 ENCOUNTER — Encounter: Payer: Self-pay | Admitting: Vascular Surgery

## 2014-09-28 ENCOUNTER — Ambulatory Visit (INDEPENDENT_AMBULATORY_CARE_PROVIDER_SITE_OTHER)
Admission: RE | Admit: 2014-09-28 | Discharge: 2014-09-28 | Disposition: A | Discharge status: Home / Self Care | Payer: Medicare Other | Source: Ambulatory Visit | Attending: Vascular Surgery | Admitting: Vascular Surgery

## 2014-09-28 ENCOUNTER — Ambulatory Visit (HOSPITAL_COMMUNITY)
Admission: RE | Admit: 2014-09-28 | Discharge: 2014-09-28 | Disposition: A | Discharge status: Home / Self Care | Payer: Medicare Other | Source: Ambulatory Visit | Attending: Vascular Surgery | Admitting: Vascular Surgery

## 2014-09-28 ENCOUNTER — Encounter: Payer: Self-pay | Admitting: Internal Medicine

## 2014-09-28 ENCOUNTER — Ambulatory Visit (INDEPENDENT_AMBULATORY_CARE_PROVIDER_SITE_OTHER): Payer: Medicare Other | Admitting: Vascular Surgery

## 2014-09-28 ENCOUNTER — Telehealth: Payer: Self-pay | Admitting: Internal Medicine

## 2014-09-28 VITALS — BP 126/64 | HR 68 | Resp 18 | Ht 65.5 in | Wt 208.0 lb

## 2014-09-28 VITALS — BP 123/54 | HR 79 | Temp 97.9°F | Resp 18 | Ht 65.0 in | Wt 208.9 lb

## 2014-09-28 DIAGNOSIS — N186 End stage renal disease: Secondary | ICD-10-CM | POA: Diagnosis present

## 2014-09-28 DIAGNOSIS — D631 Anemia in chronic kidney disease: Secondary | ICD-10-CM | POA: Diagnosis not present

## 2014-09-28 DIAGNOSIS — T82511D Breakdown (mechanical) of surgically created arteriovenous shunt, subsequent encounter: Secondary | ICD-10-CM

## 2014-09-28 DIAGNOSIS — N189 Chronic kidney disease, unspecified: Secondary | ICD-10-CM

## 2014-09-28 DIAGNOSIS — C9000 Multiple myeloma not having achieved remission: Secondary | ICD-10-CM

## 2014-09-28 NOTE — Progress Notes (Signed)
Patient presents today for discussion of AV access for hemodialysis. She reports that she had a forearm loop graft placed years ago and never function. She had a left upper arm graft placed by Dr. Kellie Simmering and December 2014. She is use this graft since that time. She has had multiple recent failures. This was treated at the outpatient access Center. I have her most recent report of the most recent declot on 09/15/2014. Reportedly this was occurring very frequently up to once a month. The patient reports she had a grade discomfort associated with procedure the last treatment and was not extubated in repeating this. She is seeing me today for discussion of access options. I do not see any mention of a stent placed at the most recent procedure but the patient says that there was a stent placed on a prior procedure presumably at the venous anastomosis and the axilla.  Past Medical History  Diagnosis Date  . Hypertension   . Asthma   . GERD (gastroesophageal reflux disease)   . Neuropathy in diabetes     Hx: of  . COPD (chronic obstructive pulmonary disease)   . Anemia   . High cholesterol   . Pulmonary embolism 1987  . Type II diabetes mellitus   . History of blood transfusion 1987; 2011    "related to lung OR; HgB went down to 5"  . Arthritis     "hips, knees, shoulders" (11/10/2013)  . Chronic lower back pain   . Gout   . ESRD (end stage renal disease) on dialysis     "1st treatment 11/09/2013"  . Kidney stones   . Multiple myeloma     History  Substance Use Topics  . Smoking status: Never Smoker   . Smokeless tobacco: Never Used  . Alcohol Use: No    Family History  Problem Relation Age of Onset  . Blindness Father   . Diabetes Father   . Diabetes Sister   . Kidney failure Brother     Allergies  Allergen Reactions  . Penicillins Cross Reactors Other (See Comments)    Tongue Swelling     Current outpatient prescriptions:  .  acyclovir (ZOVIRAX) 400 MG tablet, TAKE 1 TABLET BY  MOUTH DAILY, Disp: 30 tablet, Rfl: 1 .  albuterol (PROVENTIL HFA;VENTOLIN HFA) 108 (90 BASE) MCG/ACT inhaler, Inhale 1 puff into the lungs 2 (two) times daily as needed for wheezing or shortness of breath., Disp: , Rfl:  .  allopurinol (ZYLOPRIM) 100 MG tablet, Take 100 mg by mouth daily with breakfast. , Disp: , Rfl:  .  amLODipine (NORVASC) 5 MG tablet, Take 5 mg by mouth daily with breakfast. , Disp: , Rfl:  .  B-D ULTRAFINE III SHORT PEN 31G X 8 MM MISC, , Disp: , Rfl: 4 .  BESIVANCE 0.6 % SUSP, , Disp: , Rfl: 1 .  cyclobenzaprine (FLEXERIL) 5 MG tablet, , Disp: , Rfl: 0 .  dexamethasone (DECADRON) 4 MG tablet, Take 10 tablets (40 mg total) by mouth once a week., Disp: 40 tablet, Rfl: 1 .  DUREZOL 0.05 % EMUL, , Disp: , Rfl: 1 .  gabapentin (NEURONTIN) 100 MG capsule, Take 100 mg by mouth 3 (three) times daily., Disp: , Rfl:  .  glimepiride (AMARYL) 1 MG tablet, Take 1 mg by mouth daily with breakfast. , Disp: , Rfl:  .  HYDROcodone-acetaminophen (NORCO) 7.5-325 MG per tablet, Take 1 tablet by mouth every 6 (six) hours as needed for moderate pain., Disp: , Rfl:  .  hydrOXYzine (ATARAX/VISTARIL) 25 MG tablet, Take 25 mg by mouth every 12 (twelve) hours as needed., Disp: , Rfl: 2 .  ILEVRO 0.3 % ophthalmic suspension, , Disp: , Rfl: 1 .  LEVEMIR FLEXTOUCH 100 UNIT/ML Pen, , Disp: , Rfl: 5 .  levothyroxine (SYNTHROID, LEVOTHROID) 100 MCG tablet, Take 100 mcg by mouth daily., Disp: , Rfl: 4 .  lidocaine-prilocaine (EMLA) cream, , Disp: , Rfl:  .  Multiple Vitamins-Minerals (MULTIVITAMIN WITH MINERALS) tablet, Take 2 tablets by mouth daily., Disp: , Rfl:  .  multivitamin (RENA-VIT) TABS tablet, Take 1 tablet by mouth daily., Disp: , Rfl:  .  ondansetron (ZOFRAN) 8 MG tablet, Take 1 tablet (8 mg total) by mouth every 8 (eight) hours as needed for nausea or vomiting., Disp: 20 tablet, Rfl: 1 .  ONE TOUCH ULTRA TEST test strip, , Disp: , Rfl:  .  Oxycodone HCl 20 MG TABS, Take 20 mg by mouth every  12 (twelve) hours. , Disp: , Rfl:  .  pantoprazole (PROTONIX) 40 MG tablet, Take 40 mg by mouth 2 (two) times daily. , Disp: , Rfl:  .  saxagliptin HCl (ONGLYZA) 2.5 MG TABS tablet, Take 2.5 mg by mouth daily with breakfast. , Disp: , Rfl:   Filed Vitals:   09/28/14 1401  BP: 126/64  Pulse: 68  Resp: 18  Height: 5' 5.5" (1.664 m)  Weight: 208 lb (94.348 kg)    Body mass index is 34.07 kg/(m^2).       On physical exam: She does have a patent upper arm graft with an excellent thrill. She does have a palpable radial pulses bilaterally. No evidence of skin breakdown over the graft.  Venous duplex today reveals extremely small basilic and cephalic vein in her right arm. I imaged disease myself with SonoSite ultrasound of her veins are very small and not adequate for fistula attempt.  Discussed options with the patient and her daughter present. She is extremely frustrated regarding her access issues. Explained that I do not see any option for fistula. Explain only option would be to offer a right upper arm graft. She may have a similar results with her left arm graft. Also explain the option of allowing the left arm graft failed and then proceeding with surgical thrombectomy and venous revision at that time to see if she could have a better outcome. With the history of a prior stent, it is possible that this is not possible. I will obtain the actual films of the most recent procedure from the nephrologist. Unfortunately no way of viewing these. Will discuss with the patient after I have had a chance to review these films

## 2014-09-28 NOTE — Telephone Encounter (Signed)
Gave avs & calendar for June. No orders for Imaging. Sent MD to verify if patient will need imaging.

## 2014-09-28 NOTE — Progress Notes (Signed)
[facility] [phone]   Fax:(336) [phone]  OFFICE PROGRESS NOTE  [name], MD [place] [address]  DIAGNOSIS:  #1 plasma cell dyscrasia, multiple myeloma.  #2 anemia of chronic disease secondary to chronic kidney disease.   PRIOR THERAPY: Treatment with single agent subcutaneous Velcade 1.3 mg/m2 on a weekly basis with Decadron 40 mg by mouth weekly, status post 34 cycles. Last dose was given on 07/27/2014.   CURRENT THERAPY: Observation.  INTERVAL HISTORY: [name] 81 y.o. female returns to the clinic today for followup visit accompanied by her daughter. The patient has been observation for the last 2 months. She is feeling fine today was no specific complaints except for trouble with her AV fistula of the left arm for the dialysis. She is followed by vascular surgery for this problem. The patient is currently on hemodialysis on Monday, Wednesday and Friday. She denied having any fever or chills, no nausea or vomiting. The patient denied having any significant chest pain, shortness of breath, cough or hemoptysis. She has no significant weight loss or night sweats. She had repeat myeloma panel performed recently and she is here for evaluation and discussion of her lab results.  MEDICAL HISTORY: Past Medical History  Diagnosis Date  . Hypertension   . Asthma   . GERD (gastroesophageal reflux disease)   . Neuropathy in diabetes     Hx: of  . COPD (chronic obstructive pulmonary disease)   . Anemia   . High cholesterol   . Pulmonary embolism 1987  . Type II diabetes mellitus   . History of blood transfusion 1987; 2011    "related to lung OR; HgB went down to 5"  . Arthritis     "hips, knees, shoulders" (11/10/2013)  . Chronic lower back pain   . Gout   . ESRD (end stage renal disease) on dialysis     "1st treatment 11/09/2013"  . Kidney stones   . Multiple myeloma     ALLERGIES:  is allergic to  penicillins cross reactors.  MEDICATIONS:  Current Outpatient Prescriptions  Medication Sig Dispense Refill  . acyclovir (ZOVIRAX) 400 MG tablet TAKE 1 TABLET BY MOUTH DAILY 30 tablet 1  . albuterol (PROVENTIL HFA;VENTOLIN HFA) 108 (90 BASE) MCG/ACT inhaler Inhale 1 puff into the lungs 2 (two) times daily as needed for wheezing or shortness of breath.    . allopurinol (ZYLOPRIM) 100 MG tablet Take 100 mg by mouth daily with breakfast.     . amLODipine (NORVASC) 5 MG tablet Take 5 mg by mouth daily with breakfast.     . B-D ULTRAFINE III SHORT PEN 31G X 8 MM MISC   4  . BESIVANCE 0.6 % SUSP   1  . cyclobenzaprine (FLEXERIL) 5 MG tablet   0  . DUREZOL 0.05 % EMUL   1  . gabapentin (NEURONTIN) 100 MG capsule Take 100 mg by mouth 3 (three) times daily.    Marland Kitchen glimepiride (AMARYL) 1 MG tablet Take 1 mg by mouth daily with breakfast.     . HYDROcodone-acetaminophen (NORCO) 7.5-325 MG per tablet Take 1 tablet by mouth every 6 (six) hours as needed for moderate pain.    . hydrOXYzine (ATARAX/VISTARIL) 25 MG tablet Take 25 mg by mouth every 12 (twelve) hours as needed.  2  . ILEVRO 0.3 % ophthalmic suspension   1  . LEVEMIR FLEXTOUCH 100 UNIT/ML Pen   5  . levothyroxine (SYNTHROID,  LEVOTHROID) 100 MCG tablet Take 100 mcg by mouth daily.  4  . lidocaine-prilocaine (EMLA) cream     . Multiple Vitamins-Minerals (MULTIVITAMIN WITH MINERALS) tablet Take 2 tablets by mouth daily.    . multivitamin (RENA-VIT) TABS tablet Take 1 tablet by mouth daily.    . ONE TOUCH ULTRA TEST test strip     . Oxycodone HCl 20 MG TABS Take 20 mg by mouth every 12 (twelve) hours.     . pantoprazole (PROTONIX) 40 MG tablet Take 40 mg by mouth 2 (two) times daily.     . saxagliptin HCl (ONGLYZA) 2.5 MG TABS tablet Take 2.5 mg by mouth daily with breakfast.     . dexamethasone (DECADRON) 4 MG tablet Take 10 tablets (40 mg total) by mouth once a week. (Patient not taking: Reported on 09/28/2014) 40 tablet 1  . ondansetron  (ZOFRAN) 8 MG tablet Take 1 tablet (8 mg total) by mouth every 8 (eight) hours as needed for nausea or vomiting. (Patient not taking: Reported on 09/28/2014) 20 tablet 1   No current facility-administered medications for this visit.    SURGICAL HISTORY:  Past Surgical History  Procedure Laterality Date  . Lung removal, partial Left 1987  . Cholecystectomy    . Av fistula placement Left 2011    "lower arm; never used"  . Colonoscopy    . Abdominoplasty  ~ 1983    "tummy tuck"  . Abdominal hysterectomy  1964  . Av fistula placement Left 05/15/2013    Procedure: INSERTION OF ARTERIOVENOUS (AV) GORE-TEX GRAFT ARM-LEFT UPPER ARM;  Surgeon: Mal Misty, MD;  Location: [facility];  Service: Vascular;  Laterality: Left;  . Appendectomy  1954  . Umbilical hernia repair  ~ 1983  . Hernia repair    . Tubal ligation  1964  . Cystoscopy w/ stone manipulation  1980's    REVIEW OF SYSTEMS:  Constitutional: positive for fatigue Eyes: negative Ears, nose, mouth, throat, and face: negative Respiratory: negative Cardiovascular: negative Gastrointestinal: negative Genitourinary:negative Integument/breast: negative Hematologic/lymphatic: negative Musculoskeletal:negative Neurological: negative Behavioral/Psych: negative Endocrine: negative Allergic/Immunologic: negative   PHYSICAL EXAMINATION: General appearance: alert, cooperative, fatigued and no distress Head: Normocephalic, without obvious abnormality, atraumatic Neck: no adenopathy Lymph nodes: Cervical, supraclavicular, and axillary nodes normal. Resp: clear to auscultation bilaterally Back: symmetric, no curvature. ROM normal. No CVA tenderness. Cardio: regular rate and rhythm, S1, S2 normal, no murmur, click, rub or gallop GI: soft, non-tender; bowel sounds normal; no masses,  no organomegaly Extremities: extremities normal, atraumatic, no cyanosis or edema Neurologic: Alert and oriented X 3, normal strength and tone. Normal  symmetric reflexes. Normal coordination and gait  ECOG PERFORMANCE STATUS: 2 - Symptomatic, <50% confined to bed  Blood pressure 123/54, pulse 79, temperature 97.9 F (36.6 C), temperature source Oral, resp. rate 18, height $RemoveBe'5\' 5"'[id number]$  (1.651 m), weight 208 lb 14.4 oz (94.756 kg), SpO2 100 %.  LABORATORY DATA: Lab Results  Component Value Date   WBC 8.2 09/21/2014   HGB 10.2* 09/21/2014   HCT 30.8* 09/21/2014   MCV 93.9 09/21/2014   PLT 173 09/21/2014      Chemistry      Component Value Date/Time   NA 137 09/21/2014 0913   NA 143 11/10/2013 0748   K 4.5 09/21/2014 0913   K 3.4* 11/10/2013 0748   CL 98 11/10/2013 0748   CL 98 09/17/2012 1242   CO2 24 09/21/2014 0913   CO2 30 11/10/2013 0748   BUN 23.7 09/21/2014 0913  BUN 80* 11/10/2013 0748   CREATININE 4.9* 09/21/2014 0913   CREATININE 3.66* 11/10/2013 0748      Component Value Date/Time   CALCIUM 9.6 09/21/2014 0913   CALCIUM 9.5 11/10/2013 0748   CALCIUM 8.6 12/02/2012 0933   ALKPHOS 75 09/21/2014 0913   ALKPHOS 81 03/26/2012 1643   AST 13 09/21/2014 0913   AST 23 03/26/2012 1643   ALT 11 09/21/2014 0913   ALT 14 03/26/2012 1643   BILITOT 0.52 09/21/2014 0913   BILITOT 0.2* 03/26/2012 1643     Other lab results: Beta-2 microglobulin 21.10. Free kappa light chain 11.30, free lambda light chain 14.00, kappa/lambda ratio 0.81. IgG 1220, IgA 161 and IgM 104.  ASSESSMENT: This is a very pleasant 81 years old African American female with plasma cell dyscrasia completed treatment with weekly subcutaneous Velcade and Decadron status post 34 weekly doses and tolerating it fairly well  The patient has been observation for the last 2 months and feeling fine. Her myeloma panel showed mild increase in the free kappa and lambda light chain as well as a beta-2 microglobulin. I discussed the lab result with the patient and her daughter today. I gave her the option of resuming her systemic treatment with subcutaneous Velcade and  Decadron versus continuous observation and close monitoring since she is currently asymptomatic. The patient prefer to continue on observation for now. She will come back for follow-up visit in 2 months after repeating myeloma panel for reevaluation of her disease. She was advised to call immediately if she has any concerning symptoms in the interval.  Disclaimer: This note was dictated with voice recognition software. Similar sounding words can inadvertently be transcribed and may not be corrected upon review.

## 2014-09-30 ENCOUNTER — Encounter: Payer: Self-pay | Admitting: Nephrology

## 2014-09-30 ENCOUNTER — Encounter: Payer: Self-pay | Admitting: Vascular Surgery

## 2014-09-30 NOTE — Progress Notes (Signed)
Patient ID: [name], female   DOB: Mar 31, 1933, 81 y.o.   MRN: [id number] I reviewed the patient's shuntogram and thrombectomy from 09/15/2014 at the outpatient access center. This does show a axillary stent in place which make it impossible to surgically revise above this. Only surgical option in my opinion is new right upper arm AV graft. We'll communicate this with the patient.

## 2014-10-07 ENCOUNTER — Other Ambulatory Visit: Payer: Self-pay

## 2014-10-18 ENCOUNTER — Encounter (HOSPITAL_COMMUNITY): Payer: Self-pay | Admitting: *Deleted

## 2014-10-18 MED ORDER — SODIUM CHLORIDE 0.9 % IV SOLN
INTRAVENOUS | Status: DC
  Administered 2014-10-19: 10:00:00 via INTRAVENOUS

## 2014-10-19 ENCOUNTER — Ambulatory Visit (HOSPITAL_COMMUNITY)
Admission: RE | Admit: 2014-10-19 | Discharge: 2014-10-19 | Disposition: A | Discharge status: Home / Self Care | Payer: Medicare Other | Source: Ambulatory Visit | Attending: Vascular Surgery | Admitting: Vascular Surgery

## 2014-10-19 ENCOUNTER — Ambulatory Visit (HOSPITAL_COMMUNITY): Payer: Medicare Other | Admitting: Anesthesiology

## 2014-10-19 ENCOUNTER — Encounter (HOSPITAL_COMMUNITY)
Admission: RE | Disposition: A | Discharge status: Home / Self Care | Payer: Self-pay | Source: Ambulatory Visit | Attending: Vascular Surgery

## 2014-10-19 ENCOUNTER — Encounter (HOSPITAL_COMMUNITY): Payer: Self-pay | Admitting: *Deleted

## 2014-10-19 DIAGNOSIS — N185 Chronic kidney disease, stage 5: Secondary | ICD-10-CM | POA: Diagnosis not present

## 2014-10-19 DIAGNOSIS — E119 Type 2 diabetes mellitus without complications: Secondary | ICD-10-CM | POA: Insufficient documentation

## 2014-10-19 DIAGNOSIS — I120 Hypertensive chronic kidney disease with stage 5 chronic kidney disease or end stage renal disease: Secondary | ICD-10-CM | POA: Diagnosis not present

## 2014-10-19 DIAGNOSIS — J45909 Unspecified asthma, uncomplicated: Secondary | ICD-10-CM | POA: Diagnosis not present

## 2014-10-19 DIAGNOSIS — K219 Gastro-esophageal reflux disease without esophagitis: Secondary | ICD-10-CM | POA: Diagnosis not present

## 2014-10-19 DIAGNOSIS — E039 Hypothyroidism, unspecified: Secondary | ICD-10-CM | POA: Insufficient documentation

## 2014-10-19 DIAGNOSIS — N186 End stage renal disease: Secondary | ICD-10-CM | POA: Insufficient documentation

## 2014-10-19 DIAGNOSIS — M1990 Unspecified osteoarthritis, unspecified site: Secondary | ICD-10-CM | POA: Insufficient documentation

## 2014-10-19 HISTORY — DX: Hypothyroidism, unspecified: E03.9

## 2014-10-19 HISTORY — DX: Polyneuropathy, unspecified: G62.9

## 2014-10-19 HISTORY — PX: AV FISTULA PLACEMENT: SHX1204

## 2014-10-19 LAB — GLUCOSE, CAPILLARY
Glucose-Capillary: 103 mg/dL — ABNORMAL HIGH (ref 65–99)
Glucose-Capillary: 120 mg/dL — ABNORMAL HIGH (ref 65–99)

## 2014-10-19 LAB — POCT I-STAT 4, (NA,K, GLUC, HGB,HCT)
Glucose, Bld: 113 mg/dL — ABNORMAL HIGH (ref 65–99)
HEMATOCRIT: 32 % — AB (ref 36.0–46.0)
Hemoglobin: 10.9 g/dL — ABNORMAL LOW (ref 12.0–15.0)
POTASSIUM: 5.1 mmol/L (ref 3.5–5.1)
Sodium: 134 mmol/L — ABNORMAL LOW (ref 135–145)

## 2014-10-19 SURGERY — INSERTION OF ARTERIOVENOUS (AV) GORE-TEX GRAFT ARM
Anesthesia: Monitor Anesthesia Care | Site: Arm Lower | Laterality: Right

## 2014-10-19 MED ORDER — FENTANYL CITRATE (PF) 100 MCG/2ML IJ SOLN
INTRAMUSCULAR | Status: DC | PRN
  Administered 2014-10-19 (×2): 50 ug via INTRAVENOUS

## 2014-10-19 MED ORDER — 0.9 % SODIUM CHLORIDE (POUR BTL) OPTIME
TOPICAL | Status: DC | PRN
  Administered 2014-10-19: 1000 mL

## 2014-10-19 MED ORDER — HEPARIN SODIUM (PORCINE) 1000 UNIT/ML IJ SOLN
INTRAMUSCULAR | Status: AC
  Filled 2014-10-19: qty 1

## 2014-10-19 MED ORDER — CHLORHEXIDINE GLUCONATE CLOTH 2 % EX PADS: 6.0000 | MEDICATED_PAD | Freq: Once | CUTANEOUS | Status: DC

## 2014-10-19 MED ORDER — LIDOCAINE HCL (CARDIAC) 20 MG/ML IV SOLN
INTRAVENOUS | Status: AC
  Filled 2014-10-19: qty 5

## 2014-10-19 MED ORDER — PROMETHAZINE HCL 25 MG/ML IJ SOLN
6.2500 mg | INTRAMUSCULAR | Status: DC | PRN
Start: 2014-10-19 — End: 2014-10-19

## 2014-10-19 MED ORDER — OXYCODONE-ACETAMINOPHEN 5-325 MG PO TABS: 1.0000 | ORAL_TABLET | ORAL | Status: DC | PRN

## 2014-10-19 MED ORDER — PROPOFOL 10 MG/ML IV BOLUS
INTRAVENOUS | Status: DC | PRN
  Administered 2014-10-19 (×2): 100 mg via INTRAVENOUS

## 2014-10-19 MED ORDER — ONDANSETRON HCL 4 MG/2ML IJ SOLN
INTRAMUSCULAR | Status: AC
  Filled 2014-10-19: qty 2

## 2014-10-19 MED ORDER — HYDROMORPHONE HCL 1 MG/ML IJ SOLN
INTRAMUSCULAR | Status: DC
Start: 2014-10-19 — End: 2014-10-19
  Filled 2014-10-19: qty 1

## 2014-10-19 MED ORDER — LIDOCAINE HCL (CARDIAC) 20 MG/ML IV SOLN
INTRAVENOUS | Status: DC | PRN
  Administered 2014-10-19 (×2): 50 mg via INTRAVENOUS

## 2014-10-19 MED ORDER — SODIUM CHLORIDE 0.9 % IV SOLN
INTRAVENOUS | Status: DC
  Administered 2014-10-19: 09:00:00 via INTRAVENOUS

## 2014-10-19 MED ORDER — FENTANYL CITRATE (PF) 250 MCG/5ML IJ SOLN
INTRAMUSCULAR | Status: AC
  Filled 2014-10-19: qty 5

## 2014-10-19 MED ORDER — CEFAZOLIN SODIUM-DEXTROSE 2-3 GM-% IV SOLR
INTRAVENOUS | Status: AC
  Filled 2014-10-19: qty 50

## 2014-10-19 MED ORDER — PHENYLEPHRINE 40 MCG/ML (10ML) SYRINGE FOR IV PUSH (FOR BLOOD PRESSURE SUPPORT)
PREFILLED_SYRINGE | INTRAVENOUS | Status: AC
  Filled 2014-10-19: qty 10

## 2014-10-19 MED ORDER — PROTAMINE SULFATE 10 MG/ML IV SOLN
INTRAVENOUS | Status: AC
  Filled 2014-10-19: qty 5

## 2014-10-19 MED ORDER — MIDAZOLAM HCL 2 MG/2ML IJ SOLN
INTRAMUSCULAR | Status: AC
  Filled 2014-10-19: qty 2

## 2014-10-19 MED ORDER — HYDROMORPHONE HCL 1 MG/ML IJ SOLN
0.2500 mg | INTRAMUSCULAR | Status: DC | PRN
  Administered 2014-10-19 (×2): 0.5 mg via INTRAVENOUS

## 2014-10-19 MED ORDER — SODIUM CHLORIDE 0.9 % IJ SOLN
INTRAMUSCULAR | Status: AC
  Filled 2014-10-19: qty 10

## 2014-10-19 MED ORDER — EPHEDRINE SULFATE 50 MG/ML IJ SOLN
INTRAMUSCULAR | Status: DC | PRN
  Administered 2014-10-19: 10 mg via INTRAVENOUS
  Administered 2014-10-19: 5 mg via INTRAVENOUS

## 2014-10-19 MED ORDER — SODIUM CHLORIDE 0.9 % IR SOLN
Status: DC | PRN
  Administered 2014-10-19: 11:00:00

## 2014-10-19 MED ORDER — LIDOCAINE HCL (PF) 1 % IJ SOLN
INTRAMUSCULAR | Status: AC
  Filled 2014-10-19: qty 30

## 2014-10-19 MED ORDER — THROMBIN 20000 UNITS EX SOLR
CUTANEOUS | Status: AC
  Filled 2014-10-19: qty 20000

## 2014-10-19 MED ORDER — CEFAZOLIN SODIUM-DEXTROSE 2-3 GM-% IV SOLR
2.0000 g | Freq: Once | INTRAVENOUS | Status: AC
  Administered 2014-10-19: 2 g via INTRAVENOUS

## 2014-10-19 MED ORDER — EPHEDRINE SULFATE 50 MG/ML IJ SOLN
INTRAMUSCULAR | Status: AC
  Filled 2014-10-19: qty 1

## 2014-10-19 MED ORDER — PROTAMINE SULFATE 10 MG/ML IV SOLN
INTRAVENOUS | Status: DC | PRN
  Administered 2014-10-19: 40 mg via INTRAVENOUS

## 2014-10-19 MED ORDER — ONDANSETRON HCL 4 MG/2ML IJ SOLN
INTRAMUSCULAR | Status: DC | PRN
  Administered 2014-10-19: 4 mg via INTRAVENOUS

## 2014-10-19 MED ORDER — HEPARIN SODIUM (PORCINE) 1000 UNIT/ML IJ SOLN
INTRAMUSCULAR | Status: DC | PRN
  Administered 2014-10-19: 7000 [IU] via INTRAVENOUS

## 2014-10-19 SURGICAL SUPPLY — 32 items
ARMBAND PINK RESTRICT EXTREMIT (MISCELLANEOUS) ×3 IMPLANT
CANISTER SUCTION 2500CC (MISCELLANEOUS) ×3 IMPLANT
CANNULA VESSEL 3MM 2 BLNT TIP (CANNULA) ×2 IMPLANT
CLIP TI MEDIUM 6 (CLIP) ×3 IMPLANT
CLIP TI WIDE RED SMALL 6 (CLIP) ×3 IMPLANT
DECANTER SPIKE VIAL GLASS SM (MISCELLANEOUS) ×3 IMPLANT
ELECT REM PT RETURN 9FT ADLT (ELECTROSURGICAL) ×3
ELECTRODE REM PT RTRN 9FT ADLT (ELECTROSURGICAL) ×1 IMPLANT
GLOVE BIO SURGEON STRL SZ7.5 (GLOVE) ×3 IMPLANT
GLOVE BIOGEL PI IND STRL 6.5 (GLOVE) IMPLANT
GLOVE BIOGEL PI IND STRL 7.0 (GLOVE) IMPLANT
GLOVE BIOGEL PI IND STRL 8 (GLOVE) ×1 IMPLANT
GLOVE BIOGEL PI INDICATOR 6.5 (GLOVE) ×6
GLOVE BIOGEL PI INDICATOR 7.0 (GLOVE) ×2
GLOVE BIOGEL PI INDICATOR 8 (GLOVE) ×2
GLOVE ECLIPSE 6.5 STRL STRAW (GLOVE) ×4 IMPLANT
GOWN STRL REUS W/ TWL LRG LVL3 (GOWN DISPOSABLE) ×3 IMPLANT
GOWN STRL REUS W/TWL LRG LVL3 (GOWN DISPOSABLE) ×9
GRAFT GORETEX STRT 4-7X45 (Vascular Products) ×2 IMPLANT
KIT BASIN OR (CUSTOM PROCEDURE TRAY) ×3 IMPLANT
KIT ROOM TURNOVER OR (KITS) ×3 IMPLANT
LIQUID BAND (GAUZE/BANDAGES/DRESSINGS) ×3 IMPLANT
NS IRRIG 1000ML POUR BTL (IV SOLUTION) ×3 IMPLANT
PACK CV ACCESS (CUSTOM PROCEDURE TRAY) ×3 IMPLANT
PAD ARMBOARD 7.5X6 YLW CONV (MISCELLANEOUS) ×6 IMPLANT
SPONGE SURGIFOAM ABS GEL 100 (HEMOSTASIS) IMPLANT
SUT PROLENE 6 0 BV (SUTURE) ×8 IMPLANT
SUT VIC AB 3-0 SH 27 (SUTURE) ×6
SUT VIC AB 3-0 SH 27X BRD (SUTURE) ×2 IMPLANT
SUT VICRYL 4-0 PS2 18IN ABS (SUTURE) ×8 IMPLANT
UNDERPAD 30X30 INCONTINENT (UNDERPADS AND DIAPERS) ×3 IMPLANT
WATER STERILE IRR 1000ML POUR (IV SOLUTION) ×3 IMPLANT

## 2014-10-19 NOTE — Anesthesia Postprocedure Evaluation (Signed)
  Anesthesia Post-op Note  Patient: [name]  Procedure(s) Performed: Procedure(s): INSERTION OF ARTERIOVENOUS (AV) 4-15mm x 45cm GORE-TEX GRAFT RIGHT FOREARM (Right)  Patient Location: PACU  Anesthesia Type:General  Level of Consciousness: awake and alert   Airway and Oxygen Therapy: Patient Spontanous Breathing  Post-op Pain: mild  Post-op Assessment: Post-op Vital signs reviewed  Post-op Vital Signs: stable  Last Vitals:  Filed Vitals:   10/19/14 1315  BP: 121/33  Pulse: 62  Temp:   Resp: 16    Complications: No apparent anesthesia complications

## 2014-10-19 NOTE — Transfer of Care (Signed)
Immediate Anesthesia Transfer of Care Note  Patient: [name]  Procedure(s) Performed: Procedure(s): INSERTION OF ARTERIOVENOUS (AV) 4-31mm x 45cm GORE-TEX GRAFT RIGHT FOREARM (Right)  Patient Location: PACU  Anesthesia Type:General  Level of Consciousness: awake, oriented and patient cooperative  Airway & Oxygen Therapy: Patient Spontanous Breathing and Patient connected to nasal cannula oxygen  Post-op Assessment: Report given to RN and Post -op Vital signs reviewed and stable  Post vital signs: Reviewed  Last Vitals:  Filed Vitals:   10/19/14 0810  BP: 131/65  Pulse: 66  Temp: 36.1 C  Resp: 18    Complications: No apparent anesthesia complications

## 2014-10-19 NOTE — Op Note (Signed)
    NAME: [name]   MRN: [id number] DOB: 1933/03/08    DATE OF OPERATION: 10/19/2014  PREOP DIAGNOSIS: Stage V chronic kidney disease  POSTOP DIAGNOSIS: Same  PROCEDURE: new right forearm AV graft  SURGEON: Judeth Cornfield. Scot Dock, MD, FACS  ASSIST: Gaye Alken RNFA  ANESTHESIA: Gen.   EBL: minimal  INDICATIONS: [name] is a 81 y.o. female who presents for new access.  FINDINGS: 4.5 mm brachial vein  TECHNIQUE: The patient was taken to the operating room and sedated by anesthesia. She then received a general anesthetic. The right upper extremity was prepped and draped in the usual sterile fashion. A transverse incision was made above the antecubital level and the brachial artery and adjacent brachial vein were dissected free. The vein appeared reasonable in size. Was ligated distally. Irrigated up with heparinized saline and took a 4.5 mm dilator. A 4-7 mm PTFE graft was tunneled in a loop fashion using one distal counterincision. The arterial aspect of the graft was along the radial aspect of the forearm. The patient was heparinized. The brachial artery was clamped proximal and distally and longitudinal arteriotomy was made. A short segment of the 4 mm and the graft was excised, the graft slightly spatulated, and sewn end-to-side to the brachial artery using continuous 6-0 Prolene suture. The venous limb of the graft and cut the appropriate length and spatulated. The brachial vein had been spatulated. The vein was sewn to the graft using 2 continuous 6-0 Prolene sutures. At the completion was an excellent thrill in the graft and a good radial and ulnar signals with Doppler. The heparin was partially reversed with protamine. The wounds are closed deeply with 3-0 Vicryl and the skin closed with 4-0 Vicryl. Liquid band was applied. The patient tolerated the procedure well and transferred to the recovery room in stable condition. All needle and sponge counts were  correct.  [name], MD, FACS [facility]  DATE OF DICTATION:   10/19/2014

## 2014-10-19 NOTE — Anesthesia Preprocedure Evaluation (Signed)
Anesthesia Evaluation  Patient identified by MRN, date of birth, ID band Patient awake    Reviewed: Allergy & Precautions, NPO status   Airway Mallampati: I  TM Distance: >3 FB Neck ROM: Full    Dental  (+) Poor Dentition, Missing   Pulmonary asthma ,  breath sounds clear to auscultation        Cardiovascular hypertension, Rhythm:Regular Rate:Normal     Neuro/Psych negative neurological ROS     GI/Hepatic GERD-  ,  Endo/Other  diabetes, Well ControlledHypothyroidism Morbid obesity  Renal/GU ESRF and DialysisRenal disease     Musculoskeletal  (+) Arthritis -,   Abdominal (+) + obese,   Peds  Hematology Multiple myeloma    Anesthesia Other Findings   Reproductive/Obstetrics                             Anesthesia Physical Anesthesia Plan  ASA: III  Anesthesia Plan: General and MAC   Post-op Pain Management:    Induction: Intravenous  Airway Management Planned: LMA  Additional Equipment:   Intra-op Plan:   Post-operative Plan: Extubation in OR  Informed Consent: I have reviewed the patients History and Physical, chart, labs and discussed the procedure including the risks, benefits and alternatives for the proposed anesthesia with the patient or authorized representative who has indicated his/her understanding and acceptance.   Dental advisory given  Plan Discussed with: CRNA and Surgeon  Anesthesia Plan Comments:         Anesthesia Quick Evaluation

## 2014-10-19 NOTE — Interval H&P Note (Signed)
History and Physical Interval Note:  10/19/2014 10:05 AM  [name]  has presented today for surgery, with the diagnosis of End Stage Renal Disease  N18.6  The various methods of treatment have been discussed with the patient and family. After consideration of risks, benefits and other options for treatment, the patient has consented to  Procedure(s): INSERTION OF ARTERIOVENOUS (AV) GORE-TEX GRAFT RIGHT UPPER ARM (Right) as a surgical intervention .  The patient's history has been reviewed, patient examined, no change in status, stable for surgery.  I have reviewed the patient's chart and labs.  Questions were answered to the patient's satisfaction.     [name]

## 2014-10-19 NOTE — Anesthesia Procedure Notes (Signed)
Procedure Name: LMA Insertion Date/Time: 10/19/2014 10:48 AM Performed by: Luciana Axe K Pre-anesthesia Checklist: Patient identified, Emergency Drugs available, Suction available, Patient being monitored and Timeout performed Patient Re-evaluated:Patient Re-evaluated prior to inductionOxygen Delivery Method: Circle system utilized Preoxygenation: Pre-oxygenation with 100% oxygen Intubation Type: IV induction Ventilation: Mask ventilation without difficulty LMA: LMA inserted LMA Size: 4.0 Number of attempts: 1 Airway Equipment and Method: Stylet Placement Confirmation: positive ETCO2,  CO2 detector and breath sounds checked- equal and bilateral Tube secured with: Tape Dental Injury: Teeth and Oropharynx as per pre-operative assessment

## 2014-10-19 NOTE — H&P (View-Only) (Signed)
Patient ID: [name], female   DOB: 1933/04/12, 81 y.o.   MRN: [id number] I reviewed the patient's shuntogram and thrombectomy from 09/15/2014 at the outpatient access center. This does show a axillary stent in place which make it impossible to surgically revise above this. Only surgical option in my opinion is new right upper arm AV graft. We'll communicate this with the patient.

## 2014-10-20 ENCOUNTER — Encounter (HOSPITAL_COMMUNITY): Payer: Self-pay | Admitting: Vascular Surgery

## 2014-11-23 ENCOUNTER — Ambulatory Visit: Payer: Medicare Other

## 2014-11-23 ENCOUNTER — Other Ambulatory Visit (HOSPITAL_BASED_OUTPATIENT_CLINIC_OR_DEPARTMENT_OTHER): Payer: Medicare Other

## 2014-11-23 DIAGNOSIS — C9000 Multiple myeloma not having achieved remission: Secondary | ICD-10-CM | POA: Diagnosis not present

## 2014-11-23 LAB — CBC WITH DIFFERENTIAL/PLATELET
BASO%: 0.8 % (ref 0.0–2.0)
Basophils Absolute: 0.1 10*3/uL (ref 0.0–0.1)
EOS ABS: 0.1 10*3/uL (ref 0.0–0.5)
EOS%: 1.7 % (ref 0.0–7.0)
HCT: 32.1 % — ABNORMAL LOW (ref 34.8–46.6)
HGB: 10.4 g/dL — ABNORMAL LOW (ref 11.6–15.9)
LYMPH%: 14.2 % (ref 14.0–49.7)
MCH: 30.6 pg (ref 25.1–34.0)
MCHC: 32.4 g/dL (ref 31.5–36.0)
MCV: 94.5 fL (ref 79.5–101.0)
MONO#: 0.8 10*3/uL (ref 0.1–0.9)
MONO%: 11.1 % (ref 0.0–14.0)
NEUT%: 72.2 % (ref 38.4–76.8)
NEUTROS ABS: 5.5 10*3/uL (ref 1.5–6.5)
PLATELETS: 157 10*3/uL (ref 145–400)
RBC: 3.4 10*6/uL — AB (ref 3.70–5.45)
RDW: 17.3 % — ABNORMAL HIGH (ref 11.2–14.5)
WBC: 7.6 10*3/uL (ref 3.9–10.3)
lymph#: 1.1 10*3/uL (ref 0.9–3.3)

## 2014-11-23 LAB — COMPREHENSIVE METABOLIC PANEL (CC13)
ALT: 11 U/L (ref 0–55)
ANION GAP: 11 meq/L (ref 3–11)
AST: 14 U/L (ref 5–34)
Albumin: 3.5 g/dL (ref 3.5–5.0)
Alkaline Phosphatase: 107 U/L (ref 40–150)
BUN: 35.7 mg/dL — AB (ref 7.0–26.0)
CALCIUM: 10.2 mg/dL (ref 8.4–10.4)
CHLORIDE: 97 meq/L — AB (ref 98–109)
CO2: 26 meq/L (ref 22–29)
Creatinine: 7.8 mg/dL (ref 0.6–1.1)
EGFR: 5 mL/min/{1.73_m2} — ABNORMAL LOW (ref >90)
Glucose: 163 mg/dl — ABNORMAL HIGH (ref 70–140)
Potassium: 4.8 mEq/L (ref 3.5–5.1)
Sodium: 134 mEq/L — ABNORMAL LOW (ref 136–145)
Total Bilirubin: 0.34 mg/dL (ref 0.20–1.20)
Total Protein: 7.2 g/dL (ref 6.4–8.3)

## 2014-11-23 LAB — LACTATE DEHYDROGENASE (CC13): LDH: 185 U/L (ref 125–245)

## 2014-11-23 NOTE — Progress Notes (Signed)
Pt has a "broken" fistula in her left arm, and a new fistula in her right arm. Confirmed with Dr. Julien Nordmann advised ok to use left arm to access for blood draw. Anselm Jungling, Lab Tech ok to draw from left arm.

## 2014-11-25 ENCOUNTER — Telehealth: Payer: Self-pay

## 2014-11-25 LAB — IGG, IGA, IGM
IgA: 160 mg/dL (ref 69–380)
IgG (Immunoglobin G), Serum: 1550 mg/dL (ref 690–1700)
IgM, Serum: 111 mg/dL (ref 52–322)

## 2014-11-25 LAB — KAPPA/LAMBDA LIGHT CHAINS
KAPPA LAMBDA RATIO: 0.98 (ref 0.26–1.65)
Kappa free light chain: 18.8 mg/dL — ABNORMAL HIGH (ref 0.33–1.94)
LAMBDA FREE LGHT CHN: 19.1 mg/dL — AB (ref 0.57–2.63)

## 2014-11-25 LAB — BETA 2 MICROGLOBULIN, SERUM: BETA 2 MICROGLOBULIN: 29.6 mg/L — AB (ref <=2.51)

## 2014-11-25 NOTE — Telephone Encounter (Signed)
Returning pt call. Pt was asking if she should take her 40 dexamethasone before her visit with Dr Julien Nordmann. She is currently on observation. Instructed her not to take the dexamethasone until she sees the MD and if they decide together to restart chemo.

## 2014-11-30 ENCOUNTER — Encounter: Payer: Self-pay | Admitting: Internal Medicine

## 2014-11-30 ENCOUNTER — Ambulatory Visit (HOSPITAL_BASED_OUTPATIENT_CLINIC_OR_DEPARTMENT_OTHER): Payer: Medicare Other | Admitting: Internal Medicine

## 2014-11-30 ENCOUNTER — Telehealth: Payer: Self-pay | Admitting: Internal Medicine

## 2014-11-30 VITALS — BP 151/87 | HR 82 | Temp 98.4°F | Resp 18 | Ht 65.5 in | Wt 206.1 lb

## 2014-11-30 DIAGNOSIS — C9000 Multiple myeloma not having achieved remission: Secondary | ICD-10-CM | POA: Diagnosis not present

## 2014-11-30 NOTE — Progress Notes (Signed)
[facility] Telephone:([phone]   Fax:([phone]) [phone]  OFFICE PROGRESS NOTE  Julia Brazil, MD [place] [address]  DIAGNOSIS:  #1 plasma cell dyscrasia, multiple myeloma.  #2 anemia of chronic disease secondary to chronic kidney disease.   PRIOR THERAPY: Treatment with single agent subcutaneous Velcade 1.3 mg/m2 on a weekly basis with Decadron 40 mg by mouth weekly, status post 34 cycles. Last dose was given on 07/27/2014.   CURRENT THERAPY: Observation.  INTERVAL HISTORY: [name] 81 y.o. female returns to the clinic today for followup visit accompanied by her daughter. The patient has been observation for the last 4 months. She is feeling fine today was no specific complaints except for trouble with her AV fistula of the left arm for the dialysis. She is followed by vascular surgery for this problem. She had a new fistula performed in the right arm. The patient continues her hemodialysis on Monday, Wednesday and Friday. She denied having any fever or chills, no nausea or vomiting. The patient denied having any significant chest pain, shortness of breath, cough or hemoptysis. She has no significant weight loss or night sweats. She had repeat myeloma panel performed recently and she is here for evaluation and discussion of her lab results.  MEDICAL HISTORY: Past Medical History  Diagnosis Date  . Hypertension   . Asthma   . GERD (gastroesophageal reflux disease)   . Neuropathy in diabetes     Hx: of  . COPD (chronic obstructive pulmonary disease)   . Anemia   . High cholesterol   . Pulmonary embolism 1987  . Type II diabetes mellitus   . History of blood transfusion 1987; 2011    "related to lung OR; HgB went down to 5"  . Arthritis     "hips, knees, shoulders" (11/10/2013)  . Chronic lower back pain   . Gout   . Multiple myeloma   . Neuropathy   . Hypothyroidism   . ESRD (end stage renal disease) on dialysis       "1st treatment 11/09/2013" MWF  . Kidney stones     ALLERGIES:  is allergic to penicillins cross reactors.  MEDICATIONS:  Current Outpatient Prescriptions  Medication Sig Dispense Refill  . acyclovir (ZOVIRAX) 400 MG tablet TAKE 1 TABLET BY MOUTH DAILY 30 tablet 1  . albuterol (PROVENTIL HFA;VENTOLIN HFA) 108 (90 BASE) MCG/ACT inhaler Inhale 1 puff into the lungs 2 (two) times daily as needed for wheezing or shortness of breath.    . allopurinol (ZYLOPRIM) 100 MG tablet Take 100 mg by mouth daily with breakfast.     . amLODipine (NORVASC) 5 MG tablet Take 5 mg by mouth daily with breakfast.     . B-D ULTRAFINE III SHORT PEN 31G X 8 MM MISC   4  . BESIVANCE 0.6 % SUSP Place 1 drop into the left eye daily.   1  . Cyanocobalamin (VITAMIN B-12 PO) Take 1 tablet by mouth daily.    . cyclobenzaprine (FLEXERIL) 5 MG tablet Take 5 mg by mouth 3 (three) times daily as needed for muscle spasms.   0  . dexamethasone (DECADRON) 4 MG tablet Take 10 tablets (40 mg total) by mouth once a week. 40 tablet 1  . gabapentin (NEURONTIN) 100 MG capsule Take 100 mg by mouth 3 (three) times daily.    Marland Kitchen glimepiride (AMARYL) 1 MG tablet Take 1 mg by mouth daily with breakfast.     .  HYDROcodone-acetaminophen (NORCO) 7.5-325 MG per tablet Take 1 tablet by mouth every 6 (six) hours as needed for moderate pain.    . hydrOXYzine (ATARAX/VISTARIL) 25 MG tablet Take 25 mg by mouth every 12 (twelve) hours as needed for anxiety.   2  . ILEVRO 0.3 % ophthalmic suspension Place 1 drop into both eyes daily.   1  . LEVEMIR FLEXTOUCH 100 UNIT/ML Pen   5  . levothyroxine (SYNTHROID, LEVOTHROID) 100 MCG tablet Take 100 mcg by mouth daily.  4  . lidocaine-prilocaine (EMLA) cream     . Multiple Vitamins-Minerals (MULTIVITAMIN WITH MINERALS) tablet Take 2 tablets by mouth daily.    . multivitamin (RENA-VIT) TABS tablet Take 1 tablet by mouth daily.    . ondansetron (ZOFRAN) 8 MG tablet Take 1 tablet (8 mg total) by mouth every 8  (eight) hours as needed for nausea or vomiting. 20 tablet 1  . ONE TOUCH ULTRA TEST test strip     . Oxycodone HCl 20 MG TABS Take 20 mg by mouth every 12 (twelve) hours.     Marland Kitchen oxyCODONE-acetaminophen (ROXICET) 5-325 MG per tablet Take 1-2 tablets by mouth every 4 (four) hours as needed for severe pain. 30 tablet 0  . pantoprazole (PROTONIX) 40 MG tablet Take 40 mg by mouth 2 (two) times daily.     . saxagliptin HCl (ONGLYZA) 2.5 MG TABS tablet Take 2.5 mg by mouth daily with breakfast.      No current facility-administered medications for this visit.    SURGICAL HISTORY:  Past Surgical History  Procedure Laterality Date  . Lung removal, partial Left 1987    "blood clot"  . Cholecystectomy    . Av fistula placement Left 2011    "lower arm; never used"  . Colonoscopy    . Abdominoplasty  ~ 1983    "tummy tuck"  . Abdominal hysterectomy  1964  . Av fistula placement Left 05/15/2013    Procedure: INSERTION OF ARTERIOVENOUS (AV) GORE-TEX GRAFT ARM-LEFT UPPER ARM;  Surgeon: Mal Misty, MD;  Location: [facility];  Service: Vascular;  Laterality: Left;  . Appendectomy  1954  . Umbilical hernia repair  ~ 1983  . Tubal ligation  1964  . Cystoscopy w/ stone manipulation  1980's  . Hernia repair      umbicical hernia  . Eye surgery Bilateral 2016    CAtaract-   . Av fistula placement Right 10/19/2014    Procedure: INSERTION OF ARTERIOVENOUS (AV) 4-7mm x 45cm GORE-TEX GRAFT RIGHT FOREARM;  Surgeon: Angelia Mould, MD;  Location: [facility];  Service: Vascular;  Laterality: Right;    REVIEW OF SYSTEMS:  Constitutional: positive for fatigue Eyes: negative Ears, nose, mouth, throat, and face: negative Respiratory: negative Cardiovascular: negative Gastrointestinal: negative Genitourinary:negative Integument/breast: negative Hematologic/lymphatic: negative Musculoskeletal:negative Neurological: negative Behavioral/Psych: negative Endocrine: negative Allergic/Immunologic: negative    PHYSICAL EXAMINATION: General appearance: alert, cooperative, fatigued and no distress Head: Normocephalic, without obvious abnormality, atraumatic Neck: no adenopathy Lymph nodes: Cervical, supraclavicular, and axillary nodes normal. Resp: clear to auscultation bilaterally Back: symmetric, no curvature. ROM normal. No CVA tenderness. Cardio: regular rate and rhythm, S1, S2 normal, no murmur, click, rub or gallop GI: soft, non-tender; bowel sounds normal; no masses,  no organomegaly Extremities: extremities normal, atraumatic, no cyanosis or edema Neurologic: Alert and oriented X 3, normal strength and tone. Normal symmetric reflexes. Normal coordination and gait  ECOG PERFORMANCE STATUS: 2 - Symptomatic, <50% confined to bed  Blood pressure 151/87, pulse 82, temperature 98.4  F (36.9 C), temperature source Oral, resp. rate 18, height 5' 5.5" (1.664 m), weight 206 lb 1.6 oz (93.486 kg), SpO2 100 %.  LABORATORY DATA: Lab Results  Component Value Date   WBC 7.6 11/23/2014   HGB 10.4* 11/23/2014   HCT 32.1* 11/23/2014   MCV 94.5 11/23/2014   PLT 157 11/23/2014      Chemistry      Component Value Date/Time   NA 134* 11/23/2014 0902   NA 134* 10/19/2014 0855   K 4.8 11/23/2014 0902   K 5.1 10/19/2014 0855   CL 98 11/10/2013 0748   CL 98 09/17/2012 1242   CO2 26 11/23/2014 0902   CO2 30 11/10/2013 0748   BUN 35.7* 11/23/2014 0902   BUN 80* 11/10/2013 0748   CREATININE 7.8* 11/23/2014 0902   CREATININE 3.66* 11/10/2013 0748      Component Value Date/Time   CALCIUM 10.2 11/23/2014 0902   CALCIUM 9.5 11/10/2013 0748   CALCIUM 8.6 12/02/2012 0933   ALKPHOS 107 11/23/2014 0902   ALKPHOS 81 03/26/2012 1643   AST 14 11/23/2014 0902   AST 23 03/26/2012 1643   ALT 11 11/23/2014 0902   ALT 14 03/26/2012 1643   BILITOT 0.34 11/23/2014 0902   BILITOT 0.2* 03/26/2012 1643     Other lab results: Beta-2 microglobulin 29.60. Free kappa light chain 18.80, free lambda light chain  19.10, kappa/lambda ratio 0.81. IgG 1550, IgA 160 and IgM 111.  ASSESSMENT: This is a very pleasant 81 years old African American female with plasma cell dyscrasia completed treatment with weekly subcutaneous Velcade and Decadron status post 34 weekly doses and tolerating it fairly well  The patient has been observation for the last 4 months and feeling fine.  The myeloma panel performed recently showed evidence for disease progression. I discussed the lab result with the patient and her daughter today. I gave her the option of resuming her systemic treatment with subcutaneous Velcade and Decadron versus continuous observation and close monitoring. The patient would like to resume her treatment again. She will start cycle #35 later this week. She will come back for follow-up visit in 3 weeks after repeating myeloma panel for reevaluation of her disease. She was advised to call immediately if she has any concerning symptoms in the interval.  Disclaimer: This note was dictated with voice recognition software. Similar sounding words can inadvertently be transcribed and may not be corrected upon review.

## 2014-11-30 NOTE — Telephone Encounter (Signed)
Gave adn printed appt sched and avs fo rpt for June thru Aug °

## 2014-12-01 ENCOUNTER — Other Ambulatory Visit: Payer: Self-pay | Admitting: Medical Oncology

## 2014-12-01 DIAGNOSIS — C9000 Multiple myeloma not having achieved remission: Secondary | ICD-10-CM

## 2014-12-02 ENCOUNTER — Ambulatory Visit (HOSPITAL_BASED_OUTPATIENT_CLINIC_OR_DEPARTMENT_OTHER): Payer: Medicare Other

## 2014-12-02 ENCOUNTER — Other Ambulatory Visit (HOSPITAL_BASED_OUTPATIENT_CLINIC_OR_DEPARTMENT_OTHER): Payer: Medicare Other

## 2014-12-02 VITALS — BP 157/92 | HR 69 | Temp 99.0°F

## 2014-12-02 DIAGNOSIS — C9000 Multiple myeloma not having achieved remission: Secondary | ICD-10-CM

## 2014-12-02 DIAGNOSIS — Z5112 Encounter for antineoplastic immunotherapy: Secondary | ICD-10-CM | POA: Diagnosis not present

## 2014-12-02 LAB — COMPREHENSIVE METABOLIC PANEL (CC13)
ALBUMIN: 3.3 g/dL — AB (ref 3.5–5.0)
ALK PHOS: 83 U/L (ref 40–150)
ALT: 9 U/L (ref 0–55)
AST: 13 U/L (ref 5–34)
Anion Gap: 13 mEq/L — ABNORMAL HIGH (ref 3–11)
BUN: 30.2 mg/dL — ABNORMAL HIGH (ref 7.0–26.0)
CALCIUM: 9.4 mg/dL (ref 8.4–10.4)
CO2: 28 mEq/L (ref 22–29)
Chloride: 96 mEq/L — ABNORMAL LOW (ref 98–109)
Creatinine: 6.3 mg/dL (ref 0.6–1.1)
EGFR: 7 mL/min/{1.73_m2} — AB (ref >90)
Glucose: 157 mg/dl — ABNORMAL HIGH (ref 70–140)
POTASSIUM: 4.5 meq/L (ref 3.5–5.1)
Sodium: 137 mEq/L (ref 136–145)
Total Bilirubin: 0.43 mg/dL (ref 0.20–1.20)
Total Protein: 7.1 g/dL (ref 6.4–8.3)

## 2014-12-02 LAB — CBC WITH DIFFERENTIAL/PLATELET
BASO%: 0.4 % (ref 0.0–2.0)
BASOS ABS: 0 10*3/uL (ref 0.0–0.1)
EOS%: 1.8 % (ref 0.0–7.0)
Eosinophils Absolute: 0.1 10*3/uL (ref 0.0–0.5)
HCT: 31.5 % — ABNORMAL LOW (ref 34.8–46.6)
HGB: 10.4 g/dL — ABNORMAL LOW (ref 11.6–15.9)
LYMPH#: 0.9 10*3/uL (ref 0.9–3.3)
LYMPH%: 11.7 % — ABNORMAL LOW (ref 14.0–49.7)
MCH: 30.4 pg (ref 25.1–34.0)
MCHC: 33 g/dL (ref 31.5–36.0)
MCV: 92.1 fL (ref 79.5–101.0)
MONO#: 0.4 10*3/uL (ref 0.1–0.9)
MONO%: 4.5 % (ref 0.0–14.0)
NEUT%: 81.6 % — ABNORMAL HIGH (ref 38.4–76.8)
NEUTROS ABS: 6.4 10*3/uL (ref 1.5–6.5)
PLATELETS: 109 10*3/uL — AB (ref 145–400)
RBC: 3.42 10*6/uL — ABNORMAL LOW (ref 3.70–5.45)
RDW: 16.8 % — ABNORMAL HIGH (ref 11.2–14.5)
WBC: 7.9 10*3/uL (ref 3.9–10.3)
nRBC: 0 % (ref 0–0)

## 2014-12-02 MED ORDER — BORTEZOMIB CHEMO SQ INJECTION 3.5 MG (2.5MG/ML)
1.3000 mg/m2 | Freq: Once | INTRAMUSCULAR | Status: AC
  Administered 2014-12-02: 2.75 mg via SUBCUTANEOUS
  Filled 2014-12-02: qty 2.75

## 2014-12-02 MED ORDER — ONDANSETRON HCL 8 MG PO TABS
8.0000 mg | ORAL_TABLET | Freq: Once | ORAL | Status: AC
  Administered 2014-12-02: 8 mg via ORAL

## 2014-12-02 MED ORDER — ONDANSETRON HCL 8 MG PO TABS
ORAL_TABLET | ORAL | Status: AC
  Filled 2014-12-02: qty 1

## 2014-12-02 NOTE — Patient Instructions (Signed)
[facility] Discharge Instructions for Patients Receiving Chemotherapy  Today you received the following chemotherapy agents Velcade  To help prevent nausea and vomiting after your treatment, we encourage you to take your nausea medication    If you develop nausea and vomiting that is not controlled by your nausea medication, call the clinic.   BELOW ARE SYMPTOMS THAT SHOULD BE REPORTED IMMEDIATELY:  *FEVER GREATER THAN 100.5 F  *CHILLS WITH OR WITHOUT FEVER  NAUSEA AND VOMITING THAT IS NOT CONTROLLED WITH YOUR NAUSEA MEDICATION  *UNUSUAL SHORTNESS OF BREATH  *UNUSUAL BRUISING OR BLEEDING  TENDERNESS IN MOUTH AND THROAT WITH OR WITHOUT PRESENCE OF ULCERS  *URINARY PROBLEMS  *BOWEL PROBLEMS  UNUSUAL RASH Items with * indicate a potential emergency and should be followed up as soon as possible.  Feel free to call the clinic you have any questions or concerns. The clinic phone number is [phone].  Please show the CHEMO ALERT CARD at check-in to the Emergency Department and triage nurse.   

## 2014-12-02 NOTE — Progress Notes (Signed)
Per Dr. Julien Nordmann- ok to treat with Cr. 6.3.

## 2014-12-05 ENCOUNTER — Emergency Department (HOSPITAL_COMMUNITY): Payer: Medicare Other

## 2014-12-05 ENCOUNTER — Encounter (HOSPITAL_COMMUNITY): Payer: Self-pay

## 2014-12-05 ENCOUNTER — Emergency Department (HOSPITAL_COMMUNITY)
Admission: EM | Admit: 2014-12-05 | Discharge: 2014-12-05 | Disposition: A | Discharge status: Home / Self Care | Payer: Medicare Other | Attending: Emergency Medicine | Admitting: Emergency Medicine

## 2014-12-05 DIAGNOSIS — N186 End stage renal disease: Secondary | ICD-10-CM | POA: Insufficient documentation

## 2014-12-05 DIAGNOSIS — Z79899 Other long term (current) drug therapy: Secondary | ICD-10-CM | POA: Insufficient documentation

## 2014-12-05 DIAGNOSIS — M109 Gout, unspecified: Secondary | ICD-10-CM | POA: Insufficient documentation

## 2014-12-05 DIAGNOSIS — K219 Gastro-esophageal reflux disease without esophagitis: Secondary | ICD-10-CM | POA: Insufficient documentation

## 2014-12-05 DIAGNOSIS — M1711 Unilateral primary osteoarthritis, right knee: Secondary | ICD-10-CM | POA: Diagnosis not present

## 2014-12-05 DIAGNOSIS — Z862 Personal history of diseases of the blood and blood-forming organs and certain disorders involving the immune mechanism: Secondary | ICD-10-CM | POA: Diagnosis not present

## 2014-12-05 DIAGNOSIS — Z86711 Personal history of pulmonary embolism: Secondary | ICD-10-CM | POA: Diagnosis not present

## 2014-12-05 DIAGNOSIS — Z87442 Personal history of urinary calculi: Secondary | ICD-10-CM | POA: Insufficient documentation

## 2014-12-05 DIAGNOSIS — G8929 Other chronic pain: Secondary | ICD-10-CM | POA: Diagnosis not present

## 2014-12-05 DIAGNOSIS — J449 Chronic obstructive pulmonary disease, unspecified: Secondary | ICD-10-CM | POA: Diagnosis not present

## 2014-12-05 DIAGNOSIS — M25561 Pain in right knee: Secondary | ICD-10-CM | POA: Diagnosis present

## 2014-12-05 DIAGNOSIS — E1121 Type 2 diabetes mellitus with diabetic nephropathy: Secondary | ICD-10-CM | POA: Diagnosis not present

## 2014-12-05 DIAGNOSIS — I120 Hypertensive chronic kidney disease with stage 5 chronic kidney disease or end stage renal disease: Secondary | ICD-10-CM | POA: Insufficient documentation

## 2014-12-05 DIAGNOSIS — Z8579 Personal history of other malignant neoplasms of lymphoid, hematopoietic and related tissues: Secondary | ICD-10-CM | POA: Diagnosis not present

## 2014-12-05 DIAGNOSIS — Z880 Allergy status to penicillin: Secondary | ICD-10-CM | POA: Diagnosis not present

## 2014-12-05 DIAGNOSIS — E039 Hypothyroidism, unspecified: Secondary | ICD-10-CM | POA: Diagnosis not present

## 2014-12-05 NOTE — ED Provider Notes (Signed)
CSN: [id number]     Arrival date & time 12/05/14  1330 History   First MD Initiated Contact with Patient 12/05/14 1506     Chief Complaint  Patient presents with  . Knee Pain     (Consider location/radiation/quality/duration/timing/severity/associated sxs/prior Treatment) HPI  Pt presenting with c/o right knee pain.  Pt states pain has been ongoing for several months, but is worse over the past 2 days.  No trauma.  She takes hydrocodone 7.5 every 6 hours, and oxycontin twice daily.  She states this usually helps with both her back and knee pain but no over the past 2 days.  No redness.  No fever.  Some increased swelling around knee.  She is able to walk with a cane.  Movement and palpation make the pain worse. There are no other associated systemic symptoms, there are no other alleviating or modifying factors.   Past Medical History  Diagnosis Date  . Hypertension   . Asthma   . GERD (gastroesophageal reflux disease)   . Neuropathy in diabetes     Hx: of  . COPD (chronic obstructive pulmonary disease)   . Anemia   . High cholesterol   . Pulmonary embolism 1987  . Type II diabetes mellitus   . History of blood transfusion 1987; 2011    "related to lung OR; HgB went down to 5"  . Arthritis     "hips, knees, shoulders" (11/10/2013)  . Chronic lower back pain   . Gout   . Multiple myeloma   . Neuropathy   . Hypothyroidism   . ESRD (end stage renal disease) on dialysis     "1st treatment 11/09/2013" MWF  . Kidney stones    Past Surgical History  Procedure Laterality Date  . Lung removal, partial Left 1987    "blood clot"  . Cholecystectomy    . Av fistula placement Left 2011    "lower arm; never used"  . Colonoscopy    . Abdominoplasty  ~ 1983    "tummy tuck"  . Abdominal hysterectomy  1964  . Av fistula placement Left 05/15/2013    Procedure: INSERTION OF ARTERIOVENOUS (AV) GORE-TEX GRAFT ARM-LEFT UPPER ARM;  Surgeon: Mal Misty, MD;  Location: [facility];  Service: Vascular;   Laterality: Left;  . Appendectomy  1954  . Umbilical hernia repair  ~ 1983  . Tubal ligation  1964  . Cystoscopy w/ stone manipulation  1980's  . Hernia repair      umbicical hernia  . Eye surgery Bilateral 2016    CAtaract-   . Av fistula placement Right 10/19/2014    Procedure: INSERTION OF ARTERIOVENOUS (AV) 4-72mm x 45cm GORE-TEX GRAFT RIGHT FOREARM;  Surgeon: Angelia Mould, MD;  Location: [facility] OR;  Service: Vascular;  Laterality: Right;   Family History  Problem Relation Age of Onset  . Blindness Father   . Diabetes Father   . Diabetes Sister   . Kidney failure Brother    History  Substance Use Topics  . Smoking status: Never Smoker   . Smokeless tobacco: Never Used  . Alcohol Use: No   OB History    No data available     Review of Systems  ROS reviewed and all otherwise negative except for mentioned in HPI    Allergies  Penicillins cross reactors  Home Medications   Prior to Admission medications   Medication Sig Start Date End Date Taking? Authorizing Provider  acyclovir (ZOVIRAX) 400 MG tablet TAKE 1 TABLET  BY MOUTH DAILY 06/16/14  Yes Curt Bears, MD  albuterol (PROVENTIL HFA;VENTOLIN HFA) 108 (90 BASE) MCG/ACT inhaler Inhale 1 puff into the lungs 2 (two) times daily as needed for wheezing or shortness of breath.   Yes Historical Provider, MD  allopurinol (ZYLOPRIM) 100 MG tablet Take 100 mg by mouth daily with breakfast.    Yes Historical Provider, MD  amLODipine (NORVASC) 5 MG tablet Take 5 mg by mouth daily with breakfast. Patient states she only takes medication on days that she does not have dialysis which is 4 days out of the week. Receives dialysis on Monday, Wednesday and Fridays.   Yes Historical Provider, MD  B-D ULTRAFINE III SHORT PEN 31G X 8 MM MISC  07/19/14  Yes Historical Provider, MD  Cyanocobalamin (VITAMIN B-12 PO) Take 1 tablet by mouth daily.   Yes Historical Provider, MD  cyclobenzaprine (FLEXERIL) 5 MG tablet Take 5 mg by mouth 3  (three) times daily as needed for muscle spasms.  02/03/14  Yes Historical Provider, MD  dexamethasone (DECADRON) 4 MG tablet Take 10 tablets (40 mg total) by mouth once a week. 06/15/14  Yes Curt Bears, MD  gabapentin (NEURONTIN) 100 MG capsule Take 100 mg by mouth 3 (three) times daily.   Yes Historical Provider, MD  glimepiride (AMARYL) 1 MG tablet Take 1 mg by mouth daily with breakfast.    Yes Historical Provider, MD  HYDROcodone-acetaminophen (NORCO) 7.5-325 MG per tablet Take 1 tablet by mouth every 6 (six) hours as needed for moderate pain. 05/22/13  Yes Corky Sox, MD  hydrOXYzine (ATARAX/VISTARIL) 25 MG tablet Take 25 mg by mouth every 12 (twelve) hours as needed for itching.  09/07/14  Yes Historical Provider, MD  LEVEMIR FLEXTOUCH 100 UNIT/ML Pen Inject 12 Units into the skin daily as needed (high blood sugar). If Blood sugar is above 110 pt will take 09/15/14  Yes Historical Provider, MD  levothyroxine (SYNTHROID, LEVOTHROID) 100 MCG tablet Take 100 mcg by mouth daily. 09/01/14  Yes Historical Provider, MD  lidocaine-prilocaine (EMLA) cream Apply 1 application topically as needed (for numbing).  11/06/13  Yes Historical Provider, MD  multivitamin (RENA-VIT) TABS tablet Take 1 tablet by mouth daily.   Yes Historical Provider, MD  ondansetron (ZOFRAN) 8 MG tablet Take 1 tablet (8 mg total) by mouth every 8 (eight) hours as needed for nausea or vomiting. 08/05/13  Yes Curt Bears, MD  ONE TOUCH ULTRA TEST test strip  12/05/13  Yes Historical Provider, MD  Oxycodone HCl 20 MG TABS Take 20 mg by mouth every 12 (twelve) hours.  06/07/13  Yes Historical Provider, MD  oxyCODONE-acetaminophen (ROXICET) 5-325 MG per tablet Take 1-2 tablets by mouth every 4 (four) hours as needed for severe pain. 10/19/14  Yes Angelia Mould, MD  pantoprazole (PROTONIX) 40 MG tablet Take 40 mg by mouth 2 (two) times daily.    Yes Historical Provider, MD  saxagliptin HCl (ONGLYZA) 2.5 MG TABS tablet Take 2.5 mg by  mouth daily with breakfast.    Yes Historical Provider, MD   BP 142/60 mmHg  Pulse 84  Temp(Src) 98.2 F (36.8 C) (Oral)  Resp 20  SpO2 100%  Vitals reviewed Physical Exam  Physical Examination: General appearance - alert, well appearing, and in no distress Mental status - alert, oriented to person, place, and time Eyes - no conjunctival injection, no scleral icterus Mouth - mucous membranes moist, pharynx normal without lesions Chest - clear to auscultation, no wheezes, rales or rhonchi, symmetric  air entry Heart - normal rate, regular rhythm, normal S1, S2, no murmurs, rubs, clicks or gallops Abdomen - soft, nontender, nondistended, no masses or organomegaly Neurological - alert, oriented Extremities - peripheral pulses normal, no pedal edema, no clubbing or cyanosis, ttp over anterior right knee, mild swelling around knee, no overlying erythema, warmth Skin - normal coloration and turgor, no rashes  ED Course  Procedures (including critical care time) Labs Review Labs Reviewed - No data to display  Imaging Review Dg Knee Complete 4 Views Right  12/05/2014   CLINICAL DATA:  Chronic progressive right knee pain.  EXAM: RIGHT KNEE - COMPLETE 4+ VIEW  COMPARISON:  .  FINDINGS: There are slight tricompartmental osteoarthritic changes with a small joint effusion. No fracture or dislocation or bone destruction.  IMPRESSION: Tricompartmental arthritis.  Small joint effusion.   Electronically Signed   By: Lorriane Shire M.D.   On: 12/05/2014 14:37     EKG Interpretation None      MDM   Final diagnoses:  Primary osteoarthritis of right knee    Pt presenting with right knee pain, xray shows osteoarthritis.  Doubt septic joint or other acute abnormality.  Pt is already on hydrocodone 7.$RemoveBeforeDE'5mg'HkwoopcYblcJFCZ$  every 6 hours at home.  Will not increase this dose at this time.  Advised f/u appointment with orthopedics.  Discharged with strict return precautions.  Pt agreeable with plan.    Alfonzo Beers,  MD 12/05/14 [phone]

## 2014-12-05 NOTE — ED Notes (Signed)
Pt c/o R knee pain x 6 months increasing x 2 days.  Pain score 10/10.  Denies injury.  Pt reports being given pain medication and "cream" by PCP which has stopped providing relief.  Pt ambulated to Triage room.

## 2014-12-05 NOTE — Discharge Instructions (Signed)
Return to the ED with any concerns including increased pain, not able to bear weight, fever/chills, decreased level of alertness/lethargy, or any other alarming symptoms

## 2014-12-09 ENCOUNTER — Other Ambulatory Visit (HOSPITAL_BASED_OUTPATIENT_CLINIC_OR_DEPARTMENT_OTHER): Payer: Medicare Other

## 2014-12-09 ENCOUNTER — Other Ambulatory Visit: Payer: Self-pay | Admitting: *Deleted

## 2014-12-09 ENCOUNTER — Ambulatory Visit (HOSPITAL_BASED_OUTPATIENT_CLINIC_OR_DEPARTMENT_OTHER): Payer: Medicare Other

## 2014-12-09 VITALS — BP 143/88 | HR 63 | Temp 98.5°F | Resp 16

## 2014-12-09 DIAGNOSIS — Z5112 Encounter for antineoplastic immunotherapy: Secondary | ICD-10-CM | POA: Diagnosis not present

## 2014-12-09 DIAGNOSIS — C9000 Multiple myeloma not having achieved remission: Secondary | ICD-10-CM

## 2014-12-09 LAB — COMPREHENSIVE METABOLIC PANEL (CC13)
ALK PHOS: 85 U/L (ref 40–150)
ALT: 11 U/L (ref 0–55)
AST: 11 U/L (ref 5–34)
Albumin: 3.3 g/dL — ABNORMAL LOW (ref 3.5–5.0)
Anion Gap: 13 mEq/L — ABNORMAL HIGH (ref 3–11)
BUN: 42.6 mg/dL — ABNORMAL HIGH (ref 7.0–26.0)
CO2: 25 mEq/L (ref 22–29)
Calcium: 9.5 mg/dL (ref 8.4–10.4)
Chloride: 102 mEq/L (ref 98–109)
Creatinine: 7.2 mg/dL (ref 0.6–1.1)
EGFR: 6 mL/min/{1.73_m2} — ABNORMAL LOW (ref >90)
Glucose: 169 mg/dl — ABNORMAL HIGH (ref 70–140)
Potassium: 5.3 mEq/L — ABNORMAL HIGH (ref 3.5–5.1)
Sodium: 140 mEq/L (ref 136–145)
TOTAL PROTEIN: 7 g/dL (ref 6.4–8.3)
Total Bilirubin: 0.35 mg/dL (ref 0.20–1.20)

## 2014-12-09 LAB — CBC WITH DIFFERENTIAL/PLATELET
BASO%: 0.2 % (ref 0.0–2.0)
Basophils Absolute: 0 10*3/uL (ref 0.0–0.1)
EOS%: 1.9 % (ref 0.0–7.0)
Eosinophils Absolute: 0.2 10*3/uL (ref 0.0–0.5)
HCT: 32.8 % — ABNORMAL LOW (ref 34.8–46.6)
HEMOGLOBIN: 10.8 g/dL — AB (ref 11.6–15.9)
LYMPH#: 1.2 10*3/uL (ref 0.9–3.3)
LYMPH%: 13.2 % — AB (ref 14.0–49.7)
MCH: 30.9 pg (ref 25.1–34.0)
MCHC: 32.9 g/dL (ref 31.5–36.0)
MCV: 93.7 fL (ref 79.5–101.0)
MONO#: 0.7 10*3/uL (ref 0.1–0.9)
MONO%: 7.9 % (ref 0.0–14.0)
NEUT%: 76.8 % (ref 38.4–76.8)
NEUTROS ABS: 6.9 10*3/uL — AB (ref 1.5–6.5)
Platelets: 114 10*3/uL — ABNORMAL LOW (ref 145–400)
RBC: 3.5 10*6/uL — ABNORMAL LOW (ref 3.70–5.45)
RDW: 17.3 % — AB (ref 11.2–14.5)
WBC: 9 10*3/uL (ref 3.9–10.3)

## 2014-12-09 MED ORDER — ONDANSETRON HCL 8 MG PO TABS
ORAL_TABLET | ORAL | Status: AC
  Filled 2014-12-09: qty 1

## 2014-12-09 MED ORDER — BORTEZOMIB CHEMO SQ INJECTION 3.5 MG (2.5MG/ML)
1.3000 mg/m2 | Freq: Once | INTRAMUSCULAR | Status: AC
  Administered 2014-12-09: 2.75 mg via SUBCUTANEOUS
  Filled 2014-12-09: qty 2.75

## 2014-12-09 MED ORDER — ONDANSETRON HCL 8 MG PO TABS
8.0000 mg | ORAL_TABLET | Freq: Once | ORAL | Status: AC
  Administered 2014-12-09: 8 mg via ORAL

## 2014-12-09 NOTE — Patient Instructions (Signed)
[facility] Discharge Instructions for Patients Receiving Chemotherapy  Today you received the following chemotherapy agents VELCADE SQ  To help prevent nausea and vomiting after your treatment, we encourage you to take your nausea medication if needed   If you develop nausea and vomiting that is not controlled by your nausea medication, call the clinic.   BELOW ARE SYMPTOMS THAT SHOULD BE REPORTED IMMEDIATELY:  *FEVER GREATER THAN 100.5 F  *CHILLS WITH OR WITHOUT FEVER  NAUSEA AND VOMITING THAT IS NOT CONTROLLED WITH YOUR NAUSEA MEDICATION  *UNUSUAL SHORTNESS OF BREATH  *UNUSUAL BRUISING OR BLEEDING  TENDERNESS IN MOUTH AND THROAT WITH OR WITHOUT PRESENCE OF ULCERS  *URINARY PROBLEMS  *BOWEL PROBLEMS  UNUSUAL RASH Items with * indicate a potential emergency and should be followed up as soon as possible.  Feel free to call the clinic you have any questions or concerns. The clinic phone number is [phone].  Please show the [place] at check-in to the Emergency Department and triage nurse.

## 2014-12-15 ENCOUNTER — Other Ambulatory Visit: Payer: Self-pay | Admitting: Medical Oncology

## 2014-12-15 DIAGNOSIS — C9000 Multiple myeloma not having achieved remission: Secondary | ICD-10-CM

## 2014-12-16 ENCOUNTER — Ambulatory Visit (HOSPITAL_BASED_OUTPATIENT_CLINIC_OR_DEPARTMENT_OTHER): Payer: Medicare Other

## 2014-12-16 ENCOUNTER — Other Ambulatory Visit (HOSPITAL_BASED_OUTPATIENT_CLINIC_OR_DEPARTMENT_OTHER): Payer: Medicare Other

## 2014-12-16 VITALS — BP 146/81 | HR 65 | Temp 98.1°F | Resp 18

## 2014-12-16 DIAGNOSIS — Z5112 Encounter for antineoplastic immunotherapy: Secondary | ICD-10-CM

## 2014-12-16 DIAGNOSIS — C9000 Multiple myeloma not having achieved remission: Secondary | ICD-10-CM

## 2014-12-16 LAB — CBC WITH DIFFERENTIAL/PLATELET
BASO%: 0.7 % (ref 0.0–2.0)
BASOS ABS: 0.1 10*3/uL (ref 0.0–0.1)
EOS%: 2.3 % (ref 0.0–7.0)
Eosinophils Absolute: 0.2 10*3/uL (ref 0.0–0.5)
HEMATOCRIT: 33.8 % — AB (ref 34.8–46.6)
HEMOGLOBIN: 10.7 g/dL — AB (ref 11.6–15.9)
LYMPH%: 18.3 % (ref 14.0–49.7)
MCH: 30.3 pg (ref 25.1–34.0)
MCHC: 31.8 g/dL (ref 31.5–36.0)
MCV: 95.3 fL (ref 79.5–101.0)
MONO#: 0.9 10*3/uL (ref 0.1–0.9)
MONO%: 10.6 % (ref 0.0–14.0)
NEUT#: 5.6 10*3/uL (ref 1.5–6.5)
NEUT%: 68.1 % (ref 38.4–76.8)
PLATELETS: 104 10*3/uL — AB (ref 145–400)
RBC: 3.55 10*6/uL — AB (ref 3.70–5.45)
RDW: 18.6 % — AB (ref 11.2–14.5)
WBC: 8.2 10*3/uL (ref 3.9–10.3)
lymph#: 1.5 10*3/uL (ref 0.9–3.3)

## 2014-12-16 LAB — COMPREHENSIVE METABOLIC PANEL (CC13)
ALBUMIN: 3.1 g/dL — AB (ref 3.5–5.0)
ALK PHOS: 74 U/L (ref 40–150)
ALT: 8 U/L (ref 0–55)
AST: 10 U/L (ref 5–34)
Anion Gap: 11 mEq/L (ref 3–11)
BUN: 35.9 mg/dL — AB (ref 7.0–26.0)
CO2: 27 meq/L (ref 22–29)
Calcium: 8.9 mg/dL (ref 8.4–10.4)
Chloride: 100 mEq/L (ref 98–109)
Creatinine: 7.4 mg/dL (ref 0.6–1.1)
EGFR: 6 mL/min/{1.73_m2} — AB (ref >90)
GLUCOSE: 139 mg/dL (ref 70–140)
Potassium: 5 mEq/L (ref 3.5–5.1)
Sodium: 139 mEq/L (ref 136–145)
Total Bilirubin: 0.33 mg/dL (ref 0.20–1.20)
Total Protein: 6.8 g/dL (ref 6.4–8.3)

## 2014-12-16 IMAGING — CT CT ABD-PELV W/O CM
2 of 4 series · 16 of 46 positions shown, 18 images · non-contrast
Comparison: 03/26/2012

CLINICAL DATA: Lower abdominal pain.  Renal failure.  Gout.
Hypertension.  Prior anemia.

CT ABDOMEN AND PELVIS WITHOUT CONTRAST
TECHNIQUE: Multidetector CT imaging of the abdomen and pelvis was
performed following the standard protocol without intravenous
contrast.

[Series 2: abd/pelv w/o 5.0 b31f st · axial · non-contrast · 0.90mm/px · z∈[-464,-44]mm · 13 of 92 slices shown, 15 images]
[im 4/92  soft-tissue]
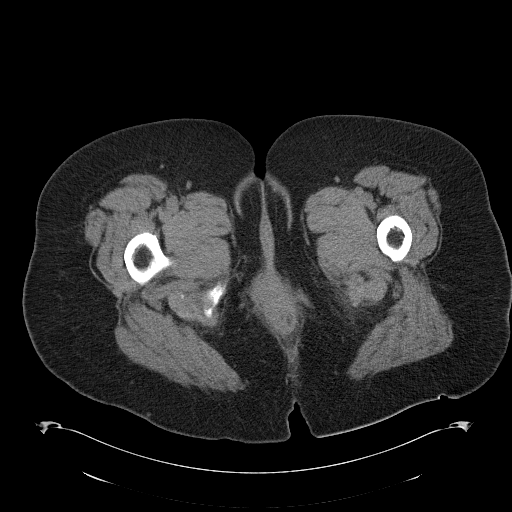
[im 4/92  bone]
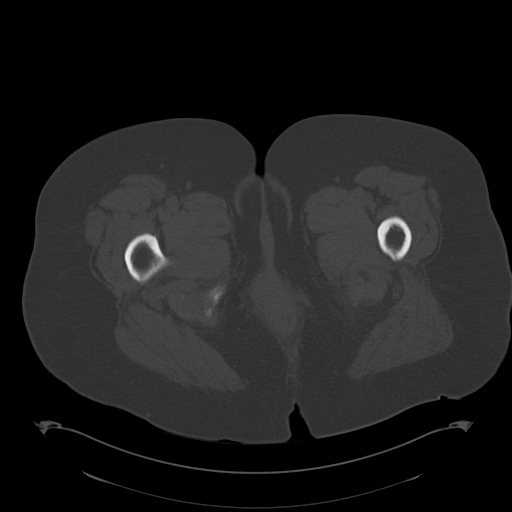
[im 11/92  soft-tissue]
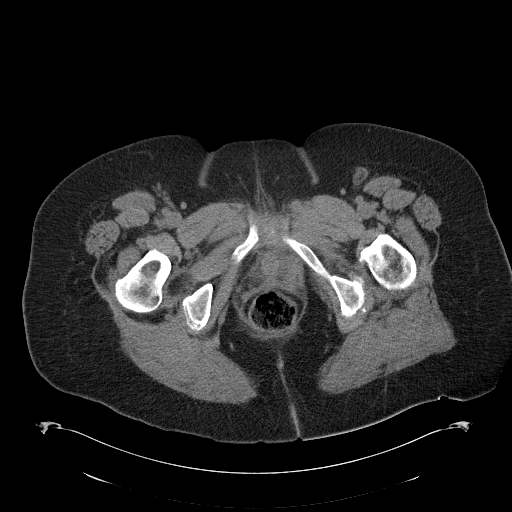
[im 19/92  soft-tissue]
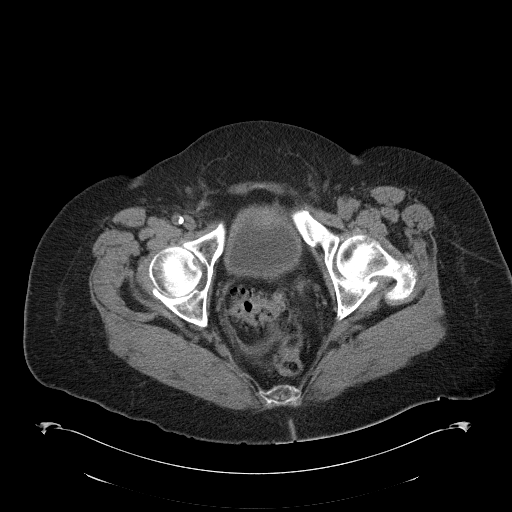
[im 26/92  soft-tissue]
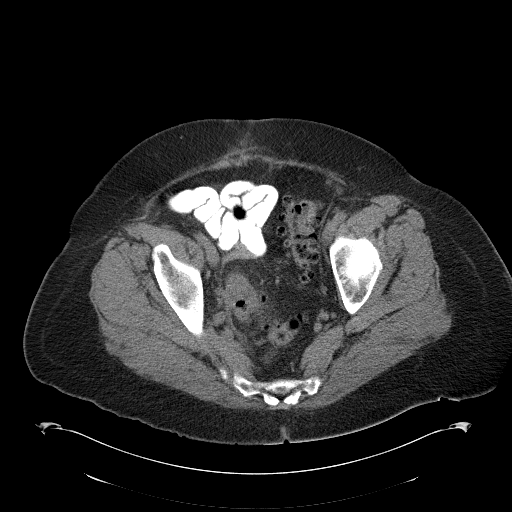
[im 33/92  soft-tissue]
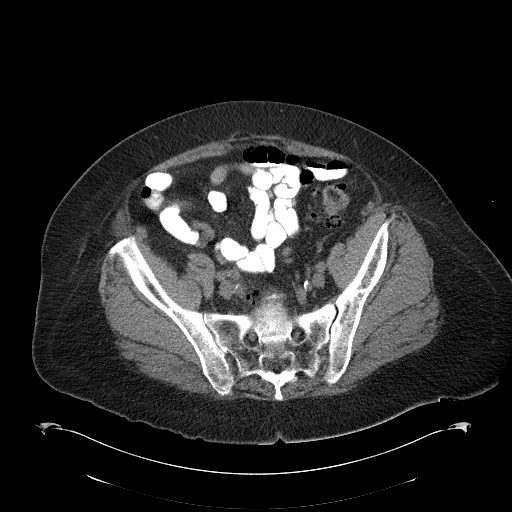
[im 41/92  soft-tissue]
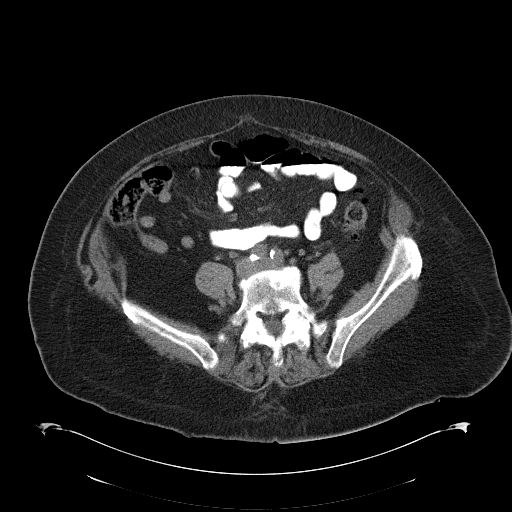
[im 48/92  soft-tissue]
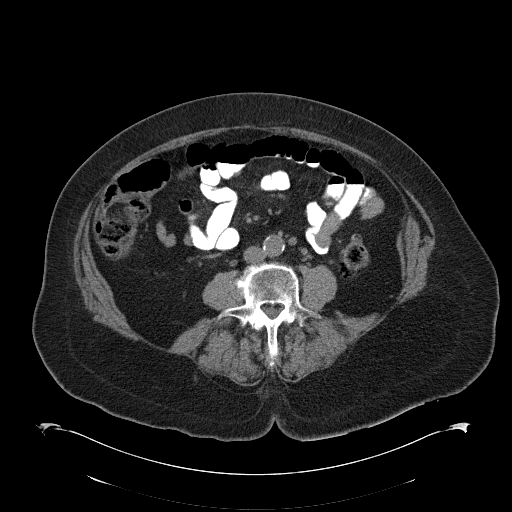
[im 51/92  soft-tissue]
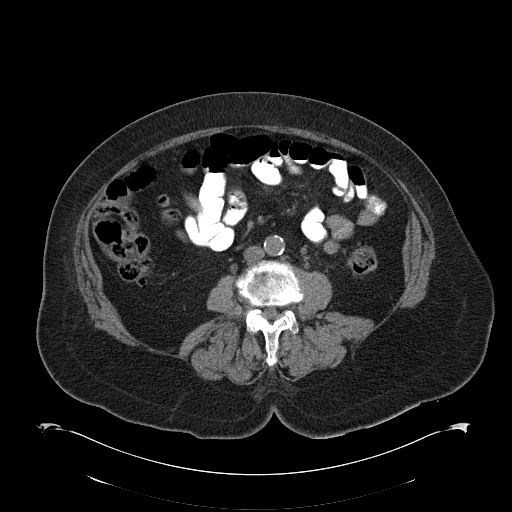
[im 59/92  soft-tissue]
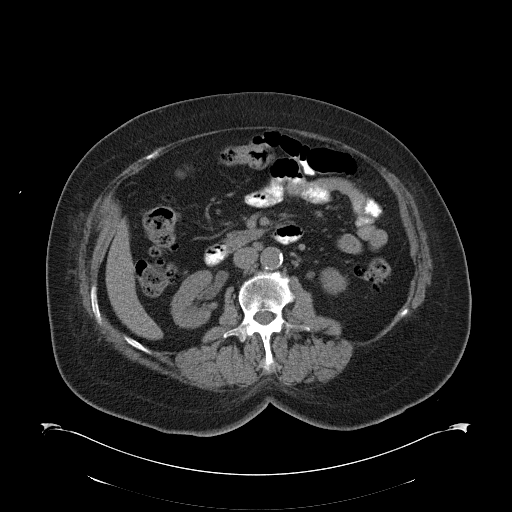
[im 59/92  bone]
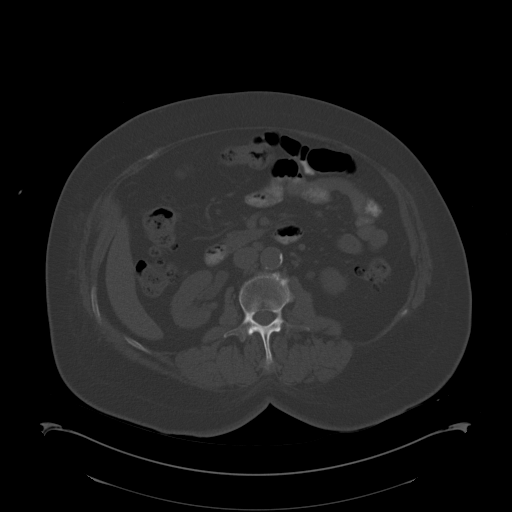
[im 66/92  soft-tissue]
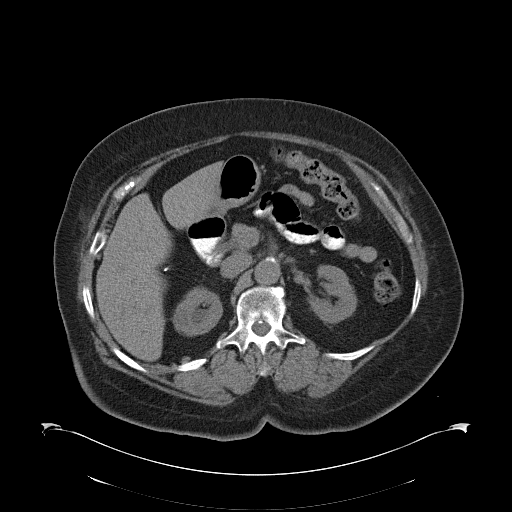
[im 73/92  soft-tissue]
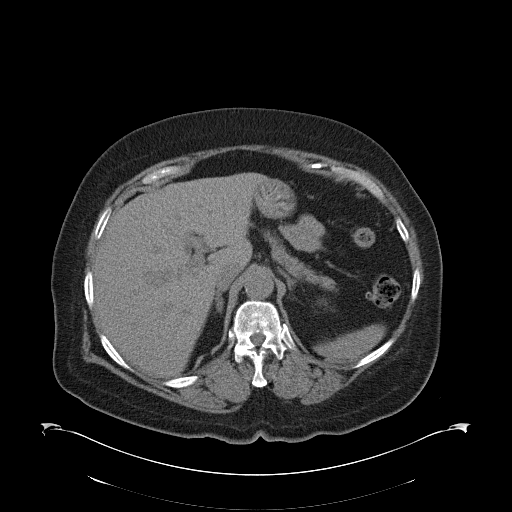
[im 81/92  soft-tissue]
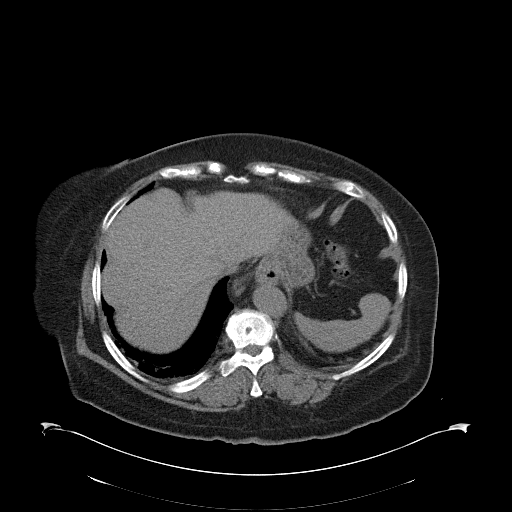
[im 88/92  soft-tissue]
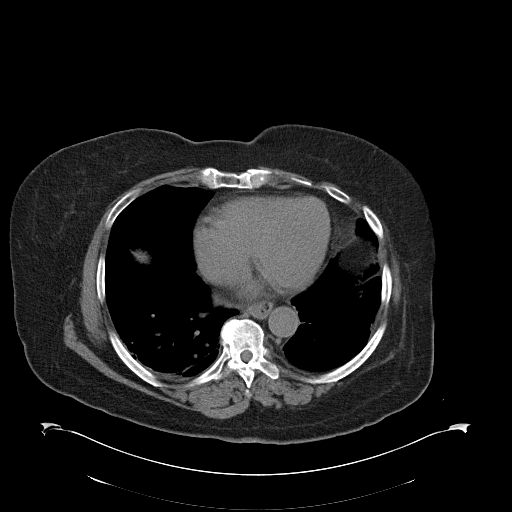

[Series 5: coronals · coronal · 1.04mm/px · 3 of 133 slices shown]
[im 45/133  soft-tissue]
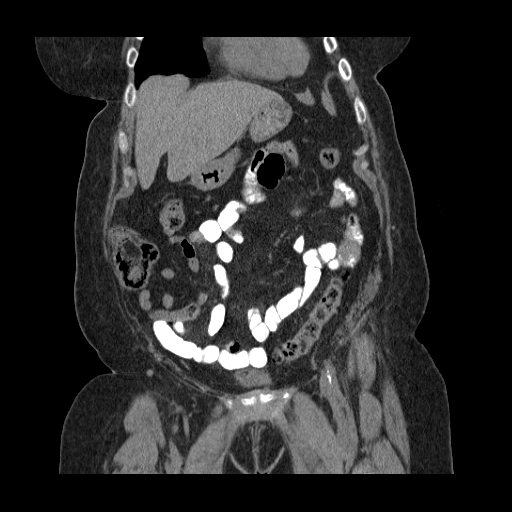
[im 59/133  soft-tissue]
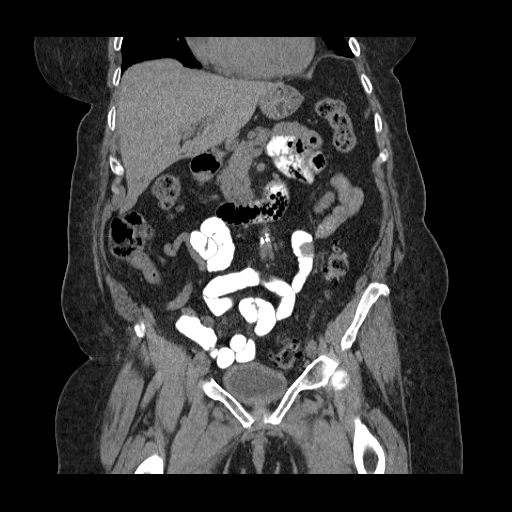
[im 74/133  soft-tissue]
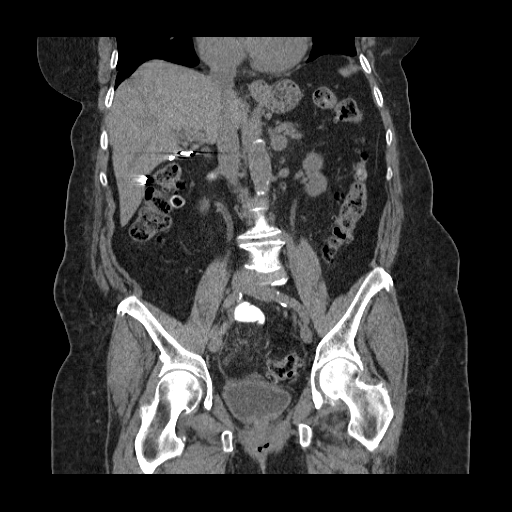

[16 of 46 positions shown; findings below may reference images not displayed]

FINDINGS: Scarring noted in the posterior basal segment right lower
lobe along with chronic reticulonodular interstitial accentuation
in the left lower lobe, unchanged from prior.  Right lower anterior
rib deformity noted.

The visualized portion of the liver, spleen, pancreas, and adrenal
glands appear unremarkable in noncontrast CT appearance.

The liver, spleen, and pancreas appear unremarkable noncontrast CT
appearance.  A 1.7 x 2.0 cm left adrenal mass is not changed in
size from 09/24/2012, and has internal density 30 HU, technically
nonspecific.  Right adrenal gland normal.

Faint hypodensity right kidney upper pole, likely a cyst.
Borderline proximal right hydroureter is again observed extending
down to the iliac vessel crossover.  Stable punctate calcifications
in the left kidney upper pole, likely small renal calculi in the 2-
3 mm range.  Scattered diverticular disease in the colon noted,
with concentrated diverticulosis in the sigmoid colon, and abnormal
focal wall thickening and surrounding stranding in the distal
sigmoid colon shown on images 60-74 of series 2, favoring acute
diverticulitis.

Gallbladder surgically absent.  Aortoiliac atherosclerotic vascular
calcification noted.  Aortocaval node short axis 0.7 cm, image 35
of series 2.  Small pelvic lymph nodes do not appear pathologically
enlarged by size criteria.  Terminal ileum unremarkable.

A small umbilical hernia contains adipose tissue.  Urinary bladder
wall thickening likely secondary to nondistension.

Moderate lumbar spondylosis and degenerative disc disease noted.
IMPRESSION: 1.  Sigmoid colon diverticulosis with abnormal wall thickening and
surrounding stranding in the distal sigmoid colon favoring acute
diverticulitis.  After resolution of symptoms, I would recommend
colonoscopy if not recently performed to exclude the unlikely
possibility of an inflamed sigmoid colon mass.
2.  Left adrenal mass is stable by my measurements and remains
nonspecific.  The lack of growth over last 6 months is supportive
that this probably does not represent a malignancy.  Noncontrast
MRI could be utilized if clinically feasible for more definitive
characterization.
3.  Left kidney upper pole nonobstructive nephrolithiasis.
4.  Stable mild right hydroureter to the level of the iliac vessel
crossover, without obvious obstructing lesion observed on today's
noncontrast exam.
5.  Moderate lumbar spondylosis and degenerative disc disease.

## 2014-12-16 MED ORDER — BORTEZOMIB CHEMO SQ INJECTION 3.5 MG (2.5MG/ML)
1.3000 mg/m2 | Freq: Once | INTRAMUSCULAR | Status: AC
  Administered 2014-12-16: 2.75 mg via SUBCUTANEOUS
  Filled 2014-12-16: qty 2.75

## 2014-12-16 MED ORDER — ONDANSETRON HCL 8 MG PO TABS
8.0000 mg | ORAL_TABLET | Freq: Once | ORAL | Status: AC
  Administered 2014-12-16: 8 mg via ORAL

## 2014-12-16 MED ORDER — ONDANSETRON HCL 8 MG PO TABS
ORAL_TABLET | ORAL | Status: AC
  Filled 2014-12-16: qty 1

## 2014-12-16 NOTE — Patient Instructions (Signed)
[facility] Discharge Instructions for Patients Receiving Chemotherapy  Today you received the following chemotherapy agents Velcade. To help prevent nausea and vomiting after your treatment, we encourage you to take your nausea medication as directed.  If you develop nausea and vomiting that is not controlled by your nausea medication, call the clinic.   BELOW ARE SYMPTOMS THAT SHOULD BE REPORTED IMMEDIATELY:  *FEVER GREATER THAN 100.5 F  *CHILLS WITH OR WITHOUT FEVER  NAUSEA AND VOMITING THAT IS NOT CONTROLLED WITH YOUR NAUSEA MEDICATION  *UNUSUAL SHORTNESS OF BREATH  *UNUSUAL BRUISING OR BLEEDING  TENDERNESS IN MOUTH AND THROAT WITH OR WITHOUT PRESENCE OF ULCERS  *URINARY PROBLEMS  *BOWEL PROBLEMS  UNUSUAL RASH Items with * indicate a potential emergency and should be followed up as soon as possible.  Feel free to call the clinic you have any questions or concerns. The clinic phone number is [phone].  Please show the CHEMO ALERT CARD at check-in to the Emergency Department and triage nurse.    

## 2014-12-16 NOTE — Progress Notes (Signed)
Creatine 7.4. Per Diane; Dr. Worthy Flank nurse, okay to proceed with treatment.

## 2014-12-22 ENCOUNTER — Other Ambulatory Visit: Payer: Self-pay | Admitting: Medical Oncology

## 2014-12-22 DIAGNOSIS — C9000 Multiple myeloma not having achieved remission: Secondary | ICD-10-CM

## 2014-12-23 ENCOUNTER — Ambulatory Visit (HOSPITAL_BASED_OUTPATIENT_CLINIC_OR_DEPARTMENT_OTHER): Payer: Medicare Other

## 2014-12-23 ENCOUNTER — Ambulatory Visit (HOSPITAL_BASED_OUTPATIENT_CLINIC_OR_DEPARTMENT_OTHER): Payer: Medicare Other | Admitting: Nurse Practitioner

## 2014-12-23 ENCOUNTER — Other Ambulatory Visit (HOSPITAL_BASED_OUTPATIENT_CLINIC_OR_DEPARTMENT_OTHER): Payer: Medicare Other

## 2014-12-23 VITALS — BP 151/83 | HR 78 | Temp 98.3°F | Resp 18 | Ht 65.5 in | Wt 206.3 lb

## 2014-12-23 DIAGNOSIS — N186 End stage renal disease: Secondary | ICD-10-CM | POA: Diagnosis not present

## 2014-12-23 DIAGNOSIS — C9000 Multiple myeloma not having achieved remission: Secondary | ICD-10-CM | POA: Diagnosis not present

## 2014-12-23 DIAGNOSIS — D631 Anemia in chronic kidney disease: Secondary | ICD-10-CM

## 2014-12-23 DIAGNOSIS — Z5112 Encounter for antineoplastic immunotherapy: Secondary | ICD-10-CM

## 2014-12-23 LAB — CBC WITH DIFFERENTIAL/PLATELET
BASO%: 0.5 % (ref 0.0–2.0)
BASOS ABS: 0 10*3/uL (ref 0.0–0.1)
EOS%: 1.2 % (ref 0.0–7.0)
Eosinophils Absolute: 0.1 10*3/uL (ref 0.0–0.5)
HCT: 36.1 % (ref 34.8–46.6)
HEMOGLOBIN: 11.6 g/dL (ref 11.6–15.9)
LYMPH#: 1.1 10*3/uL (ref 0.9–3.3)
LYMPH%: 12.1 % — ABNORMAL LOW (ref 14.0–49.7)
MCH: 30.6 pg (ref 25.1–34.0)
MCHC: 32 g/dL (ref 31.5–36.0)
MCV: 95.4 fL (ref 79.5–101.0)
MONO#: 0.8 10*3/uL (ref 0.1–0.9)
MONO%: 8.9 % (ref 0.0–14.0)
NEUT#: 7.1 10*3/uL — ABNORMAL HIGH (ref 1.5–6.5)
NEUT%: 77.3 % — AB (ref 38.4–76.8)
Platelets: 95 10*3/uL — ABNORMAL LOW (ref 145–400)
RBC: 3.79 10*6/uL (ref 3.70–5.45)
RDW: 18.8 % — ABNORMAL HIGH (ref 11.2–14.5)
WBC: 9.1 10*3/uL (ref 3.9–10.3)

## 2014-12-23 LAB — COMPREHENSIVE METABOLIC PANEL (CC13)
ALK PHOS: 89 U/L (ref 40–150)
ALT: 15 U/L (ref 0–55)
AST: 18 U/L (ref 5–34)
Albumin: 3.2 g/dL — ABNORMAL LOW (ref 3.5–5.0)
Anion Gap: 12 mEq/L — ABNORMAL HIGH (ref 3–11)
BILIRUBIN TOTAL: 0.4 mg/dL (ref 0.20–1.20)
BUN: 31.4 mg/dL — ABNORMAL HIGH (ref 7.0–26.0)
CO2: 31 meq/L — AB (ref 22–29)
Calcium: 9 mg/dL (ref 8.4–10.4)
Chloride: 98 mEq/L (ref 98–109)
Creatinine: 6.2 mg/dL (ref 0.6–1.1)
EGFR: 7 mL/min/{1.73_m2} — ABNORMAL LOW (ref >90)
GLUCOSE: 243 mg/dL — AB (ref 70–140)
POTASSIUM: 4.3 meq/L (ref 3.5–5.1)
SODIUM: 140 meq/L (ref 136–145)
TOTAL PROTEIN: 7 g/dL (ref 6.4–8.3)

## 2014-12-23 MED ORDER — DEXAMETHASONE 4 MG PO TABS: 40.0000 mg | ORAL_TABLET | ORAL | Status: DC

## 2014-12-23 MED ORDER — ONDANSETRON HCL 8 MG PO TABS
8.0000 mg | ORAL_TABLET | Freq: Once | ORAL | Status: AC
  Administered 2014-12-23: 8 mg via ORAL

## 2014-12-23 MED ORDER — BORTEZOMIB CHEMO SQ INJECTION 3.5 MG (2.5MG/ML)
1.3000 mg/m2 | Freq: Once | INTRAMUSCULAR | Status: AC
  Administered 2014-12-23: 2.75 mg via SUBCUTANEOUS
  Filled 2014-12-23: qty 2.75

## 2014-12-23 MED ORDER — ACYCLOVIR 400 MG PO TABS: 400.0000 mg | ORAL_TABLET | Freq: Every day | ORAL | Status: DC

## 2014-12-23 MED ORDER — ONDANSETRON HCL 8 MG PO TABS
ORAL_TABLET | ORAL | Status: AC
  Filled 2014-12-23: qty 1

## 2014-12-23 NOTE — Progress Notes (Signed)
OK to Treat with platelets 95 and Creatinie 6.2 per Ned Card.    Inbasket sent to Carmelina Noun for pre-auth.

## 2014-12-23 NOTE — Progress Notes (Signed)
  [name] OFFICE PROGRESS NOTE   DIAGNOSIS:  #1 plasma cell dyscrasia, multiple myeloma.  #2 anemia of chronic disease secondary to chronic kidney disease.   PRIOR THERAPY: Treatment with single agent subcutaneous Velcade 1.3 mg/m2 on a weekly basis with Decadron 40 mg by mouth weekly, status post 34 cycles. Last dose was given on 07/27/2014. Myeloma panel 11/23/2014 with evidence of disease progression.   CURRENT THERAPY: Velcade/dexamethasone resumed 12/02/2014 (cycle 35)     INTERVAL HISTORY:   [name] returns as scheduled. She continues weekly Velcade/dexamethasone. She denies nausea/vomiting. No mouth sores. Bowels moving. She has intermittent abdominal cramps. She had similar cramps when she was previously treated with Velcade. She began a stool softener and the cramps resolved. She plans on resuming the stool softener. She describes her appetite as "so-so". She has occasional mild dyspnea on exertion. No cough or fever.  Objective:  Vital signs in last 24 hours:  Blood pressure 151/83, pulse 78, temperature 98.3 F (36.8 C), temperature source Oral, resp. rate 18, height 5' 5.5" (1.664 m), weight 206 lb 4.8 oz (93.577 kg), SpO2 98 %.    HEENT: No thrush or ulcers. Lymphatics: No palpable cervical or supra-clavicular lymph nodes. Resp: Lungs clear bilaterally. Cardio: Regular rate and rhythm. GI: Abdomen soft and nontender. No organomegaly. Vascular: No leg edema. Calves soft and nontender.    Lab Results:  Lab Results  Component Value Date   WBC 9.1 12/23/2014   HGB 11.6 12/23/2014   HCT 36.1 12/23/2014   MCV 95.4 12/23/2014   PLT 95* 12/23/2014   NEUTROABS 7.1* 12/23/2014    Imaging:  No results found.  Medications: I have reviewed the patient's current medications.  Assessment/Plan: 1. Plasma cell dyscrasia/multiple myeloma. Myeloma panel with evidence of disease progression 11/23/2014. Treatment resumed with weekly Velcade and  dexamethasone 12/02/2014.  2. Anemia secondary to chronic kidney disease. 3. ESRD now on dialysis.   Disposition: [name] appears stable. Plan to continue weekly Velcade/dexamethasone. She will return for a follow-up visit in 3 weeks.  Plan reviewed with Dr. Julien Nordmann.    Ned Card ANP/GNP-BC   12/23/2014  10:24 AM

## 2014-12-29 ENCOUNTER — Other Ambulatory Visit: Payer: Self-pay | Admitting: Medical Oncology

## 2014-12-29 DIAGNOSIS — C9000 Multiple myeloma not having achieved remission: Secondary | ICD-10-CM

## 2014-12-30 ENCOUNTER — Ambulatory Visit (HOSPITAL_BASED_OUTPATIENT_CLINIC_OR_DEPARTMENT_OTHER): Payer: Medicare Other

## 2014-12-30 ENCOUNTER — Other Ambulatory Visit (HOSPITAL_BASED_OUTPATIENT_CLINIC_OR_DEPARTMENT_OTHER): Payer: Medicare Other

## 2014-12-30 VITALS — BP 123/51 | HR 69 | Temp 98.2°F

## 2014-12-30 DIAGNOSIS — C9000 Multiple myeloma not having achieved remission: Secondary | ICD-10-CM

## 2014-12-30 DIAGNOSIS — Z5112 Encounter for antineoplastic immunotherapy: Secondary | ICD-10-CM | POA: Diagnosis not present

## 2014-12-30 LAB — COMPREHENSIVE METABOLIC PANEL (CC13)
ALT: 11 U/L (ref 0–55)
ANION GAP: 11 meq/L (ref 3–11)
AST: 13 U/L (ref 5–34)
Albumin: 3.1 g/dL — ABNORMAL LOW (ref 3.5–5.0)
Alkaline Phosphatase: 93 U/L (ref 40–150)
BUN: 25.6 mg/dL (ref 7.0–26.0)
CALCIUM: 8.6 mg/dL (ref 8.4–10.4)
CO2: 32 meq/L — AB (ref 22–29)
Chloride: 99 mEq/L (ref 98–109)
Creatinine: 5.8 mg/dL (ref 0.6–1.1)
EGFR: 7 mL/min/{1.73_m2} — AB (ref >90)
Glucose: 241 mg/dl — ABNORMAL HIGH (ref 70–140)
Potassium: 4.4 mEq/L (ref 3.5–5.1)
SODIUM: 141 meq/L (ref 136–145)
Total Bilirubin: 0.4 mg/dL (ref 0.20–1.20)
Total Protein: 6.6 g/dL (ref 6.4–8.3)

## 2014-12-30 LAB — CBC WITH DIFFERENTIAL/PLATELET
BASO%: 0.4 % (ref 0.0–2.0)
Basophils Absolute: 0 10*3/uL (ref 0.0–0.1)
EOS%: 1.4 % (ref 0.0–7.0)
Eosinophils Absolute: 0.1 10*3/uL (ref 0.0–0.5)
HEMATOCRIT: 34.7 % — AB (ref 34.8–46.6)
HGB: 11 g/dL — ABNORMAL LOW (ref 11.6–15.9)
LYMPH%: 9.2 % — ABNORMAL LOW (ref 14.0–49.7)
MCH: 30.1 pg (ref 25.1–34.0)
MCHC: 31.7 g/dL (ref 31.5–36.0)
MCV: 95 fL (ref 79.5–101.0)
MONO#: 0.7 10*3/uL (ref 0.1–0.9)
MONO%: 7.7 % (ref 0.0–14.0)
NEUT%: 81.3 % — ABNORMAL HIGH (ref 38.4–76.8)
NEUTROS ABS: 7.1 10*3/uL — AB (ref 1.5–6.5)
Platelets: 89 10*3/uL — ABNORMAL LOW (ref 145–400)
RBC: 3.65 10*6/uL — AB (ref 3.70–5.45)
RDW: 18.7 % — ABNORMAL HIGH (ref 11.2–14.5)
WBC: 8.8 10*3/uL (ref 3.9–10.3)
lymph#: 0.8 10*3/uL — ABNORMAL LOW (ref 0.9–3.3)

## 2014-12-30 MED ORDER — ONDANSETRON HCL 8 MG PO TABS
8.0000 mg | ORAL_TABLET | Freq: Once | ORAL | Status: AC
  Administered 2014-12-30: 8 mg via ORAL

## 2014-12-30 MED ORDER — ONDANSETRON HCL 8 MG PO TABS
ORAL_TABLET | ORAL | Status: AC
  Filled 2014-12-30: qty 1

## 2014-12-30 MED ORDER — BORTEZOMIB CHEMO SQ INJECTION 3.5 MG (2.5MG/ML)
1.3000 mg/m2 | Freq: Once | INTRAMUSCULAR | Status: AC
  Administered 2014-12-30: 2.75 mg via SUBCUTANEOUS
  Filled 2014-12-30: qty 2.75

## 2014-12-30 NOTE — Patient Instructions (Signed)
[facility] Discharge Instructions for Patients Receiving Chemotherapy  Today you received the following chemotherapy agents:  Velcade  To help prevent nausea and vomiting after your treatment, we encourage you to take your nausea medication as prescribed.   If you develop nausea and vomiting that is not controlled by your nausea medication, call the clinic.   BELOW ARE SYMPTOMS THAT SHOULD BE REPORTED IMMEDIATELY:  *FEVER GREATER THAN 100.5 F  *CHILLS WITH OR WITHOUT FEVER  NAUSEA AND VOMITING THAT IS NOT CONTROLLED WITH YOUR NAUSEA MEDICATION  *UNUSUAL SHORTNESS OF BREATH  *UNUSUAL BRUISING OR BLEEDING  TENDERNESS IN MOUTH AND THROAT WITH OR WITHOUT PRESENCE OF ULCERS  *URINARY PROBLEMS  *BOWEL PROBLEMS  UNUSUAL RASH Items with * indicate a potential emergency and should be followed up as soon as possible.  Feel free to call the clinic you have any questions or concerns. The clinic phone number is [phone].  Please show the CHEMO ALERT CARD at check-in to the Emergency Department and triage nurse.   

## 2014-12-30 NOTE — Progress Notes (Signed)
Ok to treat per Dr. Mohamed. 

## 2014-12-31 ENCOUNTER — Other Ambulatory Visit: Payer: Self-pay | Admitting: *Deleted

## 2014-12-31 DIAGNOSIS — N186 End stage renal disease: Secondary | ICD-10-CM

## 2014-12-31 DIAGNOSIS — Z01810 Encounter for preprocedural cardiovascular examination: Secondary | ICD-10-CM

## 2015-01-05 ENCOUNTER — Other Ambulatory Visit: Payer: Self-pay | Admitting: Medical Oncology

## 2015-01-05 DIAGNOSIS — C9000 Multiple myeloma not having achieved remission: Secondary | ICD-10-CM

## 2015-01-06 ENCOUNTER — Other Ambulatory Visit (HOSPITAL_BASED_OUTPATIENT_CLINIC_OR_DEPARTMENT_OTHER): Payer: Medicare Other

## 2015-01-06 ENCOUNTER — Ambulatory Visit (HOSPITAL_BASED_OUTPATIENT_CLINIC_OR_DEPARTMENT_OTHER): Payer: Medicare Other

## 2015-01-06 VITALS — BP 129/60 | HR 68 | Temp 98.8°F

## 2015-01-06 DIAGNOSIS — Z5112 Encounter for antineoplastic immunotherapy: Secondary | ICD-10-CM | POA: Diagnosis not present

## 2015-01-06 DIAGNOSIS — C9000 Multiple myeloma not having achieved remission: Secondary | ICD-10-CM | POA: Diagnosis not present

## 2015-01-06 LAB — COMPREHENSIVE METABOLIC PANEL (CC13)
ALT: 13 U/L (ref 0–55)
AST: 13 U/L (ref 5–34)
Albumin: 3.1 g/dL — ABNORMAL LOW (ref 3.5–5.0)
Alkaline Phosphatase: 108 U/L (ref 40–150)
Anion Gap: 13 mEq/L — ABNORMAL HIGH (ref 3–11)
BUN: 33.2 mg/dL — AB (ref 7.0–26.0)
CO2: 27 meq/L (ref 22–29)
Calcium: 8.4 mg/dL (ref 8.4–10.4)
Chloride: 99 mEq/L (ref 98–109)
Creatinine: 6.8 mg/dL (ref 0.6–1.1)
EGFR: 6 mL/min/{1.73_m2} — AB (ref >90)
Glucose: 128 mg/dl (ref 70–140)
POTASSIUM: 4.7 meq/L (ref 3.5–5.1)
Sodium: 139 mEq/L (ref 136–145)
Total Bilirubin: 0.34 mg/dL (ref 0.20–1.20)
Total Protein: 6.8 g/dL (ref 6.4–8.3)

## 2015-01-06 LAB — CBC WITH DIFFERENTIAL/PLATELET
BASO%: 0.2 % (ref 0.0–2.0)
Basophils Absolute: 0 10*3/uL (ref 0.0–0.1)
EOS%: 0.9 % (ref 0.0–7.0)
Eosinophils Absolute: 0.1 10*3/uL (ref 0.0–0.5)
HCT: 35.7 % (ref 34.8–46.6)
HGB: 11.6 g/dL (ref 11.6–15.9)
LYMPH%: 7.2 % — ABNORMAL LOW (ref 14.0–49.7)
MCH: 30.4 pg (ref 25.1–34.0)
MCHC: 32.5 g/dL (ref 31.5–36.0)
MCV: 93.5 fL (ref 79.5–101.0)
MONO#: 0.4 10*3/uL (ref 0.1–0.9)
MONO%: 3.5 % (ref 0.0–14.0)
NEUT#: 8.9 10*3/uL — ABNORMAL HIGH (ref 1.5–6.5)
NEUT%: 88.2 % — AB (ref 38.4–76.8)
Platelets: 100 10*3/uL — ABNORMAL LOW (ref 145–400)
RBC: 3.82 10*6/uL (ref 3.70–5.45)
RDW: 17.9 % — AB (ref 11.2–14.5)
WBC: 10.1 10*3/uL (ref 3.9–10.3)
lymph#: 0.7 10*3/uL — ABNORMAL LOW (ref 0.9–3.3)

## 2015-01-06 MED ORDER — ONDANSETRON HCL 8 MG PO TABS
ORAL_TABLET | ORAL | Status: AC
  Filled 2015-01-06: qty 1

## 2015-01-06 MED ORDER — BORTEZOMIB CHEMO SQ INJECTION 3.5 MG (2.5MG/ML)
1.3000 mg/m2 | Freq: Once | INTRAMUSCULAR | Status: AC
  Administered 2015-01-06: 2.75 mg via SUBCUTANEOUS
  Filled 2015-01-06: qty 2.75

## 2015-01-06 MED ORDER — ONDANSETRON HCL 8 MG PO TABS
8.0000 mg | ORAL_TABLET | Freq: Once | ORAL | Status: AC
  Administered 2015-01-06: 8 mg via ORAL

## 2015-01-06 NOTE — Progress Notes (Signed)
Per Dr. Julien Nordmann- ok to treat with Cr. 6.8

## 2015-01-10 ENCOUNTER — Telehealth: Payer: Self-pay | Admitting: Vascular Surgery

## 2015-01-10 NOTE — Telephone Encounter (Signed)
No cancellations so far. Pt has requested CSD only- limits ability to move up. dpm

## 2015-01-10 NOTE — Telephone Encounter (Signed)
-----   Message from Denman George, RN sent at 01/06/2015  2:41 PM EDT ----- Regarding: requests earlier appt. This pt. called and inquired about getting an earlier appt. than 8/24;  Reported kidney ctr. is having problems with dialysis catheter; I verified with the nurse @ [facility] that there has been some problems with flow through the DC, during tx.  Is there anything avail. to move pt's appt. to an earlier date?  She dialyzes M-W-F.  Thanks.

## 2015-01-13 ENCOUNTER — Encounter: Payer: Self-pay | Admitting: Internal Medicine

## 2015-01-13 ENCOUNTER — Other Ambulatory Visit (HOSPITAL_BASED_OUTPATIENT_CLINIC_OR_DEPARTMENT_OTHER): Payer: Medicare Other

## 2015-01-13 ENCOUNTER — Ambulatory Visit (HOSPITAL_BASED_OUTPATIENT_CLINIC_OR_DEPARTMENT_OTHER): Payer: Medicare Other | Admitting: Internal Medicine

## 2015-01-13 ENCOUNTER — Other Ambulatory Visit: Payer: Self-pay | Admitting: Internal Medicine

## 2015-01-13 ENCOUNTER — Telehealth: Payer: Self-pay | Admitting: Internal Medicine

## 2015-01-13 ENCOUNTER — Ambulatory Visit (HOSPITAL_BASED_OUTPATIENT_CLINIC_OR_DEPARTMENT_OTHER): Payer: Medicare Other

## 2015-01-13 VITALS — BP 127/66 | HR 74 | Temp 99.2°F | Resp 18 | Ht 65.5 in | Wt 208.6 lb

## 2015-01-13 DIAGNOSIS — N189 Chronic kidney disease, unspecified: Secondary | ICD-10-CM | POA: Diagnosis not present

## 2015-01-13 DIAGNOSIS — Z5112 Encounter for antineoplastic immunotherapy: Secondary | ICD-10-CM | POA: Diagnosis not present

## 2015-01-13 DIAGNOSIS — D631 Anemia in chronic kidney disease: Secondary | ICD-10-CM | POA: Diagnosis not present

## 2015-01-13 DIAGNOSIS — C9000 Multiple myeloma not having achieved remission: Secondary | ICD-10-CM | POA: Diagnosis not present

## 2015-01-13 DIAGNOSIS — N186 End stage renal disease: Secondary | ICD-10-CM

## 2015-01-13 LAB — COMPREHENSIVE METABOLIC PANEL (CC13)
ALBUMIN: 3.2 g/dL — AB (ref 3.5–5.0)
ALT: 13 U/L (ref 0–55)
ANION GAP: 13 meq/L — AB (ref 3–11)
AST: 14 U/L (ref 5–34)
Alkaline Phosphatase: 104 U/L (ref 40–150)
BUN: 43 mg/dL — ABNORMAL HIGH (ref 7.0–26.0)
CALCIUM: 8.4 mg/dL (ref 8.4–10.4)
CO2: 25 meq/L (ref 22–29)
Chloride: 100 mEq/L (ref 98–109)
Creatinine: 6.7 mg/dL (ref 0.6–1.1)
EGFR: 6 mL/min/{1.73_m2} — AB (ref >90)
GLUCOSE: 240 mg/dL — AB (ref 70–140)
Potassium: 4.6 mEq/L (ref 3.5–5.1)
Sodium: 137 mEq/L (ref 136–145)
TOTAL PROTEIN: 6.9 g/dL (ref 6.4–8.3)
Total Bilirubin: 0.4 mg/dL (ref 0.20–1.20)

## 2015-01-13 LAB — CBC WITH DIFFERENTIAL/PLATELET
BASO%: 0.6 % (ref 0.0–2.0)
Basophils Absolute: 0.1 10*3/uL (ref 0.0–0.1)
EOS%: 1.2 % (ref 0.0–7.0)
Eosinophils Absolute: 0.1 10*3/uL (ref 0.0–0.5)
HEMATOCRIT: 37.1 % (ref 34.8–46.6)
HGB: 11.7 g/dL (ref 11.6–15.9)
LYMPH%: 11.1 % — ABNORMAL LOW (ref 14.0–49.7)
MCH: 30.2 pg (ref 25.1–34.0)
MCHC: 31.6 g/dL (ref 31.5–36.0)
MCV: 95.6 fL (ref 79.5–101.0)
MONO#: 0.7 10*3/uL (ref 0.1–0.9)
MONO%: 7.7 % (ref 0.0–14.0)
NEUT#: 7.2 10*3/uL — ABNORMAL HIGH (ref 1.5–6.5)
NEUT%: 79.4 % — AB (ref 38.4–76.8)
Platelets: 116 10*3/uL — ABNORMAL LOW (ref 145–400)
RBC: 3.88 10*6/uL (ref 3.70–5.45)
RDW: 19.9 % — ABNORMAL HIGH (ref 11.2–14.5)
WBC: 9 10*3/uL (ref 3.9–10.3)
lymph#: 1 10*3/uL (ref 0.9–3.3)

## 2015-01-13 MED ORDER — BORTEZOMIB CHEMO SQ INJECTION 3.5 MG (2.5MG/ML)
1.3000 mg/m2 | Freq: Once | INTRAMUSCULAR | Status: AC
  Administered 2015-01-13: 2.75 mg via SUBCUTANEOUS
  Filled 2015-01-13: qty 2.75

## 2015-01-13 MED ORDER — ONDANSETRON HCL 8 MG PO TABS
ORAL_TABLET | ORAL | Status: AC
  Filled 2015-01-13: qty 1

## 2015-01-13 MED ORDER — ONDANSETRON HCL 8 MG PO TABS
8.0000 mg | ORAL_TABLET | Freq: Once | ORAL | Status: AC
  Administered 2015-01-13: 8 mg via ORAL

## 2015-01-13 NOTE — Patient Instructions (Signed)
[facility] Discharge Instructions for Patients Receiving Chemotherapy  Today you received the following chemotherapy agents:  Velcade  To help prevent nausea and vomiting after your treatment, we encourage you to take your nausea medication as prescribed.   If you develop nausea and vomiting that is not controlled by your nausea medication, call the clinic.   BELOW ARE SYMPTOMS THAT SHOULD BE REPORTED IMMEDIATELY:  *FEVER GREATER THAN 100.5 F  *CHILLS WITH OR WITHOUT FEVER  NAUSEA AND VOMITING THAT IS NOT CONTROLLED WITH YOUR NAUSEA MEDICATION  *UNUSUAL SHORTNESS OF BREATH  *UNUSUAL BRUISING OR BLEEDING  TENDERNESS IN MOUTH AND THROAT WITH OR WITHOUT PRESENCE OF ULCERS  *URINARY PROBLEMS  *BOWEL PROBLEMS  UNUSUAL RASH Items with * indicate a potential emergency and should be followed up as soon as possible.  Feel free to call the clinic you have any questions or concerns. The clinic phone number is [phone].  Please show the CHEMO ALERT CARD at check-in to the Emergency Department and triage nurse.   

## 2015-01-13 NOTE — Progress Notes (Signed)
Verbal consent given by Dr. Julien Nordmann to treat pt with Creat level of 6.7

## 2015-01-13 NOTE — Progress Notes (Signed)
[place] [phone]   [phone]  OFFICE PROGRESS NOTE  [name], MD [place] [address]  DIAGNOSIS:  #1 plasma cell dyscrasia, multiple myeloma.  #2 anemia of chronic disease secondary to chronic kidney disease.   PRIOR THERAPY: Treatment with single agent subcutaneous Velcade 1.3 mg/m2 on a weekly basis with Decadron 40 mg by mouth weekly, status post 40 cycles. Last dose was given on 07/27/2014.  CURRENT THERAPY: Observation.   INTERVAL HISTORY: [name] 81 y.o. female returns to the clinic today for followup visit accompanied by her daughter. The patient continues her hemodialysis on Monday, Wednesday and Friday. She still has some issues with her IV access for the dialysis. She is tolerating her current treatment with subcutaneous Velcade and Decadron fairly well. She denied having any fever or chills, no nausea or vomiting. The patient denied having any significant chest pain, shortness of breath, cough or hemoptysis. She has no significant weight loss or night sweats. She is here today to start cycle #41.  MEDICAL HISTORY: Past Medical History  Diagnosis Date  . Hypertension   . Asthma   . GERD (gastroesophageal reflux disease)   . Neuropathy in diabetes     Hx: of  . COPD (chronic obstructive pulmonary disease)   . Anemia   . High cholesterol   . Pulmonary embolism 1987  . Type II diabetes mellitus   . History of blood transfusion 1987; 2011    "related to lung OR; HgB went down to 5"  . Arthritis     "hips, knees, shoulders" (11/10/2013)  . Chronic lower back pain   . Gout   . Multiple myeloma   . Neuropathy   . Hypothyroidism   . ESRD (end stage renal disease) on dialysis     "1st treatment 11/09/2013" MWF  . Kidney stones     ALLERGIES:  is allergic to penicillins cross reactors.  MEDICATIONS:  Current Outpatient Prescriptions  Medication Sig Dispense Refill  .  acyclovir (ZOVIRAX) 400 MG tablet Take 1 tablet (400 mg total) by mouth daily. 30 tablet 0  . albuterol (PROVENTIL HFA;VENTOLIN HFA) 108 (90 BASE) MCG/ACT inhaler Inhale 1 puff into the lungs 2 (two) times daily as needed for wheezing or shortness of breath.    . allopurinol (ZYLOPRIM) 100 MG tablet Take 100 mg by mouth daily with breakfast.     . amLODipine (NORVASC) 5 MG tablet Take 5 mg by mouth daily with breakfast. Patient states she only takes medication on days that she does not have dialysis which is 4 days out of the week. Receives dialysis on Monday, Wednesday and Fridays.    . B-D ULTRAFINE III SHORT PEN 31G X 8 MM MISC   4  . Cyanocobalamin (VITAMIN B-12 PO) Take 1 tablet by mouth daily.    . cyclobenzaprine (FLEXERIL) 5 MG tablet Take 5 mg by mouth 3 (three) times daily as needed for muscle spasms.   0  . dexamethasone (DECADRON) 4 MG tablet Take 10 tablets (40 mg total) by mouth once a week. 40 tablet 0  . gabapentin (NEURONTIN) 100 MG capsule Take 100 mg by mouth 3 (three) times daily.    Marland Kitchen glimepiride (AMARYL) 1 MG tablet Take 1 mg by mouth daily with breakfast.     . HYDROcodone-acetaminophen (NORCO) 7.5-325 MG per tablet Take 1 tablet by mouth every 6 (six) hours as needed for moderate pain.    Marland Kitchen  hydrOXYzine (ATARAX/VISTARIL) 25 MG tablet Take 25 mg by mouth every 12 (twelve) hours as needed for itching.   2  . LEVEMIR FLEXTOUCH 100 UNIT/ML Pen Inject 12 Units into the skin daily as needed (high blood sugar). If Blood sugar is above 110 pt will take  5  . levothyroxine (SYNTHROID, LEVOTHROID) 100 MCG tablet Take 100 mcg by mouth daily.  4  . lidocaine-prilocaine (EMLA) cream Apply 1 application topically as needed (for numbing).     . multivitamin (RENA-VIT) TABS tablet Take 1 tablet by mouth daily.    . ondansetron (ZOFRAN) 8 MG tablet Take 1 tablet (8 mg total) by mouth every 8 (eight) hours as needed for nausea or vomiting. 20 tablet 1  . ONE TOUCH ULTRA TEST test strip     .  Oxycodone HCl 20 MG TABS Take 20 mg by mouth every 12 (twelve) hours.     Marland Kitchen oxyCODONE-acetaminophen (ROXICET) 5-325 MG per tablet Take 1-2 tablets by mouth every 4 (four) hours as needed for severe pain. 30 tablet 0  . pantoprazole (PROTONIX) 40 MG tablet Take 40 mg by mouth 2 (two) times daily.     . saxagliptin HCl (ONGLYZA) 2.5 MG TABS tablet Take 2.5 mg by mouth daily with breakfast.      No current facility-administered medications for this visit.    SURGICAL HISTORY:  Past Surgical History  Procedure Laterality Date  . Lung removal, partial Left 1987    "blood clot"  . Cholecystectomy    . Av fistula placement Left 2011    "lower arm; never used"  . Colonoscopy    . Abdominoplasty  ~ 1983    "tummy tuck"  . Abdominal hysterectomy  1964  . Av fistula placement Left 05/15/2013    Procedure: INSERTION OF ARTERIOVENOUS (AV) GORE-TEX GRAFT ARM-LEFT UPPER ARM;  Surgeon: Pryor Ochoa, MD;  Location: [facility] OR;  Service: Vascular;  Laterality: Left;  . Appendectomy  1954  . Umbilical hernia repair  ~ 1983  . Tubal ligation  1964  . Cystoscopy w/ stone manipulation  1980's  . Hernia repair      umbicical hernia  . Eye surgery Bilateral 2016    CAtaract-   . Av fistula placement Right 10/19/2014    Procedure: INSERTION OF ARTERIOVENOUS (AV) 4-67mm x 45cm GORE-TEX GRAFT RIGHT FOREARM;  Surgeon: Chuck Hint, MD;  Location: [facility] OR;  Service: Vascular;  Laterality: Right;    REVIEW OF SYSTEMS:  A comprehensive review of systems was negative except for: Constitutional: positive for fatigue Musculoskeletal: positive for arthralgias and myalgias   PHYSICAL EXAMINATION: General appearance: alert, cooperative, fatigued and no distress Head: Normocephalic, without obvious abnormality, atraumatic Neck: no adenopathy Lymph nodes: Cervical, supraclavicular, and axillary nodes normal. Resp: clear to auscultation bilaterally Back: symmetric, no curvature. ROM normal. No CVA  tenderness. Cardio: regular rate and rhythm, S1, S2 normal, no murmur, click, rub or gallop GI: soft, non-tender; bowel sounds normal; no masses,  no organomegaly Extremities: extremities normal, atraumatic, no cyanosis or edema Neurologic: Alert and oriented X 3, normal strength and tone. Normal symmetric reflexes. Normal coordination and gait  ECOG PERFORMANCE STATUS: 2 - Symptomatic, <50% confined to bed  There were no vitals taken for this visit.  LABORATORY DATA: Lab Results  Component Value Date   WBC 10.1 01/06/2015   HGB 11.6 01/06/2015   HCT 35.7 01/06/2015   MCV 93.5 01/06/2015   PLT 100* 01/06/2015      Chemistry  Component Value Date/Time   NA 139 01/06/2015 0834   NA 134* 10/19/2014 0855   K 4.7 01/06/2015 0834   K 5.1 10/19/2014 0855   CL 98 11/10/2013 0748   CL 98 09/17/2012 1242   CO2 27 01/06/2015 0834   CO2 30 11/10/2013 0748   BUN 33.2* 01/06/2015 0834   BUN 80* 11/10/2013 0748   CREATININE 6.8 Repeated and Verified* 01/06/2015 0834   CREATININE 3.66* 11/10/2013 0748      Component Value Date/Time   CALCIUM 8.4 01/06/2015 0834   CALCIUM 9.5 11/10/2013 0748   CALCIUM 8.6 12/02/2012 0933   ALKPHOS 108 01/06/2015 0834   ALKPHOS 81 03/26/2012 1643   AST 13 01/06/2015 0834   AST 23 03/26/2012 1643   ALT 13 01/06/2015 0834   ALT 14 03/26/2012 1643   BILITOT 0.34 01/06/2015 0834   BILITOT 0.2* 03/26/2012 1643     Other lab results: Beta-2 microglobulin 29.60. Free kappa light chain 18.80, free lambda light chain 19.10, kappa/lambda ratio 0.81. IgG 1550, IgA 160 and IgM 111.  ASSESSMENT: This is a very pleasant 81 years old [place] American female with plasma cell dyscrasia completed treatment with weekly subcutaneous Velcade and Decadron status post 34 weekly doses and tolerating it fairly well  I recommended for the patient to continue her weekly treatment with subcutaneous Velcade and Decadron as scheduled. She will start cycle #41 today. She  will come back for follow-up visit in 3 weeks after repeating myeloma panel for reevaluation of her disease. She was advised to call immediately if she has any concerning symptoms in the interval.  Disclaimer: This note was dictated with voice recognition software. Similar sounding words can inadvertently be transcribed and may not be corrected upon review.

## 2015-01-13 NOTE — Telephone Encounter (Signed)
Pt confirmed labs/ov per 08/11 POF, gave pt avs and calendar.... KJ °

## 2015-01-13 NOTE — Progress Notes (Signed)
Faxed application renewal and supporting documents to [facility]. Fax confirmation received.

## 2015-01-14 ENCOUNTER — Telehealth: Payer: Self-pay | Admitting: *Deleted

## 2015-01-14 NOTE — Telephone Encounter (Signed)
Per staff message and POF I have scheduled appts. Advised scheduler of appts. JMW  

## 2015-01-17 ENCOUNTER — Other Ambulatory Visit: Payer: Self-pay | Admitting: Nurse Practitioner

## 2015-01-18 ENCOUNTER — Other Ambulatory Visit: Payer: Self-pay | Admitting: Nurse Practitioner

## 2015-01-20 ENCOUNTER — Other Ambulatory Visit: Payer: Self-pay | Admitting: Medical Oncology

## 2015-01-20 ENCOUNTER — Ambulatory Visit (HOSPITAL_BASED_OUTPATIENT_CLINIC_OR_DEPARTMENT_OTHER): Payer: Medicare Other

## 2015-01-20 ENCOUNTER — Other Ambulatory Visit: Payer: Self-pay | Admitting: *Deleted

## 2015-01-20 ENCOUNTER — Other Ambulatory Visit: Payer: Self-pay | Admitting: Internal Medicine

## 2015-01-20 ENCOUNTER — Other Ambulatory Visit: Payer: Medicare Other

## 2015-01-20 VITALS — BP 85/44 | HR 56 | Temp 98.4°F | Resp 18

## 2015-01-20 DIAGNOSIS — C9000 Multiple myeloma not having achieved remission: Secondary | ICD-10-CM

## 2015-01-20 DIAGNOSIS — Z5112 Encounter for antineoplastic immunotherapy: Secondary | ICD-10-CM | POA: Diagnosis not present

## 2015-01-20 MED ORDER — ONDANSETRON HCL 8 MG PO TABS
ORAL_TABLET | ORAL | Status: AC
Start: 2015-01-20 — End: 2015-01-20
  Filled 2015-01-20: qty 1

## 2015-01-20 MED ORDER — ONDANSETRON HCL 8 MG PO TABS
8.0000 mg | ORAL_TABLET | Freq: Once | ORAL | Status: AC
  Administered 2015-01-20: 8 mg via ORAL

## 2015-01-20 MED ORDER — DEXAMETHASONE 4 MG PO TABS: 40.0000 mg | ORAL_TABLET | ORAL | Status: DC

## 2015-01-20 MED ORDER — BORTEZOMIB CHEMO SQ INJECTION 3.5 MG (2.5MG/ML)
1.3000 mg/m2 | Freq: Once | INTRAMUSCULAR | Status: AC
  Administered 2015-01-20: 2.75 mg via SUBCUTANEOUS
  Filled 2015-01-20: qty 2.75

## 2015-01-20 NOTE — Progress Notes (Signed)
Okay to treatment with labs last week per Dr. Julien Nordmann. Patient presented today in infusion with no labs- they were unable to obtain.  BP 85/44, pulse 56.  Orthostatic BP's obtained.  Patient c/o fatigue and pain 8/10 in lower back and right knee- took pain meds prior to leaving home this morning.  Dr. Julien Nordmann aware, continue with treatment.

## 2015-01-20 NOTE — Patient Instructions (Signed)
Cancer Center Discharge Instructions for Patients Receiving Chemotherapy  Today you received the following chemotherapy agents velcade.  To help prevent nausea and vomiting after your treatment, we encourage you to take your nausea medication .   If you develop nausea and vomiting that is not controlled by your nausea medication, call the clinic.   BELOW ARE SYMPTOMS THAT SHOULD BE REPORTED IMMEDIATELY:  *FEVER GREATER THAN 100.5 F  *CHILLS WITH OR WITHOUT FEVER  NAUSEA AND VOMITING THAT IS NOT CONTROLLED WITH YOUR NAUSEA MEDICATION  *UNUSUAL SHORTNESS OF BREATH  *UNUSUAL BRUISING OR BLEEDING  TENDERNESS IN MOUTH AND THROAT WITH OR WITHOUT PRESENCE OF ULCERS  *URINARY PROBLEMS  *BOWEL PROBLEMS  UNUSUAL RASH Items with * indicate a potential emergency and should be followed up as soon as possible.  Feel free to call the clinic you have any questions or concerns. The clinic phone number is [phone].  Please show the CHEMO ALERT CARD at check-in to the Emergency Department and triage nurse.   

## 2015-01-23 ENCOUNTER — Encounter (HOSPITAL_COMMUNITY): Payer: Self-pay | Admitting: Emergency Medicine

## 2015-01-23 ENCOUNTER — Emergency Department (HOSPITAL_COMMUNITY)
Admission: EM | Admit: 2015-01-23 | Discharge: 2015-01-23 | Disposition: A | Discharge status: Home / Self Care | Payer: Medicare Other | Attending: Emergency Medicine | Admitting: Emergency Medicine

## 2015-01-23 ENCOUNTER — Other Ambulatory Visit: Payer: Self-pay

## 2015-01-23 ENCOUNTER — Emergency Department (HOSPITAL_COMMUNITY): Payer: Medicare Other

## 2015-01-23 DIAGNOSIS — I120 Hypertensive chronic kidney disease with stage 5 chronic kidney disease or end stage renal disease: Secondary | ICD-10-CM | POA: Diagnosis not present

## 2015-01-23 DIAGNOSIS — Z87442 Personal history of urinary calculi: Secondary | ICD-10-CM | POA: Insufficient documentation

## 2015-01-23 DIAGNOSIS — J449 Chronic obstructive pulmonary disease, unspecified: Secondary | ICD-10-CM | POA: Insufficient documentation

## 2015-01-23 DIAGNOSIS — R0789 Other chest pain: Secondary | ICD-10-CM

## 2015-01-23 DIAGNOSIS — Z862 Personal history of diseases of the blood and blood-forming organs and certain disorders involving the immune mechanism: Secondary | ICD-10-CM | POA: Insufficient documentation

## 2015-01-23 DIAGNOSIS — E039 Hypothyroidism, unspecified: Secondary | ICD-10-CM | POA: Insufficient documentation

## 2015-01-23 DIAGNOSIS — M1990 Unspecified osteoarthritis, unspecified site: Secondary | ICD-10-CM | POA: Insufficient documentation

## 2015-01-23 DIAGNOSIS — N186 End stage renal disease: Secondary | ICD-10-CM | POA: Diagnosis not present

## 2015-01-23 DIAGNOSIS — G8929 Other chronic pain: Secondary | ICD-10-CM | POA: Diagnosis not present

## 2015-01-23 DIAGNOSIS — Z7952 Long term (current) use of systemic steroids: Secondary | ICD-10-CM | POA: Insufficient documentation

## 2015-01-23 DIAGNOSIS — Z86711 Personal history of pulmonary embolism: Secondary | ICD-10-CM | POA: Insufficient documentation

## 2015-01-23 DIAGNOSIS — E782 Mixed hyperlipidemia: Secondary | ICD-10-CM | POA: Diagnosis not present

## 2015-01-23 DIAGNOSIS — Z79899 Other long term (current) drug therapy: Secondary | ICD-10-CM | POA: Diagnosis not present

## 2015-01-23 DIAGNOSIS — Z880 Allergy status to penicillin: Secondary | ICD-10-CM | POA: Insufficient documentation

## 2015-01-23 DIAGNOSIS — K219 Gastro-esophageal reflux disease without esophagitis: Secondary | ICD-10-CM | POA: Diagnosis not present

## 2015-01-23 DIAGNOSIS — R079 Chest pain, unspecified: Secondary | ICD-10-CM | POA: Diagnosis present

## 2015-01-23 DIAGNOSIS — Z992 Dependence on renal dialysis: Secondary | ICD-10-CM | POA: Insufficient documentation

## 2015-01-23 LAB — I-STAT TROPONIN, ED
TROPONIN I, POC: 0.01 ng/mL (ref 0.00–0.08)
Troponin i, poc: 0.01 ng/mL (ref 0.00–0.08)

## 2015-01-23 LAB — CBC WITH DIFFERENTIAL/PLATELET
BASOS ABS: 0 10*3/uL (ref 0.0–0.1)
BASOS PCT: 0 % (ref 0–1)
EOS ABS: 0.1 10*3/uL (ref 0.0–0.7)
EOS PCT: 1 % (ref 0–5)
HCT: 36.8 % (ref 36.0–46.0)
HEMOGLOBIN: 12 g/dL (ref 12.0–15.0)
Lymphocytes Relative: 14 % (ref 12–46)
Lymphs Abs: 1.4 10*3/uL (ref 0.7–4.0)
MCH: 29.7 pg (ref 26.0–34.0)
MCHC: 32.6 g/dL (ref 30.0–36.0)
MCV: 91.1 fL (ref 78.0–100.0)
Monocytes Absolute: 0.8 10*3/uL (ref 0.1–1.0)
Monocytes Relative: 8 % (ref 3–12)
NEUTROS PCT: 78 % — AB (ref 43–77)
Neutro Abs: 7.8 10*3/uL — ABNORMAL HIGH (ref 1.7–7.7)
PLATELETS: 114 10*3/uL — AB (ref 150–400)
RBC: 4.04 MIL/uL (ref 3.87–5.11)
RDW: 17.7 % — ABNORMAL HIGH (ref 11.5–15.5)
WBC: 10 10*3/uL (ref 4.0–10.5)

## 2015-01-23 LAB — COMPREHENSIVE METABOLIC PANEL
ALBUMIN: 3.3 g/dL — AB (ref 3.5–5.0)
ALK PHOS: 98 U/L (ref 38–126)
ALT: 17 U/L (ref 14–54)
AST: 27 U/L (ref 15–41)
Anion gap: 17 — ABNORMAL HIGH (ref 5–15)
BUN: 68 mg/dL — AB (ref 6–20)
CHLORIDE: 95 mmol/L — AB (ref 101–111)
CO2: 24 mmol/L (ref 22–32)
CREATININE: 9.86 mg/dL — AB (ref 0.44–1.00)
Calcium: 8.3 mg/dL — ABNORMAL LOW (ref 8.9–10.3)
GFR calc non Af Amer: 3 mL/min — ABNORMAL LOW (ref >60)
GFR, EST AFRICAN AMERICAN: 4 mL/min — AB (ref >60)
GLUCOSE: 285 mg/dL — AB (ref 65–99)
Potassium: 4.9 mmol/L (ref 3.5–5.1)
SODIUM: 136 mmol/L (ref 135–145)
Total Bilirubin: 0.5 mg/dL (ref 0.3–1.2)
Total Protein: 6.9 g/dL (ref 6.5–8.1)

## 2015-01-23 MED ORDER — MORPHINE SULFATE (PF) 4 MG/ML IV SOLN
4.0000 mg | Freq: Once | INTRAVENOUS | Status: AC
  Administered 2015-01-23: 4 mg via INTRAVENOUS
  Filled 2015-01-23: qty 1

## 2015-01-23 NOTE — Discharge Instructions (Signed)
Take your medications as prescribed.  Your dialysis catheter in the azygos vein. Talk to dialysis staff tomorrow to recheck catheter and make sure that there are no issues with it.   See your doctor.   Return to ER if you have severe chest pain, trouble breathing, vomiting, fever.

## 2015-01-23 NOTE — ED Notes (Signed)
Patient transported to X-ray 

## 2015-01-23 NOTE — ED Notes (Signed)
pts placed on monitor upon return to room from radiology. Pt remains monitored by blood pressure, pulse ox, and 12 lead. pts repeat EKG given to dr Darl Householder. pts family remains at bedside.

## 2015-01-23 NOTE — ED Notes (Signed)
Pt remains monitored by blood pressure, pulse ox, and 12 lead. pts family remains at bedside. Phlebotomy at bedside.

## 2015-01-23 NOTE — ED Provider Notes (Signed)
CSN: [id number]     Arrival date & time 01/23/15  1044 History   First MD Initiated Contact with Patient 01/23/15 1045     Chief Complaint  Patient presents with  . Chest Pain     (Consider location/radiation/quality/duration/timing/severity/associated sxs/prior Treatment) The history is provided by the patient.  [name] is a 81 y.o. female hx of HTN, GERD, COPD, ESRD on HD (last HD was 2 days ago), here with chest pain, left breast pain. Patient has intermittent chest pain and left breast pain for the last week or so. Has shortness of breath that is stable. Has some pain around left chest dialysis graft. Graft has been in for years and had no previous infection at that site. No fevers or chills or rash. No hx of CAD.    Past Medical History  Diagnosis Date  . Hypertension   . Asthma   . GERD (gastroesophageal reflux disease)   . Neuropathy in diabetes     Hx: of  . COPD (chronic obstructive pulmonary disease)   . Anemia   . High cholesterol   . Pulmonary embolism 1987  . Type II diabetes mellitus   . History of blood transfusion 1987; 2011    "related to lung OR; HgB went down to 5"  . Arthritis     "hips, knees, shoulders" (11/10/2013)  . Chronic lower back pain   . Gout   . Multiple myeloma   . Neuropathy   . Hypothyroidism   . ESRD (end stage renal disease) on dialysis     "1st treatment 11/09/2013" MWF  . Kidney stones    Past Surgical History  Procedure Laterality Date  . Lung removal, partial Left 1987    "blood clot"  . Cholecystectomy    . Av fistula placement Left 2011    "lower arm; never used"  . Colonoscopy    . Abdominoplasty  ~ 1983    "tummy tuck"  . Abdominal hysterectomy  1964  . Av fistula placement Left 05/15/2013    Procedure: INSERTION OF ARTERIOVENOUS (AV) GORE-TEX GRAFT ARM-LEFT UPPER ARM;  Surgeon: Mal Misty, MD;  Location: [facility];  Service: Vascular;  Laterality: Left;  . Appendectomy  1954  . Umbilical hernia repair  ~ 1983  .  Tubal ligation  1964  . Cystoscopy w/ stone manipulation  1980's  . Hernia repair      umbicical hernia  . Eye surgery Bilateral 2016    CAtaract-   . Av fistula placement Right 10/19/2014    Procedure: INSERTION OF ARTERIOVENOUS (AV) 4-19mm x 45cm GORE-TEX GRAFT RIGHT FOREARM;  Surgeon: Angelia Mould, MD;  Location: [facility] OR;  Service: Vascular;  Laterality: Right;   Family History  Problem Relation Age of Onset  . Blindness Father   . Diabetes Father   . Diabetes Sister   . Kidney failure Brother    Social History  Substance Use Topics  . Smoking status: Never Smoker   . Smokeless tobacco: Never Used  . Alcohol Use: No   OB History    No data available     Review of Systems  Cardiovascular: Positive for chest pain.  All other systems reviewed and are negative.     Allergies  Penicillins cross reactors  Home Medications   Prior to Admission medications   Medication Sig Start Date End Date Taking? Authorizing Provider  acyclovir (ZOVIRAX) 400 MG tablet Take 1 tablet (400 mg total) by mouth daily. 12/23/14   Lattie Haw  Verlene Mayer, NP  albuterol (PROVENTIL HFA;VENTOLIN HFA) 108 (90 BASE) MCG/ACT inhaler Inhale 1 puff into the lungs 2 (two) times daily as needed for wheezing or shortness of breath.    Historical Provider, MD  allopurinol (ZYLOPRIM) 100 MG tablet Take 100 mg by mouth daily with breakfast.     Historical Provider, MD  amLODipine (NORVASC) 5 MG tablet Take 5 mg by mouth daily with breakfast. Patient states she only takes medication on days that she does not have dialysis which is 4 days out of the week. Receives dialysis on Monday, Wednesday and Fridays.    Historical Provider, MD  B-D ULTRAFINE III SHORT PEN 31G X 8 MM MISC  07/19/14   Historical Provider, MD  Cyanocobalamin (VITAMIN B-12 PO) Take 1 tablet by mouth daily.    Historical Provider, MD  dexamethasone (DECADRON) 4 MG tablet Take 10 tablets (40 mg total) by mouth once a week. 01/20/15   Owens Shark, NP   gabapentin (NEURONTIN) 100 MG capsule Take 100 mg by mouth 3 (three) times daily.    Historical Provider, MD  glimepiride (AMARYL) 1 MG tablet Take 1 mg by mouth daily with breakfast.     Historical Provider, MD  HYDROcodone-acetaminophen (NORCO) 7.5-325 MG per tablet Take 1 tablet by mouth every 6 (six) hours as needed for moderate pain (10/$RemoveBeforeDEI'325mg'[id number]$  changed by Dr. Katherine Roan).  05/22/13   Corky Sox, MD  hydrOXYzine (ATARAX/VISTARIL) 25 MG tablet Take 25 mg by mouth every 12 (twelve) hours as needed for itching.  09/07/14   Historical Provider, MD  LEVEMIR FLEXTOUCH 100 UNIT/ML Pen Inject 12 Units into the skin daily as needed (high blood sugar). If Blood sugar is above 110 pt will take 09/15/14   Historical Provider, MD  levothyroxine (SYNTHROID, LEVOTHROID) 100 MCG tablet Take 100 mcg by mouth daily. 09/01/14   Historical Provider, MD  lidocaine-prilocaine (EMLA) cream Apply 1 application topically as needed (for numbing).  11/06/13   Historical Provider, MD  multivitamin (RENA-VIT) TABS tablet Take 1 tablet by mouth daily.    Historical Provider, MD  ondansetron (ZOFRAN) 8 MG tablet Take 1 tablet (8 mg total) by mouth every 8 (eight) hours as needed for nausea or vomiting. Patient not taking: Reported on 01/13/2015 08/05/13   Curt Bears, MD  ONE [facility] ULTRA TEST test strip  12/05/13   Historical Provider, MD  Oxycodone HCl 20 MG TABS Take 20 mg by mouth every 12 (twelve) hours.  06/07/13   Historical Provider, MD  pantoprazole (PROTONIX) 40 MG tablet Take 40 mg by mouth 2 (two) times daily.     Historical Provider, MD  saxagliptin HCl (ONGLYZA) 2.5 MG TABS tablet Take 2.5 mg by mouth daily with breakfast.     Historical Provider, MD  tiZANidine (ZANAFLEX) 4 MG tablet  01/11/15   Historical Provider, MD   BP 134/61 mmHg  Pulse 65  Temp(Src) 97.7 F (36.5 C) (Oral)  Resp 18  Ht $R'5\' 4"'DH$  (1.626 m)  Wt 205 lb (92.987 kg)  BMI 35.17 kg/m2  SpO2 98% Physical Exam  Constitutional: She is oriented to  person, place, and time.  Chronically ill, NAD   HENT:  Head: Normocephalic.  Mouth/Throat: Oropharynx is clear and moist.  Eyes: Conjunctivae are normal. Pupils are equal, round, and reactive to light.  Neck: Normal range of motion. Neck supple.  Cardiovascular: Normal rate, regular rhythm and normal heart sounds.   Pulmonary/Chest: Effort normal and breath sounds normal. No respiratory distress. She has no  wheezes. She has no rales.  Tenderness under L breast, no obvious rash or deformity. l chest dialysis graft with no signs of infection   Abdominal: Soft. Bowel sounds are normal.  Musculoskeletal: Normal range of motion. She exhibits no edema or tenderness.  Neurological: She is alert and oriented to person, place, and time. No cranial nerve deficit. Coordination normal.  Skin: Skin is warm and dry.  Psychiatric: She has a normal mood and affect. Her behavior is normal. Judgment and thought content normal.  Nursing note and vitals reviewed.   ED Course  Procedures (including critical care time) Labs Review Labs Reviewed  CBC WITH DIFFERENTIAL/PLATELET - Abnormal; Notable for the following:    RDW 17.7 (*)    Platelets 114 (*)    Neutrophils Relative % 78 (*)    Neutro Abs 7.8 (*)    All other components within normal limits  COMPREHENSIVE METABOLIC PANEL - Abnormal; Notable for the following:    Chloride 95 (*)    Glucose, Bld 285 (*)    BUN 68 (*)    Creatinine, Ser 9.86 (*)    Calcium 8.3 (*)    Albumin 3.3 (*)    GFR calc non Af Amer 3 (*)    GFR calc Af Amer 4 (*)    Anion gap 17 (*)    All other components within normal limits  I-STAT TROPOININ, ED  I-STAT TROPOININ, ED    Imaging Review Dg Chest 2 View  01/23/2015   CLINICAL DATA:  Acute onset of chest pain earlier today. Current history of hypertension, diabetes, COPD, asthma and end-stage renal disease on hemodialysis. Personal history of left lower lobe lung cancer.  EXAM: CHEST  2 VIEW  COMPARISON:   11/09/2013 and earlier.  FINDINGS: Cardiac silhouette normal in size, unchanged. Thoracic aorta mildly tortuous and atherosclerotic, unchanged. Hilar and mediastinal contours otherwise unremarkable. Pleuroparenchymal scarring at the left base related to prior resection of left lower lobe lung cancer. Prominent bronchovascular markings diffusely and mild central peribronchial thickening, unchanged. Lungs otherwise clear. No localized airspace consolidation. No pleural effusions. No pneumothorax. Normal pulmonary vascularity. Mild degenerative changes involving the thoracic spine.  Left jugular dialysis catheter with its tip directed posteriorly on the lateral image, therefore likely in the azygos vein. Stent noted within the left axillary vein.  IMPRESSION: 1. Stable pleuroparenchymal scarring at the left base. Stable moderate changes of chronic bronchitis. No acute cardiopulmonary disease. 2. Left jugular dialysis catheter tips likely in the azygos vein, as the tip of the catheter is directed posteriorly on the lateral image. Is there difficulty in using the catheter?   Electronically Signed   By: Evangeline Dakin M.D.   On: 01/23/2015 12:57   I have personally reviewed and evaluated these images and lab results as part of my medical decision-making.   EKG Interpretation   Date/Time:  Sunday January 23 2015 10:53:53 EDT Ventricular Rate:  69 PR Interval:  132 QRS Duration: 89 QT Interval:  414 QTC Calculation: 443 R Axis:   27 Text Interpretation:  Sinus rhythm Low voltage, precordial leads No  significant change since last tracing Confirmed by YAO  MD, DAVID ([id number])  on 01/23/2015 12:48:19 PM      MDM   Final diagnoses:  None    Langeloth is a 81 y.o. female here with chest pain. No signs of shingles or graft infection. SOB stable. I doubt PE. Consider ACS vs MSK. Will get trop x 2 (no hx of CAD).  Will reassess.   3:21 PM Xray showed that catheter tip in the azygos vein. Patient  states that sometimes they have issues during dialysis since she had the catheter placed 2 months ago. Not hyperkalemic. Labs at baseline. Trop neg x 2. Pain improved. I doubt ACS. I consulted Dr. Florene Glen, nephrology, who recommend that patient mention it during dialysis tomorrow. Will dc home.   Wandra Arthurs, MD 01/23/15 [phone]

## 2015-01-23 NOTE — ED Notes (Addendum)
Pt states after eating breakfast this am she laid down and got a sharp pain under her left breast that came and went. Pt denies any pain at this time. Pt is dialysis pt last dialysis treatment Friday.

## 2015-01-25 ENCOUNTER — Encounter: Payer: Self-pay | Admitting: Vascular Surgery

## 2015-01-26 ENCOUNTER — Ambulatory Visit (INDEPENDENT_AMBULATORY_CARE_PROVIDER_SITE_OTHER)
Admission: RE | Admit: 2015-01-26 | Discharge: 2015-01-26 | Disposition: A | Discharge status: Home / Self Care | Payer: Medicare Other | Source: Ambulatory Visit | Attending: Vascular Surgery | Admitting: Vascular Surgery

## 2015-01-26 ENCOUNTER — Ambulatory Visit (INDEPENDENT_AMBULATORY_CARE_PROVIDER_SITE_OTHER): Payer: Self-pay | Admitting: Vascular Surgery

## 2015-01-26 ENCOUNTER — Encounter: Payer: Self-pay | Admitting: Vascular Surgery

## 2015-01-26 ENCOUNTER — Ambulatory Visit (HOSPITAL_COMMUNITY)
Admission: RE | Admit: 2015-01-26 | Discharge: 2015-01-26 | Disposition: A | Discharge status: Home / Self Care | Payer: Medicare Other | Source: Ambulatory Visit | Attending: Vascular Surgery | Admitting: Vascular Surgery

## 2015-01-26 VITALS — BP 133/75 | HR 71 | Temp 98.5°F | Resp 16 | Ht 65.0 in | Wt 205.0 lb

## 2015-01-26 DIAGNOSIS — Z01810 Encounter for preprocedural cardiovascular examination: Secondary | ICD-10-CM

## 2015-01-26 DIAGNOSIS — N186 End stage renal disease: Secondary | ICD-10-CM

## 2015-01-26 DIAGNOSIS — N185 Chronic kidney disease, stage 5: Secondary | ICD-10-CM

## 2015-01-26 NOTE — Progress Notes (Signed)
[facility] [place]  Patient name: [name] MRN: [id number] DOB: [birth year] Sex: female  REASON FOR VISIT: Evaluate for new hemodialysis access.  QMV:HQIONG [name] is a 81 y.o. female had a new right forearm AV graft placed on 10/19/2014.this graft subsequently occluded. On 11/29/2014 the patient underwent attempted thrombolysis at [facility]  A stent had to be placed within the graft. The central and axillary veins were patent. The graft subsequently occluded. He had a catheter placed. She is sent for evaluation for new access.  She dialyzes on Monday Wednesdays and Fridays. She goes to her oncologist every Thursday. Therefore her surgery will have to be done on Tuesday.   Past Medical History  Diagnosis Date  . Hypertension   . Asthma   . GERD (gastroesophageal reflux disease)   . Neuropathy in diabetes     Hx: of  . COPD (chronic obstructive pulmonary disease)   . Anemia   . High cholesterol   . Pulmonary embolism 1987  . Type II diabetes mellitus   . History of blood transfusion 1987; 2011    "related to lung OR; HgB went down to 5"  . Arthritis     "hips, knees, shoulders" (11/10/2013)  . Chronic lower back pain   . Gout   . Multiple myeloma   . Neuropathy   . Hypothyroidism   . ESRD (end stage renal disease) on dialysis     "1st treatment 11/09/2013" MWF  . Kidney stones    Family History  Problem Relation Age of Onset  . Blindness Father   . Diabetes Father   . Diabetes Sister   . Kidney failure Brother    SOCIAL HISTORY: Social History  Substance Use Topics  . Smoking status: Never Smoker   . Smokeless tobacco: Never Used  . Alcohol Use: No   Allergies  Allergen Reactions  . Penicillins Cross Reactors Other (See Comments)    Tongue Swelling   Current Outpatient Prescriptions  Medication Sig Dispense Refill  . acyclovir (ZOVIRAX) 400 MG tablet Take 1 tablet (400 mg total) by mouth daily. 30 tablet 0  . albuterol  (PROVENTIL HFA;VENTOLIN HFA) 108 (90 BASE) MCG/ACT inhaler Inhale 1 puff into the lungs 2 (two) times daily as needed for wheezing or shortness of breath.    . allopurinol (ZYLOPRIM) 100 MG tablet Take 100 mg by mouth daily with breakfast.     . amLODipine (NORVASC) 5 MG tablet Take 5 mg by mouth daily with breakfast. Patient states she only takes medication on days that she does not have dialysis which is 4 days out of the week. Receives dialysis on Monday, Wednesday and Fridays.    . B-D ULTRAFINE III SHORT PEN 31G X 8 MM MISC   4  . Cyanocobalamin (VITAMIN B-12 PO) Take 1 tablet by mouth daily.    Marland Kitchen dexamethasone (DECADRON) 4 MG tablet Take 10 tablets (40 mg total) by mouth once a week. 40 tablet 0  . gabapentin (NEURONTIN) 100 MG capsule Take 100 mg by mouth 3 (three) times daily.    Marland Kitchen glimepiride (AMARYL) 1 MG tablet Take 1 mg by mouth daily with breakfast.     . HYDROcodone-acetaminophen (NORCO) 10-325 MG per tablet TK 1 T PO  Q 6 H PRN P  0  . hydrOXYzine (ATARAX/VISTARIL) 25 MG tablet Take 25 mg by mouth every 12 (twelve) hours as needed for itching.   2  . LEVEMIR FLEXTOUCH 100 UNIT/ML Pen Inject  12 Units into the skin daily as needed (high blood sugar). If Blood sugar is above 110 pt will take  5  . levothyroxine (SYNTHROID, LEVOTHROID) 100 MCG tablet Take 100 mcg by mouth daily.  4  . lidocaine-prilocaine (EMLA) cream Apply 1 application topically as needed (for numbing).     . multivitamin (RENA-VIT) TABS tablet Take 1 tablet by mouth daily.    . ondansetron (ZOFRAN) 8 MG tablet Take 1 tablet (8 mg total) by mouth every 8 (eight) hours as needed for nausea or vomiting. 20 tablet 1  . ONE TOUCH ULTRA TEST test strip     . Oxycodone HCl 20 MG TABS Take 20 mg by mouth every 12 (twelve) hours.     . pantoprazole (PROTONIX) 40 MG tablet Take 40 mg by mouth 2 (two) times daily.     . saxagliptin HCl (ONGLYZA) 2.5 MG TABS tablet Take 2.5 mg by mouth daily with breakfast.     . tiZANidine  (ZANAFLEX) 4 MG tablet     . HYDROcodone-acetaminophen (NORCO) 7.5-325 MG per tablet Take 1 tablet by mouth every 6 (six) hours as needed for moderate pain (10/325mg  changed by Dr. Katherine Rice).      No current facility-administered medications for this visit.   REVIEW OF SYSTEMS: Valu.Nieves ] denotes positive finding; [  ] denotes negative finding  CARDIOVASCULAR:  Valu.Nieves ] chest pain   [ ]  chest pressure   [ ]  palpitations   [ ]  orthopnea   [ ]  dyspnea on exertion   Valu.Nieves ] claudication   [ ]  rest pain   [ ]  DVT   [ ]  phlebitis PULMONARY:   [ ]  productive cough   Valu.Nieves ] asthma   [ ]  wheezing NEUROLOGIC:   [ ]  weakness  [ ]  paresthesias  [ ]  aphasia  [ ]  amaurosis  [ ]  dizziness HEMATOLOGIC:   [ ]  bleeding problems   [ ]  clotting disorders MUSCULOSKELETAL:  [ ]  joint pain   [ ]  joint swelling [ ]  leg swelling GASTROINTESTINAL: [ ]   blood in stool  [ ]   hematemesis GENITOURINARY:  [ ]   dysuria  [ ]   hematuria PSYCHIATRIC:  [ ]  history of major depression INTEGUMENTARY:  [ ]  rashes  [ ]  ulcers CONSTITUTIONAL:  [ ]  fever   [ ]  chills  PHYSICAL EXAM: Filed Vitals:   01/26/15 1444  BP: 133/75  Pulse: 71  Temp: 98.5 F (36.9 C)  TempSrc: Oral  Resp: 16  Height: 5\' 5"  (1.651 m)  Weight: 205 lb (92.987 kg)  SpO2: 98%   GENERAL: The patient is a well-nourished female, in no acute distress. The vital signs are documented above. CARDIAC: There is a regular rate and rhythm.  VASCULAR: she has a palpable right brachial pulse. PULMONARY: There is good air exchange bilaterally without wheezing or rales. SKIN: There are no ulcers or rashes noted. PSYCHIATRIC: The patient has a normal affect.  DATA:  I have reviewed her arterial Doppler study which shows no significant arterial disease.  Vein mapping of the right upper extremity shows a marginal right upper arm cephalic vein and basilic vein. When I looked at this at the time of surgery these veins did not appear adequate.  MEDICAL ISSUES:  STAGE V  CHRONIC KIDNEY DISEASE: I will look at her upper arm cephalic vein and basilic vein again in the right arm but I suspect she will require a right upper arm AV graft. This has to be done on  a Tuesday so we have scheduled this for 02/15/2015. I have discussed the indications and potential complications with the patient today and she is agreeable to proceed.   [name] [facility] [place]: [phone]

## 2015-01-27 ENCOUNTER — Ambulatory Visit (HOSPITAL_BASED_OUTPATIENT_CLINIC_OR_DEPARTMENT_OTHER): Payer: Medicare Other

## 2015-01-27 ENCOUNTER — Other Ambulatory Visit (HOSPITAL_BASED_OUTPATIENT_CLINIC_OR_DEPARTMENT_OTHER): Payer: Medicare Other

## 2015-01-27 ENCOUNTER — Encounter: Payer: Self-pay | Admitting: Nephrology

## 2015-01-27 ENCOUNTER — Other Ambulatory Visit: Payer: Self-pay

## 2015-01-27 VITALS — BP 126/67 | HR 71 | Temp 98.3°F | Resp 18

## 2015-01-27 DIAGNOSIS — C9000 Multiple myeloma not having achieved remission: Secondary | ICD-10-CM | POA: Diagnosis not present

## 2015-01-27 DIAGNOSIS — Z5112 Encounter for antineoplastic immunotherapy: Secondary | ICD-10-CM | POA: Diagnosis not present

## 2015-01-27 LAB — CBC WITH DIFFERENTIAL/PLATELET
BASO%: 0.4 % (ref 0.0–2.0)
Basophils Absolute: 0 10*3/uL (ref 0.0–0.1)
EOS%: 1.1 % (ref 0.0–7.0)
Eosinophils Absolute: 0.1 10*3/uL (ref 0.0–0.5)
HCT: 38.2 % (ref 34.8–46.6)
HEMOGLOBIN: 12.7 g/dL (ref 11.6–15.9)
LYMPH#: 1.2 10*3/uL (ref 0.9–3.3)
LYMPH%: 14.8 % (ref 14.0–49.7)
MCH: 31.1 pg (ref 25.1–34.0)
MCHC: 33.2 g/dL (ref 31.5–36.0)
MCV: 93.6 fL (ref 79.5–101.0)
MONO#: 0.5 10*3/uL (ref 0.1–0.9)
MONO%: 6.5 % (ref 0.0–14.0)
NEUT%: 77.2 % — ABNORMAL HIGH (ref 38.4–76.8)
NEUTROS ABS: 6.2 10*3/uL (ref 1.5–6.5)
NRBC: 0 % (ref 0–0)
Platelets: 116 10*3/uL — ABNORMAL LOW (ref 145–400)
RBC: 4.08 10*6/uL (ref 3.70–5.45)
RDW: 18.1 % — AB (ref 11.2–14.5)
WBC: 8 10*3/uL (ref 3.9–10.3)

## 2015-01-27 LAB — COMPREHENSIVE METABOLIC PANEL (CC13)
ALBUMIN: 3.1 g/dL — AB (ref 3.5–5.0)
ALK PHOS: 101 U/L (ref 40–150)
ALT: 17 U/L (ref 0–55)
AST: 16 U/L (ref 5–34)
Anion Gap: 16 mEq/L — ABNORMAL HIGH (ref 3–11)
BILIRUBIN TOTAL: 0.49 mg/dL (ref 0.20–1.20)
BUN: 35.6 mg/dL — AB (ref 7.0–26.0)
CO2: 23 meq/L (ref 22–29)
Calcium: 8.9 mg/dL (ref 8.4–10.4)
Chloride: 102 mEq/L (ref 98–109)
Creatinine: 7.2 mg/dL (ref 0.6–1.1)
EGFR: 6 mL/min/{1.73_m2} — ABNORMAL LOW (ref >90)
GLUCOSE: 212 mg/dL — AB (ref 70–140)
Potassium: 4.1 mEq/L (ref 3.5–5.1)
SODIUM: 140 meq/L (ref 136–145)
TOTAL PROTEIN: 6.7 g/dL (ref 6.4–8.3)

## 2015-01-27 MED ORDER — BORTEZOMIB CHEMO SQ INJECTION 3.5 MG (2.5MG/ML)
1.3000 mg/m2 | Freq: Once | INTRAMUSCULAR | Status: AC
  Administered 2015-01-27: 2.75 mg via SUBCUTANEOUS
  Filled 2015-01-27: qty 2.75

## 2015-01-27 MED ORDER — ONDANSETRON HCL 8 MG PO TABS
ORAL_TABLET | ORAL | Status: AC
  Filled 2015-01-27: qty 1

## 2015-01-27 MED ORDER — ONDANSETRON HCL 8 MG PO TABS
8.0000 mg | ORAL_TABLET | Freq: Once | ORAL | Status: AC
  Administered 2015-01-27: 8 mg via ORAL

## 2015-01-27 NOTE — Patient Instructions (Signed)
[facility] Discharge Instructions for Patients Receiving Chemotherapy  Today you received the following chemotherapy agents Velcade. To help prevent nausea and vomiting after your treatment, we encourage you to take your nausea medication as directed.  If you develop nausea and vomiting that is not controlled by your nausea medication, call the clinic.   BELOW ARE SYMPTOMS THAT SHOULD BE REPORTED IMMEDIATELY:  *FEVER GREATER THAN 100.5 F  *CHILLS WITH OR WITHOUT FEVER  NAUSEA AND VOMITING THAT IS NOT CONTROLLED WITH YOUR NAUSEA MEDICATION  *UNUSUAL SHORTNESS OF BREATH  *UNUSUAL BRUISING OR BLEEDING  TENDERNESS IN MOUTH AND THROAT WITH OR WITHOUT PRESENCE OF ULCERS  *URINARY PROBLEMS  *BOWEL PROBLEMS  UNUSUAL RASH Items with * indicate a potential emergency and should be followed up as soon as possible.  Feel free to call the clinic you have any questions or concerns. The clinic phone number is [phone].  Please show the CHEMO ALERT CARD at check-in to the Emergency Department and triage nurse.    

## 2015-01-27 NOTE — Progress Notes (Signed)
Labs reviewed with Dr. Julien Nordmann, okay to treat per Dr. Julien Nordmann.

## 2015-02-01 ENCOUNTER — Other Ambulatory Visit: Payer: Self-pay | Admitting: Nurse Practitioner

## 2015-02-02 ENCOUNTER — Other Ambulatory Visit: Payer: Self-pay | Admitting: Internal Medicine

## 2015-02-02 NOTE — Telephone Encounter (Signed)
Rx request ok'd by Dr. Julien Nordmann

## 2015-02-03 ENCOUNTER — Ambulatory Visit (HOSPITAL_BASED_OUTPATIENT_CLINIC_OR_DEPARTMENT_OTHER): Payer: Medicare Other | Admitting: Nurse Practitioner

## 2015-02-03 ENCOUNTER — Ambulatory Visit: Payer: Medicare Other | Admitting: Physician Assistant

## 2015-02-03 ENCOUNTER — Telehealth: Payer: Self-pay | Admitting: Nurse Practitioner

## 2015-02-03 ENCOUNTER — Ambulatory Visit (HOSPITAL_BASED_OUTPATIENT_CLINIC_OR_DEPARTMENT_OTHER): Payer: Medicare Other

## 2015-02-03 ENCOUNTER — Other Ambulatory Visit (HOSPITAL_BASED_OUTPATIENT_CLINIC_OR_DEPARTMENT_OTHER): Payer: Medicare Other

## 2015-02-03 VITALS — BP 130/52 | HR 72 | Temp 98.0°F | Resp 18 | Ht 65.0 in | Wt 209.3 lb

## 2015-02-03 DIAGNOSIS — N186 End stage renal disease: Secondary | ICD-10-CM | POA: Diagnosis not present

## 2015-02-03 DIAGNOSIS — C9000 Multiple myeloma not having achieved remission: Secondary | ICD-10-CM | POA: Diagnosis not present

## 2015-02-03 DIAGNOSIS — N185 Chronic kidney disease, stage 5: Secondary | ICD-10-CM

## 2015-02-03 DIAGNOSIS — Z992 Dependence on renal dialysis: Secondary | ICD-10-CM

## 2015-02-03 DIAGNOSIS — Z5112 Encounter for antineoplastic immunotherapy: Secondary | ICD-10-CM

## 2015-02-03 LAB — COMPREHENSIVE METABOLIC PANEL (CC13)
ALBUMIN: 3.1 g/dL — AB (ref 3.5–5.0)
ALK PHOS: 107 U/L (ref 40–150)
ALT: 12 U/L (ref 0–55)
AST: 12 U/L (ref 5–34)
Anion Gap: 13 mEq/L — ABNORMAL HIGH (ref 3–11)
BUN: 36.2 mg/dL — AB (ref 7.0–26.0)
CO2: 25 mEq/L (ref 22–29)
Calcium: 8.7 mg/dL (ref 8.4–10.4)
Chloride: 103 mEq/L (ref 98–109)
Creatinine: 7.4 mg/dL (ref 0.6–1.1)
EGFR: 5 mL/min/{1.73_m2} — ABNORMAL LOW (ref >90)
GLUCOSE: 280 mg/dL — AB (ref 70–140)
POTASSIUM: 4 meq/L (ref 3.5–5.1)
Sodium: 141 mEq/L (ref 136–145)
TOTAL PROTEIN: 6.8 g/dL (ref 6.4–8.3)
Total Bilirubin: 0.41 mg/dL (ref 0.20–1.20)

## 2015-02-03 LAB — CBC WITH DIFFERENTIAL/PLATELET
BASO%: 0.8 % (ref 0.0–2.0)
Basophils Absolute: 0.1 10*3/uL (ref 0.0–0.1)
EOS%: 0.9 % (ref 0.0–7.0)
Eosinophils Absolute: 0.1 10*3/uL (ref 0.0–0.5)
HCT: 37.6 % (ref 34.8–46.6)
HEMOGLOBIN: 12 g/dL (ref 11.6–15.9)
LYMPH%: 8.8 % — ABNORMAL LOW (ref 14.0–49.7)
MCH: 29.9 pg (ref 25.1–34.0)
MCHC: 32 g/dL (ref 31.5–36.0)
MCV: 93.5 fL (ref 79.5–101.0)
MONO#: 0.4 10*3/uL (ref 0.1–0.9)
MONO%: 5.6 % (ref 0.0–14.0)
NEUT%: 83.9 % — ABNORMAL HIGH (ref 38.4–76.8)
NEUTROS ABS: 6.5 10*3/uL (ref 1.5–6.5)
Platelets: 139 10*3/uL — ABNORMAL LOW (ref 145–400)
RBC: 4.02 10*6/uL (ref 3.70–5.45)
RDW: 20 % — AB (ref 11.2–14.5)
WBC: 7.7 10*3/uL (ref 3.9–10.3)
lymph#: 0.7 10*3/uL — ABNORMAL LOW (ref 0.9–3.3)

## 2015-02-03 MED ORDER — ONDANSETRON HCL 8 MG PO TABS
ORAL_TABLET | ORAL | Status: AC
  Filled 2015-02-03: qty 1

## 2015-02-03 MED ORDER — ONDANSETRON HCL 8 MG PO TABS
8.0000 mg | ORAL_TABLET | Freq: Once | ORAL | Status: AC
  Administered 2015-02-03: 8 mg via ORAL

## 2015-02-03 MED ORDER — BORTEZOMIB CHEMO SQ INJECTION 3.5 MG (2.5MG/ML)
1.3000 mg/m2 | Freq: Once | INTRAMUSCULAR | Status: AC
  Administered 2015-02-03: 2.75 mg via SUBCUTANEOUS
  Filled 2015-02-03: qty 2.75

## 2015-02-03 NOTE — Progress Notes (Signed)
OK to treat per Selena Lesser, NP

## 2015-02-03 NOTE — Patient Instructions (Signed)
[facility] Discharge Instructions for Patients Receiving Chemotherapy  Today you received the following chemotherapy agents Velcade  To help prevent nausea and vomiting after your treatment, we encourage you to take your nausea medication Zofran 8 mg every 8 hours as needed.   If you develop nausea and vomiting that is not controlled by your nausea medication, call the clinic.   BELOW ARE SYMPTOMS THAT SHOULD BE REPORTED IMMEDIATELY:  *FEVER GREATER THAN 100.5 F  *CHILLS WITH OR WITHOUT FEVER  NAUSEA AND VOMITING THAT IS NOT CONTROLLED WITH YOUR NAUSEA MEDICATION  *UNUSUAL SHORTNESS OF BREATH  *UNUSUAL BRUISING OR BLEEDING  TENDERNESS IN MOUTH AND THROAT WITH OR WITHOUT PRESENCE OF ULCERS  *URINARY PROBLEMS  *BOWEL PROBLEMS  UNUSUAL RASH Items with * indicate a potential emergency and should be followed up as soon as possible.  Feel free to call the clinic you have any questions or concerns. The clinic phone number is [phone].  Please show the CHEMO ALERT CARD at check-in to the Emergency Department and triage nurse.   

## 2015-02-03 NOTE — Telephone Encounter (Signed)
per Jenny Reichmann to move from [place] to her sch-pt moved

## 2015-02-08 ENCOUNTER — Encounter: Payer: Self-pay | Admitting: Nurse Practitioner

## 2015-02-08 NOTE — Progress Notes (Signed)
SYMPTOM MANAGEMENT CLINIC   HPI: [name] 81 y.o. female diagnosed with multiple myeloma.  Currently undergoing Velcade injections on a weekly basis.  Patient continues with her weekly Velcade injections.  Patient states that she is tolerating her Velcade injections fairly well; with minimal complaints.  She also denies any recent fevers or chills.  Blood counts today reveal a WBC of 7.7, ANC 6.5, hemoglobin 12.0, and platelet count of 139.  Patient will proceed today with cycle 44 of her Velcade injections.  Patient also has a history of chronic end-stage renal disease.  She receives dialysis on a Monday/Wednesday/Friday basis.  She just received her last dialysis just yesterday.  HPI  ROS  Past Medical History  Diagnosis Date  . Hypertension   . Asthma   . GERD (gastroesophageal reflux disease)   . Neuropathy in diabetes     Hx: of  . COPD (chronic obstructive pulmonary disease)   . Anemia   . High cholesterol   . Pulmonary embolism 1987  . Type II diabetes mellitus   . History of blood transfusion 1987; 2011    "related to lung OR; HgB went down to 5"  . Arthritis     "hips, knees, shoulders" (11/10/2013)  . Chronic lower back pain   . Gout   . Multiple myeloma   . Neuropathy   . Hypothyroidism   . ESRD (end stage renal disease) on dialysis     "1st treatment 11/09/2013" MWF  . Kidney stones     Past Surgical History  Procedure Laterality Date  . Lung removal, partial Left 1987    "blood clot"  . Cholecystectomy    . Av fistula placement Left 2011    "lower arm; never used"  . Colonoscopy    . Abdominoplasty  ~ 1983    "tummy tuck"  . Abdominal hysterectomy  1964  . Av fistula placement Left 05/15/2013    Procedure: INSERTION OF ARTERIOVENOUS (AV) GORE-TEX GRAFT ARM-LEFT UPPER ARM;  Surgeon: Mal Misty, MD;  Location: [facility];  Service: Vascular;  Laterality: Left;  . Appendectomy  1954  . Umbilical hernia repair  ~ 1983  . Tubal ligation  1964  .  Cystoscopy w/ stone manipulation  1980's  . Hernia repair      umbicical hernia  . Eye surgery Bilateral 2016    CAtaract-   . Av fistula placement Right 10/19/2014    Procedure: INSERTION OF ARTERIOVENOUS (AV) 4-46m x 45cm GORE-TEX GRAFT RIGHT FOREARM;  Surgeon: CAngelia Mould MD;  [facility];  Service: Vascular;  Laterality: Right;    has MGUS (monoclonal gammopathy of unknown significance); Abdominal pain, acute; Acute diverticulitis; Diabetes mellitus; End stage renal disease; Other complications due to renal dialysis device, implant, and graft; Multiple myeloma; BP (high blood pressure); Acid reflux; CAFL (chronic airflow limitation); Arthritis; Airway hyperreactivity; and Absolute anemia on her problem list.    is allergic to penicillins cross reactors.    Medication List       This list is accurate as of: 02/03/15 11:59 PM.  Always use your most recent med list.               acyclovir 400 MG tablet  Commonly known as:  ZOVIRAX  TAKE 1 TABLET(400 MG) BY MOUTH DAILY     albuterol 108 (90 BASE) MCG/ACT inhaler  Commonly known as:  PROVENTIL HFA;VENTOLIN HFA  Inhale 1 puff into the lungs 2 (two) times daily as needed for  wheezing or shortness of breath.     allopurinol 100 MG tablet  Commonly known as:  ZYLOPRIM  Take 100 mg by mouth daily with breakfast.     amLODipine 5 MG tablet  Commonly known as:  NORVASC  Take 5 mg by mouth daily with breakfast. Patient states she only takes medication on days that she does not have dialysis which is 4 days out of the week. Receives dialysis on Monday, Wednesday and Fridays.     B-D ULTRAFINE III SHORT PEN 31G X 8 MM Misc  Generic drug:  Insulin Pen Needle     dexamethasone 4 MG tablet  Commonly known as:  DECADRON  Take 10 tablets (40 mg total) by mouth once a week.     gabapentin 100 MG capsule  Commonly known as:  NEURONTIN  Take 100 mg by mouth 3 (three) times daily.     glimepiride 1 MG tablet  Commonly  known as:  AMARYL  Take 1 mg by mouth daily with breakfast.     HYDROcodone-acetaminophen 10-325 MG per tablet  Commonly known as:  NORCO  TK 1 T PO  Q 6 H PRN P     hydrOXYzine 25 MG tablet  Commonly known as:  ATARAX/VISTARIL  Take 25 mg by mouth every 12 (twelve) hours as needed for itching.     LEVEMIR FLEXTOUCH 100 UNIT/ML Pen  Generic drug:  Insulin Detemir  Inject 12 Units into the skin daily as needed (high blood sugar). If Blood sugar is above 110 pt will take     levothyroxine 100 MCG tablet  Commonly known as:  SYNTHROID, LEVOTHROID  Take 100 mcg by mouth daily.     lidocaine-prilocaine cream  Commonly known as:  EMLA  Apply 1 application topically as needed (for numbing).     multivitamin Tabs tablet  Take 1 tablet by mouth daily.     ondansetron 8 MG tablet  Commonly known as:  ZOFRAN  Take 1 tablet (8 mg total) by mouth every 8 (eight) hours as needed for nausea or vomiting.     ONE TOUCH ULTRA TEST test strip  Generic drug:  glucose blood     Oxycodone HCl 20 MG Tabs  Take 20 mg by mouth every 12 (twelve) hours.     pantoprazole 40 MG tablet  Commonly known as:  PROTONIX  Take 40 mg by mouth 2 (two) times daily.     saxagliptin HCl 2.5 MG Tabs tablet  Commonly known as:  ONGLYZA  Take 2.5 mg by mouth daily with breakfast.     tiZANidine 4 MG tablet  Commonly known as:  ZANAFLEX     VITAMIN B-12 PO  Take 1 tablet by mouth daily.         PHYSICAL EXAMINATION  Oncology Vitals 02/03/2015 01/27/2015 01/26/2015 01/23/2015 01/23/2015 01/23/2015 01/23/2015  Height 165 cm - 165 cm - - - -  Weight 94.938 kg - 92.987 kg - - - -  Weight (lbs) 209 lbs 5 oz - 205 lbs - - - -  BMI (kg/m2) 34.83 kg/m2 - 34.11 kg/m2 - - - -  Temp 98 98.3 98.5 - - - -  Pulse 72 71 71 65 60 61 61  Resp _0 Resp (Historical as of 01/03/12) - - - - - - -  SpO2 100 100 98 98 98 96 97  BSA (m2) 2.09 m2 - 2.07 m2 - - - -   BP  Readings from Last 3 Encounters:    02/03/15 130/52  01/27/15 126/67  01/26/15 133/75    Physical Exam  Constitutional: She is oriented to person, place, and time and well-developed, well-nourished, and in no distress.  HENT:  Head: Normocephalic and atraumatic.  Mouth/Throat: Oropharynx is clear and moist.  Eyes: Conjunctivae and EOM are normal. Pupils are equal, round, and reactive to light. Right eye exhibits no discharge. Left eye exhibits no discharge. No scleral icterus.  Neck: Normal range of motion.  Pulmonary/Chest: Effort normal. No respiratory distress.  Musculoskeletal: Normal range of motion. She exhibits no edema or tenderness.  Neurological: She is alert and oriented to person, place, and time. Gait normal.  Skin: Skin is warm and dry. No rash noted. No erythema. No pallor.  Old dialysis grafts to bilateral arms.  Currently is using the left upper chest graft for dialysis.  Psychiatric: Affect normal.  Nursing note and vitals reviewed.   LABORATORY DATA:. Appointment on 02/03/2015  Component Date Value Ref Range Status  . WBC 02/03/2015 7.7  3.9 - 10.3 10e3/uL Final  . NEUT# 02/03/2015 6.5  1.5 - 6.5 10e3/uL Final  . HGB 02/03/2015 12.0  11.6 - 15.9 g/dL Final  . HCT 02/03/2015 37.6  34.8 - 46.6 % Final  . Platelets 02/03/2015 139* 145 - 400 10e3/uL Final  . MCV 02/03/2015 93.5  79.5 - 101.0 fL Final  . MCH 02/03/2015 29.9  25.1 - 34.0 pg Final  . MCHC 02/03/2015 32.0  31.5 - 36.0 g/dL Final  . RBC 02/03/2015 4.02  3.70 - 5.45 10e6/uL Final  . RDW 02/03/2015 20.0* 11.2 - 14.5 % Final  . lymph# 02/03/2015 0.7* 0.9 - 3.3 10e3/uL Final  . MONO# 02/03/2015 0.4  0.1 - 0.9 10e3/uL Final  . Eosinophils Absolute 02/03/2015 0.1  0.0 - 0.5 10e3/uL Final  . Basophils Absolute 02/03/2015 0.1  0.0 - 0.1 10e3/uL Final  . NEUT% 02/03/2015 83.9* 38.4 - 76.8 % Final  . LYMPH% 02/03/2015 8.8* 14.0 - 49.7 % Final  . MONO% 02/03/2015 5.6  0.0 - 14.0 % Final  . EOS% 02/03/2015 0.9  0.0 - 7.0 % Final  . BASO%  02/03/2015 0.8  0.0 - 2.0 % Final  . Sodium 02/03/2015 141  136 - 145 mEq/L Final  . Potassium 02/03/2015 4.0  3.5 - 5.1 mEq/L Final  . Chloride 02/03/2015 103  98 - 109 mEq/L Final  . CO2 02/03/2015 25  22 - 29 mEq/L Final  . Glucose 02/03/2015 280* 70 - 140 mg/dl Final  . BUN 02/03/2015 36.2* 7.0 - 26.0 mg/dL Final  . Creatinine 02/03/2015 7.4* 0.6 - 1.1 mg/dL Final  . Total Bilirubin 02/03/2015 0.41  0.20 - 1.20 mg/dL Final  . Alkaline Phosphatase 02/03/2015 107  40 - 150 U/L Final  . AST 02/03/2015 12  5 - 34 U/L Final  . ALT 02/03/2015 12  0 - 55 U/L Final  . Total Protein 02/03/2015 6.8  6.4 - 8.3 g/dL Final  . Albumin 02/03/2015 3.1* 3.5 - 5.0 g/dL Final  . Calcium 02/03/2015 8.7  8.4 - 10.4 mg/dL Final  . Anion Gap 02/03/2015 13* 3 - 11 mEq/L Final  . EGFR 02/03/2015 5* >90 ml/min/1.73 m2 Final   eGFR is calculated using the CKD-EPI Creatinine Equation (2009)     RADIOGRAPHIC STUDIES: No results found.  ASSESSMENT/PLAN:    End stage renal disease Patient has history of chronic end-stage renal disease.  She receives dialysis on Mondays, Wednesdays, and  Fridays.  She just received dialysis yesterday.  Creatinine today was 7.4.  Previously creatinine been 7.2.  Patient confirmed that she does follow-up with her nephrologist on a regular basis; and just reviewed all with the nephrologist yesterday.  Multiple myeloma Patient continues with her weekly Velcade injections.  Patient states that she is tolerating her Velcade injections fairly well; with minimal complaints.  She also denies any recent fevers or chills.  Blood counts today reveal a WBC of 7.7, ANC 6.5, hemoglobin 12.0, and platelet count of 139.  Patient will proceed today with cycle 44 of her Velcade injections.  Patient has plans to return on 02/10/2015 for labs and her next Velcade injection.  Will confirm with Dr. Julien Nordmann, when her next multiple myeloma blood work will be due.  Patient stated understanding of all  instructions; and was in agreement with this plan of care. The patient knows to call the clinic with any problems, questions or concerns.   Review/collaboration with Dr. Julien Nordmann regarding all aspects of patient's visit today.   Total time spent with patient was 25 minutes;  with greater than 75 percent of that time spent in face to face counseling regarding patient's symptoms,  and coordination of care and follow up.  Disclaimer:This dictation was prepared with Dragon/digital dictation along with [employer]. Any transcriptional errors that result from this process are unintentional.  Drue Second, NP 02/08/2015

## 2015-02-08 NOTE — Assessment & Plan Note (Signed)
Patient continues with her weekly Velcade injections.  Patient states that she is tolerating her Velcade injections fairly well; with minimal complaints.  She also denies any recent fevers or chills.  Blood counts today reveal a WBC of 7.7, ANC 6.5, hemoglobin 12.0, and platelet count of 139.  Patient will proceed today with cycle 44 of her Velcade injections.  Patient has plans to return on 02/10/2015 for labs and her next Velcade injection.  Will confirm with Dr. Julien Nordmann, when her next multiple myeloma blood work will be due.

## 2015-02-08 NOTE — Assessment & Plan Note (Signed)
Patient has history of chronic end-stage renal disease.  She receives dialysis on Mondays, Wednesdays, and Fridays.  She just received dialysis yesterday.  Creatinine today was 7.4.  Previously creatinine been 7.2.  Patient confirmed that she does follow-up with her nephrologist on a regular basis; and just reviewed all with the nephrologist yesterday.

## 2015-02-10 ENCOUNTER — Ambulatory Visit (HOSPITAL_BASED_OUTPATIENT_CLINIC_OR_DEPARTMENT_OTHER): Payer: Medicare Other

## 2015-02-10 ENCOUNTER — Other Ambulatory Visit (HOSPITAL_BASED_OUTPATIENT_CLINIC_OR_DEPARTMENT_OTHER): Payer: Medicare Other

## 2015-02-10 VITALS — BP 113/52 | HR 68 | Temp 98.1°F | Resp 18

## 2015-02-10 DIAGNOSIS — C9000 Multiple myeloma not having achieved remission: Secondary | ICD-10-CM | POA: Diagnosis not present

## 2015-02-10 DIAGNOSIS — Z5112 Encounter for antineoplastic immunotherapy: Secondary | ICD-10-CM | POA: Diagnosis not present

## 2015-02-10 LAB — COMPREHENSIVE METABOLIC PANEL (CC13)
ALT: 17 U/L (ref 0–55)
ANION GAP: 17 meq/L — AB (ref 3–11)
AST: 25 U/L (ref 5–34)
Albumin: 3.1 g/dL — ABNORMAL LOW (ref 3.5–5.0)
Alkaline Phosphatase: 101 U/L (ref 40–150)
BUN: 35.6 mg/dL — ABNORMAL HIGH (ref 7.0–26.0)
CALCIUM: 8.9 mg/dL (ref 8.4–10.4)
CHLORIDE: 100 meq/L (ref 98–109)
CO2: 22 mEq/L (ref 22–29)
Creatinine: 7 mg/dL (ref 0.6–1.1)
EGFR: 6 mL/min/{1.73_m2} — AB (ref >90)
Glucose: 213 mg/dl — ABNORMAL HIGH (ref 70–140)
POTASSIUM: 4.2 meq/L (ref 3.5–5.1)
Sodium: 139 mEq/L (ref 136–145)
Total Bilirubin: 0.64 mg/dL (ref 0.20–1.20)
Total Protein: 7 g/dL (ref 6.4–8.3)

## 2015-02-10 LAB — CBC WITH DIFFERENTIAL/PLATELET
BASO%: 0.4 % (ref 0.0–2.0)
Basophils Absolute: 0 10*3/uL (ref 0.0–0.1)
EOS%: 0.7 % (ref 0.0–7.0)
Eosinophils Absolute: 0.1 10*3/uL (ref 0.0–0.5)
HEMATOCRIT: 36.2 % (ref 34.8–46.6)
HGB: 11.9 g/dL (ref 11.6–15.9)
LYMPH%: 13.4 % — AB (ref 14.0–49.7)
MCH: 30.1 pg (ref 25.1–34.0)
MCHC: 32.9 g/dL (ref 31.5–36.0)
MCV: 91.4 fL (ref 79.5–101.0)
MONO#: 0.6 10*3/uL (ref 0.1–0.9)
MONO%: 7.4 % (ref 0.0–14.0)
NEUT#: 6.6 10*3/uL — ABNORMAL HIGH (ref 1.5–6.5)
NEUT%: 78.1 % — AB (ref 38.4–76.8)
PLATELETS: 149 10*3/uL (ref 145–400)
RBC: 3.96 10*6/uL (ref 3.70–5.45)
RDW: 17.4 % — ABNORMAL HIGH (ref 11.2–14.5)
WBC: 8.5 10*3/uL (ref 3.9–10.3)
lymph#: 1.1 10*3/uL (ref 0.9–3.3)
nRBC: 0 % (ref 0–0)

## 2015-02-10 MED ORDER — ONDANSETRON HCL 8 MG PO TABS
ORAL_TABLET | ORAL | Status: AC
  Filled 2015-02-10: qty 1

## 2015-02-10 MED ORDER — BORTEZOMIB CHEMO SQ INJECTION 3.5 MG (2.5MG/ML)
1.3000 mg/m2 | Freq: Once | INTRAMUSCULAR | Status: AC
  Administered 2015-02-10: 2.75 mg via SUBCUTANEOUS
  Filled 2015-02-10: qty 2.75

## 2015-02-10 MED ORDER — ONDANSETRON HCL 8 MG PO TABS
8.0000 mg | ORAL_TABLET | Freq: Once | ORAL | Status: AC
  Administered 2015-02-10: 8 mg via ORAL

## 2015-02-10 NOTE — Patient Instructions (Signed)
[facility] Discharge Instructions for Patients Receiving Chemotherapy  Today you received the following chemotherapy agents Velcade  To help prevent nausea and vomiting after your treatment, we encourage you to take your nausea medication Zofran 8 mg every 8 hours as needed.   If you develop nausea and vomiting that is not controlled by your nausea medication, call the clinic.   BELOW ARE SYMPTOMS THAT SHOULD BE REPORTED IMMEDIATELY:  *FEVER GREATER THAN 100.5 F  *CHILLS WITH OR WITHOUT FEVER  NAUSEA AND VOMITING THAT IS NOT CONTROLLED WITH YOUR NAUSEA MEDICATION  *UNUSUAL SHORTNESS OF BREATH  *UNUSUAL BRUISING OR BLEEDING  TENDERNESS IN MOUTH AND THROAT WITH OR WITHOUT PRESENCE OF ULCERS  *URINARY PROBLEMS  *BOWEL PROBLEMS  UNUSUAL RASH Items with * indicate a potential emergency and should be followed up as soon as possible.  Feel free to call the clinic you have any questions or concerns. The clinic phone number is [phone].  Please show the CHEMO ALERT CARD at check-in to the Emergency Department and triage nurse.   

## 2015-02-10 NOTE — Progress Notes (Signed)
Per Dr. Julien Nordmann; OK to treat despite labs

## 2015-02-14 ENCOUNTER — Encounter (HOSPITAL_COMMUNITY): Payer: Self-pay | Admitting: *Deleted

## 2015-02-15 ENCOUNTER — Encounter (HOSPITAL_COMMUNITY)
Admission: RE | Disposition: A | Discharge status: Home / Self Care | Payer: Self-pay | Source: Ambulatory Visit | Attending: Vascular Surgery

## 2015-02-15 ENCOUNTER — Encounter (HOSPITAL_COMMUNITY): Payer: Self-pay | Admitting: Certified Registered Nurse Anesthetist

## 2015-02-15 ENCOUNTER — Ambulatory Visit (HOSPITAL_COMMUNITY): Payer: Medicare Other | Admitting: Certified Registered Nurse Anesthetist

## 2015-02-15 ENCOUNTER — Ambulatory Visit (HOSPITAL_COMMUNITY)
Admission: RE | Admit: 2015-02-15 | Discharge: 2015-02-15 | Disposition: A | Discharge status: Home / Self Care | Payer: Medicare Other | Source: Ambulatory Visit | Attending: Vascular Surgery | Admitting: Vascular Surgery

## 2015-02-15 DIAGNOSIS — N186 End stage renal disease: Secondary | ICD-10-CM | POA: Insufficient documentation

## 2015-02-15 DIAGNOSIS — N185 Chronic kidney disease, stage 5: Secondary | ICD-10-CM | POA: Diagnosis not present

## 2015-02-15 DIAGNOSIS — E039 Hypothyroidism, unspecified: Secondary | ICD-10-CM | POA: Diagnosis not present

## 2015-02-15 DIAGNOSIS — Z992 Dependence on renal dialysis: Secondary | ICD-10-CM | POA: Diagnosis not present

## 2015-02-15 DIAGNOSIS — I129 Hypertensive chronic kidney disease with stage 1 through stage 4 chronic kidney disease, or unspecified chronic kidney disease: Secondary | ICD-10-CM | POA: Diagnosis present

## 2015-02-15 DIAGNOSIS — E1122 Type 2 diabetes mellitus with diabetic chronic kidney disease: Secondary | ICD-10-CM | POA: Diagnosis not present

## 2015-02-15 DIAGNOSIS — Z86711 Personal history of pulmonary embolism: Secondary | ICD-10-CM | POA: Insufficient documentation

## 2015-02-15 DIAGNOSIS — Z794 Long term (current) use of insulin: Secondary | ICD-10-CM | POA: Insufficient documentation

## 2015-02-15 DIAGNOSIS — J449 Chronic obstructive pulmonary disease, unspecified: Secondary | ICD-10-CM | POA: Diagnosis not present

## 2015-02-15 DIAGNOSIS — E1140 Type 2 diabetes mellitus with diabetic neuropathy, unspecified: Secondary | ICD-10-CM | POA: Diagnosis not present

## 2015-02-15 DIAGNOSIS — I120 Hypertensive chronic kidney disease with stage 5 chronic kidney disease or end stage renal disease: Secondary | ICD-10-CM | POA: Diagnosis not present

## 2015-02-15 HISTORY — PX: AV FISTULA PLACEMENT: SHX1204

## 2015-02-15 HISTORY — DX: Constipation, unspecified: K59.00

## 2015-02-15 LAB — POCT I-STAT 4, (NA,K, GLUC, HGB,HCT)
Glucose, Bld: 100 mg/dL — ABNORMAL HIGH (ref 65–99)
HEMATOCRIT: 39 % (ref 36.0–46.0)
HEMOGLOBIN: 13.3 g/dL (ref 12.0–15.0)
POTASSIUM: 5 mmol/L (ref 3.5–5.1)
SODIUM: 137 mmol/L (ref 135–145)

## 2015-02-15 LAB — GLUCOSE, CAPILLARY
GLUCOSE-CAPILLARY: 90 mg/dL (ref 65–99)
Glucose-Capillary: 120 mg/dL — ABNORMAL HIGH (ref 65–99)

## 2015-02-15 SURGERY — INSERTION OF ARTERIOVENOUS (AV) GORE-TEX GRAFT ARM
Anesthesia: General | Site: Arm Upper | Laterality: Right

## 2015-02-15 MED ORDER — PROPOFOL 10 MG/ML IV BOLUS
INTRAVENOUS | Status: AC
  Filled 2015-02-15: qty 20

## 2015-02-15 MED ORDER — LIDOCAINE HCL (CARDIAC) 20 MG/ML IV SOLN
INTRAVENOUS | Status: DC | PRN
  Administered 2015-02-15: 40 mg via INTRAVENOUS

## 2015-02-15 MED ORDER — ONDANSETRON HCL 4 MG/2ML IJ SOLN
INTRAMUSCULAR | Status: DC | PRN
  Administered 2015-02-15: 4 mg via INTRAVENOUS

## 2015-02-15 MED ORDER — 0.9 % SODIUM CHLORIDE (POUR BTL) OPTIME
TOPICAL | Status: DC | PRN
  Administered 2015-02-15: 1000 mL

## 2015-02-15 MED ORDER — FENTANYL CITRATE (PF) 250 MCG/5ML IJ SOLN
INTRAMUSCULAR | Status: AC
  Filled 2015-02-15: qty 5

## 2015-02-15 MED ORDER — ONDANSETRON HCL 4 MG/2ML IJ SOLN
INTRAMUSCULAR | Status: AC
  Filled 2015-02-15: qty 2

## 2015-02-15 MED ORDER — MEPERIDINE HCL 25 MG/ML IJ SOLN: 6.2500 mg | INTRAMUSCULAR | Status: DC | PRN

## 2015-02-15 MED ORDER — VANCOMYCIN HCL IN DEXTROSE 1-5 GM/200ML-% IV SOLN
1000.0000 mg | Freq: Once | INTRAVENOUS | Status: AC
  Administered 2015-02-15: 1000 mg via INTRAVENOUS

## 2015-02-15 MED ORDER — LIDOCAINE HCL (PF) 1 % IJ SOLN
INTRAMUSCULAR | Status: AC
  Filled 2015-02-15: qty 30

## 2015-02-15 MED ORDER — SODIUM CHLORIDE 0.9 % IR SOLN
Status: DC | PRN
  Administered 2015-02-15: 500 mL

## 2015-02-15 MED ORDER — LIDOCAINE HCL (CARDIAC) 20 MG/ML IV SOLN
INTRAVENOUS | Status: AC
  Filled 2015-02-15: qty 5

## 2015-02-15 MED ORDER — PROTAMINE SULFATE 10 MG/ML IV SOLN
INTRAVENOUS | Status: DC | PRN
  Administered 2015-02-15: 40 mg via INTRAVENOUS

## 2015-02-15 MED ORDER — PROTAMINE SULFATE 10 MG/ML IV SOLN
INTRAVENOUS | Status: AC
  Filled 2015-02-15: qty 5

## 2015-02-15 MED ORDER — HEPARIN SODIUM (PORCINE) 1000 UNIT/ML IJ SOLN
INTRAMUSCULAR | Status: AC
  Filled 2015-02-15: qty 1

## 2015-02-15 MED ORDER — HEPARIN SODIUM (PORCINE) 1000 UNIT/ML IJ SOLN
1900.0000 [IU] | Freq: Once | INTRAMUSCULAR | Status: AC
  Administered 2015-02-15: 1900 [IU] via INTRAVENOUS

## 2015-02-15 MED ORDER — PHENYLEPHRINE 40 MCG/ML (10ML) SYRINGE FOR IV PUSH (FOR BLOOD PRESSURE SUPPORT)
PREFILLED_SYRINGE | INTRAVENOUS | Status: AC
  Filled 2015-02-15: qty 10

## 2015-02-15 MED ORDER — CHLORHEXIDINE GLUCONATE CLOTH 2 % EX PADS: 6.0000 | MEDICATED_PAD | Freq: Once | CUTANEOUS | Status: DC

## 2015-02-15 MED ORDER — EPHEDRINE SULFATE 50 MG/ML IJ SOLN
INTRAMUSCULAR | Status: DC | PRN
  Administered 2015-02-15 (×3): 5 mg via INTRAVENOUS

## 2015-02-15 MED ORDER — HYDROMORPHONE HCL 1 MG/ML IJ SOLN
0.2500 mg | INTRAMUSCULAR | Status: DC | PRN
  Administered 2015-02-15: 0.5 mg via INTRAVENOUS

## 2015-02-15 MED ORDER — LIDOCAINE-EPINEPHRINE (PF) 1 %-1:200000 IJ SOLN
INTRAMUSCULAR | Status: AC
  Filled 2015-02-15: qty 30

## 2015-02-15 MED ORDER — PROPOFOL 10 MG/ML IV BOLUS
INTRAVENOUS | Status: DC | PRN
  Administered 2015-02-15: 50 mg via INTRAVENOUS
  Administered 2015-02-15: 150 mg via INTRAVENOUS

## 2015-02-15 MED ORDER — HEPARIN SODIUM (PORCINE) 1000 UNIT/ML IJ SOLN
INTRAMUSCULAR | Status: DC | PRN
  Administered 2015-02-15: 8000 [IU] via INTRAVENOUS

## 2015-02-15 MED ORDER — VANCOMYCIN HCL IN DEXTROSE 1-5 GM/200ML-% IV SOLN
INTRAVENOUS | Status: AC
  Filled 2015-02-15: qty 200

## 2015-02-15 MED ORDER — FENTANYL CITRATE (PF) 100 MCG/2ML IJ SOLN
INTRAMUSCULAR | Status: DC | PRN
  Administered 2015-02-15: 25 ug via INTRAVENOUS
  Administered 2015-02-15: 50 ug via INTRAVENOUS
  Administered 2015-02-15: 25 ug via INTRAVENOUS

## 2015-02-15 MED ORDER — OXYCODONE-ACETAMINOPHEN 5-325 MG PO TABS: 1.0000 | ORAL_TABLET | ORAL | Status: DC | PRN

## 2015-02-15 MED ORDER — THROMBIN 20000 UNITS EX SOLR
CUTANEOUS | Status: AC
  Filled 2015-02-15: qty 20000

## 2015-02-15 MED ORDER — HYDROMORPHONE HCL 1 MG/ML IJ SOLN
INTRAMUSCULAR | Status: AC
  Filled 2015-02-15: qty 1

## 2015-02-15 MED ORDER — ONDANSETRON HCL 4 MG/2ML IJ SOLN: 4.0000 mg | Freq: Once | INTRAMUSCULAR | Status: DC | PRN

## 2015-02-15 MED ORDER — DEXTROSE 5 % IV SOLN: 1.5000 g | INTRAVENOUS | Status: DC

## 2015-02-15 MED ORDER — SODIUM CHLORIDE 0.9 % IV SOLN
INTRAVENOUS | Status: DC
  Administered 2015-02-15: 10 mL/h via INTRAVENOUS
  Administered 2015-02-15 (×2): via INTRAVENOUS

## 2015-02-15 SURGICAL SUPPLY — 36 items
ARMBAND PINK RESTRICT EXTREMIT (MISCELLANEOUS) ×3 IMPLANT
CANISTER SUCTION 2500CC (MISCELLANEOUS) ×3 IMPLANT
CANNULA VESSEL 3MM 2 BLNT TIP (CANNULA) ×3 IMPLANT
CLIP TI MEDIUM 6 (CLIP) ×3 IMPLANT
CLIP TI WIDE RED SMALL 6 (CLIP) ×3 IMPLANT
DECANTER SPIKE VIAL GLASS SM (MISCELLANEOUS) ×3 IMPLANT
ELECT REM PT RETURN 9FT ADLT (ELECTROSURGICAL) ×3
ELECTRODE REM PT RTRN 9FT ADLT (ELECTROSURGICAL) ×1 IMPLANT
GLOVE BIO SURGEON STRL SZ 6.5 (GLOVE) ×1 IMPLANT
GLOVE BIO SURGEON STRL SZ7.5 (GLOVE) ×3 IMPLANT
GLOVE BIO SURGEONS STRL SZ 6.5 (GLOVE) ×1
GLOVE BIOGEL PI IND STRL 6.5 (GLOVE) IMPLANT
GLOVE BIOGEL PI IND STRL 7.0 (GLOVE) IMPLANT
GLOVE BIOGEL PI IND STRL 7.5 (GLOVE) IMPLANT
GLOVE BIOGEL PI IND STRL 8 (GLOVE) ×1 IMPLANT
GLOVE BIOGEL PI INDICATOR 6.5 (GLOVE) ×4
GLOVE BIOGEL PI INDICATOR 7.0 (GLOVE) ×2
GLOVE BIOGEL PI INDICATOR 7.5 (GLOVE) ×2
GLOVE BIOGEL PI INDICATOR 8 (GLOVE) ×2
GLOVE ECLIPSE 7.0 STRL STRAW (GLOVE) ×2 IMPLANT
GOWN STRL REUS W/ TWL LRG LVL3 (GOWN DISPOSABLE) ×3 IMPLANT
GOWN STRL REUS W/TWL LRG LVL3 (GOWN DISPOSABLE) ×9
GRAFT GORETEX STRT 4-7X45 (Vascular Products) ×2 IMPLANT
KIT BASIN OR (CUSTOM PROCEDURE TRAY) ×3 IMPLANT
KIT ROOM TURNOVER OR (KITS) ×3 IMPLANT
LIQUID BAND (GAUZE/BANDAGES/DRESSINGS) ×3 IMPLANT
NS IRRIG 1000ML POUR BTL (IV SOLUTION) ×3 IMPLANT
PACK CV ACCESS (CUSTOM PROCEDURE TRAY) ×3 IMPLANT
PAD ARMBOARD 7.5X6 YLW CONV (MISCELLANEOUS) ×6 IMPLANT
SPONGE SURGIFOAM ABS GEL 100 (HEMOSTASIS) IMPLANT
SUT PROLENE 6 0 BV (SUTURE) ×12 IMPLANT
SUT VIC AB 3-0 SH 27 (SUTURE) ×6
SUT VIC AB 3-0 SH 27X BRD (SUTURE) ×2 IMPLANT
SUT VICRYL 4-0 PS2 18IN ABS (SUTURE) ×6 IMPLANT
UNDERPAD 30X30 INCONTINENT (UNDERPADS AND DIAPERS) ×3 IMPLANT
WATER STERILE IRR 1000ML POUR (IV SOLUTION) ×3 IMPLANT

## 2015-02-15 NOTE — Interval H&P Note (Signed)
History and Physical Interval Note:  02/15/2015 9:53 AM  [name]  has presented today for surgery, with the diagnosis of End Stage Renal Disease N18.6  The various methods of treatment have been discussed with the patient and family. After consideration of risks, benefits and other options for treatment, the patient has consented to  Procedure(s): INSERTION OF ARTERIOVENOUS (AV) GORE-TEX GRAFT ARM (Right) as a surgical intervention .  The patient's history has been reviewed, patient examined, no change in status, stable for surgery.  I have reviewed the patient's chart and labs.  Questions were answered to the patient's satisfaction.     [name]

## 2015-02-15 NOTE — Anesthesia Postprocedure Evaluation (Signed)
Anesthesia Post Note  Patient: [name]  Procedure(s) Performed: Procedure(s) (LRB): INSERTION OF RIGHT ARTERIOVENOUS (AV) GORE-TEX GRAFT ARM (Right)  Anesthesia type: general  Patient location: PACU  Post pain: Pain level controlled  Post assessment: Patient's Cardiovascular Status Stable  Last Vitals:  Filed Vitals:   02/15/15 1241  BP: 129/50  Pulse: 64  Temp: 36.4 C  Resp: 16    Post vital signs: Reviewed and stable  Level of consciousness: sedated  Complications: No apparent anesthesia complications

## 2015-02-15 NOTE — Transfer of Care (Signed)
Immediate Anesthesia Transfer of Care Note  Patient: [name]  Procedure(s) Performed: Procedure(s): INSERTION OF RIGHT ARTERIOVENOUS (AV) GORE-TEX GRAFT ARM (Right)  Patient Location: PACU  Anesthesia Type:General  Level of Consciousness: awake, alert  and oriented  Airway & Oxygen Therapy: Patient Spontanous Breathing  Post-op Assessment: Report given to RN  Post vital signs: Reviewed and stable  Last Vitals:  Filed Vitals:   02/15/15 0827  BP: 151/67  Pulse: 68  Temp: 36.4 C  Resp: 18    Complications: No apparent anesthesia complications

## 2015-02-15 NOTE — Op Note (Signed)
    NAME: [name]   MRN: [id number] DOB: 11-05-1932    DATE OF OPERATION: 02/15/2015  PREOP DIAGNOSIS: Stage V chronic kidney disease  POSTOP DIAGNOSIS: Same  PROCEDURE: New right upper arm loop AV graft (4-7 mm PTFE graft)  SURGEON: Judeth Cornfield. Scot Dock, MD, FACS  ASSIST: Gerri Lins PA  ANESTHESIA: Gen.   EBL: minimal  INDICATIONS: [place] is a 81 y.o. female who presents for new access.  FINDINGS: the brachial artery was small above the antecubital level therefore I placed an upper arm loop graft.  TECHNIQUE: The patient was taken to the operating room and received a general anesthetic. The right upper extremity was prepped and draped in the usual sterile fashion. A longitudinal incision was made above the antecubital level and here the brachial artery was dissected free all that was small. I therefore elected to place an upper arm loop graft. A separate longitudinal incision was made beneath the axilla. Here the high brachial vein and brachial artery were dissected free. A 4-7 mm PTFE graft was tunneled in a loop fashion in the upper arm with the arterial aspect of the graft along the lateral aspect of the upper arm. The patient was heparinized. The brachial artery was clamped proximally and distally and a longitudinal arteriotomy was made. A segment of the 4 mm end of the graft was excised, the graft slightly spatulated, and sewn into side to the artery using continuous 60 proline suture. The graft and poorly appropriately for anastomosis to the high brachial vein. The vein was ligated distally and spatulated proximally. The vein was sewn end-to-side to the graft using continuous 60 proline suture. At the completion was a good thrill in the graft. There was a radial and ulnar signal with the Doppler. The heparin was partially reversed with protamine. Hemostasis was obtained in the wounds.The wounds were closed with a deep layer of 3-0 Vicryl and the skin closed with  4-0 Vicryl. Liquid barium was applied. The patient tolerated the procedure well and transferred to the recovery room in stable condition. All needle and sponge counts were correct.  [name], MD, FACS [facility]  DATE OF DICTATION:   02/15/2015

## 2015-02-15 NOTE — H&P (View-Only) (Signed)
[facility] [place]  Patient name: [name] MRN: [id number] DOB: [birth year] Sex: female  REASON FOR VISIT: Evaluate for new hemodialysis access.  [id number] [name] is a 81 y.o. female had a new right forearm AV graft placed on 10/19/2014.this graft subsequently occluded. On 11/29/2014 the patient underwent attempted thrombolysis at [facility]  A stent had to be placed within the graft. The central and axillary veins were patent. The graft subsequently occluded. He had a catheter placed. She is sent for evaluation for new access.  She dialyzes on Monday Wednesdays and Fridays. She goes to her oncologist every Thursday. Therefore her surgery will have to be done on Tuesday.   Past Medical History  Diagnosis Date  . Hypertension   . Asthma   . GERD (gastroesophageal reflux disease)   . Neuropathy in diabetes     Hx: of  . COPD (chronic obstructive pulmonary disease)   . Anemia   . High cholesterol   . Pulmonary embolism 1987  . Type II diabetes mellitus   . History of blood transfusion 1987; 2011    "related to lung OR; HgB went down to 5"  . Arthritis     "hips, knees, shoulders" (11/10/2013)  . Chronic lower back pain   . Gout   . Multiple myeloma   . Neuropathy   . Hypothyroidism   . ESRD (end stage renal disease) on dialysis     "1st treatment 11/09/2013" MWF  . Kidney stones    Family History  Problem Relation Age of Onset  . Blindness Father   . Diabetes Father   . Diabetes Sister   . Kidney failure Brother    SOCIAL HISTORY: Social History  Substance Use Topics  . Smoking status: Never Smoker   . Smokeless tobacco: Never Used  . Alcohol Use: No   Allergies  Allergen Reactions  . Penicillins Cross Reactors Other (See Comments)    Tongue Swelling   Current Outpatient Prescriptions  Medication Sig Dispense Refill  . acyclovir (ZOVIRAX) 400 MG tablet Take 1 tablet (400 mg total) by mouth daily. 30 tablet 0  . albuterol  (PROVENTIL HFA;VENTOLIN HFA) 108 (90 BASE) MCG/ACT inhaler Inhale 1 puff into the lungs 2 (two) times daily as needed for wheezing or shortness of breath.    . allopurinol (ZYLOPRIM) 100 MG tablet Take 100 mg by mouth daily with breakfast.     . amLODipine (NORVASC) 5 MG tablet Take 5 mg by mouth daily with breakfast. Patient states she only takes medication on days that she does not have dialysis which is 4 days out of the week. Receives dialysis on Monday, Wednesday and Fridays.    . B-D ULTRAFINE III SHORT PEN 31G X 8 MM MISC   4  . Cyanocobalamin (VITAMIN B-12 PO) Take 1 tablet by mouth daily.    Marland Kitchen dexamethasone (DECADRON) 4 MG tablet Take 10 tablets (40 mg total) by mouth once a week. 40 tablet 0  . gabapentin (NEURONTIN) 100 MG capsule Take 100 mg by mouth 3 (three) times daily.    Marland Kitchen glimepiride (AMARYL) 1 MG tablet Take 1 mg by mouth daily with breakfast.     . HYDROcodone-acetaminophen (NORCO) 10-325 MG per tablet TK 1 T PO  Q 6 H PRN P  0  . hydrOXYzine (ATARAX/VISTARIL) 25 MG tablet Take 25 mg by mouth every 12 (twelve) hours as needed for itching.   2  . LEVEMIR FLEXTOUCH 100 UNIT/ML Pen Inject  12 Units into the skin daily as needed (high blood sugar). If Blood sugar is above 110 pt will take  5  . levothyroxine (SYNTHROID, LEVOTHROID) 100 MCG tablet Take 100 mcg by mouth daily.  4  . lidocaine-prilocaine (EMLA) cream Apply 1 application topically as needed (for numbing).     . multivitamin (RENA-VIT) TABS tablet Take 1 tablet by mouth daily.    . ondansetron (ZOFRAN) 8 MG tablet Take 1 tablet (8 mg total) by mouth every 8 (eight) hours as needed for nausea or vomiting. 20 tablet 1  . ONE TOUCH ULTRA TEST test strip     . Oxycodone HCl 20 MG TABS Take 20 mg by mouth every 12 (twelve) hours.     . pantoprazole (PROTONIX) 40 MG tablet Take 40 mg by mouth 2 (two) times daily.     . saxagliptin HCl (ONGLYZA) 2.5 MG TABS tablet Take 2.5 mg by mouth daily with breakfast.     . tiZANidine  (ZANAFLEX) 4 MG tablet     . HYDROcodone-acetaminophen (NORCO) 7.5-325 MG per tablet Take 1 tablet by mouth every 6 (six) hours as needed for moderate pain (10/325mg  changed by Dr. Katherine Roan).      No current facility-administered medications for this visit.   REVIEW OF SYSTEMS: Valu.Nieves ] denotes positive finding; [  ] denotes negative finding  CARDIOVASCULAR:  Valu.Nieves ] chest pain   [ ]  chest pressure   [ ]  palpitations   [ ]  orthopnea   [ ]  dyspnea on exertion   Valu.Nieves ] claudication   [ ]  rest pain   [ ]  DVT   [ ]  phlebitis PULMONARY:   [ ]  productive cough   Valu.Nieves ] asthma   [ ]  wheezing NEUROLOGIC:   [ ]  weakness  [ ]  paresthesias  [ ]  aphasia  [ ]  amaurosis  [ ]  dizziness HEMATOLOGIC:   [ ]  bleeding problems   [ ]  clotting disorders MUSCULOSKELETAL:  [ ]  joint pain   [ ]  joint swelling [ ]  leg swelling GASTROINTESTINAL: [ ]   blood in stool  [ ]   hematemesis GENITOURINARY:  [ ]   dysuria  [ ]   hematuria PSYCHIATRIC:  [ ]  history of major depression INTEGUMENTARY:  [ ]  rashes  [ ]  ulcers CONSTITUTIONAL:  [ ]  fever   [ ]  chills  PHYSICAL EXAM: Filed Vitals:   01/26/15 1444  BP: 133/75  Pulse: 71  Temp: 98.5 F (36.9 C)  TempSrc: Oral  Resp: 16  Height: 5\' 5"  (1.651 m)  Weight: 205 lb (92.987 kg)  SpO2: 98%   GENERAL: The patient is a well-nourished female, in no acute distress. The vital signs are documented above. CARDIAC: There is a regular rate and rhythm.  VASCULAR: she has a palpable right brachial pulse. PULMONARY: There is good air exchange bilaterally without wheezing or rales. SKIN: There are no ulcers or rashes noted. PSYCHIATRIC: The patient has a normal affect.  DATA:  I have reviewed her arterial Doppler study which shows no significant arterial disease.  Vein mapping of the right upper extremity shows a marginal right upper arm cephalic vein and basilic vein. When I looked at this at the time of surgery these veins did not appear adequate.  MEDICAL ISSUES:  STAGE V  CHRONIC KIDNEY DISEASE: I will look at her upper arm cephalic vein and basilic vein again in the right arm but I suspect she will require a right upper arm AV graft. This has to be done on  a Tuesday so we have scheduled this for 02/15/2015. I have discussed the indications and potential complications with the patient today and she is agreeable to proceed.   [name] [facility] [place]: [phone]

## 2015-02-15 NOTE — Anesthesia Procedure Notes (Signed)
Procedure Name: LMA Insertion Date/Time: 02/15/2015 10:29 AM Performed by: Willeen Cass P Pre-anesthesia Checklist: Patient identified, Emergency Drugs available, Suction available, Timeout performed and Patient being monitored Patient Re-evaluated:Patient Re-evaluated prior to inductionOxygen Delivery Method: Circle system utilized Preoxygenation: Pre-oxygenation with 100% oxygen Intubation Type: IV induction LMA: LMA inserted LMA Size: 4.0 Number of attempts: 2 Placement Confirmation: positive ETCO2 and breath sounds checked- equal and bilateral Tube secured with: Tape Dental Injury: Teeth and Oropharynx as per pre-operative assessment  Comments: Joylene Igo SRNA with initial insertion, LMA replaced by Dr. Conrad Strathmore

## 2015-02-15 NOTE — Anesthesia Preprocedure Evaluation (Signed)
Anesthesia Evaluation  Patient identified by MRN, date of birth, ID band Patient awake    Reviewed: Allergy & Precautions, NPO status , Patient's Chart, lab work & pertinent test results  Airway Mallampati: II  TM Distance: >3 FB Neck ROM: Full    Dental   Pulmonary COPD,    Pulmonary exam normal        Cardiovascular hypertension, Pt. on medications Normal cardiovascular exam     Neuro/Psych    GI/Hepatic GERD  Medicated and Controlled,  Endo/Other  diabetes, Type 2, Insulin Dependent, Oral Hypoglycemic Agents  Renal/GU Dialysis and CRFRenal disease     Musculoskeletal   Abdominal   Peds  Hematology   Anesthesia Other Findings   Reproductive/Obstetrics                             Anesthesia Physical Anesthesia Plan  ASA: III  Anesthesia Plan: General   Post-op Pain Management:    Induction: Intravenous  Airway Management Planned: LMA  Additional Equipment:   Intra-op Plan:   Post-operative Plan: Extubation in OR  Informed Consent: I have reviewed the patients History and Physical, chart, labs and discussed the procedure including the risks, benefits and alternatives for the proposed anesthesia with the patient or authorized representative who has indicated his/her understanding and acceptance.     Plan Discussed with: CRNA and Surgeon  Anesthesia Plan Comments:         Anesthesia Quick Evaluation

## 2015-02-16 ENCOUNTER — Other Ambulatory Visit: Payer: Self-pay

## 2015-02-16 ENCOUNTER — Encounter (HOSPITAL_COMMUNITY): Payer: Self-pay | Admitting: Vascular Surgery

## 2015-02-17 ENCOUNTER — Other Ambulatory Visit: Payer: Self-pay | Admitting: Nurse Practitioner

## 2015-02-17 ENCOUNTER — Encounter (HOSPITAL_COMMUNITY): Payer: Self-pay | Admitting: *Deleted

## 2015-02-17 ENCOUNTER — Other Ambulatory Visit (HOSPITAL_BASED_OUTPATIENT_CLINIC_OR_DEPARTMENT_OTHER): Payer: Medicare Other

## 2015-02-17 ENCOUNTER — Telehealth: Payer: Self-pay | Admitting: Internal Medicine

## 2015-02-17 ENCOUNTER — Ambulatory Visit (HOSPITAL_BASED_OUTPATIENT_CLINIC_OR_DEPARTMENT_OTHER): Payer: Medicare Other

## 2015-02-17 VITALS — BP 113/43 | HR 74 | Temp 98.1°F | Resp 18

## 2015-02-17 DIAGNOSIS — C9000 Multiple myeloma not having achieved remission: Secondary | ICD-10-CM

## 2015-02-17 DIAGNOSIS — Z5112 Encounter for antineoplastic immunotherapy: Secondary | ICD-10-CM | POA: Diagnosis not present

## 2015-02-17 LAB — COMPREHENSIVE METABOLIC PANEL (CC13)
ALBUMIN: 3.2 g/dL — AB (ref 3.5–5.0)
ALT: 16 U/L (ref 0–55)
AST: 16 U/L (ref 5–34)
Alkaline Phosphatase: 96 U/L (ref 40–150)
Anion Gap: 13 mEq/L — ABNORMAL HIGH (ref 3–11)
BUN: 32.1 mg/dL — AB (ref 7.0–26.0)
CALCIUM: 9.2 mg/dL (ref 8.4–10.4)
CHLORIDE: 99 meq/L (ref 98–109)
CO2: 25 mEq/L (ref 22–29)
EGFR: 6 mL/min/{1.73_m2} — ABNORMAL LOW (ref >90)
Glucose: 244 mg/dl — ABNORMAL HIGH (ref 70–140)
Potassium: 4.1 mEq/L (ref 3.5–5.1)
Sodium: 137 mEq/L (ref 136–145)
TOTAL PROTEIN: 7 g/dL (ref 6.4–8.3)
Total Bilirubin: 0.39 mg/dL (ref 0.20–1.20)

## 2015-02-17 LAB — CBC WITH DIFFERENTIAL/PLATELET
BASO%: 0.1 % (ref 0.0–2.0)
BASOS ABS: 0 10*3/uL (ref 0.0–0.1)
EOS ABS: 0.1 10*3/uL (ref 0.0–0.5)
EOS%: 0.6 % (ref 0.0–7.0)
HEMATOCRIT: 33.3 % — AB (ref 34.8–46.6)
HEMOGLOBIN: 10.6 g/dL — AB (ref 11.6–15.9)
LYMPH%: 8.7 % — ABNORMAL LOW (ref 14.0–49.7)
MCH: 29.6 pg (ref 25.1–34.0)
MCHC: 31.8 g/dL (ref 31.5–36.0)
MCV: 93 fL (ref 79.5–101.0)
MONO#: 0.5 10*3/uL (ref 0.1–0.9)
MONO%: 4.2 % (ref 0.0–14.0)
NEUT%: 86.4 % — AB (ref 38.4–76.8)
NEUTROS ABS: 9.4 10*3/uL — AB (ref 1.5–6.5)
Platelets: 164 10*3/uL (ref 145–400)
RBC: 3.58 10*6/uL — ABNORMAL LOW (ref 3.70–5.45)
RDW: 17.6 % — ABNORMAL HIGH (ref 11.2–14.5)
WBC: 10.8 10*3/uL — ABNORMAL HIGH (ref 3.9–10.3)
lymph#: 0.9 10*3/uL (ref 0.9–3.3)

## 2015-02-17 MED ORDER — BORTEZOMIB CHEMO SQ INJECTION 3.5 MG (2.5MG/ML)
1.3000 mg/m2 | Freq: Once | INTRAMUSCULAR | Status: AC
  Administered 2015-02-17: 2.75 mg via SUBCUTANEOUS
  Filled 2015-02-17: qty 2.75

## 2015-02-17 MED ORDER — ONDANSETRON HCL 8 MG PO TABS
ORAL_TABLET | ORAL | Status: AC
  Filled 2015-02-17: qty 1

## 2015-02-17 MED ORDER — ONDANSETRON HCL 8 MG PO TABS
8.0000 mg | ORAL_TABLET | Freq: Once | ORAL | Status: AC
Start: 2015-02-17 — End: 2015-02-17
  Administered 2015-02-17: 8 mg via ORAL

## 2015-02-17 NOTE — Patient Instructions (Signed)
[facility] Discharge Instructions for Patients Receiving Chemotherapy  Today you received the following chemotherapy agents Velcade  To help prevent nausea and vomiting after your treatment, we encourage you to take your nausea medication Zofran 8 mg every 8 hours as needed.   If you develop nausea and vomiting that is not controlled by your nausea medication, call the clinic.   BELOW ARE SYMPTOMS THAT SHOULD BE REPORTED IMMEDIATELY:  *FEVER GREATER THAN 100.5 F  *CHILLS WITH OR WITHOUT FEVER  NAUSEA AND VOMITING THAT IS NOT CONTROLLED WITH YOUR NAUSEA MEDICATION  *UNUSUAL SHORTNESS OF BREATH  *UNUSUAL BRUISING OR BLEEDING  TENDERNESS IN MOUTH AND THROAT WITH OR WITHOUT PRESENCE OF ULCERS  *URINARY PROBLEMS  *BOWEL PROBLEMS  UNUSUAL RASH Items with * indicate a potential emergency and should be followed up as soon as possible.  Feel free to call the clinic you have any questions or concerns. The clinic phone number is [phone].  Please show the CHEMO ALERT CARD at check-in to the Emergency Department and triage nurse.   

## 2015-02-17 NOTE — Telephone Encounter (Signed)
Gave adn printed pt new sched

## 2015-02-17 NOTE — Progress Notes (Signed)
Pt denies any recent chest pain or sob.  

## 2015-02-18 ENCOUNTER — Encounter (HOSPITAL_COMMUNITY): Payer: Self-pay | Admitting: Certified Registered Nurse Anesthetist

## 2015-02-18 ENCOUNTER — Ambulatory Visit (HOSPITAL_COMMUNITY): Payer: Medicare Other | Admitting: Certified Registered Nurse Anesthetist

## 2015-02-18 ENCOUNTER — Encounter (HOSPITAL_COMMUNITY)
Admission: RE | Disposition: A | Discharge status: Home / Self Care | Payer: Self-pay | Source: Ambulatory Visit | Attending: Vascular Surgery

## 2015-02-18 ENCOUNTER — Ambulatory Visit (HOSPITAL_COMMUNITY)
Admission: RE | Admit: 2015-02-18 | Discharge: 2015-02-18 | Disposition: A | Discharge status: Home / Self Care | Payer: Medicare Other | Source: Ambulatory Visit | Attending: Vascular Surgery | Admitting: Vascular Surgery

## 2015-02-18 DIAGNOSIS — J449 Chronic obstructive pulmonary disease, unspecified: Secondary | ICD-10-CM | POA: Diagnosis not present

## 2015-02-18 DIAGNOSIS — E1122 Type 2 diabetes mellitus with diabetic chronic kidney disease: Secondary | ICD-10-CM | POA: Diagnosis not present

## 2015-02-18 DIAGNOSIS — Z79899 Other long term (current) drug therapy: Secondary | ICD-10-CM | POA: Diagnosis not present

## 2015-02-18 DIAGNOSIS — Y832 Surgical operation with anastomosis, bypass or graft as the cause of abnormal reaction of the patient, or of later complication, without mention of misadventure at the time of the procedure: Secondary | ICD-10-CM | POA: Insufficient documentation

## 2015-02-18 DIAGNOSIS — J45909 Unspecified asthma, uncomplicated: Secondary | ICD-10-CM | POA: Diagnosis not present

## 2015-02-18 DIAGNOSIS — T82868A Thrombosis of vascular prosthetic devices, implants and grafts, initial encounter: Secondary | ICD-10-CM | POA: Diagnosis not present

## 2015-02-18 DIAGNOSIS — C9000 Multiple myeloma not having achieved remission: Secondary | ICD-10-CM | POA: Diagnosis not present

## 2015-02-18 DIAGNOSIS — Z6834 Body mass index (BMI) 34.0-34.9, adult: Secondary | ICD-10-CM | POA: Diagnosis not present

## 2015-02-18 DIAGNOSIS — I120 Hypertensive chronic kidney disease with stage 5 chronic kidney disease or end stage renal disease: Secondary | ICD-10-CM | POA: Diagnosis not present

## 2015-02-18 DIAGNOSIS — E1140 Type 2 diabetes mellitus with diabetic neuropathy, unspecified: Secondary | ICD-10-CM | POA: Diagnosis not present

## 2015-02-18 DIAGNOSIS — Z992 Dependence on renal dialysis: Secondary | ICD-10-CM | POA: Diagnosis not present

## 2015-02-18 DIAGNOSIS — Z86711 Personal history of pulmonary embolism: Secondary | ICD-10-CM | POA: Diagnosis not present

## 2015-02-18 DIAGNOSIS — E780 Pure hypercholesterolemia: Secondary | ICD-10-CM | POA: Insufficient documentation

## 2015-02-18 DIAGNOSIS — K219 Gastro-esophageal reflux disease without esophagitis: Secondary | ICD-10-CM | POA: Diagnosis not present

## 2015-02-18 DIAGNOSIS — E039 Hypothyroidism, unspecified: Secondary | ICD-10-CM | POA: Insufficient documentation

## 2015-02-18 DIAGNOSIS — Z79891 Long term (current) use of opiate analgesic: Secondary | ICD-10-CM | POA: Insufficient documentation

## 2015-02-18 DIAGNOSIS — M109 Gout, unspecified: Secondary | ICD-10-CM | POA: Insufficient documentation

## 2015-02-18 DIAGNOSIS — N186 End stage renal disease: Secondary | ICD-10-CM | POA: Diagnosis not present

## 2015-02-18 DIAGNOSIS — Z794 Long term (current) use of insulin: Secondary | ICD-10-CM | POA: Diagnosis not present

## 2015-02-18 HISTORY — PX: THROMBECTOMY W/ EMBOLECTOMY: SHX2507

## 2015-02-18 LAB — POCT I-STAT 4, (NA,K, GLUC, HGB,HCT)
GLUCOSE: 60 mg/dL — AB (ref 65–99)
HEMATOCRIT: 36 % (ref 36.0–46.0)
Hemoglobin: 12.2 g/dL (ref 12.0–15.0)
Potassium: 4.8 mmol/L (ref 3.5–5.1)
Sodium: 135 mmol/L (ref 135–145)

## 2015-02-18 LAB — GLUCOSE, CAPILLARY
GLUCOSE-CAPILLARY: 67 mg/dL (ref 65–99)
GLUCOSE-CAPILLARY: 127 mg/dL — AB (ref 65–99)
GLUCOSE-CAPILLARY: 129 mg/dL — AB (ref 65–99)
Glucose-Capillary: 56 mg/dL — ABNORMAL LOW (ref 65–99)

## 2015-02-18 SURGERY — THROMBECTOMY ARTERIOVENOUS GORE-TEX GRAFT
Anesthesia: General | Site: Arm Upper | Laterality: Right

## 2015-02-18 MED ORDER — 0.9 % SODIUM CHLORIDE (POUR BTL) OPTIME
TOPICAL | Status: DC | PRN
  Administered 2015-02-18: 1000 mL

## 2015-02-18 MED ORDER — GLUCOSE 40 % PO GEL: 1.0000 | Freq: Once | ORAL | Status: DC

## 2015-02-18 MED ORDER — PROMETHAZINE HCL 25 MG/ML IJ SOLN
6.2500 mg | INTRAMUSCULAR | Status: DC | PRN
Start: 2015-02-18 — End: 2015-02-18
  Administered 2015-02-18: 6.25 mg via INTRAVENOUS

## 2015-02-18 MED ORDER — HYDROMORPHONE HCL 1 MG/ML IJ SOLN
INTRAMUSCULAR | Status: AC
  Administered 2015-02-18: 0.5 mg via INTRAVENOUS
  Filled 2015-02-18: qty 1

## 2015-02-18 MED ORDER — DEXAMETHASONE SODIUM PHOSPHATE 4 MG/ML IJ SOLN
INTRAMUSCULAR | Status: DC | PRN
  Administered 2015-02-18: 4 mg via INTRAVENOUS

## 2015-02-18 MED ORDER — MIDAZOLAM HCL 2 MG/2ML IJ SOLN
INTRAMUSCULAR | Status: AC
  Filled 2015-02-18: qty 4

## 2015-02-18 MED ORDER — EPHEDRINE SULFATE 50 MG/ML IJ SOLN
INTRAMUSCULAR | Status: AC
  Filled 2015-02-18: qty 1

## 2015-02-18 MED ORDER — MEPERIDINE HCL 25 MG/ML IJ SOLN: 6.2500 mg | INTRAMUSCULAR | Status: DC | PRN

## 2015-02-18 MED ORDER — DEXTROSE 50 % IV SOLN
INTRAVENOUS | Status: AC
  Filled 2015-02-18: qty 50

## 2015-02-18 MED ORDER — PROPOFOL 10 MG/ML IV BOLUS
INTRAVENOUS | Status: AC
  Filled 2015-02-18: qty 20

## 2015-02-18 MED ORDER — FENTANYL CITRATE (PF) 250 MCG/5ML IJ SOLN
INTRAMUSCULAR | Status: AC
  Filled 2015-02-18: qty 5

## 2015-02-18 MED ORDER — FENTANYL CITRATE (PF) 100 MCG/2ML IJ SOLN
INTRAMUSCULAR | Status: DC | PRN
  Administered 2015-02-18: 25 ug via INTRAVENOUS

## 2015-02-18 MED ORDER — MIDAZOLAM HCL 5 MG/5ML IJ SOLN
INTRAMUSCULAR | Status: DC | PRN
  Administered 2015-02-18 (×2): 1 mg via INTRAVENOUS

## 2015-02-18 MED ORDER — SODIUM CHLORIDE 0.9 % IV SOLN
INTRAVENOUS | Status: DC
  Administered 2015-02-18: 08:00:00 via INTRAVENOUS

## 2015-02-18 MED ORDER — MIDAZOLAM HCL 2 MG/2ML IJ SOLN
0.5000 mg | Freq: Once | INTRAMUSCULAR | Status: AC
Start: 2015-02-18 — End: 2015-02-18
  Administered 2015-02-18: 0.5 mg via INTRAVENOUS

## 2015-02-18 MED ORDER — HYDROMORPHONE HCL 1 MG/ML IJ SOLN
0.2500 mg | INTRAMUSCULAR | Status: DC | PRN
Start: 2015-02-18 — End: 2015-02-18
  Administered 2015-02-18 (×4): 0.5 mg via INTRAVENOUS

## 2015-02-18 MED ORDER — SUCCINYLCHOLINE CHLORIDE 20 MG/ML IJ SOLN
INTRAMUSCULAR | Status: AC
  Filled 2015-02-18: qty 1

## 2015-02-18 MED ORDER — DEXTROSE 50 % IV SOLN: 25.0000 mL | Freq: Once | INTRAVENOUS | Status: DC

## 2015-02-18 MED ORDER — STERILE WATER FOR INJECTION IJ SOLN
INTRAMUSCULAR | Status: AC
  Filled 2015-02-18: qty 10

## 2015-02-18 MED ORDER — GLUCOSE 40 % PO GEL
ORAL | Status: AC
  Filled 2015-02-18: qty 1

## 2015-02-18 MED ORDER — LIDOCAINE-EPINEPHRINE (PF) 1 %-1:200000 IJ SOLN
INTRAMUSCULAR | Status: DC | PRN
  Administered 2015-02-18: 30 mL

## 2015-02-18 MED ORDER — LIDOCAINE HCL (CARDIAC) 20 MG/ML IV SOLN
INTRAVENOUS | Status: DC | PRN
  Administered 2015-02-18: 2.5 mL via INTRAVENOUS

## 2015-02-18 MED ORDER — ALBUTEROL SULFATE HFA 108 (90 BASE) MCG/ACT IN AERS
INHALATION_SPRAY | RESPIRATORY_TRACT | Status: AC
  Filled 2015-02-18: qty 6.7

## 2015-02-18 MED ORDER — MIDAZOLAM HCL 2 MG/2ML IJ SOLN
INTRAMUSCULAR | Status: AC
  Filled 2015-02-18: qty 2

## 2015-02-18 MED ORDER — PROPOFOL 10 MG/ML IV BOLUS
INTRAVENOUS | Status: DC | PRN
  Administered 2015-02-18: 150 mg via INTRAVENOUS

## 2015-02-18 MED ORDER — DEXAMETHASONE SODIUM PHOSPHATE 4 MG/ML IJ SOLN
INTRAMUSCULAR | Status: AC
  Filled 2015-02-18: qty 1

## 2015-02-18 MED ORDER — OXYCODONE-ACETAMINOPHEN 5-325 MG PO TABS
2.0000 | ORAL_TABLET | Freq: Once | ORAL | Status: AC
  Administered 2015-02-18: 2 via ORAL

## 2015-02-18 MED ORDER — ALBUTEROL SULFATE HFA 108 (90 BASE) MCG/ACT IN AERS
INHALATION_SPRAY | RESPIRATORY_TRACT | Status: DC | PRN
  Administered 2015-02-18: 6 via RESPIRATORY_TRACT

## 2015-02-18 MED ORDER — ROCURONIUM BROMIDE 50 MG/5ML IV SOLN
INTRAVENOUS | Status: AC
  Filled 2015-02-18: qty 1

## 2015-02-18 MED ORDER — PHENYLEPHRINE HCL 10 MG/ML IJ SOLN
INTRAMUSCULAR | Status: DC | PRN
  Administered 2015-02-18 (×6): 40 ug via INTRAVENOUS

## 2015-02-18 MED ORDER — SODIUM CHLORIDE 0.9 % IV SOLN
INTRAVENOUS | Status: DC | PRN
  Administered 2015-02-18: 500 mL

## 2015-02-18 MED ORDER — PHENYLEPHRINE 40 MCG/ML (10ML) SYRINGE FOR IV PUSH (FOR BLOOD PRESSURE SUPPORT)
PREFILLED_SYRINGE | INTRAVENOUS | Status: AC
  Filled 2015-02-18: qty 10

## 2015-02-18 MED ORDER — LIDOCAINE-EPINEPHRINE (PF) 1 %-1:200000 IJ SOLN
INTRAMUSCULAR | Status: AC
Start: 2015-02-18 — End: 2015-02-18
  Filled 2015-02-18: qty 30

## 2015-02-18 MED ORDER — DEXTROSE 5 % IV SOLN
INTRAVENOUS | Status: AC
  Filled 2015-02-18: qty 1.5

## 2015-02-18 MED ORDER — OXYCODONE-ACETAMINOPHEN 5-325 MG PO TABS
ORAL_TABLET | ORAL | Status: AC
  Administered 2015-02-18: 2 via ORAL
  Filled 2015-02-18: qty 2

## 2015-02-18 MED ORDER — PROMETHAZINE HCL 25 MG/ML IJ SOLN
INTRAMUSCULAR | Status: AC
  Administered 2015-02-18: 6.25 mg via INTRAVENOUS
  Filled 2015-02-18: qty 1

## 2015-02-18 MED ORDER — DEXTROSE 5 % IV SOLN: 1.5000 g | INTRAVENOUS | Status: DC

## 2015-02-18 MED ORDER — EPHEDRINE SULFATE 50 MG/ML IJ SOLN
INTRAMUSCULAR | Status: DC | PRN
  Administered 2015-02-18: 5 mg via INTRAVENOUS

## 2015-02-18 MED ORDER — VANCOMYCIN HCL IN DEXTROSE 1-5 GM/200ML-% IV SOLN
INTRAVENOUS | Status: AC
  Administered 2015-02-18: 1000 mg via INTRAVENOUS
  Filled 2015-02-18: qty 200

## 2015-02-18 MED ORDER — ONDANSETRON HCL 4 MG/2ML IJ SOLN
INTRAMUSCULAR | Status: DC | PRN
Start: 2015-02-18 — End: 2015-02-18
  Administered 2015-02-18: 4 mg via INTRAVENOUS

## 2015-02-18 MED ORDER — CHLORHEXIDINE GLUCONATE CLOTH 2 % EX PADS: 6.0000 | MEDICATED_PAD | Freq: Once | CUTANEOUS | Status: DC

## 2015-02-18 SURGICAL SUPPLY — 28 items
ARMBAND PINK RESTRICT EXTREMIT (MISCELLANEOUS) ×3 IMPLANT
CANISTER SUCTION 2500CC (MISCELLANEOUS) ×3 IMPLANT
CATH EMB 5FR 80CM (CATHETERS) ×3 IMPLANT
CLIP TI MEDIUM 6 (CLIP) ×3 IMPLANT
CLIP TI WIDE RED SMALL 6 (CLIP) ×3 IMPLANT
COVER SURGICAL LIGHT HANDLE (MISCELLANEOUS) ×2 IMPLANT
DRAPE PROXIMA HALF (DRAPES) ×2 IMPLANT
ELECT REM PT RETURN 9FT ADLT (ELECTROSURGICAL) ×3
ELECTRODE REM PT RTRN 9FT ADLT (ELECTROSURGICAL) ×1 IMPLANT
GEL ULTRASOUND 20GR AQUASONIC (MISCELLANEOUS) IMPLANT
GLOVE BIO SURGEON STRL SZ7.5 (GLOVE) ×2 IMPLANT
GLOVE SS BIOGEL STRL SZ 7 (GLOVE) ×1 IMPLANT
GLOVE SUPERSENSE BIOGEL SZ 7 (GLOVE) ×4
GOWN STRL REUS W/ TWL LRG LVL3 (GOWN DISPOSABLE) ×3 IMPLANT
GOWN STRL REUS W/TWL LRG LVL3 (GOWN DISPOSABLE) ×9
KIT BASIN OR (CUSTOM PROCEDURE TRAY) ×3 IMPLANT
KIT ROOM TURNOVER OR (KITS) ×3 IMPLANT
LIQUID BAND (GAUZE/BANDAGES/DRESSINGS) ×3 IMPLANT
NS IRRIG 1000ML POUR BTL (IV SOLUTION) ×3 IMPLANT
PACK CV ACCESS (CUSTOM PROCEDURE TRAY) ×3 IMPLANT
PAD ARMBOARD 7.5X6 YLW CONV (MISCELLANEOUS) ×6 IMPLANT
SUT PROLENE 6 0 BV (SUTURE) ×5 IMPLANT
SUT VIC AB 3-0 SH 27 (SUTURE) ×6
SUT VIC AB 3-0 SH 27X BRD (SUTURE) ×1 IMPLANT
SYR 3ML LL SCALE MARK (SYRINGE) ×2 IMPLANT
SYR TB 1ML LUER SLIP (SYRINGE) ×2 IMPLANT
UNDERPAD 30X30 INCONTINENT (UNDERPADS AND DIAPERS) ×3 IMPLANT
WATER STERILE IRR 1000ML POUR (IV SOLUTION) ×3 IMPLANT

## 2015-02-18 NOTE — Anesthesia Preprocedure Evaluation (Signed)
Anesthesia Evaluation  Patient identified by MRN, date of birth, ID band Patient awake    Reviewed: Allergy & Precautions, NPO status   Airway Mallampati: I  TM Distance: >3 FB Neck ROM: Full    Dental  (+) Poor Dentition, Missing   Pulmonary asthma ,    breath sounds clear to auscultation       Cardiovascular hypertension,  Rhythm:Regular Rate:Normal     Neuro/Psych negative neurological ROS     GI/Hepatic GERD  ,  Endo/Other  diabetes, Well ControlledHypothyroidism Morbid obesity  Renal/GU ESRF and DialysisRenal disease     Musculoskeletal  (+) Arthritis ,   Abdominal (+) + obese,   Peds  Hematology Multiple myeloma    Anesthesia Other Findings   Reproductive/Obstetrics                             Anesthesia Physical  Anesthesia Plan  ASA: III  Anesthesia Plan: MAC   Post-op Pain Management:    Induction: Intravenous  Airway Management Planned:   Additional Equipment:   Intra-op Plan:   Post-operative Plan:   Informed Consent: I have reviewed the patients History and Physical, chart, labs and discussed the procedure including the risks, benefits and alternatives for the proposed anesthesia with the patient or authorized representative who has indicated his/her understanding and acceptance.   Dental advisory given  Plan Discussed with: CRNA and Surgeon  Anesthesia Plan Comments:         Anesthesia Quick Evaluation                                  Anesthesia Evaluation  Patient identified by MRN, date of birth, ID band Patient awake    Reviewed: Allergy & Precautions, NPO status , Patient's Chart, lab work & pertinent test results  Airway Mallampati: II  TM Distance: >3 FB Neck ROM: Full    Dental   Pulmonary COPD,    Pulmonary exam normal        Cardiovascular hypertension, Pt. on medications Normal cardiovascular exam      Neuro/Psych    GI/Hepatic GERD  Medicated and Controlled,  Endo/Other  diabetes, Type 2, Insulin Dependent, Oral Hypoglycemic Agents  Renal/GU Dialysis and CRFRenal disease     Musculoskeletal   Abdominal   Peds  Hematology   Anesthesia Other Findings   Reproductive/Obstetrics                             Anesthesia Physical Anesthesia Plan  ASA: III  Anesthesia Plan: General   Post-op Pain Management:    Induction: Intravenous  Airway Management Planned: LMA  Additional Equipment:   Intra-op Plan:   Post-operative Plan: Extubation in OR  Informed Consent: I have reviewed the patients History and Physical, chart, labs and discussed the procedure including the risks, benefits and alternatives for the proposed anesthesia with the patient or authorized representative who has indicated his/her understanding and acceptance.     Plan Discussed with: CRNA and Surgeon  Anesthesia Plan Comments:         Anesthesia Quick Evaluation

## 2015-02-18 NOTE — Interval H&P Note (Signed)
History and Physical Interval Note:  02/18/2015 7:32 AM  [name]  has presented today for surgery, with the diagnosis of clotted right arm AVG  T82.818A  The various methods of treatment have been discussed with the patient and family. After consideration of risks, benefits and other options for treatment, the patient has consented to  Procedure(s): THROMBECTOMY ARTERIOVENOUS GORE-TEX GRAFT (Right) as a surgical intervention .  The patient's history has been reviewed, patient examined, no change in status, stable for surgery.  I have reviewed the patient's chart and labs.  Questions were answered to the patient's satisfaction.     Tinnie Gens

## 2015-02-18 NOTE — Transfer of Care (Signed)
Immediate Anesthesia Transfer of Care Note  Patient: [name]  Procedure(s) Performed: Procedure(s): THROMBECTOMY RIGHT UPPER ARM ARTERIOVENOUS GORE-TEX GRAFT with revision of arterial anastamosis (Right)  Patient Location: PACU  Anesthesia Type:General  Level of Consciousness: awake, alert  and oriented  Airway & Oxygen Therapy: Patient Spontanous Breathing and Patient connected to nasal cannula oxygen  Post-op Assessment: Report given to RN and Post -op Vital signs reviewed and stable  Post vital signs: Reviewed and stable  Last Vitals:  Filed Vitals:   02/18/15 0914  BP:   Pulse:   Temp: 36.6 C  Resp:     Complications: No apparent anesthesia complications

## 2015-02-18 NOTE — Progress Notes (Signed)
1.9cc of heparin ( 10000 units/ML)  to blue port of HD cath; dressing dry ,clean and intact.

## 2015-02-18 NOTE — Op Note (Signed)
OPERATIVE REPORT  Date of Surgery: 02/18/2015  Surgeon: Tinnie Gens, MD  Assistant: Nurse  Pre-op Diagnosis: clotted right arm arteriovenous graft  T82.818A  Post-op Diagnosis: thrombus right upper arm arteriovenous graft, End Stage Renal Disease  Procedure: Procedure(s): THROMBECTOMY RIGHT UPPER ARM ARTERIOVENOUS GORE-TEX GRAFT with revision of arterial anastamosis  Anesthesia: LMA  EBL: Minimal  Complications: None  Procedure Details: The patient was taken the operating room placed in the supine position at which time satisfactory general-LMA anesthesia was administered. The right upper extremity was prepped with Betadine scrub and solution draped in routine sterile manner. A right upper arm looped AV Gore-Tex graft had been placed by Dr. Gae Gallop  2 days earlier and had thrombosed. The incision in the proximal upper arm near the axilla was reopened and the arterial and venous anastomoses easily identified. Proximal distal control was obtained 30 brachial artery and the brachial venous anastomosis was an end end anastomosis. The graft was occluded. The vein was of large caliber at least 5-6 mm in size with good match with the Gore-Tex graft. This was not felt to be the problem. Therefore the brachial artery was occluded proximally and distally with vascular clamps and the anastomosis was taken down. There was excellent inflow and backbleeding and the brachial artery. All thrombus in the anastomotic area was irrigated with heparin saline. A 5 Fogarty catheter was passed around the graft easily through the venous anastomosis under direct vision and into the central veins with no stenosis noted and excellent backbleeding following thrombectomy of the graft. This was performed on multiple occasions. Graft was flushed with heparin saline. Following this the artery was extended slightly and the previous 4 mm segment of the graft excised and a new spatulation of the graft was performed making a  slightly larger arterial anastomosis. End-to-side anastomosis was done with 60 proline clamps released there was an excellent pulse and thrill in the graft. There was excellent arterial flow in the radial and ulnar arteries distally which slightly diminished with the graft open but did not disappear. Adequate hemostasis was achieved wound closed in layers with Vicryls septic or fashion with Dermabond patient taken to recovery in satisfactory condition   Tinnie Gens, MD 02/18/2015 9:09 AM

## 2015-02-18 NOTE — H&P (View-Only) (Signed)
[facility] [place]  Patient name: [name] MRN: [id number] DOB: November 18, 1932 Sex: female  REASON FOR VISIT: Evaluate for new hemodialysis access.  [name] is a 81 y.o. female had a new right forearm AV graft placed on 10/19/2014.this graft subsequently occluded. On 11/29/2014 the patient underwent attempted thrombolysis at [facility]  A stent had to be placed within the graft. The central and axillary veins were patent. The graft subsequently occluded. He had a catheter placed. She is sent for evaluation for new access.  She dialyzes on Monday Wednesdays and Fridays. She goes to her oncologist every Thursday. Therefore her surgery will have to be done on Tuesday.   Past Medical History  Diagnosis Date  . Hypertension   . Asthma   . GERD (gastroesophageal reflux disease)   . Neuropathy in diabetes     Hx: of  . COPD (chronic obstructive pulmonary disease)   . Anemia   . High cholesterol   . Pulmonary embolism 1987  . Type II diabetes mellitus   . History of blood transfusion 1987; 2011    "related to lung OR; HgB went down to 5"  . Arthritis     "hips, knees, shoulders" (11/10/2013)  . Chronic lower back pain   . Gout   . Multiple myeloma   . Neuropathy   . Hypothyroidism   . ESRD (end stage renal disease) on dialysis     "1st treatment 11/09/2013" MWF  . Kidney stones    Family History  Problem Relation Age of Onset  . Blindness Father   . Diabetes Father   . Diabetes Sister   . Kidney failure Brother    SOCIAL HISTORY: Social History  Substance Use Topics  . Smoking status: Never Smoker   . Smokeless tobacco: Never Used  . Alcohol Use: No   Allergies  Allergen Reactions  . Penicillins Cross Reactors Other (See Comments)    Tongue Swelling   Current Outpatient Prescriptions  Medication Sig Dispense Refill  . acyclovir (ZOVIRAX) 400 MG tablet Take 1 tablet (400 mg total) by mouth daily. 30 tablet 0  . albuterol  (PROVENTIL HFA;VENTOLIN HFA) 108 (90 BASE) MCG/ACT inhaler Inhale 1 puff into the lungs 2 (two) times daily as needed for wheezing or shortness of breath.    . allopurinol (ZYLOPRIM) 100 MG tablet Take 100 mg by mouth daily with breakfast.     . amLODipine (NORVASC) 5 MG tablet Take 5 mg by mouth daily with breakfast. Patient states she only takes medication on days that she does not have dialysis which is 4 days out of the week. Receives dialysis on Monday, Wednesday and Fridays.    . B-D ULTRAFINE III SHORT PEN 31G X 8 MM MISC   4  . Cyanocobalamin (VITAMIN B-12 PO) Take 1 tablet by mouth daily.    Marland Kitchen dexamethasone (DECADRON) 4 MG tablet Take 10 tablets (40 mg total) by mouth once a week. 40 tablet 0  . gabapentin (NEURONTIN) 100 MG capsule Take 100 mg by mouth 3 (three) times daily.    Marland Kitchen glimepiride (AMARYL) 1 MG tablet Take 1 mg by mouth daily with breakfast.     . HYDROcodone-acetaminophen (NORCO) 10-325 MG per tablet TK 1 T PO  Q 6 H PRN P  0  . hydrOXYzine (ATARAX/VISTARIL) 25 MG tablet Take 25 mg by mouth every 12 (twelve) hours as needed for itching.   2  . LEVEMIR FLEXTOUCH 100 UNIT/ML Pen Inject  12 Units into the skin daily as needed (high blood sugar). If Blood sugar is above 110 pt will take  5  . levothyroxine (SYNTHROID, LEVOTHROID) 100 MCG tablet Take 100 mcg by mouth daily.  4  . lidocaine-prilocaine (EMLA) cream Apply 1 application topically as needed (for numbing).     . multivitamin (RENA-VIT) TABS tablet Take 1 tablet by mouth daily.    . ondansetron (ZOFRAN) 8 MG tablet Take 1 tablet (8 mg total) by mouth every 8 (eight) hours as needed for nausea or vomiting. 20 tablet 1  . ONE TOUCH ULTRA TEST test strip     . Oxycodone HCl 20 MG TABS Take 20 mg by mouth every 12 (twelve) hours.     . pantoprazole (PROTONIX) 40 MG tablet Take 40 mg by mouth 2 (two) times daily.     . saxagliptin HCl (ONGLYZA) 2.5 MG TABS tablet Take 2.5 mg by mouth daily with breakfast.     . tiZANidine  (ZANAFLEX) 4 MG tablet     . HYDROcodone-acetaminophen (NORCO) 7.5-325 MG per tablet Take 1 tablet by mouth every 6 (six) hours as needed for moderate pain (10/325mg  changed by Dr. Katherine Roan).      No current facility-administered medications for this visit.   REVIEW OF SYSTEMS: Valu.Nieves ] denotes positive finding; [  ] denotes negative finding  CARDIOVASCULAR:  Valu.Nieves ] chest pain   [ ]  chest pressure   [ ]  palpitations   [ ]  orthopnea   [ ]  dyspnea on exertion   Valu.Nieves ] claudication   [ ]  rest pain   [ ]  DVT   [ ]  phlebitis PULMONARY:   [ ]  productive cough   Valu.Nieves ] asthma   [ ]  wheezing NEUROLOGIC:   [ ]  weakness  [ ]  paresthesias  [ ]  aphasia  [ ]  amaurosis  [ ]  dizziness HEMATOLOGIC:   [ ]  bleeding problems   [ ]  clotting disorders MUSCULOSKELETAL:  [ ]  joint pain   [ ]  joint swelling [ ]  leg swelling GASTROINTESTINAL: [ ]   blood in stool  [ ]   hematemesis GENITOURINARY:  [ ]   dysuria  [ ]   hematuria PSYCHIATRIC:  [ ]  history of major depression INTEGUMENTARY:  [ ]  rashes  [ ]  ulcers CONSTITUTIONAL:  [ ]  fever   [ ]  chills  PHYSICAL EXAM: Filed Vitals:   01/26/15 1444  BP: 133/75  Pulse: 71  Temp: 98.5 F (36.9 C)  TempSrc: Oral  Resp: 16  Height: 5\' 5"  (1.651 m)  Weight: 205 lb (92.987 kg)  SpO2: 98%   GENERAL: The patient is a well-nourished female, in no acute distress. The vital signs are documented above. CARDIAC: There is a regular rate and rhythm.  VASCULAR: she has a palpable right brachial pulse. PULMONARY: There is good air exchange bilaterally without wheezing or rales. SKIN: There are no ulcers or rashes noted. PSYCHIATRIC: The patient has a normal affect.  DATA:  I have reviewed her arterial Doppler study which shows no significant arterial disease.  Vein mapping of the right upper extremity shows a marginal right upper arm cephalic vein and basilic vein. When I looked at this at the time of surgery these veins did not appear adequate.  MEDICAL ISSUES:  STAGE V  CHRONIC KIDNEY DISEASE: I will look at her upper arm cephalic vein and basilic vein again in the right arm but I suspect she will require a right upper arm AV graft. This has to be done on  a Tuesday so we have scheduled this for 02/15/2015. I have discussed the indications and potential complications with the patient today and she is agreeable to proceed.   [name] [facility] [place]: [phone]

## 2015-02-18 NOTE — Progress Notes (Signed)
Assessed dialysis catheter on L side of chest, withdrew 65mls. Then NS attached, infusing at 10 mls./ hr.

## 2015-02-18 NOTE — Anesthesia Procedure Notes (Signed)
Procedure Name: LMA Insertion Date/Time: 02/18/2015 7:47 AM Performed by: Ollen Bowl Pre-anesthesia Checklist: Patient identified, Emergency Drugs available, Suction available, Patient being monitored and Timeout performed Patient Re-evaluated:Patient Re-evaluated prior to inductionOxygen Delivery Method: Circle system utilized and Simple face mask Preoxygenation: Pre-oxygenation with 100% oxygen Intubation Type: IV induction Ventilation: Mask ventilation without difficulty LMA: LMA inserted LMA Size: 5.0 Number of attempts: 1 Airway Equipment and Method: Patient positioned with wedge pillow Placement Confirmation: positive ETCO2 and breath sounds checked- equal and bilateral Tube secured with: Tape Dental Injury: Teeth and Oropharynx as per pre-operative assessment

## 2015-02-18 NOTE — Anesthesia Postprocedure Evaluation (Signed)
Anesthesia Post Note  Patient: [name]  Procedure(s) Performed: Procedure(s) (LRB): THROMBECTOMY RIGHT UPPER ARM ARTERIOVENOUS GORE-TEX GRAFT with revision of arterial anastamosis (Right)  Anesthesia type: General  Patient location: PACU  Post pain: Pain level controlled  Post assessment: Post-op Vital signs reviewed  Last Vitals: BP 128/69 mmHg  Pulse 64  Temp(Src) 36.7 C (Oral)  Resp 16  Ht 5\' 5"  (1.651 m)  Wt 209 lb (94.802 kg)  BMI 34.78 kg/m2  SpO2 98%  Post vital signs: Reviewed  Level of consciousness: sedated  Complications: No apparent anesthesia complications

## 2015-02-21 ENCOUNTER — Encounter (HOSPITAL_COMMUNITY): Payer: Self-pay | Admitting: Vascular Surgery

## 2015-02-24 ENCOUNTER — Other Ambulatory Visit (HOSPITAL_BASED_OUTPATIENT_CLINIC_OR_DEPARTMENT_OTHER): Payer: Medicare Other

## 2015-02-24 ENCOUNTER — Ambulatory Visit (HOSPITAL_BASED_OUTPATIENT_CLINIC_OR_DEPARTMENT_OTHER): Payer: Medicare Other

## 2015-02-24 VITALS — BP 105/45 | HR 79 | Temp 99.2°F | Resp 18

## 2015-02-24 DIAGNOSIS — C9000 Multiple myeloma not having achieved remission: Secondary | ICD-10-CM

## 2015-02-24 DIAGNOSIS — Z5112 Encounter for antineoplastic immunotherapy: Secondary | ICD-10-CM

## 2015-02-24 LAB — CBC WITH DIFFERENTIAL/PLATELET
BASO%: 0.5 % (ref 0.0–2.0)
BASOS ABS: 0.1 10*3/uL (ref 0.0–0.1)
EOS ABS: 0.2 10*3/uL (ref 0.0–0.5)
EOS%: 1.1 % (ref 0.0–7.0)
HEMATOCRIT: 33 % — AB (ref 34.8–46.6)
HEMOGLOBIN: 10.6 g/dL — AB (ref 11.6–15.9)
LYMPH#: 0.7 10*3/uL — AB (ref 0.9–3.3)
LYMPH%: 5.4 % — ABNORMAL LOW (ref 14.0–49.7)
MCH: 29.9 pg (ref 25.1–34.0)
MCHC: 32 g/dL (ref 31.5–36.0)
MCV: 93.3 fL (ref 79.5–101.0)
MONO#: 0.5 10*3/uL (ref 0.1–0.9)
MONO%: 3.7 % (ref 0.0–14.0)
NEUT#: 11.9 10*3/uL — ABNORMAL HIGH (ref 1.5–6.5)
NEUT%: 89.3 % — AB (ref 38.4–76.8)
PLATELETS: 159 10*3/uL (ref 145–400)
RBC: 3.53 10*6/uL — ABNORMAL LOW (ref 3.70–5.45)
RDW: 19.9 % — AB (ref 11.2–14.5)
WBC: 13.4 10*3/uL — ABNORMAL HIGH (ref 3.9–10.3)

## 2015-02-24 LAB — COMPREHENSIVE METABOLIC PANEL (CC13)
ALBUMIN: 3.1 g/dL — AB (ref 3.5–5.0)
ALT: 21 U/L (ref 0–55)
ANION GAP: 15 meq/L — AB (ref 3–11)
AST: 25 U/L (ref 5–34)
Alkaline Phosphatase: 99 U/L (ref 40–150)
BUN: 34.8 mg/dL — AB (ref 7.0–26.0)
CALCIUM: 9.1 mg/dL (ref 8.4–10.4)
CHLORIDE: 97 meq/L — AB (ref 98–109)
CO2: 25 mEq/L (ref 22–29)
Creatinine: 6.9 mg/dL (ref 0.6–1.1)
EGFR: 6 mL/min/{1.73_m2} — ABNORMAL LOW (ref >90)
Glucose: 326 mg/dl — ABNORMAL HIGH (ref 70–140)
POTASSIUM: 4.5 meq/L (ref 3.5–5.1)
Sodium: 137 mEq/L (ref 136–145)
Total Bilirubin: 0.46 mg/dL (ref 0.20–1.20)
Total Protein: 7 g/dL (ref 6.4–8.3)

## 2015-02-24 MED ORDER — BORTEZOMIB CHEMO SQ INJECTION 3.5 MG (2.5MG/ML)
1.3000 mg/m2 | Freq: Once | INTRAMUSCULAR | Status: AC
  Administered 2015-02-24: 2.75 mg via SUBCUTANEOUS
  Filled 2015-02-24: qty 2.75

## 2015-02-24 MED ORDER — ONDANSETRON HCL 8 MG PO TABS
8.0000 mg | ORAL_TABLET | Freq: Once | ORAL | Status: AC
  Administered 2015-02-24: 8 mg via ORAL

## 2015-02-24 MED ORDER — ONDANSETRON HCL 8 MG PO TABS
ORAL_TABLET | ORAL | Status: AC
  Filled 2015-02-24: qty 1

## 2015-02-24 NOTE — Progress Notes (Signed)
Okay to treat with today's creatinine per Shauna Hugh, RN.  Upon assessment, pt reports small amounts of blood she has noticed in her sputum.  She states she thought it might be from intubation from surgery she had last Friday but wondered since it had almost been 1 week.  Pts hgb and plts stable and pt reports no other signs of bleeding.  Diane, RN called and note sent to [facility], MD for follow up.

## 2015-02-24 NOTE — Patient Instructions (Signed)
[facility] Discharge Instructions for Patients Receiving Chemotherapy  Today you received the following chemotherapy agents Velcade. To help prevent nausea and vomiting after your treatment, we encourage you to take your nausea medication as directed.  If you develop nausea and vomiting that is not controlled by your nausea medication, call the clinic.   BELOW ARE SYMPTOMS THAT SHOULD BE REPORTED IMMEDIATELY:  *FEVER GREATER THAN 100.5 F  *CHILLS WITH OR WITHOUT FEVER  NAUSEA AND VOMITING THAT IS NOT CONTROLLED WITH YOUR NAUSEA MEDICATION  *UNUSUAL SHORTNESS OF BREATH  *UNUSUAL BRUISING OR BLEEDING  TENDERNESS IN MOUTH AND THROAT WITH OR WITHOUT PRESENCE OF ULCERS  *URINARY PROBLEMS  *BOWEL PROBLEMS  UNUSUAL RASH Items with * indicate a potential emergency and should be followed up as soon as possible.  Feel free to call the clinic you have any questions or concerns. The clinic phone number is [phone].  Please show the CHEMO ALERT CARD at check-in to the Emergency Department and triage nurse.    

## 2015-03-03 ENCOUNTER — Other Ambulatory Visit (HOSPITAL_BASED_OUTPATIENT_CLINIC_OR_DEPARTMENT_OTHER): Payer: Medicare Other

## 2015-03-03 ENCOUNTER — Ambulatory Visit (HOSPITAL_BASED_OUTPATIENT_CLINIC_OR_DEPARTMENT_OTHER): Payer: Medicare Other

## 2015-03-03 VITALS — BP 107/47 | HR 71 | Temp 98.3°F | Resp 18

## 2015-03-03 DIAGNOSIS — Z5112 Encounter for antineoplastic immunotherapy: Secondary | ICD-10-CM

## 2015-03-03 DIAGNOSIS — C9000 Multiple myeloma not having achieved remission: Secondary | ICD-10-CM | POA: Diagnosis not present

## 2015-03-03 LAB — COMPREHENSIVE METABOLIC PANEL (CC13)
ALBUMIN: 3.2 g/dL — AB (ref 3.5–5.0)
ALK PHOS: 121 U/L (ref 40–150)
ALT: 34 U/L (ref 0–55)
AST: 32 U/L (ref 5–34)
Anion Gap: 13 mEq/L — ABNORMAL HIGH (ref 3–11)
BUN: 34.6 mg/dL — AB (ref 7.0–26.0)
CHLORIDE: 97 meq/L — AB (ref 98–109)
CO2: 27 mEq/L (ref 22–29)
Calcium: 8.9 mg/dL (ref 8.4–10.4)
EGFR: 6 mL/min/{1.73_m2} — ABNORMAL LOW (ref >90)
GLUCOSE: 196 mg/dL — AB (ref 70–140)
POTASSIUM: 3.8 meq/L (ref 3.5–5.1)
SODIUM: 137 meq/L (ref 136–145)
Total Bilirubin: 0.42 mg/dL (ref 0.20–1.20)
Total Protein: 7 g/dL (ref 6.4–8.3)

## 2015-03-03 LAB — CBC WITH DIFFERENTIAL/PLATELET
BASO%: 0.7 % (ref 0.0–2.0)
BASOS ABS: 0.1 10*3/uL (ref 0.0–0.1)
EOS%: 1.1 % (ref 0.0–7.0)
Eosinophils Absolute: 0.1 10*3/uL (ref 0.0–0.5)
HEMATOCRIT: 33.2 % — AB (ref 34.8–46.6)
HEMOGLOBIN: 10.7 g/dL — AB (ref 11.6–15.9)
LYMPH#: 1.2 10*3/uL (ref 0.9–3.3)
LYMPH%: 12.7 % — ABNORMAL LOW (ref 14.0–49.7)
MCH: 30 pg (ref 25.1–34.0)
MCHC: 32.2 g/dL (ref 31.5–36.0)
MCV: 93.1 fL (ref 79.5–101.0)
MONO#: 1 10*3/uL — ABNORMAL HIGH (ref 0.1–0.9)
MONO%: 10.6 % (ref 0.0–14.0)
NEUT%: 74.9 % (ref 38.4–76.8)
NEUTROS ABS: 7.4 10*3/uL — AB (ref 1.5–6.5)
Platelets: 163 10*3/uL (ref 145–400)
RBC: 3.56 10*6/uL — ABNORMAL LOW (ref 3.70–5.45)
RDW: 19.7 % — AB (ref 11.2–14.5)
WBC: 9.8 10*3/uL (ref 3.9–10.3)

## 2015-03-03 MED ORDER — ONDANSETRON HCL 8 MG PO TABS
ORAL_TABLET | ORAL | Status: AC
Start: 2015-03-03 — End: 2015-03-03
  Filled 2015-03-03: qty 1

## 2015-03-03 MED ORDER — ONDANSETRON HCL 8 MG PO TABS
8.0000 mg | ORAL_TABLET | Freq: Once | ORAL | Status: AC
  Administered 2015-03-03: 8 mg via ORAL

## 2015-03-03 MED ORDER — BORTEZOMIB CHEMO SQ INJECTION 3.5 MG (2.5MG/ML)
1.3000 mg/m2 | Freq: Once | INTRAMUSCULAR | Status: AC
  Administered 2015-03-03: 2.75 mg via SUBCUTANEOUS
  Filled 2015-03-03: qty 2.75

## 2015-03-03 NOTE — Patient Instructions (Signed)
[facility] Discharge Instructions for Patients Receiving Chemotherapy  Today you received the following chemotherapy agents:  Velcade  To help prevent nausea and vomiting after your treatment, we encourage you to take your nausea medication as prescribed.   If you develop nausea and vomiting that is not controlled by your nausea medication, call the clinic.   BELOW ARE SYMPTOMS THAT SHOULD BE REPORTED IMMEDIATELY:  *FEVER GREATER THAN 100.5 F  *CHILLS WITH OR WITHOUT FEVER  NAUSEA AND VOMITING THAT IS NOT CONTROLLED WITH YOUR NAUSEA MEDICATION  *UNUSUAL SHORTNESS OF BREATH  *UNUSUAL BRUISING OR BLEEDING  TENDERNESS IN MOUTH AND THROAT WITH OR WITHOUT PRESENCE OF ULCERS  *URINARY PROBLEMS  *BOWEL PROBLEMS  UNUSUAL RASH Items with * indicate a potential emergency and should be followed up as soon as possible.  Feel free to call the clinic you have any questions or concerns. The clinic phone number is [phone].  Please show the CHEMO ALERT CARD at check-in to the Emergency Department and triage nurse.   

## 2015-03-03 NOTE — Progress Notes (Signed)
Ok to treat with creatinine 6.4 per Dr. Julien Nordmann.

## 2015-03-10 ENCOUNTER — Telehealth: Payer: Self-pay | Admitting: Internal Medicine

## 2015-03-10 ENCOUNTER — Other Ambulatory Visit (HOSPITAL_BASED_OUTPATIENT_CLINIC_OR_DEPARTMENT_OTHER): Payer: Medicare Other

## 2015-03-10 ENCOUNTER — Encounter: Payer: Self-pay | Admitting: Internal Medicine

## 2015-03-10 ENCOUNTER — Other Ambulatory Visit: Payer: Self-pay | Admitting: Medical Oncology

## 2015-03-10 ENCOUNTER — Ambulatory Visit (HOSPITAL_BASED_OUTPATIENT_CLINIC_OR_DEPARTMENT_OTHER): Payer: Medicare Other | Admitting: Internal Medicine

## 2015-03-10 ENCOUNTER — Ambulatory Visit (HOSPITAL_BASED_OUTPATIENT_CLINIC_OR_DEPARTMENT_OTHER): Payer: Medicare Other

## 2015-03-10 VITALS — BP 98/51 | HR 79 | Temp 98.6°F | Resp 18 | Ht 65.0 in | Wt 205.3 lb

## 2015-03-10 DIAGNOSIS — Z992 Dependence on renal dialysis: Secondary | ICD-10-CM | POA: Diagnosis not present

## 2015-03-10 DIAGNOSIS — C9000 Multiple myeloma not having achieved remission: Secondary | ICD-10-CM | POA: Diagnosis not present

## 2015-03-10 DIAGNOSIS — N189 Chronic kidney disease, unspecified: Secondary | ICD-10-CM

## 2015-03-10 DIAGNOSIS — D631 Anemia in chronic kidney disease: Secondary | ICD-10-CM | POA: Diagnosis not present

## 2015-03-10 DIAGNOSIS — Z5112 Encounter for antineoplastic immunotherapy: Secondary | ICD-10-CM

## 2015-03-10 LAB — CBC WITH DIFFERENTIAL/PLATELET
BASO%: 0.1 % (ref 0.0–2.0)
Basophils Absolute: 0 10*3/uL (ref 0.0–0.1)
EOS ABS: 0.1 10*3/uL (ref 0.0–0.5)
EOS%: 1.2 % (ref 0.0–7.0)
HEMATOCRIT: 33.4 % — AB (ref 34.8–46.6)
HGB: 10.7 g/dL — ABNORMAL LOW (ref 11.6–15.9)
LYMPH#: 1.4 10*3/uL (ref 0.9–3.3)
LYMPH%: 15.6 % (ref 14.0–49.7)
MCH: 29.9 pg (ref 25.1–34.0)
MCHC: 32 g/dL (ref 31.5–36.0)
MCV: 93.3 fL (ref 79.5–101.0)
MONO#: 0.7 10*3/uL (ref 0.1–0.9)
MONO%: 7.4 % (ref 0.0–14.0)
NEUT%: 75.7 % (ref 38.4–76.8)
NEUTROS ABS: 6.8 10*3/uL — AB (ref 1.5–6.5)
PLATELETS: 158 10*3/uL (ref 145–400)
RBC: 3.58 10*6/uL — AB (ref 3.70–5.45)
RDW: 18.9 % — ABNORMAL HIGH (ref 11.2–14.5)
WBC: 9 10*3/uL (ref 3.9–10.3)

## 2015-03-10 LAB — COMPREHENSIVE METABOLIC PANEL (CC13)
ALBUMIN: 3.3 g/dL — AB (ref 3.5–5.0)
ALK PHOS: 112 U/L (ref 40–150)
ALT: 19 U/L (ref 0–55)
ANION GAP: 15 meq/L — AB (ref 3–11)
AST: 15 U/L (ref 5–34)
BILIRUBIN TOTAL: 0.51 mg/dL (ref 0.20–1.20)
BUN: 37.3 mg/dL — ABNORMAL HIGH (ref 7.0–26.0)
CALCIUM: 8.9 mg/dL (ref 8.4–10.4)
CO2: 25 meq/L (ref 22–29)
CREATININE: 6.6 mg/dL — AB (ref 0.6–1.1)
Chloride: 97 mEq/L — ABNORMAL LOW (ref 98–109)
EGFR: 6 mL/min/{1.73_m2} — AB (ref >90)
Glucose: 314 mg/dl — ABNORMAL HIGH (ref 70–140)
Potassium: 3.8 mEq/L (ref 3.5–5.1)
Sodium: 138 mEq/L (ref 136–145)
TOTAL PROTEIN: 7.1 g/dL (ref 6.4–8.3)

## 2015-03-10 MED ORDER — ONDANSETRON HCL 8 MG PO TABS
8.0000 mg | ORAL_TABLET | Freq: Once | ORAL | Status: AC
  Administered 2015-03-10: 8 mg via ORAL

## 2015-03-10 MED ORDER — BORTEZOMIB CHEMO SQ INJECTION 3.5 MG (2.5MG/ML)
1.3000 mg/m2 | Freq: Once | INTRAMUSCULAR | Status: AC
  Administered 2015-03-10: 2.75 mg via SUBCUTANEOUS
  Filled 2015-03-10: qty 2.75

## 2015-03-10 MED ORDER — ACYCLOVIR 400 MG PO TABS: ORAL_TABLET | ORAL | Status: DC

## 2015-03-10 MED ORDER — ONDANSETRON HCL 8 MG PO TABS
ORAL_TABLET | ORAL | Status: AC
  Filled 2015-03-10: qty 1

## 2015-03-10 NOTE — Patient Instructions (Signed)
[facility] Discharge Instructions for Patients Receiving Chemotherapy  Today you received the following chemotherapy agents:  Velcade  To help prevent nausea and vomiting after your treatment, we encourage you to take your nausea medication as prescribed.   If you develop nausea and vomiting that is not controlled by your nausea medication, call the clinic.   BELOW ARE SYMPTOMS THAT SHOULD BE REPORTED IMMEDIATELY:  *FEVER GREATER THAN 100.5 F  *CHILLS WITH OR WITHOUT FEVER  NAUSEA AND VOMITING THAT IS NOT CONTROLLED WITH YOUR NAUSEA MEDICATION  *UNUSUAL SHORTNESS OF BREATH  *UNUSUAL BRUISING OR BLEEDING  TENDERNESS IN MOUTH AND THROAT WITH OR WITHOUT PRESENCE OF ULCERS  *URINARY PROBLEMS  *BOWEL PROBLEMS  UNUSUAL RASH Items with * indicate a potential emergency and should be followed up as soon as possible.  Feel free to call the clinic you have any questions or concerns. The clinic phone number is [phone].  Please show the CHEMO ALERT CARD at check-in to the Emergency Department and triage nurse.   

## 2015-03-10 NOTE — Progress Notes (Signed)
[place] [phone]   [phone]  OFFICE PROGRESS NOTE  [name], MD [place] [address]  DIAGNOSIS:  #1 plasma cell dyscrasia, multiple myeloma.  #2 anemia of chronic disease secondary to chronic kidney disease.   PRIOR THERAPY: Treatment with single agent subcutaneous Velcade 1.3 mg/m2 on a weekly basis with Decadron 40 mg by mouth weekly, status post 48 cycles. Last dose was given on 07/27/2014.  CURRENT THERAPY: Observation.   INTERVAL HISTORY: [name] 81 y.o. female returns to the clinic today for followup visit accompanied by her granddaughter. The patient continues her hemodialysis on Monday, Wednesday and Friday. She is tolerating her current treatment with subcutaneous Velcade and Decadron fairly well except for fatigue. She was also noticed recently to have hypotension and she was seen by her primary care physician yesterday and discontinued Norvasc. She is also considered for iron infusion by her nephrologist. She denied having any fever or chills, no nausea or vomiting. The patient denied having any significant chest pain, shortness of breath, cough or hemoptysis. She has no significant weight loss or night sweats. She is here today to start cycle #49.  MEDICAL HISTORY: Past Medical History  Diagnosis Date  . Hypertension   . Asthma   . GERD (gastroesophageal reflux disease)   . Neuropathy in diabetes ([place] [facility])     Hx: of  . COPD (chronic obstructive pulmonary disease) ([facility])   . Anemia   . High cholesterol   . Pulmonary embolism ([place]) 1987  . History of blood transfusion 1987; 2011    "related to lung OR; HgB went down to 5"  . Arthritis     "hips, knees, shoulders" (11/10/2013)  . Chronic lower back pain   . Gout   . Multiple myeloma ([place])   . Neuropathy ([place])   . Hypothyroidism   . Type II diabetes mellitus ([place])   . ESRD (end stage renal disease) on dialysis [facility])     "1st  treatment 11/09/2013" MWF.  [address]  . Kidney stones   . Constipation     ALLERGIES:  is allergic to penicillins cross reactors.  MEDICATIONS:  Current Outpatient Prescriptions  Medication Sig Dispense Refill  . acyclovir (ZOVIRAX) 400 MG tablet TAKE 1 TABLET(400 MG) BY MOUTH DAILY 30 tablet 0  . albuterol (PROVENTIL HFA;VENTOLIN HFA) 108 (90 BASE) MCG/ACT inhaler Inhale 1 puff into the lungs 2 (two) times daily as needed for wheezing or shortness of breath.    . allopurinol (ZYLOPRIM) 100 MG tablet Take 100 mg by mouth daily with breakfast.     . B-D ULTRAFINE III SHORT PEN 31G X 8 MM MISC   4  . Cyanocobalamin (VITAMIN B-12 PO) Take 1 tablet by mouth daily.    Marland Kitchen dexamethasone (DECADRON) 4 MG tablet TAKE 10 TABLETS(40 MG) BY MOUTH 1 TIME A WEEK 40 tablet 0  . ergocalciferol (VITAMIN D2) 50000 UNITS capsule Take 50,000 Units by mouth once a week.    . gabapentin (NEURONTIN) 100 MG capsule Take 100 mg by mouth 3 (three) times daily.    Marland Kitchen glimepiride (AMARYL) 1 MG tablet Take 1 mg by mouth daily with breakfast.     . HYDROcodone-acetaminophen (NORCO) 10-325 MG per tablet TK 1 T PO  Q 6 H PRN P  0  . hydrOXYzine (ATARAX/VISTARIL) 25 MG tablet Take 25 mg by mouth every 12 (twelve) hours as needed for itching.   2  .  insulin aspart (NOVOLOG) 100 UNIT/ML injection Inject 5 Units into the skin daily as needed for high blood sugar (if blood sugar is over 200).    Ernest Mallick FLEXTOUCH 100 UNIT/ML Pen Inject 12 Units into the skin daily as needed (high blood sugar). If Blood sugar is above 110 pt will take  5  . lidocaine-prilocaine (EMLA) cream Apply 1 application topically as needed (for numbing).     . multivitamin (RENA-VIT) TABS tablet Take 1 tablet by mouth daily.    . ONE TOUCH ULTRA TEST test strip     . Oxycodone HCl 20 MG TABS Take 20 mg by mouth every 12 (twelve) hours.     . pantoprazole (PROTONIX) 40 MG tablet Take 40 mg by mouth 2 (two) times daily.     . saxagliptin HCl (ONGLYZA) 2.5  MG TABS tablet Take 2.5 mg by mouth daily with breakfast.     . tiZANidine (ZANAFLEX) 4 MG tablet Take 4 mg by mouth 2 (two) times daily as needed for muscle spasms.     Marland Kitchen levothyroxine (SYNTHROID, LEVOTHROID) 100 MCG tablet Take 100 mcg by mouth daily.  4  . ondansetron (ZOFRAN) 8 MG tablet Take 1 tablet (8 mg total) by mouth every 8 (eight) hours as needed for nausea or vomiting. (Patient not taking: Reported on 03/10/2015) 20 tablet 1  . oxyCODONE-acetaminophen (ROXICET) 5-325 MG per tablet Take 1-2 tablets by mouth every 4 (four) hours as needed for severe pain. (Patient not taking: Reported on 03/10/2015) 30 tablet 0   No current facility-administered medications for this visit.    SURGICAL HISTORY:  Past Surgical History  Procedure Laterality Date  . Lung removal, partial Left 1987    "blood clot"  . Cholecystectomy    . Av fistula placement Left 2011    "lower arm; never used"  . Colonoscopy    . Abdominoplasty  ~ 1983    "tummy tuck"  . Abdominal hysterectomy  1964  . Av fistula placement Left 05/15/2013    Procedure: INSERTION OF ARTERIOVENOUS (AV) GORE-TEX GRAFT ARM-LEFT UPPER ARM;  Surgeon: Mal Misty, MD;  Location: [facility] [facility];  Service: Vascular;  Laterality: Left;  . Appendectomy  1954  . Umbilical hernia repair  ~ 1983  . Tubal ligation  1964  . Cystoscopy w/ stone manipulation  1980's  . Hernia repair      umbicical hernia  . Eye surgery Bilateral 2016    [facility]-   . Av fistula placement Right 10/19/2014    Procedure: INSERTION OF ARTERIOVENOUS (AV) 4-56m x 45cm GORE-TEX GRAFT RIGHT FOREARM;  Surgeon: CAngelia Mould MD;  [facility]  Service: Vascular;  Laterality: Right;  . Av fistula placement Right 02/15/2015    Procedure: INSERTION OF RIGHT ARTERIOVENOUS (AV) GORE-TEX GRAFT ARM;  Surgeon: CAngelia Mould MD;  [facility];  Service: Vascular;  Laterality: Right;  . Thrombectomy w/ embolectomy Right 02/18/2015    Procedure: THROMBECTOMY RIGHT  UPPER ARM ARTERIOVENOUS GORE-TEX GRAFT with revision of arterial anastamosis;  Surgeon: JMal Misty MD;  [facility];  Service: Vascular;  Laterality: Right;    REVIEW OF SYSTEMS:  A comprehensive review of systems was negative except for: Constitutional: positive for fatigue   PHYSICAL EXAMINATION: General appearance: alert, cooperative, fatigued and no distress Head: Normocephalic, without obvious abnormality, atraumatic Neck: no adenopathy Lymph nodes: Cervical, supraclavicular, and axillary nodes normal. Resp: clear to auscultation bilaterally Back: symmetric, no curvature. ROM normal. No CVA tenderness. Cardio: regular  rate and rhythm, S1, S2 normal, no murmur, click, rub or gallop GI: soft, non-tender; bowel sounds normal; no masses,  no organomegaly Extremities: extremities normal, atraumatic, no cyanosis or edema Neurologic: Alert and oriented X 3, normal strength and tone. Normal symmetric reflexes. Normal coordination and gait  ECOG PERFORMANCE STATUS: 2 - Symptomatic, <50% confined to bed  Blood pressure 98/51, pulse 79, temperature 98.6 F (37 C), temperature source Oral, resp. rate 18, height _0  (1.651 m), weight 205 lb 4.8 oz (93.123 kg), SpO2 90 %.  LABORATORY DATA: Lab Results  Component Value Date   WBC 9.0 03/10/2015   HGB 10.7* 03/10/2015   HCT 33.4* 03/10/2015   MCV 93.3 03/10/2015   PLT 158 03/10/2015      Chemistry      Component Value Date/Time   NA 137 03/03/2015 0818   NA 135 02/18/2015 0625   K 3.8 03/03/2015 0818   K 4.8 02/18/2015 0625   CL 95* 01/23/2015 1200   CL 98 09/17/2012 1242   CO2 27 03/03/2015 0818   CO2 24 01/23/2015 1200   BUN 34.6* 03/03/2015 0818   BUN 68* 01/23/2015 1200   CREATININE 6.4 Repeated and Verified* 03/03/2015 0818   CREATININE 9.86* 01/23/2015 1200      Component Value Date/Time   CALCIUM 8.9 03/03/2015 0818   CALCIUM 8.3* 01/23/2015 1200   CALCIUM 8.6 12/02/2012 0933   ALKPHOS 121 03/03/2015 0818    ALKPHOS 98 01/23/2015 1200   AST 32 03/03/2015 0818   AST 27 01/23/2015 1200   ALT 34 03/03/2015 0818   ALT 17 01/23/2015 1200   BILITOT 0.42 03/03/2015 0818   BILITOT 0.5 01/23/2015 1200     Other lab results:   ASSESSMENT: This is a very pleasant 81 years old [place] American female with plasma cell dyscrasia completed treatment with weekly subcutaneous Velcade and Decadron status post 48 weekly doses and tolerating it fairly well  I recommended for the patient to continue her weekly treatment with subcutaneous Velcade and Decadron as scheduled. She will start cycle #49 today. She will come back for follow-up visit in 4 weeks after repeating myeloma panel for reevaluation of her disease. For the dysphagia disease, the patient will continue hemodialysis by nephrology. She was advised to call immediately if she has any concerning symptoms in the interval.  Disclaimer: This note was dictated with voice recognition software. Similar sounding words can inadvertently be transcribed and may not be corrected upon review.

## 2015-03-10 NOTE — Telephone Encounter (Signed)
GAVE AND PRINTED APPT SCHED AND AVS FOR PT FOR oct AND NOV

## 2015-03-10 NOTE — Progress Notes (Signed)
Per Mohamed it is okay to treat pt today with chemo and today's labs.  

## 2015-03-17 ENCOUNTER — Ambulatory Visit (HOSPITAL_BASED_OUTPATIENT_CLINIC_OR_DEPARTMENT_OTHER): Payer: Medicare Other

## 2015-03-17 ENCOUNTER — Other Ambulatory Visit (HOSPITAL_BASED_OUTPATIENT_CLINIC_OR_DEPARTMENT_OTHER): Payer: Medicare Other

## 2015-03-17 VITALS — BP 131/69 | HR 64 | Temp 97.8°F | Resp 18

## 2015-03-17 DIAGNOSIS — C9000 Multiple myeloma not having achieved remission: Secondary | ICD-10-CM

## 2015-03-17 DIAGNOSIS — Z5112 Encounter for antineoplastic immunotherapy: Secondary | ICD-10-CM

## 2015-03-17 LAB — CBC WITH DIFFERENTIAL/PLATELET
BASO%: 0.2 % (ref 0.0–2.0)
BASOS ABS: 0 10*3/uL (ref 0.0–0.1)
EOS%: 0.8 % (ref 0.0–7.0)
Eosinophils Absolute: 0.1 10*3/uL (ref 0.0–0.5)
HEMATOCRIT: 32.5 % — AB (ref 34.8–46.6)
HEMOGLOBIN: 10.6 g/dL — AB (ref 11.6–15.9)
LYMPH#: 1.4 10*3/uL (ref 0.9–3.3)
LYMPH%: 15.3 % (ref 14.0–49.7)
MCH: 30.4 pg (ref 25.1–34.0)
MCHC: 32.6 g/dL (ref 31.5–36.0)
MCV: 93.1 fL (ref 79.5–101.0)
MONO#: 0.5 10*3/uL (ref 0.1–0.9)
MONO%: 5.6 % (ref 0.0–14.0)
NEUT#: 7.2 10*3/uL — ABNORMAL HIGH (ref 1.5–6.5)
NEUT%: 78.1 % — AB (ref 38.4–76.8)
PLATELETS: 190 10*3/uL (ref 145–400)
RBC: 3.49 10*6/uL — ABNORMAL LOW (ref 3.70–5.45)
RDW: 18.3 % — AB (ref 11.2–14.5)
WBC: 9.2 10*3/uL (ref 3.9–10.3)

## 2015-03-17 LAB — COMPREHENSIVE METABOLIC PANEL (CC13)
ALBUMIN: 3.2 g/dL — AB (ref 3.5–5.0)
ALK PHOS: 111 U/L (ref 40–150)
ALT: 22 U/L (ref 0–55)
ANION GAP: 15 meq/L — AB (ref 3–11)
AST: 26 U/L (ref 5–34)
BUN: 33.9 mg/dL — AB (ref 7.0–26.0)
CALCIUM: 9.4 mg/dL (ref 8.4–10.4)
CHLORIDE: 95 meq/L — AB (ref 98–109)
CO2: 25 mEq/L (ref 22–29)
CREATININE: 6.8 mg/dL — AB (ref 0.6–1.1)
EGFR: 6 mL/min/{1.73_m2} — ABNORMAL LOW (ref >90)
Glucose: 292 mg/dl — ABNORMAL HIGH (ref 70–140)
POTASSIUM: 4.3 meq/L (ref 3.5–5.1)
Sodium: 135 mEq/L — ABNORMAL LOW (ref 136–145)
Total Bilirubin: 0.55 mg/dL (ref 0.20–1.20)
Total Protein: 7.1 g/dL (ref 6.4–8.3)

## 2015-03-17 MED ORDER — ONDANSETRON HCL 8 MG PO TABS
ORAL_TABLET | ORAL | Status: AC
Start: 2015-03-17 — End: 2015-03-17
  Filled 2015-03-17: qty 1

## 2015-03-17 MED ORDER — ONDANSETRON HCL 8 MG PO TABS
8.0000 mg | ORAL_TABLET | Freq: Once | ORAL | Status: AC
  Administered 2015-03-17: 8 mg via ORAL

## 2015-03-17 MED ORDER — BORTEZOMIB CHEMO SQ INJECTION 3.5 MG (2.5MG/ML)
1.3000 mg/m2 | Freq: Once | INTRAMUSCULAR | Status: AC
  Administered 2015-03-17: 2.75 mg via SUBCUTANEOUS
  Filled 2015-03-17: qty 2.75

## 2015-03-17 NOTE — Patient Instructions (Signed)
[facility] Discharge Instructions for Patients Receiving Chemotherapy  Today you received the following chemotherapy agents Velcade SQ To help prevent nausea and vomiting after your treatment, we encourage you to take your nausea medication Zofran 8 mg every 8 hours as needed  If you develop nausea and vomiting that is not controlled by your nausea medication, call the clinic.   BELOW ARE SYMPTOMS THAT SHOULD BE REPORTED IMMEDIATELY:  *FEVER GREATER THAN 100.5 F  *CHILLS WITH OR WITHOUT FEVER  NAUSEA AND VOMITING THAT IS NOT CONTROLLED WITH YOUR NAUSEA MEDICATION  *UNUSUAL SHORTNESS OF BREATH  *UNUSUAL BRUISING OR BLEEDING  TENDERNESS IN MOUTH AND THROAT WITH OR WITHOUT PRESENCE OF ULCERS  *URINARY PROBLEMS  *BOWEL PROBLEMS  UNUSUAL RASH Items with * indicate a potential emergency and should be followed up as soon as possible.  Feel free to call the clinic you have any questions or concerns. The clinic phone number is [phone].  Please show the [place] at check-in to the Emergency Department and triage nurse.

## 2015-03-17 NOTE — Progress Notes (Signed)
Vital Signs and lab results reported to Dr. Julien Nordmann; (pt had dialysis yesterday) okay to proceed with treatment and pt instructed to call PCP and report vital sign.  Pt verbalized understanding of instructions.

## 2015-03-24 ENCOUNTER — Ambulatory Visit (HOSPITAL_BASED_OUTPATIENT_CLINIC_OR_DEPARTMENT_OTHER): Payer: Medicare Other

## 2015-03-24 ENCOUNTER — Encounter: Payer: Self-pay | Admitting: Vascular Surgery

## 2015-03-24 ENCOUNTER — Other Ambulatory Visit (HOSPITAL_BASED_OUTPATIENT_CLINIC_OR_DEPARTMENT_OTHER): Payer: Medicare Other

## 2015-03-24 ENCOUNTER — Ambulatory Visit (INDEPENDENT_AMBULATORY_CARE_PROVIDER_SITE_OTHER): Payer: Medicare Other | Admitting: Vascular Surgery

## 2015-03-24 VITALS — BP 101/49 | HR 69 | Temp 98.3°F | Resp 20

## 2015-03-24 VITALS — BP 97/48 | HR 70 | Temp 98.2°F | Resp 14 | Ht 65.0 in | Wt 206.0 lb

## 2015-03-24 DIAGNOSIS — Z5112 Encounter for antineoplastic immunotherapy: Secondary | ICD-10-CM

## 2015-03-24 DIAGNOSIS — C9000 Multiple myeloma not having achieved remission: Secondary | ICD-10-CM

## 2015-03-24 DIAGNOSIS — T82590S Other mechanical complication of surgically created arteriovenous fistula, sequela: Secondary | ICD-10-CM

## 2015-03-24 DIAGNOSIS — T82510S Breakdown (mechanical) of surgically created arteriovenous fistula, sequela: Secondary | ICD-10-CM

## 2015-03-24 LAB — COMPREHENSIVE METABOLIC PANEL (CC13)
ANION GAP: 13 meq/L — AB (ref 3–11)
AST: 15 U/L (ref 5–34)
Albumin: 3.1 g/dL — ABNORMAL LOW (ref 3.5–5.0)
Alkaline Phosphatase: 110 U/L (ref 40–150)
BILIRUBIN TOTAL: 0.31 mg/dL (ref 0.20–1.20)
BUN: 37.5 mg/dL — ABNORMAL HIGH (ref 7.0–26.0)
CALCIUM: 9.2 mg/dL (ref 8.4–10.4)
CO2: 25 mEq/L (ref 22–29)
CREATININE: 7.5 mg/dL — AB (ref 0.6–1.1)
Chloride: 99 mEq/L (ref 98–109)
EGFR: 5 mL/min/{1.73_m2} — AB (ref >90)
Glucose: 298 mg/dl — ABNORMAL HIGH (ref 70–140)
Potassium: 4.1 mEq/L (ref 3.5–5.1)
Sodium: 137 mEq/L (ref 136–145)
TOTAL PROTEIN: 7 g/dL (ref 6.4–8.3)

## 2015-03-24 LAB — CBC WITH DIFFERENTIAL/PLATELET
BASO%: 0.7 % (ref 0.0–2.0)
Basophils Absolute: 0.1 10*3/uL (ref 0.0–0.1)
EOS ABS: 0.1 10*3/uL (ref 0.0–0.5)
EOS%: 1.2 % (ref 0.0–7.0)
HCT: 33.1 % — ABNORMAL LOW (ref 34.8–46.6)
HGB: 10.5 g/dL — ABNORMAL LOW (ref 11.6–15.9)
LYMPH%: 9 % — AB (ref 14.0–49.7)
MCH: 30.5 pg (ref 25.1–34.0)
MCHC: 31.8 g/dL (ref 31.5–36.0)
MCV: 95.8 fL (ref 79.5–101.0)
MONO#: 0.9 10*3/uL (ref 0.1–0.9)
MONO%: 7.5 % (ref 0.0–14.0)
NEUT#: 10.2 10*3/uL — ABNORMAL HIGH (ref 1.5–6.5)
NEUT%: 81.6 % — AB (ref 38.4–76.8)
PLATELETS: 158 10*3/uL (ref 145–400)
RBC: 3.45 10*6/uL — AB (ref 3.70–5.45)
RDW: 20.9 % — ABNORMAL HIGH (ref 11.2–14.5)
WBC: 12.5 10*3/uL — ABNORMAL HIGH (ref 3.9–10.3)
lymph#: 1.1 10*3/uL (ref 0.9–3.3)

## 2015-03-24 MED ORDER — ONDANSETRON HCL 8 MG PO TABS
8.0000 mg | ORAL_TABLET | Freq: Once | ORAL | Status: AC
  Administered 2015-03-24: 8 mg via ORAL

## 2015-03-24 MED ORDER — BORTEZOMIB CHEMO SQ INJECTION 3.5 MG (2.5MG/ML)
1.3000 mg/m2 | Freq: Once | INTRAMUSCULAR | Status: AC
  Administered 2015-03-24: 2.75 mg via SUBCUTANEOUS
  Filled 2015-03-24: qty 2.75

## 2015-03-24 MED ORDER — ONDANSETRON HCL 8 MG PO TABS
ORAL_TABLET | ORAL | Status: AC
  Filled 2015-03-24: qty 1

## 2015-03-24 NOTE — Progress Notes (Signed)
Ok to treat with creatinine 7.5 per Dr. Julien Nordmann.

## 2015-03-24 NOTE — Patient Instructions (Signed)
[facility] Discharge Instructions for Patients Receiving Chemotherapy  Today you received the following chemotherapy agents:  Velcade  To help prevent nausea and vomiting after your treatment, we encourage you to take your nausea medication as prescribed.   If you develop nausea and vomiting that is not controlled by your nausea medication, call the clinic.   BELOW ARE SYMPTOMS THAT SHOULD BE REPORTED IMMEDIATELY:  *FEVER GREATER THAN 100.5 F  *CHILLS WITH OR WITHOUT FEVER  NAUSEA AND VOMITING THAT IS NOT CONTROLLED WITH YOUR NAUSEA MEDICATION  *UNUSUAL SHORTNESS OF BREATH  *UNUSUAL BRUISING OR BLEEDING  TENDERNESS IN MOUTH AND THROAT WITH OR WITHOUT PRESENCE OF ULCERS  *URINARY PROBLEMS  *BOWEL PROBLEMS  UNUSUAL RASH Items with * indicate a potential emergency and should be followed up as soon as possible.  Feel free to call the clinic you have any questions or concerns. The clinic phone number is [phone].  Please show the CHEMO ALERT CARD at check-in to the Emergency Department and triage nurse.   

## 2015-03-24 NOTE — Progress Notes (Signed)
Patient is an 81 year old female sent by Dr. Mercy Moore for evaluation of a nonhealing wound right upper arm. The patient had a right upper arm AV graft placed by Dr. Scot Dock on 02/15/2015. This was an upper arm loop graft. This subsequently thrombosed and a thrombectomy and revision was performed by Dr. Kellie Simmering on 02/18/2015. The patient is currently not using the graft she states her arm is sore. She is currently dialyzing via left side catheter. She denies any fever or chills. She states she did get some antibiotics with dialysis. She denies any significant drainage from the wound. She also complains of some numbness and tingling on the inner aspect of her arm and on the ulnar aspect of her right forearm. She states that this limits her somewhat on doing housework. She does not really complain of much pain in the right hand.  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 The patient will follow-up in a few weeks to make sure that the wound continues to heal. I did discuss the patient today. There is some risk of infection with the open wound that hopefully this will continue to heal with local wound care. I also discussed with the patient that I think it's okay for her to go ahead and use the graft for dialysis.  Ruta Hinds, MD [facility] Office: [phone] Pager:  [phone]

## 2015-03-25 ENCOUNTER — Other Ambulatory Visit: Payer: Self-pay | Admitting: *Deleted

## 2015-03-25 DIAGNOSIS — C9000 Multiple myeloma not having achieved remission: Secondary | ICD-10-CM

## 2015-03-25 MED ORDER — ACYCLOVIR 400 MG PO TABS: ORAL_TABLET | ORAL | Status: AC

## 2015-03-25 MED ORDER — DEXAMETHASONE 4 MG PO TABS: ORAL_TABLET | ORAL | Status: AC

## 2015-03-31 ENCOUNTER — Other Ambulatory Visit (HOSPITAL_BASED_OUTPATIENT_CLINIC_OR_DEPARTMENT_OTHER): Payer: Medicare Other

## 2015-03-31 ENCOUNTER — Ambulatory Visit (HOSPITAL_BASED_OUTPATIENT_CLINIC_OR_DEPARTMENT_OTHER): Payer: Medicare Other

## 2015-03-31 VITALS — BP 95/62 | HR 65 | Temp 98.2°F | Resp 20

## 2015-03-31 DIAGNOSIS — C9000 Multiple myeloma not having achieved remission: Secondary | ICD-10-CM

## 2015-03-31 DIAGNOSIS — Z5112 Encounter for antineoplastic immunotherapy: Secondary | ICD-10-CM

## 2015-03-31 LAB — COMPREHENSIVE METABOLIC PANEL (CC13)
ALBUMIN: 2.9 g/dL — AB (ref 3.5–5.0)
ALK PHOS: 115 U/L (ref 40–150)
ALT: <9 U/L (ref 0–55)
AST: 25 U/L (ref 5–34)
Anion Gap: 14 mEq/L — ABNORMAL HIGH (ref 3–11)
BILIRUBIN TOTAL: 0.3 mg/dL (ref 0.20–1.20)
BUN: 37.8 mg/dL — AB (ref 7.0–26.0)
CALCIUM: 8.5 mg/dL (ref 8.4–10.4)
CO2: 26 mEq/L (ref 22–29)
Chloride: 100 mEq/L (ref 98–109)
Creatinine: 7.5 mg/dL (ref 0.6–1.1)
EGFR: 5 mL/min/{1.73_m2} — AB (ref >90)
GLUCOSE: 272 mg/dL — AB (ref 70–140)
Potassium: 4.1 mEq/L (ref 3.5–5.1)
SODIUM: 140 meq/L (ref 136–145)
TOTAL PROTEIN: 6.7 g/dL (ref 6.4–8.3)

## 2015-03-31 LAB — CBC WITH DIFFERENTIAL/PLATELET
BASO%: 1.2 % (ref 0.0–2.0)
BASOS ABS: 0.1 10*3/uL (ref 0.0–0.1)
EOS%: 1.2 % (ref 0.0–7.0)
Eosinophils Absolute: 0.1 10*3/uL (ref 0.0–0.5)
HEMATOCRIT: 31.7 % — AB (ref 34.8–46.6)
HEMOGLOBIN: 10.2 g/dL — AB (ref 11.6–15.9)
LYMPH%: 9.7 % — ABNORMAL LOW (ref 14.0–49.7)
MCH: 31.3 pg (ref 25.1–34.0)
MCHC: 32.1 g/dL (ref 31.5–36.0)
MCV: 97.4 fL (ref 79.5–101.0)
MONO#: 0.8 10*3/uL (ref 0.1–0.9)
MONO%: 9.3 % (ref 0.0–14.0)
NEUT%: 78.6 % — ABNORMAL HIGH (ref 38.4–76.8)
NEUTROS ABS: 6.3 10*3/uL (ref 1.5–6.5)
PLATELETS: 140 10*3/uL — AB (ref 145–400)
RBC: 3.26 10*6/uL — ABNORMAL LOW (ref 3.70–5.45)
RDW: 22.3 % — ABNORMAL HIGH (ref 11.2–14.5)
WBC: 8.1 10*3/uL (ref 3.9–10.3)
lymph#: 0.8 10*3/uL — ABNORMAL LOW (ref 0.9–3.3)

## 2015-03-31 LAB — LACTATE DEHYDROGENASE (CC13): LDH: 246 U/L — AB (ref 125–245)

## 2015-03-31 MED ORDER — ONDANSETRON HCL 8 MG PO TABS
ORAL_TABLET | ORAL | Status: AC
  Filled 2015-03-31: qty 1

## 2015-03-31 MED ORDER — ONDANSETRON HCL 8 MG PO TABS
8.0000 mg | ORAL_TABLET | Freq: Once | ORAL | Status: AC
  Administered 2015-03-31: 8 mg via ORAL

## 2015-03-31 MED ORDER — BORTEZOMIB CHEMO SQ INJECTION 3.5 MG (2.5MG/ML)
1.3000 mg/m2 | Freq: Once | INTRAMUSCULAR | Status: AC
  Administered 2015-03-31: 2.75 mg via SUBCUTANEOUS
  Filled 2015-03-31: qty 2.75

## 2015-03-31 NOTE — Patient Instructions (Signed)
[facility] Discharge Instructions for Patients Receiving Chemotherapy  Today you received the following chemotherapy agents:  Velcade  To help prevent nausea and vomiting after your treatment, we encourage you to take your nausea medication as prescribed.   If you develop nausea and vomiting that is not controlled by your nausea medication, call the clinic.   BELOW ARE SYMPTOMS THAT SHOULD BE REPORTED IMMEDIATELY:  *FEVER GREATER THAN 100.5 F  *CHILLS WITH OR WITHOUT FEVER  NAUSEA AND VOMITING THAT IS NOT CONTROLLED WITH YOUR NAUSEA MEDICATION  *UNUSUAL SHORTNESS OF BREATH  *UNUSUAL BRUISING OR BLEEDING  TENDERNESS IN MOUTH AND THROAT WITH OR WITHOUT PRESENCE OF ULCERS  *URINARY PROBLEMS  *BOWEL PROBLEMS  UNUSUAL RASH Items with * indicate a potential emergency and should be followed up as soon as possible.  Feel free to call the clinic you have any questions or concerns. The clinic phone number is [phone].  Please show the CHEMO ALERT CARD at check-in to the Emergency Department and triage nurse.   

## 2015-03-31 NOTE — Progress Notes (Signed)
OK to treat with today's  Lab per Dr. Lindi Adie

## 2015-04-04 LAB — KAPPA/LAMBDA LIGHT CHAINS
KAPPA LAMBDA RATIO: 1.15 (ref 0.26–1.65)
Kappa free light chain: 11.7 mg/dL — ABNORMAL HIGH (ref 0.33–1.94)
LAMBDA FREE LGHT CHN: 10.2 mg/dL — AB (ref 0.57–2.63)

## 2015-04-04 LAB — BETA 2 MICROGLOBULIN, SERUM: Beta-2 Microglobulin: 28.3 mg/L — ABNORMAL HIGH (ref <=2.51)

## 2015-04-04 LAB — IGG, IGA, IGM
IGA: 139 mg/dL (ref 69–380)
IGM, SERUM: 82 mg/dL (ref 52–322)
IgG (Immunoglobin G), Serum: 895 mg/dL (ref 690–1700)

## 2015-04-07 ENCOUNTER — Encounter: Payer: Self-pay | Admitting: Oncology

## 2015-04-07 ENCOUNTER — Telehealth: Payer: Self-pay | Admitting: Internal Medicine

## 2015-04-07 ENCOUNTER — Ambulatory Visit: Payer: Medicare Other

## 2015-04-07 ENCOUNTER — Other Ambulatory Visit (HOSPITAL_BASED_OUTPATIENT_CLINIC_OR_DEPARTMENT_OTHER): Payer: Medicare Other

## 2015-04-07 ENCOUNTER — Ambulatory Visit (HOSPITAL_BASED_OUTPATIENT_CLINIC_OR_DEPARTMENT_OTHER): Payer: Medicare Other | Admitting: Oncology

## 2015-04-07 VITALS — BP 76/47 | HR 66 | Temp 98.6°F | Resp 17 | Ht 65.0 in | Wt 209.4 lb

## 2015-04-07 DIAGNOSIS — D631 Anemia in chronic kidney disease: Secondary | ICD-10-CM | POA: Diagnosis not present

## 2015-04-07 DIAGNOSIS — C9000 Multiple myeloma not having achieved remission: Secondary | ICD-10-CM

## 2015-04-07 DIAGNOSIS — Z992 Dependence on renal dialysis: Secondary | ICD-10-CM

## 2015-04-07 DIAGNOSIS — N189 Chronic kidney disease, unspecified: Secondary | ICD-10-CM | POA: Diagnosis not present

## 2015-04-07 DIAGNOSIS — I959 Hypotension, unspecified: Secondary | ICD-10-CM

## 2015-04-07 LAB — CBC WITH DIFFERENTIAL/PLATELET
BASO%: 0.9 % (ref 0.0–2.0)
Basophils Absolute: 0.1 10*3/uL (ref 0.0–0.1)
EOS ABS: 0.1 10*3/uL (ref 0.0–0.5)
EOS%: 2 % (ref 0.0–7.0)
HCT: 32.9 % — ABNORMAL LOW (ref 34.8–46.6)
HEMOGLOBIN: 10.5 g/dL — AB (ref 11.6–15.9)
LYMPH%: 14.9 % (ref 14.0–49.7)
MCH: 31.5 pg (ref 25.1–34.0)
MCHC: 32 g/dL (ref 31.5–36.0)
MCV: 98.5 fL (ref 79.5–101.0)
MONO#: 0.7 10*3/uL (ref 0.1–0.9)
MONO%: 10.2 % (ref 0.0–14.0)
NEUT#: 5.2 10*3/uL (ref 1.5–6.5)
NEUT%: 72 % (ref 38.4–76.8)
PLATELETS: 157 10*3/uL (ref 145–400)
RBC: 3.34 10*6/uL — AB (ref 3.70–5.45)
RDW: 22 % — ABNORMAL HIGH (ref 11.2–14.5)
WBC: 7.2 10*3/uL (ref 3.9–10.3)
lymph#: 1.1 10*3/uL (ref 0.9–3.3)

## 2015-04-07 LAB — COMPREHENSIVE METABOLIC PANEL (CC13)
ALBUMIN: 3.2 g/dL — AB (ref 3.5–5.0)
ALK PHOS: 110 U/L (ref 40–150)
ALT: <9 U/L (ref 0–55)
ANION GAP: 15 meq/L — AB (ref 3–11)
AST: 16 U/L (ref 5–34)
BUN: 33.5 mg/dL — AB (ref 7.0–26.0)
CHLORIDE: 97 meq/L — AB (ref 98–109)
CO2: 26 mEq/L (ref 22–29)
Calcium: 9.2 mg/dL (ref 8.4–10.4)
Creatinine: 7.3 mg/dL (ref 0.6–1.1)
EGFR: 6 mL/min/{1.73_m2} — AB (ref >90)
Glucose: 182 mg/dl — ABNORMAL HIGH (ref 70–140)
POTASSIUM: 4 meq/L (ref 3.5–5.1)
Sodium: 138 mEq/L (ref 136–145)
TOTAL PROTEIN: 6.8 g/dL (ref 6.4–8.3)
Total Bilirubin: 0.56 mg/dL (ref 0.20–1.20)

## 2015-04-07 NOTE — Progress Notes (Signed)
[place] [phone]   [phone]  OFFICE PROGRESS NOTE  [name], MD [place] [address]  DIAGNOSIS:  #1 plasma cell dyscrasia, multiple myeloma.  #2 anemia of chronic disease secondary to chronic kidney disease.   CURRENT THERAPY: Treatment with single agent subcutaneous Velcade 1.3 mg/m2 on a weekly basis with Decadron 40 mg by mouth weekly, status post 52 cycles.  INTERVAL HISTORY: [name] 81 y.o. female returns to the clinic today for followup visit accompanied by her granddaughter. The patient continues her hemodialysis on Monday, Wednesday and Friday. She is tolerating her current treatment with subcutaneous Velcade and Decadron fairly well except for fatigue. She continues to have issues with hypotension. Indicates that dialysis was paused on several occasions yesterday due to hypotension. She is not taking any medication for her blood pressure. She denied having any fever or chills, no nausea or vomiting. The patient denied having any significant chest pain, shortness of breath, cough or hemoptysis. She has no significant weight loss or night sweats. She is here today to start cycle #53.  MEDICAL HISTORY: Past Medical History  Diagnosis Date  . Hypertension   . Asthma   . GERD (gastroesophageal reflux disease)   . Neuropathy in diabetes ([place])     Hx: of  . COPD (chronic obstructive pulmonary disease) ([place])   . Anemia   . High cholesterol   . Pulmonary embolism ([place]) 1987  . History of blood transfusion 1987; 2011    "related to lung OR; HgB went down to 5"  . Arthritis     "hips, knees, shoulders" (11/10/2013)  . Chronic lower back pain   . Gout   . Multiple myeloma ([place])   . Neuropathy ([place])   . Hypothyroidism   . Type II diabetes mellitus ([place])   . ESRD (end stage renal disease) on dialysis [facility])     "1st treatment 11/09/2013" MWF.  [address]  . Kidney stones   . Constipation       ALLERGIES:  is allergic to penicillins cross reactors.  MEDICATIONS:  Current Outpatient Prescriptions  Medication Sig Dispense Refill  . acyclovir (ZOVIRAX) 400 MG tablet TAKE 1 TABLET(400 MG) BY MOUTH DAILY 30 tablet 0  . albuterol (PROVENTIL HFA;VENTOLIN HFA) 108 (90 BASE) MCG/ACT inhaler Inhale 1 puff into the lungs 2 (two) times daily as needed for wheezing or shortness of breath.    . allopurinol (ZYLOPRIM) 100 MG tablet Take 100 mg by mouth daily with breakfast.     . B-D ULTRAFINE III SHORT PEN 31G X 8 MM MISC   4  . dexamethasone (DECADRON) 4 MG tablet TAKE 10 TABLETS(40 MG) BY MOUTH 1 TIME A WEEK 40 tablet 0  . ergocalciferol (VITAMIN D2) 50000 UNITS capsule Take 50,000 Units by mouth once a week.    . gabapentin (NEURONTIN) 100 MG capsule Take 100 mg by mouth 3 (three) times daily.    Marland Kitchen glimepiride (AMARYL) 1 MG tablet Take 1 mg by mouth daily with breakfast.     . HYDROcodone-acetaminophen (NORCO) 10-325 MG per tablet TK 1 T PO  Q 6 H PRN P  0  . hydrOXYzine (ATARAX/VISTARIL) 25 MG tablet Take 25 mg by mouth every 12 (twelve) hours as needed for itching.   2  . insulin aspart (NOVOLOG) 100 UNIT/ML injection Inject 5 Units into the skin daily as needed for high blood sugar (if blood sugar is over 200).    Marland Kitchen  LEVEMIR FLEXTOUCH 100 UNIT/ML Pen Inject 12 Units into the skin daily as needed (high blood sugar). If Blood sugar is above 110 pt will take  5  . levothyroxine (SYNTHROID, LEVOTHROID) 100 MCG tablet Take 100 mcg by mouth daily.  4  . lidocaine-prilocaine (EMLA) cream Apply 1 application topically as needed (for numbing).     . multivitamin (RENA-VIT) TABS tablet Take 1 tablet by mouth daily.    . ONE TOUCH ULTRA TEST test strip     . Oxycodone HCl 20 MG TABS Take 20 mg by mouth every 12 (twelve) hours.     . pantoprazole (PROTONIX) 40 MG tablet Take 40 mg by mouth 2 (two) times daily.     . saxagliptin HCl (ONGLYZA) 2.5 MG TABS tablet Take 2.5 mg by mouth daily with  breakfast.     . tiZANidine (ZANAFLEX) 4 MG tablet Take 4 mg by mouth 2 (two) times daily as needed for muscle spasms.     . Cyanocobalamin (VITAMIN B-12 PO) Take 1 tablet by mouth daily.    . ondansetron (ZOFRAN) 8 MG tablet Take 1 tablet (8 mg total) by mouth every 8 (eight) hours as needed for nausea or vomiting. (Patient not taking: Reported on 04/07/2015) 20 tablet 1   No current facility-administered medications for this visit.    SURGICAL HISTORY:  Past Surgical History  Procedure Laterality Date  . Lung removal, partial Left 1987    "blood clot"  . Cholecystectomy    . Av fistula placement Left 2011    "lower arm; never used"  . Colonoscopy    . Abdominoplasty  ~ 1983    "tummy tuck"  . Abdominal hysterectomy  1964  . Av fistula placement Left 05/15/2013    Procedure: INSERTION OF ARTERIOVENOUS (AV) GORE-TEX GRAFT ARM-LEFT UPPER ARM;  Surgeon: Mal Misty, MD;  Location: [facility];  Service: Vascular;  Laterality: Left;  . Appendectomy  1954  . Umbilical hernia repair  ~ 1983  . Tubal ligation  1964  . Cystoscopy w/ stone manipulation  1980's  . Hernia repair      umbicical hernia  . Eye surgery Bilateral 2016    CAtaract-   . Av fistula placement Right 10/19/2014    Procedure: INSERTION OF ARTERIOVENOUS (AV) 4-32mm x 45cm GORE-TEX GRAFT RIGHT FOREARM;  Surgeon: Angelia Mould, MD;  Location: [facility];  Service: Vascular;  Laterality: Right;  . Av fistula placement Right 02/15/2015    Procedure: INSERTION OF RIGHT ARTERIOVENOUS (AV) GORE-TEX GRAFT ARM;  Surgeon: Angelia Mould, MD;  Location: [facility] OR;  Service: Vascular;  Laterality: Right;  . Thrombectomy w/ embolectomy Right 02/18/2015    Procedure: THROMBECTOMY RIGHT UPPER ARM ARTERIOVENOUS GORE-TEX GRAFT with revision of arterial anastamosis;  Surgeon: Mal Misty, MD;  Location: [facility] OR;  Service: Vascular;  Laterality: Right;    REVIEW OF SYSTEMS:  A comprehensive review of systems was negative except for:  Constitutional: positive for fatigue   PHYSICAL EXAMINATION: General appearance: alert, cooperative, fatigued and no distress Head: Normocephalic, without obvious abnormality, atraumatic Neck: no adenopathy Lymph nodes: Cervical, supraclavicular, and axillary nodes normal. Resp: clear to auscultation bilaterally Back: symmetric, no curvature. ROM normal. No CVA tenderness. Cardio: regular rate and rhythm, S1, S2 normal, no murmur, click, rub or gallop GI: soft, non-tender; bowel sounds normal; no masses,  no organomegaly Extremities: extremities normal, atraumatic, no cyanosis or edema Neurologic: Alert and oriented X 3, normal strength and tone. Normal symmetric reflexes. Normal coordination  and gait  ECOG PERFORMANCE STATUS: 2 - Symptomatic, <50% confined to bed  Blood pressure 76/47, pulse 66, temperature 98.6 F (37 C), temperature source Oral, resp. rate 17, height $RemoveBe'5\' 5"'tZOtGNIpO$  (1.651 m), weight 209 lb 6.4 oz (94.983 kg), SpO2 100 %.  LABORATORY DATA: Lab Results  Component Value Date   WBC 7.2 04/07/2015   HGB 10.5* 04/07/2015   HCT 32.9* 04/07/2015   MCV 98.5 04/07/2015   PLT 157 04/07/2015      Chemistry      Component Value Date/Time   NA 138 04/07/2015 0836   NA 135 02/18/2015 0625   K 4.0 04/07/2015 0836   K 4.8 02/18/2015 0625   CL 95* 01/23/2015 1200   CL 98 09/17/2012 1242   CO2 26 04/07/2015 0836   CO2 24 01/23/2015 1200   BUN 33.5* 04/07/2015 0836   BUN 68* 01/23/2015 1200   CREATININE 7.3* 04/07/2015 0836   CREATININE 9.86* 01/23/2015 1200      Component Value Date/Time   CALCIUM 9.2 04/07/2015 0836   CALCIUM 8.3* 01/23/2015 1200   CALCIUM 8.6 12/02/2012 0933   ALKPHOS 110 04/07/2015 0836   ALKPHOS 98 01/23/2015 1200   AST 16 04/07/2015 0836   AST 27 01/23/2015 1200   ALT <9 04/07/2015 0836   ALT 17 01/23/2015 1200   BILITOT 0.56 04/07/2015 0836   BILITOT 0.5 01/23/2015 1200     Other lab results:   ASSESSMENT: This is a very pleasant 81 year old  African American female with plasma cell dyscrasia currently on treatment with weekly subcutaneous Velcade and Decadron status post 52 weekly doses and tolerating it fairly well   Patient seen with Dr. Julien Nordmann. Protein studies reviewed and are stable to slightly improved. Gave patient the option of continuing treatment vs. going on observation. The patient would like to take a break. Will plan to have her return in early January to repeat her myeloma panel and see Dr. Julien Nordmann about 1 week after the labs to go over the results.   She was advised to talk with Nephrology regarding her hypotension.   She was advised to call immediately if she has any concerning symptoms in the interval.  Mikey Bussing, DNP, AGPCNP-BC, AOCNP    ADDENDUM: Hematology/Oncology Attending: I had a face to face encounter with the patient. I recommended her care plan. This is a very pleasant 81 years old African-American female with plasma cell dyscrasia as well as history of end-stage he had disease and currently on hemodialysis. The patient has been on treatment with subcutaneous Velcade and Decadron status post 52 cycles and has been tolerating it fairly well. She continues to have fatigue from the hemodialysis. The patient had repeat myeloma panel in that showed stable to slightly improved disease. I discussed the lab result with the patient and her daughter and gave her the option of continuous treatment versus observation and break off treatment during the holiday. The patient is interested in taking a break off the treatment. I will see her back for follow-up visit in 2 months for reevaluation with repeat myeloma panel. She was advised to call immediately if she has any concerning symptoms in the interval.  Disclaimer: This note was dictated with voice recognition software. Similar sounding words can inadvertently be transcribed and may be missed upon review.  Eilleen Kempf., MD 04/08/2015

## 2015-04-07 NOTE — Telephone Encounter (Signed)
Gave adn printed appt sched and avs fo rpt for Jan 2017 °

## 2015-04-11 ENCOUNTER — Encounter: Payer: Self-pay | Admitting: Vascular Surgery

## 2015-04-13 ENCOUNTER — Ambulatory Visit: Payer: Medicare Other | Admitting: Vascular Surgery

## 2015-04-14 ENCOUNTER — Ambulatory Visit: Payer: Medicare Other

## 2015-04-14 ENCOUNTER — Other Ambulatory Visit: Payer: Medicare Other

## 2015-06-09 ENCOUNTER — Encounter: Payer: Self-pay | Admitting: *Deleted

## 2015-06-09 ENCOUNTER — Other Ambulatory Visit (HOSPITAL_BASED_OUTPATIENT_CLINIC_OR_DEPARTMENT_OTHER): Payer: Medicare Other

## 2015-06-09 DIAGNOSIS — C9000 Multiple myeloma not having achieved remission: Secondary | ICD-10-CM | POA: Diagnosis not present

## 2015-06-09 LAB — COMPREHENSIVE METABOLIC PANEL
ALT: 12 U/L (ref 0–55)
ANION GAP: 14 meq/L — AB (ref 3–11)
AST: 16 U/L (ref 5–34)
Albumin: 3.4 g/dL — ABNORMAL LOW (ref 3.5–5.0)
Alkaline Phosphatase: 112 U/L (ref 40–150)
BUN: 28.3 mg/dL — ABNORMAL HIGH (ref 7.0–26.0)
CALCIUM: 8.8 mg/dL (ref 8.4–10.4)
CHLORIDE: 96 meq/L — AB (ref 98–109)
CO2: 28 meq/L (ref 22–29)
Creatinine: 5.7 mg/dL (ref 0.6–1.1)
EGFR: 7 mL/min/{1.73_m2} — ABNORMAL LOW (ref >90)
Glucose: 269 mg/dl — ABNORMAL HIGH (ref 70–140)
POTASSIUM: 3.3 meq/L — AB (ref 3.5–5.1)
Sodium: 138 mEq/L (ref 136–145)
Total Bilirubin: 0.48 mg/dL (ref 0.20–1.20)
Total Protein: 7.3 g/dL (ref 6.4–8.3)

## 2015-06-09 LAB — CBC WITH DIFFERENTIAL/PLATELET
BASO%: 0.5 % (ref 0.0–2.0)
Basophils Absolute: 0 10*3/uL (ref 0.0–0.1)
EOS ABS: 0.1 10*3/uL (ref 0.0–0.5)
EOS%: 1.1 % (ref 0.0–7.0)
HCT: 33.7 % — ABNORMAL LOW (ref 34.8–46.6)
HGB: 10.8 g/dL — ABNORMAL LOW (ref 11.6–15.9)
LYMPH%: 11.4 % — ABNORMAL LOW (ref 14.0–49.7)
MCH: 30.9 pg (ref 25.1–34.0)
MCHC: 32 g/dL (ref 31.5–36.0)
MCV: 96.6 fL (ref 79.5–101.0)
MONO#: 0.6 10*3/uL (ref 0.1–0.9)
MONO%: 7.8 % (ref 0.0–14.0)
NEUT%: 79.2 % — AB (ref 38.4–76.8)
NEUTROS ABS: 6.2 10*3/uL (ref 1.5–6.5)
PLATELETS: 193 10*3/uL (ref 145–400)
RBC: 3.49 10*6/uL — AB (ref 3.70–5.45)
RDW: 18.6 % — ABNORMAL HIGH (ref 11.2–14.5)
WBC: 7.8 10*3/uL (ref 3.9–10.3)
lymph#: 0.9 10*3/uL (ref 0.9–3.3)

## 2015-06-09 LAB — LACTATE DEHYDROGENASE: LDH: 174 U/L (ref 125–245)

## 2015-06-13 ENCOUNTER — Emergency Department (HOSPITAL_COMMUNITY): Payer: Medicare Other

## 2015-06-13 ENCOUNTER — Encounter (HOSPITAL_COMMUNITY): Payer: Self-pay | Admitting: *Deleted

## 2015-06-13 ENCOUNTER — Emergency Department (HOSPITAL_COMMUNITY)
Admission: EM | Admit: 2015-06-13 | Discharge: 2015-06-13 | Disposition: A | Discharge status: Home / Self Care | Payer: Medicare Other | Attending: Emergency Medicine | Admitting: Emergency Medicine

## 2015-06-13 DIAGNOSIS — J449 Chronic obstructive pulmonary disease, unspecified: Secondary | ICD-10-CM | POA: Insufficient documentation

## 2015-06-13 DIAGNOSIS — Y9289 Other specified places as the place of occurrence of the external cause: Secondary | ICD-10-CM | POA: Insufficient documentation

## 2015-06-13 DIAGNOSIS — S8262XA Displaced fracture of lateral malleolus of left fibula, initial encounter for closed fracture: Secondary | ICD-10-CM | POA: Diagnosis not present

## 2015-06-13 DIAGNOSIS — K219 Gastro-esophageal reflux disease without esophagitis: Secondary | ICD-10-CM | POA: Insufficient documentation

## 2015-06-13 DIAGNOSIS — W01198A Fall on same level from slipping, tripping and stumbling with subsequent striking against other object, initial encounter: Secondary | ICD-10-CM | POA: Insufficient documentation

## 2015-06-13 DIAGNOSIS — Z86711 Personal history of pulmonary embolism: Secondary | ICD-10-CM | POA: Diagnosis not present

## 2015-06-13 DIAGNOSIS — Z87442 Personal history of urinary calculi: Secondary | ICD-10-CM | POA: Diagnosis not present

## 2015-06-13 DIAGNOSIS — S82892A Other fracture of left lower leg, initial encounter for closed fracture: Secondary | ICD-10-CM

## 2015-06-13 DIAGNOSIS — W19XXXA Unspecified fall, initial encounter: Secondary | ICD-10-CM

## 2015-06-13 DIAGNOSIS — Z794 Long term (current) use of insulin: Secondary | ICD-10-CM | POA: Insufficient documentation

## 2015-06-13 DIAGNOSIS — G8929 Other chronic pain: Secondary | ICD-10-CM | POA: Insufficient documentation

## 2015-06-13 DIAGNOSIS — M1990 Unspecified osteoarthritis, unspecified site: Secondary | ICD-10-CM | POA: Diagnosis not present

## 2015-06-13 DIAGNOSIS — Y9389 Activity, other specified: Secondary | ICD-10-CM | POA: Insufficient documentation

## 2015-06-13 DIAGNOSIS — Z880 Allergy status to penicillin: Secondary | ICD-10-CM | POA: Insufficient documentation

## 2015-06-13 DIAGNOSIS — E1140 Type 2 diabetes mellitus with diabetic neuropathy, unspecified: Secondary | ICD-10-CM | POA: Diagnosis not present

## 2015-06-13 DIAGNOSIS — I120 Hypertensive chronic kidney disease with stage 5 chronic kidney disease or end stage renal disease: Secondary | ICD-10-CM | POA: Insufficient documentation

## 2015-06-13 DIAGNOSIS — Y998 Other external cause status: Secondary | ICD-10-CM | POA: Insufficient documentation

## 2015-06-13 DIAGNOSIS — S8992XA Unspecified injury of left lower leg, initial encounter: Secondary | ICD-10-CM | POA: Insufficient documentation

## 2015-06-13 DIAGNOSIS — E039 Hypothyroidism, unspecified: Secondary | ICD-10-CM | POA: Insufficient documentation

## 2015-06-13 DIAGNOSIS — M25572 Pain in left ankle and joints of left foot: Secondary | ICD-10-CM

## 2015-06-13 DIAGNOSIS — E782 Mixed hyperlipidemia: Secondary | ICD-10-CM | POA: Diagnosis not present

## 2015-06-13 DIAGNOSIS — N186 End stage renal disease: Secondary | ICD-10-CM | POA: Diagnosis not present

## 2015-06-13 DIAGNOSIS — Z992 Dependence on renal dialysis: Secondary | ICD-10-CM | POA: Insufficient documentation

## 2015-06-13 DIAGNOSIS — Z79899 Other long term (current) drug therapy: Secondary | ICD-10-CM | POA: Diagnosis not present

## 2015-06-13 DIAGNOSIS — Z862 Personal history of diseases of the blood and blood-forming organs and certain disorders involving the immune mechanism: Secondary | ICD-10-CM | POA: Insufficient documentation

## 2015-06-13 DIAGNOSIS — M79605 Pain in left leg: Secondary | ICD-10-CM

## 2015-06-13 DIAGNOSIS — S99912A Unspecified injury of left ankle, initial encounter: Secondary | ICD-10-CM | POA: Diagnosis not present

## 2015-06-13 LAB — CBG MONITORING, ED: Glucose-Capillary: 139 mg/dL — ABNORMAL HIGH (ref 65–99)

## 2015-06-13 LAB — KAPPA/LAMBDA LIGHT CHAINS
KAPPA FREE LGHT CHN: 15.2 mg/dL — AB (ref 0.33–1.94)
Kappa:Lambda Ratio: 1.09 (ref 0.26–1.65)
LAMBDA FREE LGHT CHN: 14 mg/dL — AB (ref 0.57–2.63)

## 2015-06-13 LAB — IGG, IGA, IGM
IGA: 160 mg/dL (ref 69–380)
IGG (IMMUNOGLOBIN G), SERUM: 1200 mg/dL (ref 690–1700)
IGM, SERUM: 84 mg/dL (ref 52–322)

## 2015-06-13 LAB — BETA 2 MICROGLOBULIN, SERUM: Beta-2 Microglobulin: 28.8 mg/L — ABNORMAL HIGH (ref <=2.51)

## 2015-06-13 MED ORDER — OXYCODONE-ACETAMINOPHEN 5-325 MG PO TABS
1.0000 | ORAL_TABLET | Freq: Once | ORAL | Status: AC
  Administered 2015-06-13: 1 via ORAL

## 2015-06-13 MED ORDER — OXYCODONE-ACETAMINOPHEN 5-325 MG PO TABS
ORAL_TABLET | ORAL | Status: AC
  Filled 2015-06-13: qty 1

## 2015-06-13 NOTE — ED Notes (Signed)
Ortho Tech paged.

## 2015-06-13 NOTE — ED Notes (Signed)
Ortho paged. 

## 2015-06-13 NOTE — Discharge Instructions (Signed)
Wear ankle splint at all times until you see the orthopedist. Use your home walker for all weight bearing activities. Ice and elevate ankle throughout the day. Alternate between ibuprofen and home percocet for pain relief. Do not drive or operate machinery with pain medication use. Call orthopedic follow up today or tomorrow to schedule followup appointment for recheck of ongoing ankle pain in 1 week. Return to the ER for changes or worsening symptoms.    Tibial Fracture, Adult A tibial fracture is a break in your tibia bone. The tibia is the large shin bone in your lower leg. The bone will be held in place with a cast or splint until it is healed. HOME CARE  If you have a cast:  Do not scratch under the cast.  Check the skin around the cast every day. You may put lotion on any red or sore areas.  Keep your cast dry and clean.  If you have a splint:  Wear the splint as told by your doctor.  Loosen the elastic around the splint if your toes get numb, tingle, or turn cold or blue.  Do not put pressure on the cast or splint until it is hard.  Do not put the cast or splint in water. Cover it with a plastic bag when bathing.  Use crutches as told by your doctor.  Take medicines only as told by your doctor.  Keep all follow-up visits as told by your doctor. This is important. GET HELP IF:  Your pain gets worse or is not controlled with medicine.  You have increased puffiness (swelling) or redness in your foot.  You start to lose feeling in your foot or toes. GET HELP RIGHT AWAY IF:  Your foot or toes get cold or turn blue.  You have bad pain in your leg, especially if it gets worse when you move your toes. MAKE SURE YOU:  Understand these instructions.  Will watch your condition.  Will get help right away if you are not doing well or get worse.   This information is not intended to replace advice given to you by your health care provider. Make sure you discuss any  questions you have with your health care provider.   Document Released: 06/23/2010 Document Revised: 10/05/2014 Document Reviewed: 07/15/2013 Elsevier Interactive Patient Education 2016 Else[employer]  Cryotherapy Cryotherapy is when you put ice on your injury. Ice helps lessen pain and puffiness (swelling) after an injury. Ice works the best when you start using it in the first 24 to 48 hours after an injury. HOME CARE  Put a dry or damp towel between the ice pack and your skin.  You may press gently on the ice pack.  Leave the ice on for no more than 10 to 20 minutes at a time.  Check your skin after 5 minutes to make sure your skin is okay.  Rest at least 20 minutes between ice pack uses.  Stop using ice when your skin loses feeling (numbness).  Do not use ice on someone who cannot tell you when it hurts. This includes small children and people with memory problems (dementia). GET HELP RIGHT AWAY IF:  You have white spots on your skin.  Your skin turns blue or pale.  Your skin feels waxy or hard.  Your puffiness gets worse. MAKE SURE YOU:   Understand these instructions.  Will watch your condition.  Will get help right away if you are not doing well or get worse.  This information is not intended to replace advice given to you by your health care provider. Make sure you discuss any questions you have with your health care provider.   Document Released: 11/07/2007 Document Revised: 08/13/2011 Document Reviewed: 01/11/2011 Elsevier Interactive Patient Education [employer].

## 2015-06-13 NOTE — Progress Notes (Signed)
Orthopedic Tech Progress Note Patient Details:  [name] 1933/01/31 [id number]  Ortho Devices Type of Ortho Device: CAM walker Ortho Device/Splint Location: lle Ortho Device/Splint Interventions: Ordered, Application   Karolee Stamps 06/13/2015, 8:12 PM

## 2015-06-13 NOTE — ED Provider Notes (Signed)
CSN: [id number]     Arrival date & time 06/13/15  1334 History   First MD Initiated Contact with Patient 06/13/15 1717     Chief Complaint  Patient presents with  . Fall  . Ankle Pain     (Consider location/radiation/quality/duration/timing/severity/associated sxs/prior Treatment) HPI Comments: [name] is a 82 y.o. female with a PMHx of HTN, asthma, GERD, DM2, COPD, anemia, HLD, PE in 1987, gout, multiple myeloma, hypothyroidism, ESRD on dialysis MWF, nephrolithiasis, constipation, and neuropathy, who presents to the ED with complaints of mechanical fall around 7:50 AM when she slipped on the ice landing on her left ankle and thigh. She is present pain is 10/10 constant sharp pain in the left ankle radiating up her entire leg into her thigh,  with no known aggravating factors given that she has not tried anything, and unrelieved with Percocet given in triage. Associated symptoms include left ankle swelling. She denies any new numbness or tingling different from baseline, focal weakness, back or neck pain, head injury or loss of consciousness, hip pain, chest pain, shortness breath, abdominal pain, nausea, or vomiting. She did not feel presyncopal prior to the fall. She denies any bruising or abrasions.  Patient is a 82 y.o. female presenting with fall and ankle pain. The history is provided by the patient. No language interpreter was used.  Fall This is a new problem. The current episode started today. The problem occurs rarely. The problem has been unchanged. Associated symptoms include arthralgias (L leg/ankle) and joint swelling (L ankle). Pertinent negatives include no abdominal pain, chest pain, headaches, myalgias, nausea, neck pain, numbness, visual change, vomiting or weakness. The symptoms are aggravated by walking. She has tried oral narcotics for the symptoms. The treatment provided moderate relief.  Ankle Pain Associated symptoms: no back pain and no neck pain     Past  Medical History  Diagnosis Date  . Hypertension   . Asthma   . GERD (gastroesophageal reflux disease)   . Neuropathy in diabetes ([place])     Hx: of  . COPD (chronic obstructive pulmonary disease) ([place])   . Anemia   . High cholesterol   . Pulmonary embolism ([place]) 1987  . History of blood transfusion 1987; 2011    "related to lung OR; HgB went down to 5"  . Arthritis     "hips, knees, shoulders" (11/10/2013)  . Chronic lower back pain   . Gout   . Multiple myeloma ([place])   . Neuropathy ([employer])   . Hypothyroidism   . Type II diabetes mellitus ([place])   . ESRD (end stage renal disease) on dialysis [facility])     "1st treatment 11/09/2013" MWF.  [address]  . Kidney stones   . Constipation    Past Surgical History  Procedure Laterality Date  . Lung removal, partial Left 1987    "blood clot"  . Cholecystectomy    . Av fistula placement Left 2011    "lower arm; never used"  . Colonoscopy    . Abdominoplasty  ~ 1983    "tummy tuck"  . Abdominal hysterectomy  1964  . Av fistula placement Left 05/15/2013    Procedure: INSERTION OF ARTERIOVENOUS (AV) GORE-TEX GRAFT ARM-LEFT UPPER ARM;  Surgeon: Mal Misty, MD;  Location: [place];  Service: Vascular;  Laterality: Left;  . Appendectomy  1954  . Umbilical hernia repair  ~ 1983  . Tubal ligation  1964  . Cystoscopy w/ stone manipulation  1980's  . Hernia repair  umbicical hernia  . Eye surgery Bilateral 2016    CAtaract-   . Av fistula placement Right 10/19/2014    Procedure: INSERTION OF ARTERIOVENOUS (AV) 4-28m x 45cm GORE-TEX GRAFT RIGHT FOREARM;  Surgeon: CAngelia Mould MD;  [facility]  Service: Vascular;  Laterality: Right;  . Av fistula placement Right 02/15/2015    Procedure: INSERTION OF RIGHT ARTERIOVENOUS (AV) GORE-TEX GRAFT ARM;  Surgeon: CAngelia Mould MD;  [facility];  Service: Vascular;  Laterality: Right;  . Thrombectomy w/ embolectomy Right 02/18/2015    Procedure: THROMBECTOMY RIGHT UPPER ARM  ARTERIOVENOUS GORE-TEX GRAFT with revision of arterial anastamosis;  Surgeon: JMal Misty MD;  [facility];  Service: Vascular;  Laterality: Right;   Family History  Problem Relation Age of Onset  . Blindness Father   . Diabetes Father   . Diabetes Sister   . Kidney failure Brother    Social History  Substance Use Topics  . Smoking status: Never Smoker   . Smokeless tobacco: Never Used  . Alcohol Use: No   OB History    No data available     Review of Systems  HENT: Negative for facial swelling (no head inj).   Eyes: Negative for visual disturbance.  Respiratory: Negative for shortness of breath.   Cardiovascular: Negative for chest pain.  Gastrointestinal: Negative for nausea, vomiting and abdominal pain.  Musculoskeletal: Positive for joint swelling (L ankle) and arthralgias (L leg/ankle). Negative for myalgias, back pain and neck pain.  Skin: Negative for color change and wound.  Allergic/Immunologic: Positive for immunocompromised state (ESRD, DM2).  Neurological: Negative for syncope, weakness, light-headedness, numbness and headaches.  Psychiatric/Behavioral: Negative for confusion.   10 Systems reviewed and are negative for acute change except as noted in the HPI.    Allergies  Penicillins cross reactors  Home Medications   Prior to Admission medications   Medication Sig Start Date End Date Taking? Authorizing Provider  acyclovir (ZOVIRAX) 400 MG tablet TAKE 1 TABLET(400 MG) BY MOUTH DAILY 03/25/15   MCurt Bears MD  albuterol (PROVENTIL HFA;VENTOLIN HFA) 108 (90 BASE) MCG/ACT inhaler Inhale 1 puff into the lungs 2 (two) times daily as needed for wheezing or shortness of breath.    Historical Provider, MD  allopurinol (ZYLOPRIM) 100 MG tablet Take 100 mg by mouth daily with breakfast.     Historical Provider, MD  B-D ULTRAFINE III SHORT PEN 31G X 8 MM MISC  07/19/14   Historical Provider, MD  Cyanocobalamin (VITAMIN B-12 PO) Take 1 tablet by mouth daily.     Historical Provider, MD  dexamethasone (DECADRON) 4 MG tablet TAKE 10 TABLETS(40 MG) BY MOUTH 1 TIME A WEEK 03/25/15   MCurt Bears MD  ergocalciferol (VITAMIN D2) 50000 UNITS capsule Take 50,000 Units by mouth once a week.    Historical Provider, MD  gabapentin (NEURONTIN) 100 MG capsule Take 100 mg by mouth 3 (three) times daily.    Historical Provider, MD  glimepiride (AMARYL) 1 MG tablet Take 1 mg by mouth daily with breakfast.     Historical Provider, MD  HYDROcodone-acetaminophen (NORCO) 10-325 MG per tablet TK 1 T PO  Q 6 H PRN P 01/11/15   Historical Provider, MD  hydrOXYzine (ATARAX/VISTARIL) 25 MG tablet Take 25 mg by mouth every 12 (twelve) hours as needed for itching.  09/07/14   Historical Provider, MD  insulin aspart (NOVOLOG) 100 UNIT/ML injection Inject 5 Units into the skin daily as needed for high blood sugar (if  blood sugar is over 200).    Historical Provider, MD  LEVEMIR FLEXTOUCH 100 UNIT/ML Pen Inject 12 Units into the skin daily as needed (high blood sugar). If Blood sugar is above 110 pt will take 09/15/14   Historical Provider, MD  levothyroxine (SYNTHROID, LEVOTHROID) 100 MCG tablet Take 100 mcg by mouth daily. 09/01/14   Historical Provider, MD  lidocaine-prilocaine (EMLA) cream Apply 1 application topically as needed (for numbing).  11/06/13   Historical Provider, MD  multivitamin (RENA-VIT) TABS tablet Take 1 tablet by mouth daily.    Historical Provider, MD  ondansetron (ZOFRAN) 8 MG tablet Take 1 tablet (8 mg total) by mouth every 8 (eight) hours as needed for nausea or vomiting. Patient not taking: Reported on 04/07/2015 08/05/13   Curt Bears, MD  ONE [facility] ULTRA TEST test strip  12/05/13   Historical Provider, MD  Oxycodone HCl 20 MG TABS Take 20 mg by mouth every 12 (twelve) hours.  06/07/13   Historical Provider, MD  pantoprazole (PROTONIX) 40 MG tablet Take 40 mg by mouth 2 (two) times daily.     Historical Provider, MD  saxagliptin HCl (ONGLYZA) 2.5 MG TABS tablet  Take 2.5 mg by mouth daily with breakfast.     Historical Provider, MD  tiZANidine (ZANAFLEX) 4 MG tablet Take 4 mg by mouth 2 (two) times daily as needed for muscle spasms.  01/11/15   Historical Provider, MD   BP 135/84 mmHg  Pulse 88  Temp(Src) 98.2 F (36.8 C) (Oral)  Resp 18  SpO2 97% Physical Exam  Constitutional: She is oriented to person, place, and time. Vital signs are normal. She appears well-developed and well-nourished.  Non-toxic appearance. No distress.  Afebrile, nontoxic, NAD  HENT:  Head: Normocephalic and atraumatic.  Mouth/Throat: Oropharynx is clear and moist and mucous membranes are normal.  Eyes: Conjunctivae and EOM are normal. Right eye exhibits no discharge. Left eye exhibits no discharge.  Neck: Normal range of motion. Neck supple. No spinous process tenderness and no muscular tenderness present. No rigidity. Normal range of motion present.  FROM intact without spinous process TTP, no bony stepoffs or deformities, no paraspinous muscle TTP or muscle spasms. No rigidity or meningeal signs. No bruising or swelling.   Cardiovascular: Normal rate, regular rhythm, normal heart sounds and intact distal pulses.  Exam reveals no gallop and no friction rub.   No murmur heard. Pulmonary/Chest: Effort normal and breath sounds normal. No respiratory distress. She has no decreased breath sounds. She has no wheezes. She has no rhonchi. She has no rales.  Abdominal: Soft. Normal appearance and bowel sounds are normal. She exhibits no distension. There is no tenderness. There is no rigidity, no rebound and no guarding.  Musculoskeletal:       Left hip: Normal.       Left knee: She exhibits decreased range of motion (due to pain) and swelling. She exhibits no ecchymosis, no deformity, no erythema, normal alignment, no LCL laxity, normal patellar mobility and no MCL laxity. Tenderness found. Medial joint line and lateral joint line tenderness noted.       Left ankle: She exhibits  decreased range of motion (due to pain) and swelling. She exhibits no ecchymosis, no deformity, no laceration and normal pulse. Tenderness. Lateral malleolus and medial malleolus tenderness found. Achilles tendon normal.       Left upper leg: She exhibits tenderness. She exhibits no swelling and no deformity.       Legs:  Feet:  All spinal levels nonTTP without crepitus or deformity L ankle with limited ROM due to pain, +swelling without deformity, with moderate TTP of lateral and medial malleolus but no TTP or swelling of fore foot. Mild TTP along tib/fib and into knee and femur. No break in skin. No bruising or erythema. No warmth. Achilles intact. Good pedal pulse and cap refill of all toes. Wiggling toes without difficulty. Sensation grossly intact.  L knee with slightly limited ROM due to pain, with mild joint line TTP, slight swelling without effusion/deformity, no bruising, no abnormal alignment or patellar mobility, no varus/valgus laxity, neg anterior drawer test, no crepitus.  L hip nonTTP without pain upon pelvic squeeze. Neg log-roll test. No abnormal alignment or rotation of leg. No limb length discrepancy  Neurological: She is alert and oriented to person, place, and time. She has normal strength. No sensory deficit.  Skin: Skin is warm, dry and intact. No abrasion, no bruising and no rash noted.  No bruising/abrasions noted  Psychiatric: She has a normal mood and affect.  Nursing note and vitals reviewed.   ED Course  Procedures (including critical care time) Labs Review Labs Reviewed  CBG MONITORING, ED - Abnormal; Notable for the following:    Glucose-Capillary 139 (*)    All other components within normal limits    Imaging Review Dg Tibia/fibula Left  06/13/2015  CLINICAL DATA:  82 year old female with history of trauma from a fall on ice today complaining of left leg pain. EXAM: LEFT TIBIA AND FIBULA - 2 VIEW COMPARISON:  No priors. FINDINGS: Four views of the left  tibia and fibula demonstrate some subtle cortical separation along the anterior aspect of the distal tibia immediately proximal to the tibiotalar joint, and overlying the medial malleolus. Fibula appears intact. Ankle mortise appears preserved. Large enthesophyte off the dorsal aspect of the calcaneus incidentally noted. IMPRESSION: 1. Subtle minimally displaced cortical fractures of the anterior aspect of the distal tibia and medial malleolus, as above. 2. Fibula is intact. Electronically Signed   By: Vinnie Langton M.D.   On: 06/13/2015 18:45   Dg Ankle Complete Left  06/13/2015  CLINICAL DATA:  Fall on ice, left ankle pain and swelling EXAM: LEFT ANKLE COMPLETE - 3+ VIEW COMPARISON:  None. FINDINGS: Three views of the left ankle submitted. There is diffuse soft tissue swelling. Study is limited by diffuse osteopenia. Spurring of distal tibia. There is tiny avulsion fracture at the tip of distal tibia medial malleolus. On lateral view there is a bone fragment just superior to posterior aspect of calcaneus. Avulsion fracture or fragmented osteophyte at Achilles tendon insertion cannot be excluded. Clinical correlation is necessary. Small plantar spur of calcaneus. Ankle mortise is preserved. IMPRESSION: There is tiny avulsion fracture at the tip of distal tibia medial malleolus. On lateral view there is a bone fragment just superior to posterior aspect of calcaneus. Avulsion fracture or fragmented osteophyte at Achilles tendon insertion cannot be excluded. Clinical correlation is necessary. Small plantar spur of calcaneus. Ankle mortise is preserved. Electronically Signed   By: Lahoma Crocker M.D.   On: 06/13/2015 15:52   Dg Knee Complete 4 Views Left  06/13/2015  CLINICAL DATA:  82 year old female with history of trauma from a fall on the ice today complaining of left-sided leg pain. EXAM: LEFT KNEE - COMPLETE 4+ VIEW COMPARISON:  Left knee radiograph 08/05/2011. FINDINGS: Four views of the left knee demonstrate  no acute displaced fracture, subluxation or dislocation. There is joint space narrowing,  subchondral sclerosis, subchondral cyst formation and osteophyte formation, in a tricompartmental distribution, compatible with tricompartmental osteoarthritis. Moderate suprapatellar effusion. IMPRESSION: 1. Negative for fracture. 2. Moderate suprapatellar effusion. 3. Degenerative changes of moderate tricompartmental osteoarthritis, as above. Electronically Signed   By: Vinnie Langton M.D.   On: 06/13/2015 18:42   Dg Femur Min 2 Views Left  06/13/2015  CLINICAL DATA:  Fall on ice with left leg pain.  Ankle fractures. EXAM: LEFT FEMUR 2 VIEWS COMPARISON:  None. FINDINGS: Moderate knee effusion. Osteoarthritis with tricompartmental spurring and loss of articular space. I do not see a femur fracture. Suspected heterotopic ossification along the MCL. IMPRESSION: 1. Osteoarthritis of the knee with a moderate knee effusion. 2. 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 Electronically Signed   By: Van Clines M.D.   On: 06/13/2015 18:43   I have personally reviewed and evaluated these images and lab results as part of my medical decision-making.   EKG Interpretation None      MDM   Final diagnoses:  Closed left ankle fracture, initial encounter  Fall, initial encounter  Left ankle pain  Left leg pain    82 y.o. female here with mechanical fall on the ice this morning, L leg/ankle pain since then. Swelling in L ankle. NVI with soft compartments. Tenderness to both malleolus of L ankle, and tenderness in tib/fib, femur, and knee. Xray on L ankle showing tiny avulsion fx of distal tibia. Pain meds given in triage, with some relief. Will obtain imaging of tib/fib, femur, and knee. No other areas of tenderness. Will reassess after xray.   6:58 PM Xray of femur neg, knee showing arthritis but no acute fracture. Xray of tib/fib again showing avulsion fx of distal tibia. Will apply  CAM walker. Pt has walker at home. Discussed RICE and home pain medications for pain. F/up with Dr. Sharol Given in 1wk. I explained the diagnosis and have given explicit precautions to return to the ER including for any other new or worsening symptoms. The patient understands and accepts the medical plan as it's been dictated and I have answered their questions. Discharge instructions concerning home care and prescriptions have been given. The patient is STABLE and is discharged to home in good condition.  BP 114/69 mmHg  Pulse 73  Temp(Src) 98.2 F (36.8 C) (Oral)  Resp 18  SpO2 94%  Meds ordered this encounter  Medications  . oxyCODONE-acetaminophen (PERCOCET/ROXICET) 5-325 MG per tablet 1 tablet    Sig:   . oxyCODONE-acetaminophen (PERCOCET/ROXICET) 5-325 MG per tablet    Sig:     Westley Chandler   : cabinet override     [employer], PA-C 06/13/15 1931  Dorie Rank, MD 06/13/15 1949

## 2015-06-13 NOTE — ED Notes (Signed)
Ortho aware of need for splint 

## 2015-06-13 NOTE — ED Notes (Signed)
Pt given turkey sandwich and Sprite 

## 2015-06-13 NOTE — ED Notes (Signed)
Warm blankets and ice pack given, leg elevated on chair

## 2015-06-13 NOTE — ED Notes (Signed)
Pt reports having a fall today, slipped on ice. Having left ankle pain and swelling. Dialysis pt, had treatment this am.

## 2015-06-16 ENCOUNTER — Ambulatory Visit (HOSPITAL_BASED_OUTPATIENT_CLINIC_OR_DEPARTMENT_OTHER): Payer: Medicare Other | Admitting: Internal Medicine

## 2015-06-16 ENCOUNTER — Telehealth: Payer: Self-pay | Admitting: Internal Medicine

## 2015-06-16 ENCOUNTER — Encounter: Payer: Self-pay | Admitting: Internal Medicine

## 2015-06-16 VITALS — BP 131/70 | HR 85 | Temp 98.0°F | Resp 17 | Ht 65.0 in | Wt 204.7 lb

## 2015-06-16 DIAGNOSIS — D631 Anemia in chronic kidney disease: Secondary | ICD-10-CM | POA: Diagnosis not present

## 2015-06-16 DIAGNOSIS — Z992 Dependence on renal dialysis: Secondary | ICD-10-CM

## 2015-06-16 DIAGNOSIS — N189 Chronic kidney disease, unspecified: Secondary | ICD-10-CM | POA: Diagnosis not present

## 2015-06-16 DIAGNOSIS — C9000 Multiple myeloma not having achieved remission: Secondary | ICD-10-CM | POA: Diagnosis not present

## 2015-06-16 NOTE — Telephone Encounter (Signed)
Gv pt appts for 3/9 + 3/16.

## 2015-06-16 NOTE — Progress Notes (Signed)
[place] [phone]   [phone]  OFFICE PROGRESS NOTE  [name], MD [place] [address]  DIAGNOSIS:  #1 plasma cell dyscrasia, multiple myeloma.  #2 anemia of chronic disease secondary to chronic kidney disease.   PRIOR THERAPY: Treatment with single agent subcutaneous Velcade 1.3 mg/m2 on a weekly basis with Decadron 40 mg by mouth weekly, status post 52 cycles. Last dose was given on 07/27/2014.  CURRENT THERAPY: Observation.   INTERVAL HISTORY: [name] 82 y.o. female returns to the clinic today for followup visit accompanied by her granddaughter. The patient continues her hemodialysis on Monday, Wednesday and Friday. She is currently on observation for the last 2 months. She fell a few days ago on icy road when she was going for the dialysis. She sustained a fracture of the distal tibial. Otherwise she is feeling fine today. She denied having any fever or chills, no nausea or vomiting. The patient denied having any significant chest pain, shortness of breath, cough or hemoptysis. She has no significant weight loss or night sweats. She had repeat myeloma panel and she is here today for evaluation and discussion of her lab results.  MEDICAL HISTORY: Past Medical History  Diagnosis Date  . Hypertension   . Asthma   . GERD (gastroesophageal reflux disease)   . Neuropathy in diabetes ([place])     Hx: of  . COPD (chronic obstructive pulmonary disease) ([place])   . Anemia   . High cholesterol   . Pulmonary embolism ([place]) 1987  . History of blood transfusion 1987; 2011    "related to lung OR; HgB went down to 5"  . Arthritis     "hips, knees, shoulders" (11/10/2013)  . Chronic lower back pain   . Gout   . Multiple myeloma ([place])   . Neuropathy ([place])   . Hypothyroidism   . Type II diabetes mellitus ([place])   . ESRD (end stage renal disease) on dialysis [facility])     "1st treatment 11/09/2013" MWF.  [place]  [facility]  . Kidney stones   . Constipation     ALLERGIES:  is allergic to penicillins cross reactors.  MEDICATIONS:  Current Outpatient Prescriptions  Medication Sig Dispense Refill  . acyclovir (ZOVIRAX) 400 MG tablet TAKE 1 TABLET(400 MG) BY MOUTH DAILY 30 tablet 0  . albuterol (PROVENTIL HFA;VENTOLIN HFA) 108 (90 BASE) MCG/ACT inhaler Inhale 1 puff into the lungs 2 (two) times daily as needed for wheezing or shortness of breath.    . allopurinol (ZYLOPRIM) 100 MG tablet Take 100 mg by mouth daily with breakfast.     . B-D ULTRAFINE III SHORT PEN 31G X 8 MM MISC   4  . cinacalcet (SENSIPAR) 30 MG tablet Take 30 mg by mouth daily.    . Cyanocobalamin (VITAMIN B-12 PO) Take 1 tablet by mouth daily.    Marland Kitchen dexamethasone (DECADRON) 4 MG tablet TAKE 10 TABLETS(40 MG) BY MOUTH 1 TIME A WEEK (Patient not taking: Reported on 06/13/2015) 40 tablet 0  . ergocalciferol (VITAMIN D2) 50000 UNITS capsule Take 50,000 Units by mouth once a week. Take on Thursdays    . gabapentin (NEURONTIN) 100 MG capsule Take 100 mg by mouth 3 (three) times daily.    Marland Kitchen glimepiride (AMARYL) 1 MG tablet Take 1 mg by mouth daily with breakfast.     . HYDROcodone-acetaminophen (NORCO) 10-325 MG per tablet TK 1 T PO  Q 6 H PRN P  0  . hydrOXYzine (ATARAX/VISTARIL) 25 MG tablet Take 25 mg by mouth every 12 (twelve) hours as needed for itching.   2  . insulin aspart (NOVOLOG) 100 UNIT/ML injection Inject 5 Units into the skin daily as needed for high blood sugar (if blood sugar is over 200).    Titus Dubin FLEXTOUCH 100 UNIT/ML Pen Inject 12 Units into the skin daily as needed (high blood sugar). If Blood sugar is above 110 pt will take  5  . levothyroxine (SYNTHROID, LEVOTHROID) 100 MCG tablet Take 100 mcg by mouth daily.  4  . lidocaine-prilocaine (EMLA) cream Apply 1 application topically daily as needed (for numbing).     . multivitamin (RENA-VIT) TABS tablet Take 1 tablet by mouth daily.    . ondansetron (ZOFRAN) 8 MG tablet Take 1  tablet (8 mg total) by mouth every 8 (eight) hours as needed for nausea or vomiting. (Patient not taking: Reported on 04/07/2015) 20 tablet 1  . ONE TOUCH ULTRA TEST test strip     . Oxycodone HCl 20 MG TABS Take 20 mg by mouth every 12 (twelve) hours.     . pantoprazole (PROTONIX) 40 MG tablet Take 40 mg by mouth 2 (two) times daily.     . saxagliptin HCl (ONGLYZA) 2.5 MG TABS tablet Take 2.5 mg by mouth daily with breakfast.     . tiZANidine (ZANAFLEX) 4 MG tablet Take 4 mg by mouth 2 (two) times daily as needed for muscle spasms.      No current facility-administered medications for this visit.    SURGICAL HISTORY:  Past Surgical History  Procedure Laterality Date  . Lung removal, partial Left 1987    "blood clot"  . Cholecystectomy    . Av fistula placement Left 2011    "lower arm; never used"  . Colonoscopy    . Abdominoplasty  ~ 1983    "tummy tuck"  . Abdominal hysterectomy  1964  . Av fistula placement Left 05/15/2013    Procedure: INSERTION OF ARTERIOVENOUS (AV) GORE-TEX GRAFT ARM-LEFT UPPER ARM;  Surgeon: Pryor Ochoa, MD;  Location: [facility] OR;  Service: Vascular;  Laterality: Left;  . Appendectomy  1954  . Umbilical hernia repair  ~ 1983  . Tubal ligation  1964  . Cystoscopy w/ stone manipulation  1980's  . Hernia repair      umbicical hernia  . Eye surgery Bilateral 2016    CAtaract-   . Av fistula placement Right 10/19/2014    Procedure: INSERTION OF ARTERIOVENOUS (AV) 4-52mm x 45cm GORE-TEX GRAFT RIGHT FOREARM;  Surgeon: Chuck Hint, MD;  Location: [facility] OR;  Service: Vascular;  Laterality: Right;  . Av fistula placement Right 02/15/2015    Procedure: INSERTION OF RIGHT ARTERIOVENOUS (AV) GORE-TEX GRAFT ARM;  Surgeon: Chuck Hint, MD;  Location: [facility] OR;  Service: Vascular;  Laterality: Right;  . Thrombectomy w/ embolectomy Right 02/18/2015    Procedure: THROMBECTOMY RIGHT UPPER ARM ARTERIOVENOUS GORE-TEX GRAFT with revision of arterial anastamosis;   Surgeon: Pryor Ochoa, MD;  Location: [facility] OR;  Service: Vascular;  Laterality: Right;    REVIEW OF SYSTEMS:  A comprehensive review of systems was negative except for: Constitutional: positive for fatigue   PHYSICAL EXAMINATION: General appearance: alert, cooperative, fatigued and no distress Head: Normocephalic, without obvious abnormality, atraumatic Neck: no adenopathy Lymph nodes: Cervical, supraclavicular, and axillary nodes normal. Resp: clear to auscultation bilaterally Back: symmetric, no curvature. ROM normal. No CVA tenderness. Cardio: regular rate and rhythm, S1,  S2 normal, no murmur, click, rub or gallop GI: soft, non-tender; bowel sounds normal; no masses,  no organomegaly Extremities: extremities normal, atraumatic, no cyanosis or edema Neurologic: Alert and oriented X 3, normal strength and tone. Normal symmetric reflexes. Normal coordination and gait  ECOG PERFORMANCE STATUS: 2 - Symptomatic, <50% confined to bed  Blood pressure 131/70, pulse 85, temperature 98 F (36.7 C), temperature source Oral, resp. rate 17, height '5\' 5"'$  (1.651 m), weight 204 lb 11.2 oz (92.851 kg), SpO2 100 %.  LABORATORY DATA: Lab Results  Component Value Date   WBC 7.8 06/09/2015   HGB 10.8* 06/09/2015   HCT 33.7* 06/09/2015   MCV 96.6 06/09/2015   PLT 193 06/09/2015      Chemistry      Component Value Date/Time   NA 138 06/09/2015 0820   NA 135 02/18/2015 0625   K 3.3* 06/09/2015 0820   K 4.8 02/18/2015 0625   CL 95* 01/23/2015 1200   CL 98 09/17/2012 1242   CO2 28 06/09/2015 0820   CO2 24 01/23/2015 1200   BUN 28.3* 06/09/2015 0820   BUN 68* 01/23/2015 1200   CREATININE 5.7* 06/09/2015 0820   CREATININE 9.86* 01/23/2015 1200      Component Value Date/Time   CALCIUM 8.8 06/09/2015 0820   CALCIUM 8.3* 01/23/2015 1200   CALCIUM 8.6 12/02/2012 0933   ALKPHOS 112 06/09/2015 0820   ALKPHOS 98 01/23/2015 1200   AST 16 06/09/2015 0820   AST 27 01/23/2015 1200   ALT 12  06/09/2015 0820   ALT 17 01/23/2015 1200   BILITOT 0.48 06/09/2015 0820   BILITOT 0.5 01/23/2015 1200     Other lab results:   ASSESSMENT: This is a very pleasant 82 years old [place] American female with plasma cell dyscrasia completed treatment with weekly subcutaneous Velcade and Decadron status post 52 weekly doses and tolerating it fairly well. The patient is feeling fine today except for mild fatigue and recent fracture of the left ankle. Her myeloma panel showed mild increase in the free Light chain. I discussed the lab result with the patient today. I recommended for her to continue on observation for now. I will see her back for follow-up visit in 2 months for reevaluation after repeating myeloma panel. For the dysphagia disease, the patient will continue hemodialysis by nephrology. She was advised to call immediately if she has any concerning symptoms in the interval.  Disclaimer: This note was dictated with voice recognition software. Similar sounding words can inadvertently be transcribed and may not be corrected upon review.

## 2015-06-24 ENCOUNTER — Other Ambulatory Visit: Payer: Self-pay | Admitting: Orthopaedic Surgery

## 2015-06-24 DIAGNOSIS — S82309A Unspecified fracture of lower end of unspecified tibia, initial encounter for closed fracture: Secondary | ICD-10-CM

## 2015-06-27 ENCOUNTER — Ambulatory Visit
Admission: RE | Admit: 2015-06-27 | Discharge: 2015-06-27 | Disposition: A | Discharge status: Home / Self Care | Payer: Medicare Other | Source: Ambulatory Visit | Attending: Orthopaedic Surgery | Admitting: Orthopaedic Surgery

## 2015-06-27 ENCOUNTER — Other Ambulatory Visit: Payer: Self-pay | Admitting: Orthopaedic Surgery

## 2015-06-27 DIAGNOSIS — S82309A Unspecified fracture of lower end of unspecified tibia, initial encounter for closed fracture: Secondary | ICD-10-CM

## 2015-08-11 ENCOUNTER — Other Ambulatory Visit (HOSPITAL_BASED_OUTPATIENT_CLINIC_OR_DEPARTMENT_OTHER): Payer: Medicare Other

## 2015-08-11 DIAGNOSIS — C9000 Multiple myeloma not having achieved remission: Secondary | ICD-10-CM | POA: Diagnosis not present

## 2015-08-11 LAB — COMPREHENSIVE METABOLIC PANEL
ALT: 11 U/L (ref 0–55)
ANION GAP: 14 meq/L — AB (ref 3–11)
AST: 16 U/L (ref 5–34)
Albumin: 3.3 g/dL — ABNORMAL LOW (ref 3.5–5.0)
Alkaline Phosphatase: 116 U/L (ref 40–150)
BILIRUBIN TOTAL: 0.54 mg/dL (ref 0.20–1.20)
BUN: 27.9 mg/dL — ABNORMAL HIGH (ref 7.0–26.0)
CALCIUM: 8.5 mg/dL (ref 8.4–10.4)
CHLORIDE: 97 meq/L — AB (ref 98–109)
CO2: 27 mEq/L (ref 22–29)
CREATININE: 5.8 mg/dL — AB (ref 0.6–1.1)
EGFR: 7 mL/min/{1.73_m2} — AB (ref >90)
Glucose: 260 mg/dl — ABNORMAL HIGH (ref 70–140)
Potassium: 3.6 mEq/L (ref 3.5–5.1)
Sodium: 138 mEq/L (ref 136–145)
TOTAL PROTEIN: 7.4 g/dL (ref 6.4–8.3)

## 2015-08-11 LAB — CBC WITH DIFFERENTIAL/PLATELET
BASO%: 0.7 % (ref 0.0–2.0)
BASOS ABS: 0 10*3/uL (ref 0.0–0.1)
EOS ABS: 0.1 10*3/uL (ref 0.0–0.5)
EOS%: 2 % (ref 0.0–7.0)
HEMATOCRIT: 35.5 % (ref 34.8–46.6)
HGB: 11.5 g/dL — ABNORMAL LOW (ref 11.6–15.9)
LYMPH%: 11.6 % — AB (ref 14.0–49.7)
MCH: 30.6 pg (ref 25.1–34.0)
MCHC: 32.3 g/dL (ref 31.5–36.0)
MCV: 94.7 fL (ref 79.5–101.0)
MONO#: 0.6 10*3/uL (ref 0.1–0.9)
MONO%: 9.3 % (ref 0.0–14.0)
NEUT#: 4.9 10*3/uL (ref 1.5–6.5)
NEUT%: 76.4 % (ref 38.4–76.8)
PLATELETS: 236 10*3/uL (ref 145–400)
RBC: 3.74 10*6/uL (ref 3.70–5.45)
RDW: 18.4 % — ABNORMAL HIGH (ref 11.2–14.5)
WBC: 6.4 10*3/uL (ref 3.9–10.3)
lymph#: 0.7 10*3/uL — ABNORMAL LOW (ref 0.9–3.3)

## 2015-08-11 LAB — LACTATE DEHYDROGENASE: LDH: 187 U/L (ref 125–245)

## 2015-08-12 LAB — KAPPA/LAMBDA LIGHT CHAINS
Ig Kappa Free Light Chain: 167.49 mg/L — ABNORMAL HIGH (ref 3.30–19.40)
Ig Lambda Free Light Chain: 200.24 mg/L — ABNORMAL HIGH (ref 5.71–26.30)
KAPPA/LAMBDA FLC RATIO: 0.84 (ref 0.26–1.65)

## 2015-08-15 LAB — MULTIPLE MYELOMA PANEL, SERUM
ALBUMIN/GLOB SERPL: 1.1 (ref 0.7–1.7)
ALPHA 1: 0.3 g/dL (ref 0.0–0.4)
ALPHA2 GLOB SERPL ELPH-MCNC: 0.8 g/dL (ref 0.4–1.0)
Albumin SerPl Elph-Mcnc: 3.4 g/dL (ref 2.9–4.4)
B-GLOBULIN SERPL ELPH-MCNC: 1 g/dL (ref 0.7–1.3)
Gamma Glob SerPl Elph-Mcnc: 1.3 g/dL (ref 0.4–1.8)
Globulin, Total: 3.3 g/dL (ref 2.2–3.9)
IgA, Qn, Serum: 165 mg/dL (ref 64–422)
IgM, Qn, Serum: 88 mg/dL (ref 26–217)
Total Protein: 6.7 g/dL (ref 6.0–8.5)

## 2015-08-15 LAB — BETA 2 MICROGLOBULIN, SERUM: BETA 2: 23.3 mg/L — AB (ref 0.6–2.4)

## 2015-08-16 ENCOUNTER — Ambulatory Visit (INDEPENDENT_AMBULATORY_CARE_PROVIDER_SITE_OTHER): Payer: Medicare Other | Admitting: Podiatry

## 2015-08-16 ENCOUNTER — Encounter: Payer: Self-pay | Admitting: Podiatry

## 2015-08-16 DIAGNOSIS — B351 Tinea unguium: Secondary | ICD-10-CM

## 2015-08-16 DIAGNOSIS — E1142 Type 2 diabetes mellitus with diabetic polyneuropathy: Secondary | ICD-10-CM

## 2015-08-16 DIAGNOSIS — L600 Ingrowing nail: Secondary | ICD-10-CM

## 2015-08-16 DIAGNOSIS — M79676 Pain in unspecified toe(s): Secondary | ICD-10-CM

## 2015-08-16 NOTE — Progress Notes (Signed)
She presents today with a history of diabetes mellitus and diabetic peripheral neuropathy with a chief complaint of painful elongated toenails.  Objective: Vital signs are stable she is alert and oriented 3 pulses are palpable. Her toenails are thick yellow dystrophic onychomycotic and painful on palpation.  Assessment: Hammertoe deformity. Pain in limb secondary to onychomycosis. Diabetic peripheral neuropathy.  Plan: Initiated paperwork today for diabetic shoes. Debrided nails 1 through 5 bilateral. Follow up with her in a few weeks.

## 2015-08-18 ENCOUNTER — Encounter: Payer: Self-pay | Admitting: Internal Medicine

## 2015-08-18 ENCOUNTER — Ambulatory Visit (HOSPITAL_BASED_OUTPATIENT_CLINIC_OR_DEPARTMENT_OTHER): Payer: Medicare Other | Admitting: Internal Medicine

## 2015-08-18 ENCOUNTER — Telehealth: Payer: Self-pay | Admitting: Internal Medicine

## 2015-08-18 VITALS — BP 118/80 | HR 71 | Temp 97.6°F | Resp 17 | Ht 65.0 in | Wt 203.3 lb

## 2015-08-18 DIAGNOSIS — C9000 Multiple myeloma not having achieved remission: Secondary | ICD-10-CM

## 2015-08-18 DIAGNOSIS — N186 End stage renal disease: Secondary | ICD-10-CM | POA: Diagnosis not present

## 2015-08-18 DIAGNOSIS — Z992 Dependence on renal dialysis: Secondary | ICD-10-CM | POA: Diagnosis not present

## 2015-08-18 DIAGNOSIS — D631 Anemia in chronic kidney disease: Secondary | ICD-10-CM | POA: Diagnosis not present

## 2015-08-18 DIAGNOSIS — D472 Monoclonal gammopathy: Secondary | ICD-10-CM

## 2015-08-18 NOTE — Telephone Encounter (Signed)
Gave adn printed appt sched and avs for pt for June  °

## 2015-08-18 NOTE — Progress Notes (Signed)
[place] Telephone:([phone]   [phone]  OFFICE PROGRESS NOTE  Julia Brazil, MD [place] [address]  DIAGNOSIS:  #1 plasma cell dyscrasia, multiple myeloma.  #2 anemia of chronic disease secondary to chronic kidney disease.   PRIOR THERAPY: Treatment with single agent subcutaneous Velcade 1.3 mg/m2 on a weekly basis with Decadron 40 mg by mouth weekly, status post 52 cycles. Last dose was given on 07/27/2014.  CURRENT THERAPY: Observation.   INTERVAL HISTORY: [name] 82 y.o. female returns to the clinic today for followup visit accompanied by her daughter. The patient continues her hemodialysis on Monday, Wednesday and Friday. She is currently on observation for the last 4 months. She is feeling fine today with no specific complaints. She denied having any fever or chills, no nausea or vomiting. The patient denied having any significant chest pain, shortness of breath, cough or hemoptysis. She has no significant weight loss or night sweats. She had repeat myeloma panel and she is here today for evaluation and discussion of her lab results.  MEDICAL HISTORY: Past Medical History  Diagnosis Date  . Hypertension   . Asthma   . GERD (gastroesophageal reflux disease)   . Neuropathy in diabetes ([place])     Hx: of  . COPD (chronic obstructive pulmonary disease) ([place])   . Anemia   . High cholesterol   . Pulmonary embolism ([place]) 1987  . History of blood transfusion 1987; 2011    "related to lung OR; HgB went down to 5"  . Arthritis     "hips, knees, shoulders" (11/10/2013)  . Chronic lower back pain   . Gout   . Multiple myeloma ([place])   . Neuropathy ([place])   . Hypothyroidism   . Type II diabetes mellitus ([place])   . ESRD (end stage renal disease) on dialysis [facility])     "1st treatment 11/09/2013" MWF.  [place] RD  . Kidney stones   . Constipation     ALLERGIES:  is allergic to penicillins cross  reactors.  MEDICATIONS:  Current Outpatient Prescriptions  Medication Sig Dispense Refill  . acyclovir (ZOVIRAX) 400 MG tablet TAKE 1 TABLET(400 MG) BY MOUTH DAILY 30 tablet 0  . albuterol (PROVENTIL HFA;VENTOLIN HFA) 108 (90 BASE) MCG/ACT inhaler Inhale 1 puff into the lungs 2 (two) times daily as needed for wheezing or shortness of breath.    . allopurinol (ZYLOPRIM) 100 MG tablet Take 100 mg by mouth daily with breakfast.     . B-D ULTRAFINE III SHORT PEN 31G X 8 MM MISC   4  . cinacalcet (SENSIPAR) 30 MG tablet Take 30 mg by mouth daily.    . Cyanocobalamin (VITAMIN B-12 PO) Take 1 tablet by mouth daily.    . ergocalciferol (VITAMIN D2) 50000 UNITS capsule Take 50,000 Units by mouth once a week. Take on Thursdays    . gabapentin (NEURONTIN) 100 MG capsule Take 100 mg by mouth 3 (three) times daily.    Marland Kitchen glimepiride (AMARYL) 1 MG tablet Take 1 mg by mouth daily with breakfast.     . HYDROcodone-acetaminophen (NORCO) 10-325 MG per tablet TK 1 T PO  Q 6 H PRN P  0  . hydrOXYzine (ATARAX/VISTARIL) 25 MG tablet Take 25 mg by mouth every 12 (twelve) hours as needed for itching.   2  . insulin aspart (NOVOLOG) 100 UNIT/ML injection Inject 5 Units into the skin daily as needed for high blood sugar (if  blood sugar is over 200).    Ernest Mallick FLEXTOUCH 100 UNIT/ML Pen Inject 12 Units into the skin daily as needed (high blood sugar). If Blood sugar is above 110 pt will take  5  . levothyroxine (SYNTHROID, LEVOTHROID) 100 MCG tablet Take 100 mcg by mouth daily.  4  . lidocaine-prilocaine (EMLA) cream Apply 1 application topically daily as needed (for numbing).     . multivitamin (RENA-VIT) TABS tablet Take 1 tablet by mouth daily.    . ONE TOUCH ULTRA TEST test strip     . Oxycodone HCl 20 MG TABS Take 20 mg by mouth every 12 (twelve) hours.     . pantoprazole (PROTONIX) 40 MG tablet Take 40 mg by mouth 2 (two) times daily.     . saxagliptin HCl (ONGLYZA) 2.5 MG TABS tablet Take 2.5 mg by mouth daily  with breakfast.     . tiZANidine (ZANAFLEX) 4 MG tablet Take 4 mg by mouth 2 (two) times daily as needed for muscle spasms.     Marland Kitchen dexamethasone (DECADRON) 4 MG tablet TAKE 10 TABLETS(40 MG) BY MOUTH 1 TIME A WEEK (Patient not taking: Reported on 06/13/2015) 40 tablet 0  . ondansetron (ZOFRAN) 8 MG tablet Take 1 tablet (8 mg total) by mouth every 8 (eight) hours as needed for nausea or vomiting. (Patient not taking: Reported on 04/07/2015) 20 tablet 1   No current facility-administered medications for this visit.    SURGICAL HISTORY:  Past Surgical History  Procedure Laterality Date  . Lung removal, partial Left 1987    "blood clot"  . Cholecystectomy    . Av fistula placement Left 2011    "lower arm; never used"  . Colonoscopy    . Abdominoplasty  ~ 1983    "tummy tuck"  . Abdominal hysterectomy  1964  . Av fistula placement Left 05/15/2013    Procedure: INSERTION OF ARTERIOVENOUS (AV) GORE-TEX GRAFT ARM-LEFT UPPER ARM;  Surgeon: Mal Misty, MD;  Location: [place];  Service: Vascular;  Laterality: Left;  . Appendectomy  1954  . Umbilical hernia repair  ~ 1983  . Tubal ligation  1964  . Cystoscopy w/ stone manipulation  1980's  . Hernia repair      umbicical hernia  . Eye surgery Bilateral 2016    CAtaract-   . Av fistula placement Right 10/19/2014    Procedure: INSERTION OF ARTERIOVENOUS (AV) 4-53m x 45cm GORE-TEX GRAFT RIGHT FOREARM;  Surgeon: CAngelia Mould MD;  [place]  Service: Vascular;  Laterality: Right;  . Av fistula placement Right 02/15/2015    Procedure: INSERTION OF RIGHT ARTERIOVENOUS (AV) GORE-TEX GRAFT ARM;  Surgeon: CAngelia Mould MD;  [facility];  Service: Vascular;  Laterality: Right;  . Thrombectomy w/ embolectomy Right 02/18/2015    Procedure: THROMBECTOMY RIGHT UPPER ARM ARTERIOVENOUS GORE-TEX GRAFT with revision of arterial anastamosis;  Surgeon: JMal Misty MD;  [facility];  Service: Vascular;  Laterality: Right;     REVIEW OF SYSTEMS:  A comprehensive review of systems was negative except for: Constitutional: positive for fatigue   PHYSICAL EXAMINATION: General appearance: alert, cooperative, fatigued and no distress Head: Normocephalic, without obvious abnormality, atraumatic Neck: no adenopathy Lymph nodes: Cervical, supraclavicular, and axillary nodes normal. Resp: clear to auscultation bilaterally Back: symmetric, no curvature. ROM normal. No CVA tenderness. Cardio: regular rate and rhythm, S1, S2 normal, no murmur, click, rub or gallop GI: soft, non-tender; bowel sounds normal; no masses,  no organomegaly Extremities:  extremities normal, atraumatic, no cyanosis or edema Neurologic: Alert and oriented X 3, normal strength and tone. Normal symmetric reflexes. Normal coordination and gait  ECOG PERFORMANCE STATUS: 2 - Symptomatic, <50% confined to bed  Blood pressure 118/80, pulse 71, temperature 97.6 F (36.4 C), temperature source Oral, resp. rate 17, height '5\' 5"'$  (1.651 m), weight 203 lb 4.8 oz (92.216 kg), SpO2 100 %.  LABORATORY DATA: Lab Results  Component Value Date   WBC 6.4 08/11/2015   HGB 11.5* 08/11/2015   HCT 35.5 08/11/2015   MCV 94.7 08/11/2015   PLT 236 08/11/2015      Chemistry      Component Value Date/Time   NA 138 08/11/2015 0819   NA 135 02/18/2015 0625   K 3.6 08/11/2015 0819   K 4.8 02/18/2015 0625   CL 95* 01/23/2015 1200   CL 98 09/17/2012 1242   CO2 27 08/11/2015 0819   CO2 24 01/23/2015 1200   BUN 27.9* 08/11/2015 0819   BUN 68* 01/23/2015 1200   CREATININE 5.8* 08/11/2015 0819   CREATININE 9.86* 01/23/2015 1200      Component Value Date/Time   CALCIUM 8.5 08/11/2015 0819   CALCIUM 8.3* 01/23/2015 1200   CALCIUM 8.6 12/02/2012 0933   ALKPHOS 116 08/11/2015 0819   ALKPHOS 98 01/23/2015 1200   AST 16 08/11/2015 0819   AST 27 01/23/2015 1200   ALT 11 08/11/2015 0819   ALT 17 01/23/2015 1200   BILITOT 0.54 08/11/2015 0819   BILITOT 0.5  01/23/2015 1200     Other lab results:   ASSESSMENT: This is a very pleasant 82 years old [place] American female with plasma cell dyscrasia completed treatment with weekly subcutaneous Velcade and Decadron status post 52 weekly doses and tolerating it fairly well. The patient is feeling fine today except for mild fatigue mainly after dialysis. Her myeloma panel showed further mild increase in the free Light chains with a stable kappa/lambda ratio. I discussed the lab result with the patient and her daughter today. I recommended for her to continue on observation for now. I will see her back for follow-up visit in 3 months for reevaluation after repeating myeloma panel. For the end stage renal disease, the patient will continue hemodialysis by nephrology. She was advised to call immediately if she has any concerning symptoms in the interval.  Disclaimer: This note was dictated with voice recognition software. Similar sounding words can inadvertently be transcribed and may not be corrected upon review.

## 2015-11-10 ENCOUNTER — Other Ambulatory Visit (HOSPITAL_BASED_OUTPATIENT_CLINIC_OR_DEPARTMENT_OTHER): Payer: Medicare Other

## 2015-11-10 DIAGNOSIS — C9000 Multiple myeloma not having achieved remission: Secondary | ICD-10-CM | POA: Diagnosis not present

## 2015-11-10 DIAGNOSIS — D472 Monoclonal gammopathy: Secondary | ICD-10-CM

## 2015-11-10 LAB — COMPREHENSIVE METABOLIC PANEL
ALT: 13 U/L (ref 0–55)
AST: 17 U/L (ref 5–34)
Albumin: 3.5 g/dL (ref 3.5–5.0)
Alkaline Phosphatase: 130 U/L (ref 40–150)
Anion Gap: 15 mEq/L — ABNORMAL HIGH (ref 3–11)
BILIRUBIN TOTAL: 0.48 mg/dL (ref 0.20–1.20)
BUN: 33 mg/dL — ABNORMAL HIGH (ref 7.0–26.0)
CO2: 27 meq/L (ref 22–29)
CREATININE: 6.6 mg/dL — AB (ref 0.6–1.1)
Calcium: 8.5 mg/dL (ref 8.4–10.4)
Chloride: 94 mEq/L — ABNORMAL LOW (ref 98–109)
EGFR: 6 mL/min/{1.73_m2} — ABNORMAL LOW (ref >90)
GLUCOSE: 236 mg/dL — AB (ref 70–140)
Potassium: 3.8 mEq/L (ref 3.5–5.1)
SODIUM: 136 meq/L (ref 136–145)
TOTAL PROTEIN: 7.8 g/dL (ref 6.4–8.3)

## 2015-11-10 LAB — CBC WITH DIFFERENTIAL/PLATELET
BASO%: 1 % (ref 0.0–2.0)
Basophils Absolute: 0.1 10*3/uL (ref 0.0–0.1)
EOS%: 1.9 % (ref 0.0–7.0)
Eosinophils Absolute: 0.1 10*3/uL (ref 0.0–0.5)
HCT: 34.8 % (ref 34.8–46.6)
HGB: 11.3 g/dL — ABNORMAL LOW (ref 11.6–15.9)
LYMPH#: 1 10*3/uL (ref 0.9–3.3)
LYMPH%: 15.1 % (ref 14.0–49.7)
MCH: 30 pg (ref 25.1–34.0)
MCHC: 32.6 g/dL (ref 31.5–36.0)
MCV: 92.3 fL (ref 79.5–101.0)
MONO#: 0.6 10*3/uL (ref 0.1–0.9)
MONO%: 9.1 % (ref 0.0–14.0)
NEUT%: 72.9 % (ref 38.4–76.8)
NEUTROS ABS: 4.9 10*3/uL (ref 1.5–6.5)
Platelets: 201 10*3/uL (ref 145–400)
RBC: 3.77 10*6/uL (ref 3.70–5.45)
RDW: 18 % — ABNORMAL HIGH (ref 11.2–14.5)
WBC: 6.7 10*3/uL (ref 3.9–10.3)

## 2015-11-10 LAB — LACTATE DEHYDROGENASE: LDH: 172 U/L (ref 125–245)

## 2015-11-11 LAB — IGG, IGA, IGM
IgA, Qn, Serum: 158 mg/dL (ref 64–422)
IgG, Qn, Serum: 1424 mg/dL (ref 700–1600)
IgM, Qn, Serum: 118 mg/dL (ref 26–217)

## 2015-11-11 LAB — KAPPA/LAMBDA LIGHT CHAINS
IG LAMBDA FREE LIGHT CHAIN: 253.8 mg/L — AB (ref 5.7–26.3)
Ig Kappa Free Light Chain: 170.5 mg/L — ABNORMAL HIGH (ref 3.3–19.4)
KAPPA/LAMBDA FLC RATIO: 0.67 (ref 0.26–1.65)

## 2015-11-11 LAB — BETA 2 MICROGLOBULIN, SERUM: Beta-2: 25.5 mg/L — ABNORMAL HIGH (ref 0.6–2.4)

## 2015-11-17 ENCOUNTER — Encounter: Payer: Self-pay | Admitting: Internal Medicine

## 2015-11-17 ENCOUNTER — Telehealth: Payer: Self-pay | Admitting: Internal Medicine

## 2015-11-17 ENCOUNTER — Ambulatory Visit (HOSPITAL_BASED_OUTPATIENT_CLINIC_OR_DEPARTMENT_OTHER): Payer: Medicare Other | Admitting: Internal Medicine

## 2015-11-17 VITALS — BP 128/73 | HR 81 | Temp 98.1°F | Resp 18 | Ht 65.0 in | Wt 204.4 lb

## 2015-11-17 DIAGNOSIS — C9000 Multiple myeloma not having achieved remission: Secondary | ICD-10-CM | POA: Diagnosis not present

## 2015-11-17 DIAGNOSIS — N186 End stage renal disease: Secondary | ICD-10-CM

## 2015-11-17 DIAGNOSIS — D631 Anemia in chronic kidney disease: Secondary | ICD-10-CM

## 2015-11-17 DIAGNOSIS — Z992 Dependence on renal dialysis: Secondary | ICD-10-CM

## 2015-11-17 NOTE — Progress Notes (Signed)
[place] [phone]   [phone]  OFFICE PROGRESS NOTE  [name], MD [place] [address]  DIAGNOSIS:  #1 plasma cell dyscrasia, multiple myeloma.  #2 anemia of chronic disease secondary to chronic kidney disease.   PRIOR THERAPY: Treatment with single agent subcutaneous Velcade 1.3 mg/m2 on a weekly basis with Decadron 40 mg by mouth weekly, status post 52 cycles. Last dose was given on 07/27/2014.  CURRENT THERAPY: Observation.   INTERVAL HISTORY: [name] 82 y.o. female returns to the clinic today for followup visit accompanied by her daughter. The patient continues her hemodialysis on Monday, Wednesday and Friday. She is currently on observation for the last 7 months. She is feeling fine today with no specific complaints except for mild peripheral neuropathy. Her dose of Neurontin was increased to 600 mg p.m. and 300 mg a.m. by her primary care physician. She denied having any fever or chills, no nausea or vomiting. The patient denied having any significant chest pain, shortness of breath, cough or hemoptysis. She has no significant weight loss or night sweats. She had repeat myeloma panel and she is here today for evaluation and discussion of her lab results.  MEDICAL HISTORY: Past Medical History  Diagnosis Date  . Hypertension   . Asthma   . GERD (gastroesophageal reflux disease)   . Neuropathy in diabetes ([place])     Hx: of  . COPD (chronic obstructive pulmonary disease) ([place])   . Anemia   . High cholesterol   . Pulmonary embolism ([place]) 1987  . History of blood transfusion 1987; 2011    "related to lung OR; HgB went down to 5"  . Arthritis     "hips, knees, shoulders" (11/10/2013)  . Chronic lower back pain   . Gout   . Multiple myeloma ([place])   . Neuropathy ([place])   . Hypothyroidism   . Type II diabetes mellitus ([place])   . ESRD (end stage renal disease) on dialysis [facility])     "1st  treatment 11/09/2013" MWF.  [address]  . Kidney stones   . Constipation     ALLERGIES:  is allergic to penicillins cross reactors.  MEDICATIONS:  Current Outpatient Prescriptions  Medication Sig Dispense Refill  . albuterol (PROVENTIL HFA;VENTOLIN HFA) 108 (90 BASE) MCG/ACT inhaler Inhale 1 puff into the lungs 2 (two) times daily as needed for wheezing or shortness of breath.    . allopurinol (ZYLOPRIM) 100 MG tablet Take 100 mg by mouth daily with breakfast.     . B-D ULTRAFINE III SHORT PEN 31G X 8 MM MISC   4  . cinacalcet (SENSIPAR) 30 MG tablet Take 30 mg by mouth daily.    . ergocalciferol (VITAMIN D2) 50000 UNITS capsule Take 50,000 Units by mouth once a week. Take on Thursdays    . gabapentin (NEURONTIN) 300 MG capsule Take 300 mg by mouth every 8 (eight) hours.  3  . gabapentin (NEURONTIN) 600 MG tablet Take 600 mg by mouth every 8 (eight) hours as needed.  3  . glimepiride (AMARYL) 1 MG tablet Take 1 mg by mouth daily with breakfast.     . HYDROcodone-acetaminophen (NORCO) 10-325 MG per tablet TK 1 T PO  Q 6 H PRN P  0  . hydrOXYzine (ATARAX/VISTARIL) 25 MG tablet Take 25 mg by mouth every 12 (twelve) hours as needed for itching.   2  . insulin aspart (NOVOLOG) 100 UNIT/ML injection Inject  5 Units into the skin daily as needed for high blood sugar (if blood sugar is over 200).    Ernest Mallick FLEXTOUCH 100 UNIT/ML Pen Inject 12 Units into the skin daily as needed (high blood sugar). If Blood sugar is above 110 pt will take  5  . levothyroxine (SYNTHROID, LEVOTHROID) 100 MCG tablet Take 100 mcg by mouth daily.  4  . lidocaine-prilocaine (EMLA) cream Apply 1 application topically daily as needed (for numbing).     . multivitamin (RENA-VIT) TABS tablet Take 1 tablet by mouth daily.    . ONE TOUCH ULTRA TEST test strip     . Oxycodone HCl 20 MG TABS Take 20 mg by mouth every 12 (twelve) hours.     . pantoprazole (PROTONIX) 40 MG tablet Take 40 mg by mouth 2 (two) times daily.     .  saxagliptin HCl (ONGLYZA) 2.5 MG TABS tablet Take 2.5 mg by mouth daily with breakfast.     . acyclovir (ZOVIRAX) 400 MG tablet TAKE 1 TABLET(400 MG) BY MOUTH DAILY (Patient not taking: Reported on 11/17/2015) 30 tablet 0  . amLODipine (NORVASC) 5 MG tablet Take 1 tablet by mouth daily.  4  . Cyanocobalamin (VITAMIN B-12 PO) Take 1 tablet by mouth daily.    Marland Kitchen dexamethasone (DECADRON) 4 MG tablet TAKE 10 TABLETS(40 MG) BY MOUTH 1 TIME A WEEK (Patient not taking: Reported on 06/13/2015) 40 tablet 0  . ondansetron (ZOFRAN) 8 MG tablet Take 1 tablet (8 mg total) by mouth every 8 (eight) hours as needed for nausea or vomiting. (Patient not taking: Reported on 04/07/2015) 20 tablet 1  . tiZANidine (ZANAFLEX) 4 MG tablet Take 4 mg by mouth 2 (two) times daily as needed for muscle spasms. Reported on 11/17/2015     No current facility-administered medications for this visit.    SURGICAL HISTORY:  Past Surgical History  Procedure Laterality Date  . Lung removal, partial Left 1987    "blood clot"  . Cholecystectomy    . Av fistula placement Left 2011    "lower arm; never used"  . Colonoscopy    . Abdominoplasty  ~ 1983    "tummy tuck"  . Abdominal hysterectomy  1964  . Av fistula placement Left 05/15/2013    Procedure: INSERTION OF ARTERIOVENOUS (AV) GORE-TEX GRAFT ARM-LEFT UPPER ARM;  Surgeon: Mal Misty, MD;  Location: [place];  Service: Vascular;  Laterality: Left;  . Appendectomy  1954  . Umbilical hernia repair  ~ 1983  . Tubal ligation  1964  . Cystoscopy w/ stone manipulation  1980's  . Hernia repair      umbicical hernia  . Eye surgery Bilateral 2016    CAtaract-   . Av fistula placement Right 10/19/2014    Procedure: INSERTION OF ARTERIOVENOUS (AV) 4-86m x 45cm GORE-TEX GRAFT RIGHT FOREARM;  Surgeon: CAngelia Mould MD;  [address] [address]  Service: Vascular;  Laterality: Right;  . Av fistula placement Right 02/15/2015    Procedure: INSERTION OF RIGHT ARTERIOVENOUS (AV) GORE-TEX  GRAFT ARM;  Surgeon: CAngelia Mould MD;  [facility];  Service: Vascular;  Laterality: Right;  . Thrombectomy w/ embolectomy Right 02/18/2015    Procedure: THROMBECTOMY RIGHT UPPER ARM ARTERIOVENOUS GORE-TEX GRAFT with revision of arterial anastamosis;  Surgeon: JMal Misty MD;  [facility];  Service: Vascular;  Laterality: Right;    REVIEW OF SYSTEMS:  A comprehensive review of systems was negative except for: Constitutional: positive for fatigue Neurological: positive  for paresthesia   PHYSICAL EXAMINATION: General appearance: alert, cooperative, fatigued and no distress Head: Normocephalic, without obvious abnormality, atraumatic Neck: no adenopathy Lymph nodes: Cervical, supraclavicular, and axillary nodes normal. Resp: clear to auscultation bilaterally Back: symmetric, no curvature. ROM normal. No CVA tenderness. Cardio: regular rate and rhythm, S1, S2 normal, no murmur, click, rub or gallop GI: soft, non-tender; bowel sounds normal; no masses,  no organomegaly Extremities: extremities normal, atraumatic, no cyanosis or edema Neurologic: Alert and oriented X 3, normal strength and tone. Normal symmetric reflexes. Normal coordination and gait  ECOG PERFORMANCE STATUS: 2 - Symptomatic, <50% confined to bed  Blood pressure 128/73, pulse 81, temperature 98.1 F (36.7 C), temperature source Oral, resp. rate 18, height '5\' 5"'$  (1.651 m), weight 204 lb 6.4 oz (92.715 kg), SpO2 99 %.  LABORATORY DATA: Lab Results  Component Value Date   WBC 6.7 11/10/2015   HGB 11.3* 11/10/2015   HCT 34.8 11/10/2015   MCV 92.3 11/10/2015   PLT 201 11/10/2015      Chemistry      Component Value Date/Time   NA 136 11/10/2015 0955   NA 135 02/18/2015 0625   K 3.8 11/10/2015 0955   K 4.8 02/18/2015 0625   CL 95* 01/23/2015 1200   CL 98 09/17/2012 1242   CO2 27 11/10/2015 0955   CO2 24 01/23/2015 1200   BUN 33.0* 11/10/2015 0955   BUN 68* 01/23/2015 1200   CREATININE 6.6*  11/10/2015 0955   CREATININE 9.86* 01/23/2015 1200      Component Value Date/Time   CALCIUM 8.5 11/10/2015 0955   CALCIUM 8.3* 01/23/2015 1200   CALCIUM 8.6 12/02/2012 0933   ALKPHOS 130 11/10/2015 0955   ALKPHOS 98 01/23/2015 1200   AST 17 11/10/2015 0955   AST 27 01/23/2015 1200   ALT 13 11/10/2015 0955   ALT 17 01/23/2015 1200   BILITOT 0.48 11/10/2015 0955   BILITOT 0.5 01/23/2015 1200     Other lab results:   ASSESSMENT: This is a very pleasant 82 years old [place] American female with plasma cell dyscrasia completed treatment with weekly subcutaneous Velcade and Decadron status post 52 weekly doses and tolerating it fairly well. The patient is feeling fine today except for mild fatigue mainly after dialysis. Her myeloma panel showed mild increase in the free Light chains. I discussed the lab result with the patient and her daughter today.  I recommended for her to continue on observation for now.  I will see her back for follow-up visit in 3 months for reevaluation after repeating myeloma panel. For the end stage renal disease, the patient will continue hemodialysis by nephrology. She was advised to call immediately if she has any concerning symptoms in the interval.  Disclaimer: This note was dictated with voice recognition software. Similar sounding words can inadvertently be transcribed and may not be corrected upon review.

## 2015-11-17 NOTE — Telephone Encounter (Signed)
Gave and printed appt sched and avs for pt for Sept °

## 2016-01-02 ENCOUNTER — Encounter (HOSPITAL_COMMUNITY): Payer: Self-pay | Admitting: *Deleted

## 2016-01-02 DIAGNOSIS — E871 Hypo-osmolality and hyponatremia: Secondary | ICD-10-CM | POA: Diagnosis present

## 2016-01-02 DIAGNOSIS — T82868A Thrombosis of vascular prosthetic devices, implants and grafts, initial encounter: Secondary | ICD-10-CM | POA: Diagnosis not present

## 2016-01-02 DIAGNOSIS — E1122 Type 2 diabetes mellitus with diabetic chronic kidney disease: Secondary | ICD-10-CM | POA: Diagnosis present

## 2016-01-02 DIAGNOSIS — Y658 Other specified misadventures during surgical and medical care: Secondary | ICD-10-CM | POA: Diagnosis not present

## 2016-01-02 DIAGNOSIS — E669 Obesity, unspecified: Secondary | ICD-10-CM | POA: Diagnosis present

## 2016-01-02 DIAGNOSIS — Z86711 Personal history of pulmonary embolism: Secondary | ICD-10-CM

## 2016-01-02 DIAGNOSIS — I469 Cardiac arrest, cause unspecified: Secondary | ICD-10-CM | POA: Diagnosis not present

## 2016-01-02 DIAGNOSIS — J449 Chronic obstructive pulmonary disease, unspecified: Secondary | ICD-10-CM | POA: Diagnosis present

## 2016-01-02 DIAGNOSIS — Z66 Do not resuscitate: Secondary | ICD-10-CM | POA: Diagnosis present

## 2016-01-02 DIAGNOSIS — E8889 Other specified metabolic disorders: Secondary | ICD-10-CM | POA: Diagnosis present

## 2016-01-02 DIAGNOSIS — Z794 Long term (current) use of insulin: Secondary | ICD-10-CM

## 2016-01-02 DIAGNOSIS — N2581 Secondary hyperparathyroidism of renal origin: Secondary | ICD-10-CM | POA: Diagnosis present

## 2016-01-02 DIAGNOSIS — I4901 Ventricular fibrillation: Secondary | ICD-10-CM | POA: Diagnosis not present

## 2016-01-02 DIAGNOSIS — I9751 Accidental puncture and laceration of a circulatory system organ or structure during a circulatory system procedure: Secondary | ICD-10-CM | POA: Diagnosis not present

## 2016-01-02 DIAGNOSIS — Y832 Surgical operation with anastomosis, bypass or graft as the cause of abnormal reaction of the patient, or of later complication, without mention of misadventure at the time of the procedure: Secondary | ICD-10-CM | POA: Diagnosis not present

## 2016-01-02 DIAGNOSIS — Z880 Allergy status to penicillin: Secondary | ICD-10-CM

## 2016-01-02 DIAGNOSIS — E039 Hypothyroidism, unspecified: Secondary | ICD-10-CM | POA: Diagnosis present

## 2016-01-02 DIAGNOSIS — M545 Low back pain: Secondary | ICD-10-CM | POA: Diagnosis present

## 2016-01-02 DIAGNOSIS — G92 Toxic encephalopathy: Secondary | ICD-10-CM | POA: Diagnosis not present

## 2016-01-02 DIAGNOSIS — C9001 Multiple myeloma in remission: Secondary | ICD-10-CM | POA: Diagnosis present

## 2016-01-02 DIAGNOSIS — Z833 Family history of diabetes mellitus: Secondary | ICD-10-CM

## 2016-01-02 DIAGNOSIS — E875 Hyperkalemia: Secondary | ICD-10-CM | POA: Diagnosis not present

## 2016-01-02 DIAGNOSIS — E1165 Type 2 diabetes mellitus with hyperglycemia: Secondary | ICD-10-CM | POA: Diagnosis present

## 2016-01-02 DIAGNOSIS — E278 Other specified disorders of adrenal gland: Secondary | ICD-10-CM | POA: Diagnosis present

## 2016-01-02 DIAGNOSIS — Z6834 Body mass index (BMI) 34.0-34.9, adult: Secondary | ICD-10-CM

## 2016-01-02 DIAGNOSIS — Z9049 Acquired absence of other specified parts of digestive tract: Secondary | ICD-10-CM

## 2016-01-02 DIAGNOSIS — R1030 Lower abdominal pain, unspecified: Secondary | ICD-10-CM | POA: Diagnosis not present

## 2016-01-02 DIAGNOSIS — E785 Hyperlipidemia, unspecified: Secondary | ICD-10-CM | POA: Diagnosis present

## 2016-01-02 DIAGNOSIS — R5381 Other malaise: Secondary | ICD-10-CM | POA: Diagnosis not present

## 2016-01-02 DIAGNOSIS — Z79891 Long term (current) use of opiate analgesic: Secondary | ICD-10-CM

## 2016-01-02 DIAGNOSIS — D631 Anemia in chronic kidney disease: Secondary | ICD-10-CM | POA: Diagnosis present

## 2016-01-02 DIAGNOSIS — Z821 Family history of blindness and visual loss: Secondary | ICD-10-CM

## 2016-01-02 DIAGNOSIS — K5909 Other constipation: Secondary | ICD-10-CM | POA: Diagnosis present

## 2016-01-02 DIAGNOSIS — D72829 Elevated white blood cell count, unspecified: Secondary | ICD-10-CM | POA: Diagnosis present

## 2016-01-02 DIAGNOSIS — Z90710 Acquired absence of both cervix and uterus: Secondary | ICD-10-CM

## 2016-01-02 DIAGNOSIS — M109 Gout, unspecified: Secondary | ICD-10-CM | POA: Diagnosis present

## 2016-01-02 DIAGNOSIS — K5780 Diverticulitis of intestine, part unspecified, with perforation and abscess without bleeding: Principal | ICD-10-CM | POA: Diagnosis present

## 2016-01-02 DIAGNOSIS — Z7952 Long term (current) use of systemic steroids: Secondary | ICD-10-CM

## 2016-01-02 DIAGNOSIS — L89302 Pressure ulcer of unspecified buttock, stage 2: Secondary | ICD-10-CM | POA: Diagnosis not present

## 2016-01-02 DIAGNOSIS — N186 End stage renal disease: Secondary | ICD-10-CM | POA: Diagnosis present

## 2016-01-02 DIAGNOSIS — Z79899 Other long term (current) drug therapy: Secondary | ICD-10-CM

## 2016-01-02 DIAGNOSIS — K219 Gastro-esophageal reflux disease without esophagitis: Secondary | ICD-10-CM | POA: Diagnosis present

## 2016-01-02 DIAGNOSIS — Z992 Dependence on renal dialysis: Secondary | ICD-10-CM

## 2016-01-02 DIAGNOSIS — E1140 Type 2 diabetes mellitus with diabetic neuropathy, unspecified: Secondary | ICD-10-CM | POA: Diagnosis present

## 2016-01-02 DIAGNOSIS — T402X5A Adverse effect of other opioids, initial encounter: Secondary | ICD-10-CM | POA: Diagnosis not present

## 2016-01-02 DIAGNOSIS — I120 Hypertensive chronic kidney disease with stage 5 chronic kidney disease or end stage renal disease: Secondary | ICD-10-CM | POA: Diagnosis present

## 2016-01-02 DIAGNOSIS — G8929 Other chronic pain: Secondary | ICD-10-CM | POA: Diagnosis present

## 2016-01-02 DIAGNOSIS — Z841 Family history of disorders of kidney and ureter: Secondary | ICD-10-CM

## 2016-01-02 DIAGNOSIS — D7389 Other diseases of spleen: Secondary | ICD-10-CM | POA: Diagnosis present

## 2016-01-02 LAB — COMPREHENSIVE METABOLIC PANEL
ALT: 11 U/L — ABNORMAL LOW (ref 14–54)
ANION GAP: 15 (ref 5–15)
AST: 15 U/L (ref 15–41)
Albumin: 3.2 g/dL — ABNORMAL LOW (ref 3.5–5.0)
Alkaline Phosphatase: 118 U/L (ref 38–126)
BILIRUBIN TOTAL: 0.2 mg/dL — AB (ref 0.3–1.2)
BUN: 25 mg/dL — AB (ref 6–20)
CHLORIDE: 94 mmol/L — AB (ref 101–111)
CO2: 28 mmol/L (ref 22–32)
Calcium: 8.4 mg/dL — ABNORMAL LOW (ref 8.9–10.3)
Creatinine, Ser: 6.18 mg/dL — ABNORMAL HIGH (ref 0.44–1.00)
GFR, EST AFRICAN AMERICAN: 7 mL/min — AB (ref >60)
GFR, EST NON AFRICAN AMERICAN: 6 mL/min — AB (ref >60)
Glucose, Bld: 110 mg/dL — ABNORMAL HIGH (ref 65–99)
POTASSIUM: 4.1 mmol/L (ref 3.5–5.1)
Sodium: 137 mmol/L (ref 135–145)
TOTAL PROTEIN: 7.8 g/dL (ref 6.5–8.1)

## 2016-01-02 LAB — CBC
HEMATOCRIT: 34.1 % — AB (ref 36.0–46.0)
Hemoglobin: 10.9 g/dL — ABNORMAL LOW (ref 12.0–15.0)
MCH: 30.3 pg (ref 26.0–34.0)
MCHC: 32 g/dL (ref 30.0–36.0)
MCV: 94.7 fL (ref 78.0–100.0)
Platelets: 339 10*3/uL (ref 150–400)
RBC: 3.6 MIL/uL — AB (ref 3.87–5.11)
RDW: 16.1 % — AB (ref 11.5–15.5)
WBC: 15.1 10*3/uL — AB (ref 4.0–10.5)

## 2016-01-02 LAB — LIPASE, BLOOD: LIPASE: 16 U/L (ref 11–51)

## 2016-01-02 NOTE — ED Triage Notes (Signed)
Unable to get bps in both arms fistulas in both arms  Needs lkeg bp when she can lie down..  She also has a dialysis catheter lt chest.  Dialyzed today

## 2016-01-02 NOTE — ED Triage Notes (Signed)
The pt is c/o abd pain for one week  No n or v  No d  Hx of the same

## 2016-01-03 ENCOUNTER — Inpatient Hospital Stay (HOSPITAL_COMMUNITY)
Admission: EM | Admit: 2016-01-03 | Discharge: 2016-02-03 | DRG: 981 | Disposition: E | Payer: Medicare Other | Attending: Family Medicine | Admitting: Family Medicine

## 2016-01-03 ENCOUNTER — Emergency Department (HOSPITAL_COMMUNITY): Payer: Medicare Other

## 2016-01-03 ENCOUNTER — Encounter (HOSPITAL_COMMUNITY): Payer: Self-pay

## 2016-01-03 DIAGNOSIS — G8929 Other chronic pain: Secondary | ICD-10-CM | POA: Diagnosis present

## 2016-01-03 DIAGNOSIS — E118 Type 2 diabetes mellitus with unspecified complications: Secondary | ICD-10-CM | POA: Diagnosis not present

## 2016-01-03 DIAGNOSIS — E119 Type 2 diabetes mellitus without complications: Secondary | ICD-10-CM

## 2016-01-03 DIAGNOSIS — C9000 Multiple myeloma not having achieved remission: Secondary | ICD-10-CM | POA: Diagnosis present

## 2016-01-03 DIAGNOSIS — E279 Disorder of adrenal gland, unspecified: Secondary | ICD-10-CM | POA: Diagnosis not present

## 2016-01-03 DIAGNOSIS — R4701 Aphasia: Secondary | ICD-10-CM

## 2016-01-03 DIAGNOSIS — Z90710 Acquired absence of both cervix and uterus: Secondary | ICD-10-CM | POA: Diagnosis not present

## 2016-01-03 DIAGNOSIS — R4182 Altered mental status, unspecified: Secondary | ICD-10-CM

## 2016-01-03 DIAGNOSIS — D739 Disease of spleen, unspecified: Secondary | ICD-10-CM

## 2016-01-03 DIAGNOSIS — E871 Hypo-osmolality and hyponatremia: Secondary | ICD-10-CM | POA: Diagnosis present

## 2016-01-03 DIAGNOSIS — T82868A Thrombosis of vascular prosthetic devices, implants and grafts, initial encounter: Secondary | ICD-10-CM | POA: Diagnosis not present

## 2016-01-03 DIAGNOSIS — K5720 Diverticulitis of large intestine with perforation and abscess without bleeding: Secondary | ICD-10-CM | POA: Diagnosis present

## 2016-01-03 DIAGNOSIS — N186 End stage renal disease: Secondary | ICD-10-CM

## 2016-01-03 DIAGNOSIS — E875 Hyperkalemia: Secondary | ICD-10-CM

## 2016-01-03 DIAGNOSIS — E1165 Type 2 diabetes mellitus with hyperglycemia: Secondary | ICD-10-CM | POA: Diagnosis present

## 2016-01-03 DIAGNOSIS — Z880 Allergy status to penicillin: Secondary | ICD-10-CM | POA: Diagnosis not present

## 2016-01-03 DIAGNOSIS — L89302 Pressure ulcer of unspecified buttock, stage 2: Secondary | ICD-10-CM | POA: Diagnosis not present

## 2016-01-03 DIAGNOSIS — I10 Essential (primary) hypertension: Secondary | ICD-10-CM | POA: Diagnosis present

## 2016-01-03 DIAGNOSIS — K5700 Diverticulitis of small intestine with perforation and abscess without bleeding: Secondary | ICD-10-CM

## 2016-01-03 DIAGNOSIS — Z821 Family history of blindness and visual loss: Secondary | ICD-10-CM | POA: Diagnosis not present

## 2016-01-03 DIAGNOSIS — N2581 Secondary hyperparathyroidism of renal origin: Secondary | ICD-10-CM | POA: Diagnosis present

## 2016-01-03 DIAGNOSIS — Y832 Surgical operation with anastomosis, bypass or graft as the cause of abnormal reaction of the patient, or of later complication, without mention of misadventure at the time of the procedure: Secondary | ICD-10-CM | POA: Diagnosis not present

## 2016-01-03 DIAGNOSIS — C9001 Multiple myeloma in remission: Secondary | ICD-10-CM | POA: Diagnosis present

## 2016-01-03 DIAGNOSIS — Z9049 Acquired absence of other specified parts of digestive tract: Secondary | ICD-10-CM | POA: Diagnosis not present

## 2016-01-03 DIAGNOSIS — E278 Other specified disorders of adrenal gland: Secondary | ICD-10-CM | POA: Diagnosis present

## 2016-01-03 DIAGNOSIS — G92 Toxic encephalopathy: Secondary | ICD-10-CM | POA: Diagnosis not present

## 2016-01-03 DIAGNOSIS — D649 Anemia, unspecified: Secondary | ICD-10-CM | POA: Diagnosis present

## 2016-01-03 DIAGNOSIS — K5780 Diverticulitis of intestine, part unspecified, with perforation and abscess without bleeding: Secondary | ICD-10-CM | POA: Diagnosis present

## 2016-01-03 DIAGNOSIS — E8889 Other specified metabolic disorders: Secondary | ICD-10-CM | POA: Diagnosis present

## 2016-01-03 DIAGNOSIS — E039 Hypothyroidism, unspecified: Secondary | ICD-10-CM | POA: Diagnosis present

## 2016-01-03 DIAGNOSIS — Z794 Long term (current) use of insulin: Secondary | ICD-10-CM | POA: Diagnosis not present

## 2016-01-03 DIAGNOSIS — I9751 Accidental puncture and laceration of a circulatory system organ or structure during a circulatory system procedure: Secondary | ICD-10-CM | POA: Diagnosis not present

## 2016-01-03 DIAGNOSIS — M545 Low back pain: Secondary | ICD-10-CM | POA: Diagnosis present

## 2016-01-03 DIAGNOSIS — Y658 Other specified misadventures during surgical and medical care: Secondary | ICD-10-CM | POA: Diagnosis not present

## 2016-01-03 DIAGNOSIS — D7389 Other diseases of spleen: Secondary | ICD-10-CM | POA: Diagnosis present

## 2016-01-03 DIAGNOSIS — L8990 Pressure ulcer of unspecified site, unspecified stage: Secondary | ICD-10-CM | POA: Insufficient documentation

## 2016-01-03 DIAGNOSIS — K219 Gastro-esophageal reflux disease without esophagitis: Secondary | ICD-10-CM | POA: Diagnosis present

## 2016-01-03 DIAGNOSIS — R1030 Lower abdominal pain, unspecified: Secondary | ICD-10-CM | POA: Diagnosis present

## 2016-01-03 DIAGNOSIS — I469 Cardiac arrest, cause unspecified: Secondary | ICD-10-CM | POA: Diagnosis not present

## 2016-01-03 DIAGNOSIS — R410 Disorientation, unspecified: Secondary | ICD-10-CM

## 2016-01-03 DIAGNOSIS — E1122 Type 2 diabetes mellitus with diabetic chronic kidney disease: Secondary | ICD-10-CM | POA: Diagnosis present

## 2016-01-03 DIAGNOSIS — T8249XA Other complication of vascular dialysis catheter, initial encounter: Secondary | ICD-10-CM

## 2016-01-03 DIAGNOSIS — J449 Chronic obstructive pulmonary disease, unspecified: Secondary | ICD-10-CM | POA: Diagnosis present

## 2016-01-03 DIAGNOSIS — I120 Hypertensive chronic kidney disease with stage 5 chronic kidney disease or end stage renal disease: Secondary | ICD-10-CM | POA: Diagnosis present

## 2016-01-03 DIAGNOSIS — E1140 Type 2 diabetes mellitus with diabetic neuropathy, unspecified: Secondary | ICD-10-CM | POA: Diagnosis present

## 2016-01-03 LAB — GLUCOSE, CAPILLARY
GLUCOSE-CAPILLARY: 83 mg/dL (ref 65–99)
Glucose-Capillary: 102 mg/dL — ABNORMAL HIGH (ref 65–99)
Glucose-Capillary: 65 mg/dL (ref 65–99)
Glucose-Capillary: 137 mg/dL — ABNORMAL HIGH (ref 65–99)
Glucose-Capillary: 92 mg/dL (ref 65–99)

## 2016-01-03 LAB — CBG MONITORING, ED: GLUCOSE-CAPILLARY: 82 mg/dL (ref 65–99)

## 2016-01-03 LAB — I-STAT CG4 LACTIC ACID, ED: LACTIC ACID, VENOUS: 0.83 mmol/L (ref 0.5–1.9)

## 2016-01-03 MED ORDER — DEXTROSE 50 % IV SOLN
25.0000 mL | Freq: Once | INTRAVENOUS | Status: AC
  Administered 2016-01-03: 25 mL via INTRAVENOUS
  Filled 2016-01-03: qty 50

## 2016-01-03 MED ORDER — INSULIN ASPART 100 UNIT/ML ~~LOC~~ SOLN
0.0000 [IU] | SUBCUTANEOUS | Status: DC
  Administered 2016-01-05: 2 [IU] via SUBCUTANEOUS
  Administered 2016-01-05 – 2016-01-07 (×5): 1 [IU] via SUBCUTANEOUS
  Administered 2016-01-07: 3 [IU] via SUBCUTANEOUS
  Administered 2016-01-08: 1 [IU] via SUBCUTANEOUS
  Administered 2016-01-08: 3 [IU] via SUBCUTANEOUS
  Administered 2016-01-08 (×2): 1 [IU] via SUBCUTANEOUS
  Administered 2016-01-09: 3 [IU] via SUBCUTANEOUS
  Administered 2016-01-09 – 2016-01-10 (×3): 2 [IU] via SUBCUTANEOUS

## 2016-01-03 MED ORDER — SODIUM CHLORIDE 0.9% FLUSH: 9.0000 mL | INTRAVENOUS | Status: DC | PRN

## 2016-01-03 MED ORDER — ONDANSETRON HCL 4 MG/2ML IJ SOLN: 4.0000 mg | Freq: Four times a day (QID) | INTRAMUSCULAR | Status: DC | PRN

## 2016-01-03 MED ORDER — METRONIDAZOLE IN NACL 5-0.79 MG/ML-% IV SOLN
500.0000 mg | Freq: Three times a day (TID) | INTRAVENOUS | Status: DC
  Administered 2016-01-03 – 2016-01-11 (×24): 500 mg via INTRAVENOUS
  Filled 2016-01-03 (×27): qty 100

## 2016-01-03 MED ORDER — SODIUM CHLORIDE 0.9 % IV BOLUS (SEPSIS)
500.0000 mL | Freq: Once | INTRAVENOUS | Status: AC
  Administered 2016-01-03: 500 mL via INTRAVENOUS

## 2016-01-03 MED ORDER — VANCOMYCIN HCL IN DEXTROSE 1-5 GM/200ML-% IV SOLN: 1000.0000 mg | INTRAVENOUS | Status: DC

## 2016-01-03 MED ORDER — DEXTROSE-NACL 5-0.45 % IV SOLN
INTRAVENOUS | Status: DC
  Administered 2016-01-03 – 2016-01-06 (×2): via INTRAVENOUS
  Administered 2016-01-08: 1 mL via INTRAVENOUS

## 2016-01-03 MED ORDER — ACETAMINOPHEN 325 MG PO TABS
650.0000 mg | ORAL_TABLET | ORAL | Status: DC | PRN
  Administered 2016-01-03 – 2016-01-05 (×4): 650 mg via ORAL
  Filled 2016-01-03 (×7): qty 2

## 2016-01-03 MED ORDER — DIPHENHYDRAMINE HCL 50 MG/ML IJ SOLN
12.5000 mg | Freq: Four times a day (QID) | INTRAMUSCULAR | Status: DC | PRN
  Administered 2016-01-04: 12.5 mg via INTRAVENOUS
  Filled 2016-01-03: qty 1

## 2016-01-03 MED ORDER — INSULIN DETEMIR 100 UNIT/ML ~~LOC~~ SOLN
5.0000 [IU] | Freq: Every day | SUBCUTANEOUS | Status: DC
  Administered 2016-01-05 – 2016-01-13 (×9): 5 [IU] via SUBCUTANEOUS
  Filled 2016-01-03 (×13): qty 0.05

## 2016-01-03 MED ORDER — ALBUTEROL SULFATE HFA 108 (90 BASE) MCG/ACT IN AERS: 1.0000 | INHALATION_SPRAY | Freq: Two times a day (BID) | RESPIRATORY_TRACT | Status: DC | PRN

## 2016-01-03 MED ORDER — SODIUM CHLORIDE 0.9 % IV SOLN
INTRAVENOUS | Status: DC
Start: 2016-01-03 — End: 2016-01-03

## 2016-01-03 MED ORDER — SODIUM CHLORIDE 0.9 % IV SOLN
INTRAVENOUS | Status: DC
  Administered 2016-01-03: 12:00:00 via INTRAVENOUS

## 2016-01-03 MED ORDER — ONDANSETRON HCL 4 MG/2ML IJ SOLN
4.0000 mg | Freq: Once | INTRAMUSCULAR | Status: AC
  Administered 2016-01-03: 4 mg via INTRAVENOUS
  Filled 2016-01-03: qty 2

## 2016-01-03 MED ORDER — INSULIN DETEMIR 100 UNIT/ML FLEXPEN: 12.0000 [IU] | PEN_INJECTOR | Freq: Every day | SUBCUTANEOUS | Status: DC | PRN

## 2016-01-03 MED ORDER — NALOXONE HCL 0.4 MG/ML IJ SOLN: 0.4000 mg | INTRAMUSCULAR | Status: DC | PRN

## 2016-01-03 MED ORDER — DOXERCALCIFEROL 4 MCG/2ML IV SOLN
4.0000 ug | INTRAVENOUS | Status: DC
  Administered 2016-01-04 – 2016-01-11 (×3): 4 ug via INTRAVENOUS
  Filled 2016-01-03 (×5): qty 2

## 2016-01-03 MED ORDER — HEPARIN SODIUM (PORCINE) 5000 UNIT/ML IJ SOLN
5000.0000 [IU] | Freq: Three times a day (TID) | INTRAMUSCULAR | Status: DC
Start: 2016-01-03 — End: 2016-01-13
  Administered 2016-01-03 – 2016-01-13 (×26): 5000 [IU] via SUBCUTANEOUS
  Filled 2016-01-03 (×25): qty 1

## 2016-01-03 MED ORDER — LEVOTHYROXINE SODIUM 100 MCG PO TABS
100.0000 ug | ORAL_TABLET | Freq: Every day | ORAL | Status: DC
  Administered 2016-01-03 – 2016-01-14 (×10): 100 ug via ORAL
  Filled 2016-01-03 (×9): qty 1

## 2016-01-03 MED ORDER — VANCOMYCIN HCL 10 G IV SOLR
2000.0000 mg | INTRAVENOUS | Status: AC
  Administered 2016-01-03: 2000 mg via INTRAVENOUS
  Filled 2016-01-03: qty 2000

## 2016-01-03 MED ORDER — ACETAMINOPHEN 500 MG PO TABS
1000.0000 mg | ORAL_TABLET | Freq: Once | ORAL | Status: AC
  Administered 2016-01-03: 1000 mg via ORAL
  Filled 2016-01-03: qty 2

## 2016-01-03 MED ORDER — ALBUTEROL SULFATE (2.5 MG/3ML) 0.083% IN NEBU
2.5000 mg | INHALATION_SOLUTION | Freq: Two times a day (BID) | RESPIRATORY_TRACT | Status: DC | PRN
  Filled 2016-01-03: qty 3

## 2016-01-03 MED ORDER — FENTANYL CITRATE (PF) 100 MCG/2ML IJ SOLN
50.0000 ug | Freq: Once | INTRAMUSCULAR | Status: AC
  Administered 2016-01-03: 50 ug via INTRAVENOUS
  Filled 2016-01-03: qty 2

## 2016-01-03 MED ORDER — MORPHINE SULFATE 2 MG/ML IV SOLN
INTRAVENOUS | Status: DC
  Administered 2016-01-03: 0 mg via INTRAVENOUS
  Administered 2016-01-03: 15 mg via INTRAVENOUS
  Administered 2016-01-03: 19.5 mg via INTRAVENOUS
  Administered 2016-01-03: 10:00:00 via INTRAVENOUS
  Administered 2016-01-04: 0 mg via INTRAVENOUS
  Administered 2016-01-04: 1.5 mg via INTRAVENOUS
  Administered 2016-01-04 – 2016-01-05 (×8): 0 mg via INTRAVENOUS
  Administered 2016-01-06: 16.5 mg via INTRAVENOUS
  Administered 2016-01-06: 14:00:00 via INTRAVENOUS
  Administered 2016-01-06: 14 mg via INTRAVENOUS
  Administered 2016-01-07: 1 mg via INTRAVENOUS
  Administered 2016-01-07 (×3): 0 mg via INTRAVENOUS
  Filled 2016-01-03 (×2): qty 25

## 2016-01-03 MED ORDER — GABAPENTIN 300 MG PO CAPS
300.0000 mg | ORAL_CAPSULE | Freq: Three times a day (TID) | ORAL | Status: DC
  Administered 2016-01-03 (×3): 300 mg via ORAL
  Filled 2016-01-03 (×3): qty 1

## 2016-01-03 MED ORDER — DEXTROSE 5 % IV SOLN: 2.0000 g | INTRAVENOUS | Status: DC

## 2016-01-03 MED ORDER — IOPAMIDOL (ISOVUE-300) INJECTION 61%
INTRAVENOUS | Status: AC
  Administered 2016-01-03: 100 mL
  Filled 2016-01-03: qty 100

## 2016-01-03 MED ORDER — CEFEPIME HCL 2 G IJ SOLR
2.0000 g | Freq: Once | INTRAMUSCULAR | Status: AC
  Administered 2016-01-03: 2 g via INTRAVENOUS
  Filled 2016-01-03: qty 2

## 2016-01-03 MED ORDER — MORPHINE SULFATE (PF) 4 MG/ML IV SOLN
4.0000 mg | Freq: Once | INTRAVENOUS | Status: AC
  Administered 2016-01-03: 4 mg via INTRAVENOUS
  Filled 2016-01-03: qty 1

## 2016-01-03 MED ORDER — DIPHENHYDRAMINE HCL 12.5 MG/5ML PO ELIX: 12.5000 mg | ORAL_SOLUTION | Freq: Four times a day (QID) | ORAL | Status: DC | PRN

## 2016-01-03 MED ORDER — CIPROFLOXACIN IN D5W 400 MG/200ML IV SOLN
400.0000 mg | INTRAVENOUS | Status: DC
  Administered 2016-01-03 – 2016-01-10 (×8): 400 mg via INTRAVENOUS
  Filled 2016-01-03 (×9): qty 200

## 2016-01-03 NOTE — Progress Notes (Signed)
Patient has a temperature for 101.4 and a CBG of 65. Patient will receive PRN tylenol and some D50- patient is NPO.

## 2016-01-03 NOTE — ED Notes (Signed)
CBG 82 

## 2016-01-03 NOTE — ED Notes (Addendum)
Patient transported to CT 

## 2016-01-03 NOTE — Progress Notes (Signed)
  Pt admitted to the unit. Pt is stable, alert and oriented per baseline. Oriented to room, staff, and call bell. Educated to call for any assistance. Bed in lowest position, call bell within reach- will continue to monitor. 

## 2016-01-03 NOTE — Progress Notes (Addendum)
Pharmacy Antibiotic Note  [name] is a 82 y.o. female admitted on 02/01/2016 with sepsis.  Pharmacy has been consulted for Vancocin and cefepime dosing.  Plan: Vancomycin 2000mg  IV x1 then 1000mg  after each HD. Goal pre-HD vanc level 15-25. Cefepime 2g IV now and after HD.  Height: 5\' 5"  (165.1 cm) Weight: 206 lb (93.4 kg) IBW/kg (Calculated) : 57  Temp (24hrs), Avg:100.2 F (37.9 C), Min:100.2 F (37.9 C), Max:100.2 F (37.9 C)   Recent Labs Lab 01/02/16 2230  WBC 15.1*  CREATININE 6.18*    Estimated Creatinine Clearance: 8.1 mL/min (by C-G formula based on SCr of 6.18 mg/dL).    Allergies  Allergen Reactions  . Penicillins Cross Reactors Other (See Comments)    Tongue Swelling. Has patient had a PCN reaction causing immediate rash, facial/tongue/throat swelling, SOB or lightheadedness with hypotension: YES Has patient had a PCN reaction causing severe rash involving mucus membranes or skin necrosis: NO Has patient had a PCN reaction that required hospitalization NO Has patient had a PCN reaction occurring within the last 10 years: NO If all of the above answers are "NO", then may proceed with Cephalosporin use.    Thank you for allowing pharmacy to be a part of this patient's care.  Wynona Neat, PharmD, BCPS  01/20/2016 1:46 AM

## 2016-01-03 NOTE — ED Notes (Signed)
Patient transported to X-ray 

## 2016-01-03 NOTE — Progress Notes (Signed)
Patient ID: [name], female   DOB: 1932/10/27, 82 y.o.   MRN: [id number]   Request for consideration of abscess aspiration/drain  Imaging reviewed with Dr Corrie Mckusick Abscess is small and behind bladder No window to safely approach small collection No drain placement for now.  Rec: continued medical therapy; hope for continued improvement Only if symptoms worsen - would consider re CT and possible drain if accessible at that time

## 2016-01-03 NOTE — ED Notes (Signed)
Attempted IV without success.

## 2016-01-03 NOTE — ED Provider Notes (Signed)
By signing my name below, I, Ephriam Jenkins, attest that this documentation has been prepared under the direction and in the presence of Jamul, DO. Electronically signed, Ephriam Jenkins, ED Scribe. 01/13/2016. 2:22 AM.  TIME SEEN: 1:34 AM  CHIEF COMPLAINT: Abdominal Pain  HPI:  HPI Comments: [name] is a 82 y.o. female with a PMHx of multiple myeloma remission 10 months ago, COPD, DM, PE, GERD, ESRD on HD MWF, who presents to the Emergency Department complaining of gradually worsening, lower abdominal pain onset one week ago. Pt's last dialysis treatment was yesterday morning; also states she has not missed a dialysis treatment recently. Has been on dialysis for the past 2-1/2 years. Pt states she has taken antacids with no relief of symptoms. Pt states that she does not make urine anymore; "maybe 1 or 2 drops". Pt reports a surgical Hx of cholecystectomy, tubal ligation, hysterectomy, appendectomy.   Pt denies Hx of Diverticulitis, Diverticulosis. Pt denies Hx of similar pain with UTI. Pt also  Pt denies nausea, vomiting, diarrhea, vaginal bleeding or discharge. States she's recently had a cold, mild dry cough. No chest pain or shortness of breath. Is febrile in the emergency department. Was not aware she had fever at home.  Pt's PCP is Dr. Katherine Roan with [facility]   ROS: See HPI Constitutional:  fever  Eyes: no drainage  ENT: no runny nose   Cardiovascular:  no chest pain  Resp: no SOB  GI: no vomiting GU: no dysuria Integumentary: no rash  Allergy: no hives  Musculoskeletal: no leg swelling  Neurological: no slurred speech ROS otherwise negative  PAST MEDICAL HISTORY/PAST SURGICAL HISTORY:  Past Medical History:  Diagnosis Date  . Anemia   . Arthritis    "hips, knees, shoulders" (11/10/2013)  . Asthma   . Chronic lower back pain   . Constipation   . COPD (chronic obstructive pulmonary disease) ([place])   . ESRD (end stage renal disease) on dialysis [facility])    "1st treatment  11/09/2013" MWF.  [facility]  . GERD (gastroesophageal reflux disease)   . Gout   . High cholesterol   . History of blood transfusion 1987; 2011   "related to lung OR; HgB went down to 5"  . Hypertension   . Hypothyroidism   . Kidney stones   . Multiple myeloma ([place])   . Neuropathy ([place])   . Neuropathy in diabetes ([place])    Hx: of  . Pulmonary embolism ([place]) 1987  . Type II diabetes mellitus ([facility])     MEDICATIONS:  Prior to Admission medications   Medication Sig Start Date End Date Taking? Authorizing Provider  acyclovir (ZOVIRAX) 400 MG tablet TAKE 1 TABLET(400 MG) BY MOUTH DAILY Patient not taking: Reported on 11/17/2015 03/25/15   Curt Bears, MD  albuterol (PROVENTIL HFA;VENTOLIN HFA) 108 (90 BASE) MCG/ACT inhaler Inhale 1 puff into the lungs 2 (two) times daily as needed for wheezing or shortness of breath.    Historical Provider, MD  allopurinol (ZYLOPRIM) 100 MG tablet Take 100 mg by mouth daily with breakfast.     Historical Provider, MD  amLODipine (NORVASC) 5 MG tablet Take 1 tablet by mouth daily. 08/26/15   Historical Provider, MD  B-D ULTRAFINE III SHORT PEN 31G X 8 MM MISC  07/19/14   Historical Provider, MD  cinacalcet (SENSIPAR) 30 MG tablet Take 30 mg by mouth daily.    Historical Provider, MD  Cyanocobalamin (VITAMIN B-12 PO) Take 1 tablet by mouth daily.  Historical Provider, MD  dexamethasone (DECADRON) 4 MG tablet TAKE 10 TABLETS(40 MG) BY MOUTH 1 TIME A WEEK Patient not taking: Reported on 06/13/2015 03/25/15   Curt Bears, MD  ergocalciferol (VITAMIN D2) 50000 UNITS capsule Take 50,000 Units by mouth once a week. Take on Thursdays    Historical Provider, MD  gabapentin (NEURONTIN) 300 MG capsule Take 300 mg by mouth every 8 (eight) hours. 11/05/15   Historical Provider, MD  gabapentin (NEURONTIN) 600 MG tablet Take 600 mg by mouth every 8 (eight) hours as needed. 11/08/15   Historical Provider, MD  glimepiride (AMARYL) 1 MG tablet Take 1 mg by mouth daily with  breakfast.     Historical Provider, MD  HYDROcodone-acetaminophen (NORCO) 10-325 MG per tablet TK 1 T PO  Q 6 H PRN P 01/11/15   Historical Provider, MD  hydrOXYzine (ATARAX/VISTARIL) 25 MG tablet Take 25 mg by mouth every 12 (twelve) hours as needed for itching.  09/07/14   Historical Provider, MD  insulin aspart (NOVOLOG) 100 UNIT/ML injection Inject 5 Units into the skin daily as needed for high blood sugar (if blood sugar is over 200).    Historical Provider, MD  LEVEMIR FLEXTOUCH 100 UNIT/ML Pen Inject 12 Units into the skin daily as needed (high blood sugar). If Blood sugar is above 110 pt will take 09/15/14   Historical Provider, MD  levothyroxine (SYNTHROID, LEVOTHROID) 100 MCG tablet Take 100 mcg by mouth daily. 09/01/14   Historical Provider, MD  lidocaine-prilocaine (EMLA) cream Apply 1 application topically daily as needed (for numbing).  11/06/13   Historical Provider, MD  multivitamin (RENA-VIT) TABS tablet Take 1 tablet by mouth daily.    Historical Provider, MD  ondansetron (ZOFRAN) 8 MG tablet Take 1 tablet (8 mg total) by mouth every 8 (eight) hours as needed for nausea or vomiting. Patient not taking: Reported on 04/07/2015 08/05/13   Curt Bears, MD  ONE [facility] ULTRA TEST test strip  12/05/13   Historical Provider, MD  Oxycodone HCl 20 MG TABS Take 20 mg by mouth every 12 (twelve) hours.  06/07/13   Historical Provider, MD  pantoprazole (PROTONIX) 40 MG tablet Take 40 mg by mouth 2 (two) times daily.     Historical Provider, MD  saxagliptin HCl (ONGLYZA) 2.5 MG TABS tablet Take 2.5 mg by mouth daily with breakfast.     Historical Provider, MD  tiZANidine (ZANAFLEX) 4 MG tablet Take 4 mg by mouth 2 (two) times daily as needed for muscle spasms. Reported on 11/17/2015 01/11/15   Historical Provider, MD    ALLERGIES:  Allergies  Allergen Reactions  . Penicillins Cross Reactors Other (See Comments)    Tongue Swelling. Has patient had a PCN reaction causing immediate rash, facial/tongue/throat  swelling, SOB or lightheadedness with hypotension: YES Has patient had a PCN reaction causing severe rash involving mucus membranes or skin necrosis: NO Has patient had a PCN reaction that required hospitalization NO Has patient had a PCN reaction occurring within the last 10 years: NO If all of the above answers are "NO", then may proceed with Cephalosporin use.    SOCIAL HISTORY:  Social History  Substance Use Topics  . Smoking status: Never Smoker  . Smokeless tobacco: Never Used  . Alcohol use No    FAMILY HISTORY: Family History  Problem Relation Age of Onset  . Blindness Father   . Diabetes Father   . Diabetes Sister   . Kidney failure Brother     EXAM: BP 95/69  Pulse 82   Temp 100.2 F (37.9 C)   Resp 16   Ht '5\' 5"'$  (1.651 m)   Wt 206 lb (93.4 kg)   SpO2 99%   BMI 34.28 kg/m  CONSTITUTIONAL: Alert and oriented and responds appropriately to questions. Elderly, chronicly ill appearing, febrile but non-toxic. HEAD: Normocephalic  EYES: Conjunctivae clear, PERRL ENT: normal nose; no rhinorrhea; moist mucous membranes NECK: Supple, no meningismus, no LAD  CARD: RRR; S1 and S2 appreciated; no murmurs, no clicks, no rubs, no gallops RESP: Normal chest excursion without splinting or tachypnea; breath sounds clear and equal bilaterally; no wheezes, no rhonchi, no rales, no hypoxia or respiratory distress, speaking full sentences ABD/GI: Diffusely TTP throughout lower abdomen with voluntary guarding. Normal bowel sounds; non-distended; soft, no rebound, no peritoneal signs. BACK:  The back appears normal and is non-tender to palpation, there is no CVA tenderness EXT: Normal ROM in all joints; non-tender to palpation; no edema; normal capillary refill; no cyanosis, no calf tenderness or swelling. Dialysis fistula bilateral upper extremities non tender to palpation without erythema or wamth SKIN: Normal color for age and race; warm; no rash NEURO: Moves all extremities  equally, sensation to light touch intact diffusely, cranial nerves II through XII intact PSYCH: The patient's mood and manner are appropriate. Grooming and personal hygiene are appropriate.  MEDICAL DECISION MAKING: Patient here with fever. She also has a leukocytosis of 15,000. She is a dialysis patient and because she meets SIRS criteria, will give broad-spectrum antibiotics, obtain cultures, lactate.  We'll obtain a chest x-ray and a CT of her abdomen and pelvis. She does not produce urine anymore. I feel she will need admission. We'll give pain medication.  ED PROGRESS: 5:30 AM  Pt's BP has improved with 500 mL IVF bolus.  Lactate normal.  Pt has diverticulitis with a 3 x 4.5 cm diverticular abscess. No perforation. Pain well controlled after one dose of fentanyl. Discussed with medicine for admission.    5:35 AM  Discussed patient's case with hospitalist, Dr. Hal Hope.  Recommend admission to inpatient, stepdown bed.  I will place holding orders per their request. Patient and family (if present) updated with plan. Care transferred to hospitalist service.  I reviewed all nursing notes, vitals, pertinent old records, EKGs, labs, imaging (as available).    EKG Interpretation  Date/Time:  Tuesday January 03 2016 02:10:07 EDT Ventricular Rate:  81 PR Interval:    QRS Duration: 83 QT Interval:  399 QTC Calculation: 464 R Axis:   34 Text Interpretation:  Sinus rhythm Low voltage, precordial leads Abnormal R-wave progression, early transition No significant change since last tracing Confirmed by Tenecia Ignasiak,  DO, Araiyah Cumpton ([id number]) on 01/13/2016 2:20:33 AM       This chart was scribed in my presence and reviewed by me personally.    [place], DO 01/12/2016 [phone]

## 2016-01-03 NOTE — Plan of Care (Signed)
82 year old female with history of ESRD and multiple myeloma presents with fever and abdominal pain. CT shows sigmoid diverticulitis with abscess. Patient's blood pressure is in the low-normal. Patient is being admitted for diverticular abscess.  [name].

## 2016-01-03 NOTE — H&P (Signed)
History and Physical    AYAUNA MCNAY Rice:[id number] DOB: 1932/12/24 DOA: 01/20/2016   PCP: [name], MD   Patient coming from/Resides with: Private residence/lives with daughter  Chief Complaint: Lower abdominal pain 1 week  HPI: [name] is a 82 y.o. female with medical history significant for chronic kidney disease on dialysis, diabetes on insulin, history of PE, chronic low back pain, diabetic neuropathy, dyslipidemia, hypertension and hypothyroidism who presents to the hospital with reports of progressively worsening lower abdominal pain for one week. She has not had any fevers or chills, she denies nausea or vomiting, she did notice increased pain with any type of activity.   ED Course:  Vital signs: MAXIMUM TEMPERATURE 100.7 rectal-118/48-77-14-ra 97% CT abdomen and pelvis with contrast: Hypodense 2 cm lesion of the spleen incompletely characterized but likely representing cyst or hemangioma; 2 cm left adrenal lesion that is highly attenuating/enhancing that is increased in size compared to previous study; extensive sigmoid diverticulosis with muscular hypertrophy with changes consistent with active/acute diverticulitis-there is an ill-defined area measuring 2.8 x 4.5 cm (complex collection with a hazy early organizing wall) extends from the sigmoid colon anteriorly are adjacent to the posterior wall of the urinary bladder wall compatible with diverticular abscess 2 View CXR: Newly linear opacity in the right midlung zone likely subsegmental atelectasis or scarring Lab data: Sodium 137, potassium 4.1, BUN 25, creatinine 6.18, glucose 110, albumin 3.2, total protein 7.8, lactic acid 0.83, white count 15,100 differential not obtained, hemoglobin 10.9, platelets 339,000; blood cultures obtained in the ER Medications and treatments: Fentanyl 50 g IV 1, Tylenol 1000 mg 1, Zofran 4 mg IV 1, vancomycin 2 g IV 1, cefepime 2 g IV 1, normal saline bolus 500 mL IV  1  Review of Systems:  In addition to the HPI above,  No Fever-chills, myalgias or other constitutional symptoms No Headache, changes with Vision or hearing, new weakness, tingling, numbness in any extremity, No problems swallowing food or Liquids, indigestion/reflux No Chest pain, Cough or Shortness of Breath, palpitations, orthopnea or DOE No N/V; no melena or hematochezia, no dark tarry stools-does have issues with chronic constipation No dysuria, hematuria or flank pain No new skin rashes, lesions, masses or bruises, No new joints pains-aches No recent weight gain or loss No polyuria, polydypsia or polyphagia,   Past Medical History:  Diagnosis Date  . Anemia   . Arthritis    "hips, knees, shoulders" (11/10/2013)  . Asthma   . Chronic lower back pain   . Constipation   . COPD (chronic obstructive pulmonary disease) ([place])   . ESRD (end stage renal disease) on dialysis [facility])    "1st treatment 11/09/2013" MWF.  [address]  . GERD (gastroesophageal reflux disease)   . Gout   . High cholesterol   . History of blood transfusion 1987; 2011   "related to lung OR; HgB went down to 5"  . Hypertension   . Hypothyroidism   . Kidney stones   . Multiple myeloma ([place])   . Neuropathy ([place])   . Neuropathy in diabetes ([place])    Hx: of  . Pulmonary embolism ([place]) 1987  . Type II diabetes mellitus ([place])     Past Surgical History:  Procedure Laterality Date  . ABDOMINAL HYSTERECTOMY  1964  . ABDOMINOPLASTY  ~ 1983   "tummy tuck"  . APPENDECTOMY  1954  . AV FISTULA PLACEMENT Left 2011   "lower arm; never used"  . AV FISTULA PLACEMENT Left 05/15/2013  Procedure: INSERTION OF ARTERIOVENOUS (AV) GORE-TEX GRAFT ARM-LEFT UPPER ARM;  Surgeon: Mal Misty, MD;  Location: [place];  Service: Vascular;  Laterality: Left;  . AV FISTULA PLACEMENT Right 10/19/2014   Procedure: INSERTION OF ARTERIOVENOUS (AV) 4-60m x 45cm GORE-TEX GRAFT RIGHT FOREARM;  Surgeon: CAngelia Mould MD;  Location:  [place]  Service: Vascular;  Laterality: Right;  . AV FISTULA PLACEMENT Right 02/15/2015   Procedure: INSERTION OF RIGHT ARTERIOVENOUS (AV) GORE-TEX GRAFT ARM;  Surgeon: CAngelia Mould MD;  [place] [facility]  Service: Vascular;  Laterality: Right;  . CHOLECYSTECTOMY    . COLONOSCOPY    . CYSTOSCOPY W/ STONE MANIPULATION  1980's  . EYE SURGERY Bilateral 2016   [facility]-   . HERNIA REPAIR     umbicical hernia  . LUNG REMOVAL, PARTIAL Left 1987   "blood clot"  . THROMBECTOMY W/ EMBOLECTOMY Right 02/18/2015   Procedure: THROMBECTOMY RIGHT UPPER ARM ARTERIOVENOUS GORE-TEX GRAFT with revision of arterial anastamosis;  Surgeon: JMal Misty MD;  [place] [facility]  Service: Vascular;  Laterality: Right;  . TUBAL LIGATION  1964  . UMBILICAL HERNIA REPAIR  ~ 115   Social History   Social History  . Marital status: Widowed    Spouse name: N/A  . Number of children: N/A  . Years of education: N/A   Occupational History  . Not on file.   Social History Main Topics  . Smoking status: Never Smoker  . Smokeless tobacco: Never Used  . Alcohol use No  . Drug use: No  . Sexual activity: No   Other Topics Concern  . Not on file   Social History Narrative  . No narrative on file    Mobility: Occasionally requires a cane Work history: Retired, no longer drives   Allergies  Allergen Reactions  . Penicillins Cross Reactors Other (See Comments)    Tongue Swelling. Has patient had a PCN reaction causing immediate rash, facial/tongue/throat swelling, SOB or lightheadedness with hypotension: YES Has patient had a PCN reaction causing severe rash involving mucus membranes or skin necrosis: NO Has patient had a PCN reaction that required hospitalization NO Has patient had a PCN reaction occurring within the last 10 years: NO If all of the above answers are "NO", then may proceed with Cephalosporin use.    Family History  Problem Relation Age of Onset  . Blindness Father   .  Diabetes Father   . Diabetes Sister   . Kidney failure Throat cancer, CKD on dialysis  Brother (Deceased) Son (Deceased)      Prior to Admission medications   Medication Sig Start Date End Date Taking? Authorizing Provider  albuterol (PROVENTIL HFA;VENTOLIN HFA) 108 (90 BASE) MCG/ACT inhaler Inhale 1 puff into the lungs 2 (two) times daily as needed for wheezing or shortness of breath.   Yes Historical Provider, MD  allopurinol (ZYLOPRIM) 100 MG tablet Take 100 mg by mouth daily with breakfast.    Yes Historical Provider, MD  amLODipine (NORVASC) 5 MG tablet Take 1 tablet by mouth daily. 08/26/15  Yes Historical Provider, MD  cinacalcet (SENSIPAR) 30 MG tablet Take 30 mg by mouth daily.   Yes Historical Provider, MD  Cyanocobalamin (VITAMIN B-12 PO) Take 1 tablet by mouth daily.   Yes Historical Provider, MD  ergocalciferol (VITAMIN D2) 50000 UNITS capsule Take 50,000 Units by mouth once a week. Take on Thursdays   Yes Historical Provider, MD  gabapentin (NEURONTIN) 300 MG capsule Take 300 mg by  mouth every 8 (eight) hours. 11/05/15  Yes Historical Provider, MD  glimepiride (AMARYL) 1 MG tablet Take 1 mg by mouth daily with breakfast.    Yes Historical Provider, MD  HYDROcodone-acetaminophen (NORCO) 10-325 MG tablet Take 1 tablet by mouth every 6 (six) hours as needed for moderate pain.   Yes Historical Provider, MD  hydrOXYzine (ATARAX/VISTARIL) 25 MG tablet Take 25 mg by mouth every 12 (twelve) hours as needed for itching.  09/07/14  Yes Historical Provider, MD  insulin aspart (NOVOLOG) 100 UNIT/ML injection Inject 5 Units into the skin daily as needed for high blood sugar (if blood sugar is over 200).   Yes Historical Provider, MD  LEVEMIR FLEXTOUCH 100 UNIT/ML Pen Inject 12 Units into the skin daily as needed (high blood sugar). If Blood sugar is above 110 pt will take 09/15/14  Yes Historical Provider, MD  levothyroxine (SYNTHROID, LEVOTHROID) 100 MCG tablet Take 100 mcg by mouth daily. 09/01/14   Yes Historical Provider, MD  multivitamin (RENA-VIT) TABS tablet Take 1 tablet by mouth daily.   Yes Historical Provider, MD  Oxycodone HCl 20 MG TABS Take 20 mg by mouth every 12 (twelve) hours.  06/07/13  Yes Historical Provider, MD  pantoprazole (PROTONIX) 40 MG tablet Take 40 mg by mouth 2 (two) times daily.    Yes Historical Provider, MD  saxagliptin HCl (ONGLYZA) 2.5 MG TABS tablet Take 2.5 mg by mouth daily with breakfast.    Yes Historical Provider, MD  tiZANidine (ZANAFLEX) 4 MG tablet Take 4 mg by mouth 2 (two) times daily as needed for muscle spasms. Reported on 11/17/2015 01/11/15  Yes Historical Provider, MD  acyclovir (ZOVIRAX) 400 MG tablet TAKE 1 TABLET(400 MG) BY MOUTH DAILY Patient not taking: Reported on 11/17/2015 03/25/15   Curt Bears, MD  B-D ULTRAFINE III SHORT PEN 31G X 8 MM MISC  07/19/14   Historical Provider, MD  dexamethasone (DECADRON) 4 MG tablet TAKE 10 TABLETS(40 MG) BY MOUTH 1 TIME A WEEK Patient not taking: Reported on 06/13/2015 03/25/15   Curt Bears, MD  lidocaine-prilocaine (EMLA) cream Apply 1 application topically daily as needed (for numbing).  11/06/13   Historical Provider, MD  ondansetron (ZOFRAN) 8 MG tablet Take 1 tablet (8 mg total) by mouth every 8 (eight) hours as needed for nausea or vomiting. Patient not taking: Reported on 04/07/2015 08/05/13   Curt Bears, MD  ONE [facility] ULTRA TEST test strip  12/05/13   Historical Provider, MD    Physical Exam: Vitals:   01/21/2016 0630 01/06/2016 0700 01/24/2016 0716 01/07/2016 0734  BP: (!) 114/51 109/57  (!) 116/49  Pulse: 69 67  66  Resp: '14 13  12  '$ Temp:   98.1 F (36.7 C)   TempSrc:   Oral   SpO2: 95% 97%  98%  Weight:      Height:          Constitutional: NAD, calm, but uncomfortable with even minimal movement Eyes: PERRL, lids and conjunctivae normal ENMT: Mucous membranes are moist. Posterior pharynx clear of any exudate or lesions.Normal dentition.  Neck: normal, supple, no masses, no  thyromegaly Respiratory: clear to auscultation bilaterally, no wheezing, no crackles. Normal respiratory effort. No accessory muscle use.  Cardiovascular: Regular rate and rhythm, no murmurs / rubs / gallops. No extremity edema. 2+ pedal pulses. No carotid bruits.  Abdomen: Exquisitely tender with guarding and rebounding over suprapubic area and left lower quadrant of abdomen, no masses palpated. No hepatosplenomegaly. Bowel sounds positive hypoactive.  Musculoskeletal:  no clubbing / cyanosis. No joint deformity upper and lower extremities. Good ROM, no contractures. Normal muscle tone.  Skin: no rashes, lesions, ulcers. No induration Neurologic: CN 2-12 grossly intact. Sensation intact, DTR normal. Strength 5/5 x all 4 extremities.  Psychiatric: Normal judgment and insight. Alert and oriented x 3. Normal mood.    Labs on Admission: I have personally reviewed following labs and imaging studies  CBC:  Recent Labs Lab 01/02/16 2230  WBC 15.1*  HGB 10.9*  HCT 34.1*  MCV 94.7  PLT 314   Basic Metabolic Panel:  Recent Labs Lab 01/02/16 2230  NA 137  K 4.1  CL 94*  CO2 28  GLUCOSE 110*  BUN 25*  CREATININE 6.18*  CALCIUM 8.4*   GFR: Estimated Creatinine Clearance: 8.1 mL/min (by C-G formula based on SCr of 6.18 mg/dL). Liver Function Tests:  Recent Labs Lab 01/02/16 2230  AST 15  ALT 11*  ALKPHOS 118  BILITOT 0.2*  PROT 7.8  ALBUMIN 3.2*    Recent Labs Lab 01/02/16 2230  LIPASE 16   No results for input(s): AMMONIA in the last 168 hours. Coagulation Profile: No results for input(s): INR, PROTIME in the last 168 hours. Cardiac Enzymes: No results for input(s): CKTOTAL, CKMB, CKMBINDEX, TROPONINI in the last 168 hours. BNP (last 3 results) No results for input(s): PROBNP in the last 8760 hours. HbA1C: No results for input(s): HGBA1C in the last 72 hours. CBG: No results for input(s): GLUCAP in the last 168 hours. Lipid Profile: No results for input(s):  CHOL, HDL, LDLCALC, TRIG, CHOLHDL, LDLDIRECT in the last 72 hours. Thyroid Function Tests: No results for input(s): TSH, T4TOTAL, FREET4, T3FREE, THYROIDAB in the last 72 hours. Anemia Panel: No results for input(s): VITAMINB12, FOLATE, FERRITIN, TIBC, IRON, RETICCTPCT in the last 72 hours. Urine analysis:    Component Value Date/Time   COLORURINE YELLOW 09/24/2012 [place] 09/24/2012 1121   LABSPEC 1.010 10/14/2013 0948   PHURINE 6.0 10/14/2013 0948   PHURINE 7.0 09/24/2012 1121   GLUCOSEU Negative 10/14/2013 0948   HGBUR Negative 10/14/2013 0948   HGBUR NEGATIVE 09/24/2012 1121   BILIRUBINUR Negative 10/14/2013 0948   KETONESUR Negative 10/14/2013 0948   KETONESUR NEGATIVE 09/24/2012 1121   PROTEINUR < 30 10/14/2013 0948   PROTEINUR NEGATIVE 09/24/2012 1121   UROBILINOGEN 0.2 10/14/2013 0948   NITRITE Negative 10/14/2013 0948   NITRITE NEGATIVE 09/24/2012 1121   LEUKOCYTESUR Moderate 10/14/2013 0948   Sepsis Labs: '@LABRCNTIP'$ (procalcitonin:4,lacticidven:4) )No results found for this or any previous visit (from the past 240 hour(s)).   Radiological Exams on Admission: Dg Chest 2 View  Result Date: 01/24/2016 CLINICAL DATA:  Cough and fever tonight. EXAM: CHEST  2 VIEW COMPARISON:  01/23/2015 FINDINGS: Mild cardiomegaly. There is atherosclerosis of the thoracic aorta, unchanged aortic and mediastinal contours. Stable pleural-parenchymal scarring at the left lung base. New linear opacity in the periphery of the right midlung zone. Chronic bronchial thickening is unchanged. No pleural effusion or pneumothorax. A vascular stent in the left axilla. There is degenerative change throughout spine. IMPRESSION: 1. New linear opacity in the right midlung zone likely subsegmental atelectasis or scarring. Stable pleural parenchymal scarring at the left lung base. 2. Aortic atherosclerosis. Electronically Signed   By: Jeb Levering M.D.   On: 02/02/2016 02:51   Ct Abdomen  Pelvis W Contrast  Result Date: 01/31/2016 CLINICAL DATA:  82 year old female with lower abdominal pain EXAM: CT ABDOMEN AND PELVIS WITH CONTRAST TECHNIQUE: Multidetector CT imaging of  the abdomen and pelvis was performed using the standard protocol following bolus administration of intravenous contrast. CONTRAST:  1 ISOVUE-300 IOPAMIDOL (ISOVUE-300) INJECTION 61% COMPARISON:  CT dated 09/24/2012 FINDINGS: The visualized lung bases are clear. No intra-abdominal free air. Trace free fluid may be present within the pelvis. Cholecystectomy. Mild biliary ductal dilatation, likely post cholecystectomy. The pancreas is unremarkable. There is a 2 cm hypodense lesion in the spleen which is incompletely characterized but may represent a cyst or hemangioma. There is a 2 cm left adrenal high attenuating or enhancing lesion which is increased in size compared to the prior study. MRI without and with contrast is recommended for further characterization. There is severe bilateral renal atrophy. Small nonobstructing bilateral renal calculi measuring up to 4 mm in the upper pole of the left kidney. There is no hydronephrosis on either side. Bilateral renal upper pole exophytic hypodense lesions are not well characterized but likely represent cysts. The visualized ureters and urinary bladder appear unremarkable. Hysterectomy. There is extensive sigmoid diverticulosis with muscular hypertrophy. There is active inflammatory changes of the sigmoid colon compatible with acute diverticulitis. There is an ill-defined 2.8 x 4.5 cm complex collection with hazy organizing wall extending from the sigmoid colon anteriorly adjacent to the posterior wall of the urinary bladder compatible with diverticular abscess formation. There is no evidence of bowel obstruction. Normal appendix. There is moderate aortoiliac atherosclerotic disease. The origins of the celiac axis, SMA, IMA as well as the origins of the renal arteries are patent. No portal  venous gas identified. There is no adenopathy. There is a midline vertical anterior pelvic wall incisional scar with a small fat containing umbilical hernia. The abdominal wall soft tissues are otherwise unremarkable. There is degenerative changes of the spine with multilevel disc desiccation with vacuum phenomena. No acute fracture. IMPRESSION: Sigmoid diverticulitis with developing diverticular abscess. Enhancing left adrenal lesion. MRI without and with contrast is recommended for further characterization. Electronically Signed   By: Elgie Collard M.D.   On: 02/02/2016 05:09    EKG: (Independently reviewed) sinus rhythm with ventricular rate 81 bpm, QTC 464 ms, low-voltage R waves in the septal leads but otherwise normal-appearing EKG  Assessment/Plan Principal Problem:   Diverticular disease of intestine with perforation and abscess -Patient presents with 1 week of progressive significant abdominal pain with CT findings consistent with acute diverticulitis and evolving abscess (ill-defined area measuring 2.8 x 4.5) -NPO except for meds -Consult IR to determine if early abscess appropriate for percutaneous drainage -Empiric Cipro and Flagyl-follow-up on blood cultures -Hemodynamically stable and no evidence of end organ involvement as well as normal lactic acid so does not meet criteria for sepsis -Patient on chronic pain medications so utilize PCA morphine initially for pain management -Tylenol for fever and pain adjunct -Will need eventual GI referral/colonoscopy once acute diverticulitis has resolved  Active Problems:   End stage renal disease  -Dialyzes at [place] arm on MWF -Completed dialysis yesterday without reported complications -Nephrology aware of admission    Hypertension -Current systolic blood pressure mildly suboptimal did require normal saline challenge in the ER so we'll hold preadmission Norvasc    Diabetes mellitus type 2, insulin dependent  -With mild fasting  hyperglycemia -Continue preadmission Lantus but hold Onglyza and glimepiride -Follow CBGs and provide SSI    Hypothyroidism -Continue Synthroid    Chronic low back pain -She is on combination of Neurontin for peripheral neuropathy as well as Zanaflex, long-acting oxycodone 20 mg every 12 hours and Norco prn for breakthrough  pain chronically -PCA morphine initially and evaluate response giving chronicity of long-acting narcotics    Multiple myeloma  -Reported as in remission and current total protein normal    Absolute anemia -Hgb stable and around baseline of 10.8    Left adrenal mass /Splenic lesion -Incidental finding on CT the abdomen and pelvis in the ER with radiologist recommended an MRI to better characterize; etiology is documented that the adrenal mass has increased in size from previous CT scan      DVT prophylaxis: Subcutaneous heparin  Code Status: DO NOT RESUSCITATE  Family Communication: Daughter Mariann Laster at the bedside Disposition Plan: Anticipate discharge back to preadmission home environment once medically stable Consults called: Nephrology/[place]; IR/ [place] Admission status: Inpatient/medical-surgical floor    Eleen Litz L. ANP-BC [facility] Pager [phone]   If 7PM-7AM, please contact night-coverage [email or web address] Password [email or web address]  01/06/2016, 7:55 AM

## 2016-01-03 NOTE — ED Notes (Addendum)
Bryson Ha, Admitting NP at the bedside

## 2016-01-03 NOTE — Progress Notes (Signed)
Hypoglycemic Event  CBG: 65  Treatment: D50 IV 25 mL  Symptoms: None  Follow-up CBG: Time: 5:45pm  CBG Result:137  Possible Reasons for Event: Inadequate meal intake  Comments/MD notified: n/a    [name]

## 2016-01-03 NOTE — Consult Note (Signed)
[facility] Renal Consultation Note  Indication for Consultation:  Management of ESRD/hemodialysis; anemia, hypertension/volume and secondary hyperparathyroidism  HPI: [name] is a 82 y.o. female ESRD  chronic  HD (MWF  [facility]) admitted with  Lower abdominal pain progressing past week ,  ER CT abd. showing acute diverticulitis and evolving abscess / admit team consulting IR  to determine if early abscess appropriate for percutaneous drainage. K 4.1  Wbc 11.5  And CXR  = " Newly linear opacity in the right midlung zone likely subsegmental atelectasis or scarring. " .She is compliant with her HD schedule  Using her RUA AVF. She denies fevers, chills, Chest pain , sob, cough , N/V.  Denies dysuria/hematuria or melena,. Has some issues with Chronic constipation . Currently in her room  With "1 of 5 daughters" co lower abd discomfort pushing  her PCA  Button. Tells me pain is controlled      Past Medical History:  Diagnosis Date  . Anemia   . Arthritis    "hips, knees, shoulders" (11/10/2013)  . Asthma   . Chronic lower back pain   . Constipation   . COPD (chronic obstructive pulmonary disease) ([place])   . ESRD (end stage renal disease) on dialysis [facility])    "1st treatment 11/09/2013" MWF.  [place] [address]  . GERD (gastroesophageal reflux disease)   . Gout   . High cholesterol   . History of blood transfusion 1987; 2011   "related to lung OR; HgB went down to 5"  . Hypertension   . Hypothyroidism   . Kidney stones   . Multiple myeloma ([place])   . Neuropathy ([place])   . Neuropathy in diabetes ([place])    Hx: of  . Pulmonary embolism ([place]) 1987  . Type II diabetes mellitus ([place])     Past Surgical History:  Procedure Laterality Date  . ABDOMINAL HYSTERECTOMY  1964  . ABDOMINOPLASTY  ~ 1983   "tummy tuck"  . APPENDECTOMY  1954  . AV FISTULA PLACEMENT Left 2011   "lower arm; never used"  . AV FISTULA PLACEMENT Left 05/15/2013   Procedure: INSERTION OF  ARTERIOVENOUS (AV) GORE-TEX GRAFT ARM-LEFT UPPER ARM;  Surgeon: Mal Misty, MD;  Location: [place];  Service: Vascular;  Laterality: Left;  . AV FISTULA PLACEMENT Right 10/19/2014   Procedure: INSERTION OF ARTERIOVENOUS (AV) 4-83m x 45cm GORE-TEX GRAFT RIGHT FOREARM;  Surgeon: CAngelia Mould MD;  [place]  Service: Vascular;  Laterality: Right;  . AV FISTULA PLACEMENT Right 02/15/2015   Procedure: INSERTION OF RIGHT ARTERIOVENOUS (AV) GORE-TEX GRAFT ARM;  Surgeon: CAngelia Mould MD;  [place]  Service: Vascular;  Laterality: Right;  . CHOLECYSTECTOMY    . COLONOSCOPY    . CYSTOSCOPY W/ STONE MANIPULATION  1980's  . EYE SURGERY Bilateral 2016   [facility]-   . HERNIA REPAIR     umbicical hernia  . LUNG REMOVAL, PARTIAL Left 1987   "blood clot"  . THROMBECTOMY W/ EMBOLECTOMY Right 02/18/2015   Procedure: THROMBECTOMY RIGHT UPPER ARM ARTERIOVENOUS GORE-TEX GRAFT with revision of arterial anastamosis;  Surgeon: JMal Misty MD;  [place]  Service: Vascular;  Laterality: Right;  . TUBAL LIGATION  1964  . UMBILICAL HERNIA REPAIR  ~ 1983      Family History  Problem Relation Age of Onset  . Blindness Father   . Diabetes Father   . Diabetes Sister   . Kidney failure Brother  reports that she has never smoked. She has never used smokeless tobacco. She reports that she does not drink alcohol or use drugs.   Allergies  Allergen Reactions  . Penicillins Cross Reactors Other (See Comments)    Tongue Swelling. Has patient had a PCN reaction causing immediate rash, facial/tongue/throat swelling, SOB or lightheadedness with hypotension: YES Has patient had a PCN reaction causing severe rash involving mucus membranes or skin necrosis: NO Has patient had a PCN reaction that required hospitalization NO Has patient had a PCN reaction occurring within the last 10 years: NO If all of the above answers are "NO", then may proceed with Cephalosporin use.     Prior to Admission medications   Medication Sig Start Date End Date Taking? Authorizing Provider  albuterol (PROVENTIL HFA;VENTOLIN HFA) 108 (90 BASE) MCG/ACT inhaler Inhale 1 puff into the lungs 2 (two) times daily as needed for wheezing or shortness of breath.   Yes Historical Provider, MD  allopurinol (ZYLOPRIM) 100 MG tablet Take 100 mg by mouth daily with breakfast.    Yes Historical Provider, MD  amLODipine (NORVASC) 5 MG tablet Take 1 tablet by mouth daily. 08/26/15  Yes Historical Provider, MD  cinacalcet (SENSIPAR) 30 MG tablet Take 30 mg by mouth daily.   Yes Historical Provider, MD  Cyanocobalamin (VITAMIN B-12 PO) Take 1 tablet by mouth daily.   Yes Historical Provider, MD  ergocalciferol (VITAMIN D2) 50000 UNITS capsule Take 50,000 Units by mouth once a week. Take on Thursdays   Yes Historical Provider, MD  gabapentin (NEURONTIN) 300 MG capsule Take 300 mg by mouth every 8 (eight) hours. 11/05/15  Yes Historical Provider, MD  glimepiride (AMARYL) 1 MG tablet Take 1 mg by mouth daily with breakfast.    Yes Historical Provider, MD  HYDROcodone-acetaminophen (NORCO) 10-325 MG tablet Take 1 tablet by mouth every 6 (six) hours as needed for moderate pain.   Yes Historical Provider, MD  hydrOXYzine (ATARAX/VISTARIL) 25 MG tablet Take 25 mg by mouth every 12 (twelve) hours as needed for itching.  09/07/14  Yes Historical Provider, MD  insulin aspart (NOVOLOG) 100 UNIT/ML injection Inject 5 Units into the skin daily as needed for high blood sugar (if blood sugar is over 200).   Yes Historical Provider, MD  LEVEMIR FLEXTOUCH 100 UNIT/ML Pen Inject 12 Units into the skin daily as needed (high blood sugar). If Blood sugar is above 110 pt will take 09/15/14  Yes Historical Provider, MD  levothyroxine (SYNTHROID, LEVOTHROID) 100 MCG tablet Take 100 mcg by mouth daily. 09/01/14  Yes Historical Provider, MD  multivitamin (RENA-VIT) TABS tablet Take 1 tablet by mouth daily.   Yes Historical Provider, MD   Oxycodone HCl 20 MG TABS Take 20 mg by mouth every 12 (twelve) hours.  06/07/13  Yes Historical Provider, MD  pantoprazole (PROTONIX) 40 MG tablet Take 40 mg by mouth 2 (two) times daily.    Yes Historical Provider, MD  saxagliptin HCl (ONGLYZA) 2.5 MG TABS tablet Take 2.5 mg by mouth daily with breakfast.    Yes Historical Provider, MD  tiZANidine (ZANAFLEX) 4 MG tablet Take 4 mg by mouth 2 (two) times daily as needed for muscle spasms. Reported on 11/17/2015 01/11/15  Yes Historical Provider, MD  acyclovir (ZOVIRAX) 400 MG tablet TAKE 1 TABLET(400 MG) BY MOUTH DAILY Patient not taking: Reported on 11/17/2015 03/25/15   Curt Bears, MD  B-D ULTRAFINE III SHORT PEN 31G X 8 MM MISC  07/19/14   Historical Provider, MD  dexamethasone (  DECADRON) 4 MG tablet TAKE 10 TABLETS(40 MG) BY MOUTH 1 TIME A WEEK Patient not taking: Reported on 06/13/2015 03/25/15   Curt Bears, MD  lidocaine-prilocaine (EMLA) cream Apply 1 application topically daily as needed (for numbing).  11/06/13   Historical Provider, MD  ondansetron (ZOFRAN) 8 MG tablet Take 1 tablet (8 mg total) by mouth every 8 (eight) hours as needed for nausea or vomiting. Patient not taking: Reported on 04/07/2015 08/05/13   Curt Bears, MD  ONE [facility] ULTRA TEST test strip  12/05/13   Historical Provider, MD    KZS:[id number], albuterol, diphenhydrAMINE **OR** diphenhydrAMINE, naloxone **AND** sodium chloride flush, ondansetron (ZOFRAN) IV  Results for orders placed or performed during the hospital encounter of 01/23/2016 (from the past 48 hour(s))  Lipase, blood     Status: None   Collection Time: 01/02/16 10:30 PM  Result Value Ref Range   Lipase 16 11 - 51 U/L  Comprehensive metabolic panel     Status: Abnormal   Collection Time: 01/02/16 10:30 PM  Result Value Ref Range   Sodium 137 135 - 145 mmol/L   Potassium 4.1 3.5 - 5.1 mmol/L   Chloride 94 (L) 101 - 111 mmol/L   CO2 28 22 - 32 mmol/L   Glucose, Bld 110 (H) 65 - 99 mg/dL   BUN 25  (H) 6 - 20 mg/dL   Creatinine, Ser 6.18 (H) 0.44 - 1.00 mg/dL   Calcium 8.4 (L) 8.9 - 10.3 mg/dL   Total Protein 7.8 6.5 - 8.1 g/dL   Albumin 3.2 (L) 3.5 - 5.0 g/dL   AST 15 15 - 41 U/L   ALT 11 (L) 14 - 54 U/L   Alkaline Phosphatase 118 38 - 126 U/L   Total Bilirubin 0.2 (L) 0.3 - 1.2 mg/dL   GFR calc non Af Amer 6 (L) >60 mL/min   GFR calc Af Amer 7 (L) >60 mL/min    Comment: (NOTE) The eGFR has been calculated using the CKD EPI equation. This calculation has not been validated in all clinical situations. eGFR's persistently <60 mL/min signify possible Chronic Kidney Disease.    Anion gap 15 5 - 15  CBC     Status: Abnormal   Collection Time: 01/02/16 10:30 PM  Result Value Ref Range   WBC 15.1 (H) 4.0 - 10.5 K/uL   RBC 3.60 (L) 3.87 - 5.11 MIL/uL   Hemoglobin 10.9 (L) 12.0 - 15.0 g/dL   HCT 34.1 (L) 36.0 - 46.0 %   MCV 94.7 78.0 - 100.0 fL   MCH 30.3 26.0 - 34.0 pg   MCHC 32.0 30.0 - 36.0 g/dL   RDW 16.1 (H) 11.5 - 15.5 %   Platelets 339 150 - 400 K/uL  I-Stat CG4 Lactic Acid, ED     Status: None   Collection Time: 01/04/2016  2:10 AM  Result Value Ref Range   Lactic Acid, Venous 0.83 0.5 - 1.9 mmol/L  CBG monitoring, ED     Status: None   Collection Time: 01/30/2016  8:00 AM  Result Value Ref Range   Glucose-Capillary 82 65 - 99 mg/dL    ROS:  See hpi   Physical Exam: Vitals:   01/19/2016 0800 01/21/2016 0942  BP: (!) 121/49   Pulse: 71   Resp: 13 13  Temp:       General: alert Obese, AA Fem. Alert OX3 , NAD, appears slightly uncomfortable  Co abdominal discomfort  HEENT: [place] , MMM , eomi  Neck: supple ,  no jvd Heart: RRR no mur , rub or gallop Lungs: CTA  bilat  non labored breathing Abdomen: Obese, BS pos. , soft , Tender bilat lower quads/ Non distended, no masses palpated  Extremities: no pedal edema Skin:  No overt rash, warm dry  Neuro: Moves all extrem. , OX3, no acute overt focal deficits  Dialysis Access: pos bruit RUA AVF  Dialysis Orders: Center:  [facility]   on MWF . EDW 91.5  Bath 2k/ 2.25ca  Time 4hr Heparin 6000. Access RUA AVF BFR450 DFR 800    Hec 4 mcg IV/HD  aranesp 60 q week /HD  Venofer  '50mg'$  q wkkly  Hd   Other op labs  HGB     Assessment/Plan  1. Acute Diverticulitis with  Evolving abscess- admit team Wu /  On Cipro and Flagyl IV IR  eval in process /  2. ESRD -  MWF on schdule k and vol okay today  / no hep hd  3. Hypertension/volume  - bp 121/49 / as op on Norvasc '5mg'$  / No excess vol on cxr or exam  4. Anemia of  ESRD  - hg 10.9 continue weekly ESa and fe  5. Metabolic bone disease -  corec ca 9.0  On hec  q hd /  When eating = sensipar and binder   6. MM= "reported in Remission    7. Chronic low back pain=  On pain meds  As op / now PCA MS 8. IDDM= per admit  9. Left adrenal mass /Splenic lesion= on CT  Admit team wu  10. Nutrition - npo for now (Carb mod Neysa Hotter diet when eating  Renal vit )  Ernest Haber, PA-C [place] [phone] 01/23/2016, 10:04 AM   Patient seen and examined, agree with above note with above modifications. Pt comfortable on PCA- supportive care with antibiotics and no drain for now- will plan for routine HD tomorrow via AVF  Corliss Parish, MD 01/14/2016

## 2016-01-04 ENCOUNTER — Inpatient Hospital Stay (HOSPITAL_COMMUNITY): Payer: Medicare Other

## 2016-01-04 DIAGNOSIS — Z794 Long term (current) use of insulin: Secondary | ICD-10-CM

## 2016-01-04 DIAGNOSIS — G8929 Other chronic pain: Secondary | ICD-10-CM

## 2016-01-04 DIAGNOSIS — E119 Type 2 diabetes mellitus without complications: Secondary | ICD-10-CM

## 2016-01-04 DIAGNOSIS — M545 Low back pain: Secondary | ICD-10-CM

## 2016-01-04 LAB — BASIC METABOLIC PANEL
Anion gap: 14 (ref 5–15)
BUN: 40 mg/dL — AB (ref 6–20)
CALCIUM: 7.9 mg/dL — AB (ref 8.9–10.3)
CO2: 25 mmol/L (ref 22–32)
CREATININE: 9.8 mg/dL — AB (ref 0.44–1.00)
Chloride: 97 mmol/L — ABNORMAL LOW (ref 101–111)
GFR, EST AFRICAN AMERICAN: 4 mL/min — AB (ref >60)
GFR, EST NON AFRICAN AMERICAN: 3 mL/min — AB (ref >60)
Glucose, Bld: 84 mg/dL (ref 65–99)
Potassium: 4.7 mmol/L (ref 3.5–5.1)
SODIUM: 136 mmol/L (ref 135–145)

## 2016-01-04 LAB — CBC
HCT: 30.6 % — ABNORMAL LOW (ref 36.0–46.0)
HEMATOCRIT: 31.5 % — AB (ref 36.0–46.0)
Hemoglobin: 10 g/dL — ABNORMAL LOW (ref 12.0–15.0)
Hemoglobin: 9.7 g/dL — ABNORMAL LOW (ref 12.0–15.0)
MCH: 30.4 pg (ref 26.0–34.0)
MCH: 30.4 pg (ref 26.0–34.0)
MCHC: 31.7 g/dL (ref 30.0–36.0)
MCHC: 31.7 g/dL (ref 30.0–36.0)
MCV: 95.7 fL (ref 78.0–100.0)
MCV: 95.9 fL (ref 78.0–100.0)
PLATELETS: 254 10*3/uL (ref 150–400)
PLATELETS: 286 10*3/uL (ref 150–400)
RBC: 3.29 MIL/uL — ABNORMAL LOW (ref 3.87–5.11)
RBC: 3.19 MIL/uL — AB (ref 3.87–5.11)
RDW: 16.2 % — AB (ref 11.5–15.5)
RDW: 16.2 % — AB (ref 11.5–15.5)
WBC: 18.8 10*3/uL — AB (ref 4.0–10.5)
WBC: 18.5 10*3/uL — AB (ref 4.0–10.5)

## 2016-01-04 LAB — GLUCOSE, CAPILLARY
GLUCOSE-CAPILLARY: 97 mg/dL (ref 65–99)
GLUCOSE-CAPILLARY: 94 mg/dL (ref 65–99)
Glucose-Capillary: 119 mg/dL — ABNORMAL HIGH (ref 65–99)
Glucose-Capillary: 87 mg/dL (ref 65–99)

## 2016-01-04 LAB — RENAL FUNCTION PANEL
Albumin: 2.6 g/dL — ABNORMAL LOW (ref 3.5–5.0)
Anion gap: 13 (ref 5–15)
BUN: 40 mg/dL — AB (ref 6–20)
CALCIUM: 8 mg/dL — AB (ref 8.9–10.3)
CHLORIDE: 97 mmol/L — AB (ref 101–111)
CO2: 26 mmol/L (ref 22–32)
CREATININE: 9.93 mg/dL — AB (ref 0.44–1.00)
GFR, EST AFRICAN AMERICAN: 4 mL/min — AB (ref >60)
GFR, EST NON AFRICAN AMERICAN: 3 mL/min — AB (ref >60)
Glucose, Bld: 81 mg/dL (ref 65–99)
Phosphorus: 6.1 mg/dL — ABNORMAL HIGH (ref 2.5–4.6)
Potassium: 4.5 mmol/L (ref 3.5–5.1)
SODIUM: 136 mmol/L (ref 135–145)

## 2016-01-04 LAB — LACTIC ACID, PLASMA: LACTIC ACID, VENOUS: 0.8 mmol/L (ref 0.5–1.9)

## 2016-01-04 LAB — HEPATITIS B SURFACE ANTIGEN: HEP B S AG: NEGATIVE

## 2016-01-04 MED ORDER — ALTEPLASE 2 MG IJ SOLR: 2.0000 mg | Freq: Once | INTRAMUSCULAR | Status: DC | PRN

## 2016-01-04 MED ORDER — HEPARIN SODIUM (PORCINE) 1000 UNIT/ML DIALYSIS: 1000.0000 [IU] | INTRAMUSCULAR | Status: DC | PRN

## 2016-01-04 MED ORDER — SODIUM CHLORIDE 0.9 % IV BOLUS (SEPSIS)
250.0000 mL | Freq: Once | INTRAVENOUS | Status: AC
  Administered 2016-01-04: 250 mL via INTRAVENOUS

## 2016-01-04 MED ORDER — DARBEPOETIN ALFA 100 MCG/0.5ML IJ SOSY
PREFILLED_SYRINGE | INTRAMUSCULAR | Status: AC
  Filled 2016-01-04: qty 0.5

## 2016-01-04 MED ORDER — SODIUM CHLORIDE 0.9 % IV SOLN: 100.0000 mL | INTRAVENOUS | Status: DC | PRN

## 2016-01-04 MED ORDER — LIDOCAINE-PRILOCAINE 2.5-2.5 % EX CREA: 1.0000 "application " | TOPICAL_CREAM | CUTANEOUS | Status: DC | PRN

## 2016-01-04 MED ORDER — PENTAFLUOROPROP-TETRAFLUOROETH EX AERO: 1.0000 "application " | INHALATION_SPRAY | CUTANEOUS | Status: DC | PRN

## 2016-01-04 MED ORDER — DOXERCALCIFEROL 4 MCG/2ML IV SOLN
INTRAVENOUS | Status: AC
  Filled 2016-01-04: qty 2

## 2016-01-04 MED ORDER — LIDOCAINE HCL (PF) 1 % IJ SOLN: 5.0000 mL | INTRAMUSCULAR | Status: DC | PRN

## 2016-01-04 MED ORDER — ACETAMINOPHEN 650 MG RE SUPP
650.0000 mg | RECTAL | Status: DC | PRN
  Administered 2016-01-04: 650 mg via RECTAL

## 2016-01-04 MED ORDER — DARBEPOETIN ALFA 100 MCG/0.5ML IJ SOSY
100.0000 ug | PREFILLED_SYRINGE | INTRAMUSCULAR | Status: DC
  Administered 2016-01-04 – 2016-01-11 (×2): 100 ug via INTRAVENOUS
  Filled 2016-01-04: qty 0.5

## 2016-01-04 NOTE — Progress Notes (Signed)
Subjective:  Events noted- had temp- altered MS and jerking overnight - HCT neg- seen on HD- much different from yesterday  Objective Vital signs in last 24 hours: Vitals:   01/04/16 0845 01/04/16 0900 01/04/16 0930 01/04/16 1000  BP: (!) 104/51 (!) 139/52 (!) 162/54 (!) 141/62  Pulse: 74 76 88 92  Resp:      Temp:      TempSrc:      SpO2:      Weight:      Height:       Weight change: -0.001 kg (-0 oz)  Intake/Output Summary (Last 24 hours) at 01/04/16 1027 Last data filed at 01/24/2016 1300  Gross per 24 hour  Intake                0 ml  Output                0 ml  Net                0 ml   Dialysis Orders: Center: [facility]   on MWF . EDW 91.5  Bath 2k/ 2.25ca  Time 4hr Heparin 6000. Access RUA AVF BFR450 DFR 800    Hec 4 mcg IV/HD  aranesp 60 q week /HD  Venofer  '50mg'$  q wkkly  Hd   Other op labs  HGB     Assessment/Plan  1. Acute Diverticulitis with  Evolving abscess- admit team Wu /  On Cipro and Flagyl IV IR  eval in process /  2. ESRD -  MWF on schdule k and vol okay today  / no hep hd  3. Jerking- new MS change- HCT negative- could it be narcotics/antibiotics (cipro does sometimes)- I will stop neurontin because that definitely can do- has completed 3 hours of HD and still having these issues.   4. Hypertension/volume  - bp higher / as op on Norvasc '5mg'$  / No excess vol on cxr or exam - will follow 5. Anemia of  ESRD  - hg 10.9 now down to 9.7 continue weekly ESa and fe  6. Metabolic bone disease -  corec ca 9.0  On hec  q hd /  When eating = sensipar and binder   7. MM= "reported in Remission  8. Chronic low back pain=  On pain meds  As op / now PCA MS 9. IDDM= per admit  10. Left adrenal mass /Splenic lesion= on CT  Admit team wu  11. Nutrition - npo for now (Carb mod Neysa Hotter diet when eating  Renal vit )    Akeia Perot A    Labs: Basic Metabolic Panel:  Recent Labs Lab 01/02/16 2230 01/04/16 0639 01/04/16 0745  NA 137 136 136  K 4.1 4.7 4.5   CL 94* 97* 97*  CO2 '28 25 26  '$ GLUCOSE 110* 84 81  BUN 25* 40* 40*  CREATININE 6.18* 9.80* 9.93*  CALCIUM 8.4* 7.9* 8.0*  PHOS  --   --  6.1*   Liver Function Tests:  Recent Labs Lab 01/02/16 2230 01/04/16 0745  AST 15  --   ALT 11*  --   ALKPHOS 118  --   BILITOT 0.2*  --   PROT 7.8  --   ALBUMIN 3.2* 2.6*    Recent Labs Lab 01/02/16 2230  LIPASE 16   No results for input(s): AMMONIA in the last 168 hours. CBC:  Recent Labs Lab 01/02/16 2230 01/04/16 0639 01/04/16 0745  WBC 15.1* 18.8* 18.5*  HGB 10.9* 10.0* 9.7*  HCT 34.1* 31.5* 30.6*  MCV 94.7 95.7 95.9  PLT 339 254 286   Cardiac Enzymes: No results for input(s): CKTOTAL, CKMB, CKMBINDEX, TROPONINI in the last 168 hours. CBG:  Recent Labs Lab 01/31/2016 1647 01/28/2016 1742 01/13/2016 2002 01/17/2016 2349 01/04/16 0240  GLUCAP 65 137* 92 83 97    Iron Studies: No results for input(s): IRON, TIBC, TRANSFERRIN, FERRITIN in the last 72 hours. Studies/Results: Dg Chest 2 View  Result Date: 01/21/2016 CLINICAL DATA:  Cough and fever tonight. EXAM: CHEST  2 VIEW COMPARISON:  01/23/2015 FINDINGS: Mild cardiomegaly. There is atherosclerosis of the thoracic aorta, unchanged aortic and mediastinal contours. Stable pleural-parenchymal scarring at the left lung base. New linear opacity in the periphery of the right midlung zone. Chronic bronchial thickening is unchanged. No pleural effusion or pneumothorax. A vascular stent in the left axilla. There is degenerative change throughout spine. IMPRESSION: 1. New linear opacity in the right midlung zone likely subsegmental atelectasis or scarring. Stable pleural parenchymal scarring at the left lung base. 2. Aortic atherosclerosis. Electronically Signed   By: Jeb Levering M.D.   On: 01/09/2016 02:51   Ct Head Wo Contrast  Result Date: 01/04/2016 CLINICAL DATA:  Responsive. Tremor. History of multiple myeloma, hypertension, hypercholesterolemia diabetes. EXAM: CT HEAD  WITHOUT CONTRAST TECHNIQUE: Contiguous axial images were obtained from the base of the skull through the vertex without intravenous contrast. COMPARISON:  CT HEAD February 01, 2005 FINDINGS: Moderately motion degraded examination. INTRACRANIAL CONTENTS: The ventricles and sulci are normal for age. No intraparenchymal hemorrhage, mass effect nor midline shift. Patchy supratentorial white matter hypodensities are less than expected for patient's age and though non-specific likely represent chronic small vessel ischemic disease. No acute large vascular territory infarcts. No abnormal extra-axial fluid collections. Basal cisterns are patent. Moderate calcific atherosclerosis of the carotid siphons. ORBITS: The included ocular globes and orbital contents are non-suspicious. SINUSES: The mastoid aircells and included paranasal sinuses are well-aerated. SKULL/SOFT TISSUES: No skull fracture. No significant soft tissue swelling. IMPRESSION: Negative moderately motion degraded CT HEAD for age. Electronically Signed   By: Elon Alas M.D.   On: 01/04/2016 03:46   Ct Abdomen Pelvis W Contrast  Result Date: 01/14/2016 CLINICAL DATA:  82 year old female with lower abdominal pain EXAM: CT ABDOMEN AND PELVIS WITH CONTRAST TECHNIQUE: Multidetector CT imaging of the abdomen and pelvis was performed using the standard protocol following bolus administration of intravenous contrast. CONTRAST:  1 ISOVUE-300 IOPAMIDOL (ISOVUE-300) INJECTION 61% COMPARISON:  CT dated 09/24/2012 FINDINGS: The visualized lung bases are clear. No intra-abdominal free air. Trace free fluid may be present within the pelvis. Cholecystectomy. Mild biliary ductal dilatation, likely post cholecystectomy. The pancreas is unremarkable. There is a 2 cm hypodense lesion in the spleen which is incompletely characterized but may represent a cyst or hemangioma. There is a 2 cm left adrenal high attenuating or enhancing lesion which is increased in size compared  to the prior study. MRI without and with contrast is recommended for further characterization. There is severe bilateral renal atrophy. Small nonobstructing bilateral renal calculi measuring up to 4 mm in the upper pole of the left kidney. There is no hydronephrosis on either side. Bilateral renal upper pole exophytic hypodense lesions are not well characterized but likely represent cysts. The visualized ureters and urinary bladder appear unremarkable. Hysterectomy. There is extensive sigmoid diverticulosis with muscular hypertrophy. There is active inflammatory changes of the sigmoid colon compatible with acute diverticulitis. There is an ill-defined 2.8 x  4.5 cm complex collection with hazy organizing wall extending from the sigmoid colon anteriorly adjacent to the posterior wall of the urinary bladder compatible with diverticular abscess formation. There is no evidence of bowel obstruction. Normal appendix. There is moderate aortoiliac atherosclerotic disease. The origins of the celiac axis, SMA, IMA as well as the origins of the renal arteries are patent. No portal venous gas identified. There is no adenopathy. There is a midline vertical anterior pelvic wall incisional scar with a small fat containing umbilical hernia. The abdominal wall soft tissues are otherwise unremarkable. There is degenerative changes of the spine with multilevel disc desiccation with vacuum phenomena. No acute fracture. IMPRESSION: Sigmoid diverticulitis with developing diverticular abscess. Enhancing left adrenal lesion. MRI without and with contrast is recommended for further characterization. Electronically Signed   By: Anner Crete M.D.   On: 01/17/2016 05:09   Medications: Infusions: . dextrose 5 % and 0.45% NaCl 30 mL/hr at 01/30/2016 2030    Scheduled Medications: . ciprofloxacin  400 mg Intravenous Q24H  . doxercalciferol  4 mcg Intravenous Q M,W,F-HD  . gabapentin  300 mg Oral Q8H  . heparin  5,000 Units  Subcutaneous Q8H  . insulin aspart  0-9 Units Subcutaneous Q4H  . insulin detemir  5 Units Subcutaneous Q2200  . levothyroxine  100 mcg Oral QAC breakfast  . metronidazole  500 mg Intravenous Q8H  . morphine   Intravenous Q4H    have reviewed scheduled and prn medications.  Physical Exam: General: jerking- looks at me says hi but unable to participate in further conversation Heart: RRR Lungs: mostly clear Abdomen: soft Extremities: no edema Dialysis Access: right AVF   01/04/2016,10:27 AM  LOS: 1 day

## 2016-01-04 NOTE — Progress Notes (Addendum)
CT head neg for acute issues. When transporting pt back to floor, she acknowledged RRRN and stated her name. Temp down now. ? Seizure although no witnessed shaking and pt not sleepy or exhibiting sx of being post ictal. Will continue to watch for now. Labs pending.  KJKG, NP [facility]

## 2016-01-04 NOTE — Progress Notes (Signed)
Tylenol suppository given. Bolus infusing.RN accompanied patient to CT scan. Patient more responsive on the way back to the floor. Patient was able to tell her name, location and that she has 5 daughters. Will continue to monitor.

## 2016-01-04 NOTE — Progress Notes (Addendum)
Shift event note:  Notified by RN that pt currently awake but does not respond verbally as she was able to do earlier this evening. RR RN was paged and responded to bedside. "Brook" RR RN reports non-focal neuro exam but confirms pt not responding verbally. Pt also noted to have rectal temp of 101.9. Has been febrile intermittently today and bc x 2 were obtained. RR RN has requested a provider evaluate pt. Daughter at bedside and confirms this is quite different mentation than earlier today.  Assessment/Plan: 1. AMS: Possible related to high fever. Neurological event also on the differential. Tylenol given w/ small IVF bolus. Spoke w/ my colleague K.Kirby-Graham, NP on the [facility] who has agreed to contact [place] (RR RN) regarding change in pt's status.  Jeryl Columbia, NP-C [facility] Pager [phone]

## 2016-01-04 NOTE — Consult Note (Signed)
[facility] Surgery Consult Note  [name] 1933/02/03  [id number].    Requesting MD: Dr. Marily Memos Chief Complaint/Reason for Consult: diverticulitis with abscess formation. HPI:  82 y/o female with PMH CKD on HD, DM on insulin, HLD, PMH PE, HTN, hypothyroidism and chronic low back pain who presented to [facility] with 1 week of lower abdominal pain that was not associated with fever, chills, nausea, or vomiting. CT scan revealed diverticulitis with abscess formation (2.8 x 4.5 cm) and the patient was admitted to the hospital. Interventional radiology was consulted for possible percutaneous drainage of the abscess but, due to size and location of abscess they will not drain the abscess at this time. General surgery asked to see the patient and give recommendations. She is currently on cipro/flagyl. Today patient reports bouts of diverticulitis in the past, years ago, that resolved with antibiotics. She denies abdominal pain at this time. Denies nausea/vomiting. Last BM was 01/02/16 and she was unable to communicate the caliber of her stools.  I discussed treatment with antibiotics and the indications for surgery if she does not improve on antibiotics. She is resistant to surgery at this time, and became tearful when I brought up the possibility of partial colectomy with ostomy placement.   Rectal temp today 101.9 WBC 18.8  ROS: All systems reviewed and otherwise negative except for as above  Family History  Problem Relation Age of Onset  . Blindness Father   . Diabetes Father   . Diabetes Sister   . Kidney failure Brother     Past Medical History:  Diagnosis Date  . Anemia   . Arthritis    "hips, knees, shoulders" (11/10/2013)  . Asthma   . Chronic lower back pain   . Constipation   . COPD (chronic obstructive pulmonary disease) ([place])   . ESRD (end stage renal disease) on dialysis [facility])    "1st treatment 11/09/2013" MWF.  [address]  . GERD (gastroesophageal reflux disease)   .  Gout   . High cholesterol   . History of blood transfusion 1987; 2011   "related to lung OR; HgB went down to 5"  . Hypertension   . Hypothyroidism   . Kidney stones   . Multiple myeloma ([place])   . Neuropathy ([place])   . Neuropathy in diabetes ([place])    Hx: of  . Pulmonary embolism ([place]) 1987  . Type II diabetes mellitus ([place])     Past Surgical History:  Procedure Laterality Date  . ABDOMINAL HYSTERECTOMY  1964  . ABDOMINOPLASTY  ~ 1983   "tummy tuck"  . APPENDECTOMY  1954  . AV FISTULA PLACEMENT Left 2011   "lower arm; never used"  . AV FISTULA PLACEMENT Left 05/15/2013   Procedure: INSERTION OF ARTERIOVENOUS (AV) GORE-TEX GRAFT ARM-LEFT UPPER ARM;  Surgeon: Mal Misty, MD;  Location: [place];  Service: Vascular;  Laterality: Left;  . AV FISTULA PLACEMENT Right 10/19/2014   Procedure: INSERTION OF ARTERIOVENOUS (AV) 4-10m x 45cm GORE-TEX GRAFT RIGHT FOREARM;  Surgeon: CAngelia Mould MD;  [place]  Service: Vascular;  Laterality: Right;  . AV FISTULA PLACEMENT Right 02/15/2015   Procedure: INSERTION OF RIGHT ARTERIOVENOUS (AV) GORE-TEX GRAFT ARM;  Surgeon: CAngelia Mould MD;  [place]  Service: Vascular;  Laterality: Right;  . CHOLECYSTECTOMY    . COLONOSCOPY    . CYSTOSCOPY W/ STONE MANIPULATION  1980's  . EYE SURGERY Bilateral 2016   CAtaract-   . HERNIA REPAIR  umbicical hernia  . LUNG REMOVAL, PARTIAL Left 1987   "blood clot"  . THROMBECTOMY W/ EMBOLECTOMY Right 02/18/2015   Procedure: THROMBECTOMY RIGHT UPPER ARM ARTERIOVENOUS GORE-TEX GRAFT with revision of arterial anastamosis;  Surgeon: Mal Misty, MD;  Location: [place];  Service: Vascular;  Laterality: Right;  . TUBAL LIGATION  1964  . UMBILICAL HERNIA REPAIR  ~ 1983    Social History:  reports that she has never smoked. She has never used smokeless tobacco. She reports that she does not drink alcohol or use drugs.  Allergies:  Allergies  Allergen Reactions  . Penicillins  Cross Reactors Other (See Comments)    Tongue Swelling. Has patient had a PCN reaction causing immediate rash, facial/tongue/throat swelling, SOB or lightheadedness with hypotension: YES Has patient had a PCN reaction causing severe rash involving mucus membranes or skin necrosis: NO Has patient had a PCN reaction that required hospitalization NO Has patient had a PCN reaction occurring within the last 10 years: NO If all of the above answers are "NO", then may proceed with Cephalosporin use.    Medications Prior to Admission  Medication Sig Dispense Refill  . albuterol (PROVENTIL HFA;VENTOLIN HFA) 108 (90 BASE) MCG/ACT inhaler Inhale 1 puff into the lungs 2 (two) times daily as needed for wheezing or shortness of breath.    . allopurinol (ZYLOPRIM) 100 MG tablet Take 100 mg by mouth daily with breakfast.     . amLODipine (NORVASC) 5 MG tablet Take 1 tablet by mouth daily.  4  . cinacalcet (SENSIPAR) 30 MG tablet Take 30 mg by mouth daily.    . Cyanocobalamin (VITAMIN B-12 PO) Take 1 tablet by mouth daily.    . ergocalciferol (VITAMIN D2) 50000 UNITS capsule Take 50,000 Units by mouth once a week. Take on Thursdays    . gabapentin (NEURONTIN) 300 MG capsule Take 300 mg by mouth every 8 (eight) hours.  3  . glimepiride (AMARYL) 1 MG tablet Take 1 mg by mouth daily with breakfast.     . HYDROcodone-acetaminophen (NORCO) 10-325 MG tablet Take 1 tablet by mouth every 6 (six) hours as needed for moderate pain.    . hydrOXYzine (ATARAX/VISTARIL) 25 MG tablet Take 25 mg by mouth every 12 (twelve) hours as needed for itching.   2  . insulin aspart (NOVOLOG) 100 UNIT/ML injection Inject 5 Units into the skin daily as needed for high blood sugar (if blood sugar is over 200).    Ernest Mallick FLEXTOUCH 100 UNIT/ML Pen Inject 12 Units into the skin daily as needed (high blood sugar). If Blood sugar is above 110 pt will take  5  . levothyroxine (SYNTHROID, LEVOTHROID) 100 MCG tablet Take 100 mcg by mouth daily.   4  . multivitamin (RENA-VIT) TABS tablet Take 1 tablet by mouth daily.    . Oxycodone HCl 20 MG TABS Take 20 mg by mouth every 12 (twelve) hours.     . pantoprazole (PROTONIX) 40 MG tablet Take 40 mg by mouth 2 (two) times daily.     . saxagliptin HCl (ONGLYZA) 2.5 MG TABS tablet Take 2.5 mg by mouth daily with breakfast.     . tiZANidine (ZANAFLEX) 4 MG tablet Take 4 mg by mouth 2 (two) times daily as needed for muscle spasms. Reported on 11/17/2015    . acyclovir (ZOVIRAX) 400 MG tablet TAKE 1 TABLET(400 MG) BY MOUTH DAILY (Patient not taking: Reported on 11/17/2015) 30 tablet 0  . B-D ULTRAFINE III SHORT PEN 31G X 8  MM MISC   4  . dexamethasone (DECADRON) 4 MG tablet TAKE 10 TABLETS(40 MG) BY MOUTH 1 TIME A WEEK (Patient not taking: Reported on 06/13/2015) 40 tablet 0  . lidocaine-prilocaine (EMLA) cream Apply 1 application topically daily as needed (for numbing).     . ondansetron (ZOFRAN) 8 MG tablet Take 1 tablet (8 mg total) by mouth every 8 (eight) hours as needed for nausea or vomiting. (Patient not taking: Reported on 04/07/2015) 20 tablet 1  . ONE TOUCH ULTRA TEST test strip       Blood pressure (!) 109/47, pulse 85, temperature 99.6 F (37.6 C), temperature source Oral, resp. rate 13, height '5\' 5"'$  (1.651 m), weight 91.3 kg (201 lb 4.5 oz), SpO2 98 %. Physical Exam: General: pleasant, obese AA female, oriented to person, place, time HEENT: head is normocephalic, atraumatic. Heart: regular, rate, and rhythm.  No obvious murmurs, gallops, or rubs noted.  Palpable pedal pulses bilaterally Lungs: on nasal cannula, CTAB, no wheezes, rhonchi, or rales noted.  Respiratory effort nonlabored Abd: soft, NT/ND, +BS, no masses, hernias, or organomegaly MS: all 4 extremities are symmetrical with no cyanosis, clubbing, or edema. Skin: warm and dry with no masses, lesions, or rashes Psych: A&Ox3 with an appropriate affect. Neuro:  normal speech  Results for orders placed or performed during the  hospital encounter of 01/13/2016 (from the past 48 hour(s))  Lipase, blood     Status: None   Collection Time: 01/02/16 10:30 PM  Result Value Ref Range   Lipase 16 11 - 51 U/L  Comprehensive metabolic panel     Status: Abnormal   Collection Time: 01/02/16 10:30 PM  Result Value Ref Range   Sodium 137 135 - 145 mmol/L   Potassium 4.1 3.5 - 5.1 mmol/L   Chloride 94 (L) 101 - 111 mmol/L   CO2 28 22 - 32 mmol/L   Glucose, Bld 110 (H) 65 - 99 mg/dL   BUN 25 (H) 6 - 20 mg/dL   Creatinine, Ser 6.18 (H) 0.44 - 1.00 mg/dL   Calcium 8.4 (L) 8.9 - 10.3 mg/dL   Total Protein 7.8 6.5 - 8.1 g/dL   Albumin 3.2 (L) 3.5 - 5.0 g/dL   AST 15 15 - 41 U/L   ALT 11 (L) 14 - 54 U/L   Alkaline Phosphatase 118 38 - 126 U/L   Total Bilirubin 0.2 (L) 0.3 - 1.2 mg/dL   GFR calc non Af Amer 6 (L) >60 mL/min   GFR calc Af Amer 7 (L) >60 mL/min    Comment: (NOTE) The eGFR has been calculated using the CKD EPI equation. This calculation has not been validated in all clinical situations. eGFR's persistently <60 mL/min signify possible Chronic Kidney Disease.    Anion gap 15 5 - 15  CBC     Status: Abnormal   Collection Time: 01/02/16 10:30 PM  Result Value Ref Range   WBC 15.1 (H) 4.0 - 10.5 K/uL   RBC 3.60 (L) 3.87 - 5.11 MIL/uL   Hemoglobin 10.9 (L) 12.0 - 15.0 g/dL   HCT 34.1 (L) 36.0 - 46.0 %   MCV 94.7 78.0 - 100.0 fL   MCH 30.3 26.0 - 34.0 pg   MCHC 32.0 30.0 - 36.0 g/dL   RDW 16.1 (H) 11.5 - 15.5 %   Platelets 339 150 - 400 K/uL  Blood culture (routine x 2)     Status: None (Preliminary result)   Collection Time: 01/14/2016  1:50 AM  Result Value  Ref Range   Specimen Description BLOOD LEFT ANTECUBITAL    Special Requests BOTTLES DRAWN AEROBIC ONLY 5CC    Culture NO GROWTH 1 DAY    Report Status PENDING   Blood culture (routine x 2)     Status: None (Preliminary result)   Collection Time: 01/13/2016  1:55 AM  Result Value Ref Range   Specimen Description BLOOD LEFT HAND    Special Requests  BOTTLES DRAWN AEROBIC ONLY 5CC    Culture NO GROWTH 1 DAY    Report Status PENDING   I-Stat CG4 Lactic Acid, ED     Status: None   Collection Time: 01/21/2016  2:10 AM  Result Value Ref Range   Lactic Acid, Venous 0.83 0.5 - 1.9 mmol/L  CBG monitoring, ED     Status: None   Collection Time: 01/13/2016  8:00 AM  Result Value Ref Range   Glucose-Capillary 82 65 - 99 mg/dL  Glucose, capillary     Status: Abnormal   Collection Time: 01/24/2016 11:31 AM  Result Value Ref Range   Glucose-Capillary 102 (H) 65 - 99 mg/dL  Glucose, capillary     Status: None   Collection Time: 01/23/2016  4:47 PM  Result Value Ref Range   Glucose-Capillary 65 65 - 99 mg/dL  Glucose, capillary     Status: Abnormal   Collection Time: 01/17/2016  5:42 PM  Result Value Ref Range   Glucose-Capillary 137 (H) 65 - 99 mg/dL  Glucose, capillary     Status: None   Collection Time: 01/16/2016  8:02 PM  Result Value Ref Range   Glucose-Capillary 92 65 - 99 mg/dL  Glucose, capillary     Status: None   Collection Time: 01/25/2016 11:49 PM  Result Value Ref Range   Glucose-Capillary 83 65 - 99 mg/dL   Comment 1 Notify RN    Comment 2 Document in Chart   Glucose, capillary     Status: None   Collection Time: 01/04/16  2:40 AM  Result Value Ref Range   Glucose-Capillary 97 65 - 99 mg/dL  CBC     Status: Abnormal   Collection Time: 01/04/16  6:39 AM  Result Value Ref Range   WBC 18.8 (H) 4.0 - 10.5 K/uL   RBC 3.29 (L) 3.87 - 5.11 MIL/uL   Hemoglobin 10.0 (L) 12.0 - 15.0 g/dL   HCT 31.5 (L) 36.0 - 46.0 %   MCV 95.7 78.0 - 100.0 fL   MCH 30.4 26.0 - 34.0 pg   MCHC 31.7 30.0 - 36.0 g/dL   RDW 16.2 (H) 11.5 - 15.5 %   Platelets 254 150 - 400 K/uL  Lactic acid, plasma     Status: None   Collection Time: 01/04/16  6:39 AM  Result Value Ref Range   Lactic Acid, Venous 0.8 0.5 - 1.9 mmol/L  Basic metabolic panel     Status: Abnormal   Collection Time: 01/04/16  6:39 AM  Result Value Ref Range   Sodium 136 135 - 145 mmol/L    Potassium 4.7 3.5 - 5.1 mmol/L   Chloride 97 (L) 101 - 111 mmol/L   CO2 25 22 - 32 mmol/L   Glucose, Bld 84 65 - 99 mg/dL   BUN 40 (H) 6 - 20 mg/dL   Creatinine, Ser 9.80 (H) 0.44 - 1.00 mg/dL    Comment: DELTA CHECK NOTED   Calcium 7.9 (L) 8.9 - 10.3 mg/dL   GFR calc non Af Amer 3 (L) >60 mL/min  GFR calc Af Amer 4 (L) >60 mL/min    Comment: (NOTE) The eGFR has been calculated using the CKD EPI equation. This calculation has not been validated in all clinical situations. eGFR's persistently <60 mL/min signify possible Chronic Kidney Disease.    Anion gap 14 5 - 15  CBC     Status: Abnormal   Collection Time: 01/04/16  7:45 AM  Result Value Ref Range   WBC 18.5 (H) 4.0 - 10.5 K/uL   RBC 3.19 (L) 3.87 - 5.11 MIL/uL   Hemoglobin 9.7 (L) 12.0 - 15.0 g/dL   HCT 30.6 (L) 36.0 - 46.0 %   MCV 95.9 78.0 - 100.0 fL   MCH 30.4 26.0 - 34.0 pg   MCHC 31.7 30.0 - 36.0 g/dL   RDW 16.2 (H) 11.5 - 15.5 %   Platelets 286 150 - 400 K/uL  Renal function panel     Status: Abnormal   Collection Time: 01/04/16  7:45 AM  Result Value Ref Range   Sodium 136 135 - 145 mmol/L   Potassium 4.5 3.5 - 5.1 mmol/L   Chloride 97 (L) 101 - 111 mmol/L   CO2 26 22 - 32 mmol/L   Glucose, Bld 81 65 - 99 mg/dL   BUN 40 (H) 6 - 20 mg/dL   Creatinine, Ser 9.93 (H) 0.44 - 1.00 mg/dL   Calcium 8.0 (L) 8.9 - 10.3 mg/dL   Phosphorus 6.1 (H) 2.5 - 4.6 mg/dL   Albumin 2.6 (L) 3.5 - 5.0 g/dL   GFR calc non Af Amer 3 (L) >60 mL/min   GFR calc Af Amer 4 (L) >60 mL/min    Comment: (NOTE) The eGFR has been calculated using the CKD EPI equation. This calculation has not been validated in all clinical situations. eGFR's persistently <60 mL/min signify possible Chronic Kidney Disease.    Anion gap 13 5 - 15  Glucose, capillary     Status: None   Collection Time: 01/04/16  1:18 PM  Result Value Ref Range   Glucose-Capillary 94 65 - 99 mg/dL   Dg Chest 2 View  Result Date: 01/18/2016 CLINICAL DATA:  Cough and fever  tonight. EXAM: CHEST  2 VIEW COMPARISON:  01/23/2015 FINDINGS: Mild cardiomegaly. There is atherosclerosis of the thoracic aorta, unchanged aortic and mediastinal contours. Stable pleural-parenchymal scarring at the left lung base. New linear opacity in the periphery of the right midlung zone. Chronic bronchial thickening is unchanged. No pleural effusion or pneumothorax. A vascular stent in the left axilla. There is degenerative change throughout spine. IMPRESSION: 1. New linear opacity in the right midlung zone likely subsegmental atelectasis or scarring. Stable pleural parenchymal scarring at the left lung base. 2. Aortic atherosclerosis. Electronically Signed   By: Jeb Levering M.D.   On: 01/23/2016 02:51   Ct Head Wo Contrast  Result Date: 01/04/2016 CLINICAL DATA:  Responsive. Tremor. History of multiple myeloma, hypertension, hypercholesterolemia diabetes. EXAM: CT HEAD WITHOUT CONTRAST TECHNIQUE: Contiguous axial images were obtained from the base of the skull through the vertex without intravenous contrast. COMPARISON:  CT HEAD February 01, 2005 FINDINGS: Moderately motion degraded examination. INTRACRANIAL CONTENTS: The ventricles and sulci are normal for age. No intraparenchymal hemorrhage, mass effect nor midline shift. Patchy supratentorial white matter hypodensities are less than expected for patient's age and though non-specific likely represent chronic small vessel ischemic disease. No acute large vascular territory infarcts. No abnormal extra-axial fluid collections. Basal cisterns are patent. Moderate calcific atherosclerosis of the carotid siphons. ORBITS: The included  ocular globes and orbital contents are non-suspicious. SINUSES: The mastoid aircells and included paranasal sinuses are well-aerated. SKULL/SOFT TISSUES: No skull fracture. No significant soft tissue swelling. IMPRESSION: Negative moderately motion degraded CT HEAD for age. Electronically Signed   By: Elon Alas M.D.    On: 01/04/2016 03:46   Ct Abdomen Pelvis W Contrast  Result Date: 01/31/2016 CLINICAL DATA:  82 year old female with lower abdominal pain EXAM: CT ABDOMEN AND PELVIS WITH CONTRAST TECHNIQUE: Multidetector CT imaging of the abdomen and pelvis was performed using the standard protocol following bolus administration of intravenous contrast. CONTRAST:  1 ISOVUE-300 IOPAMIDOL (ISOVUE-300) INJECTION 61% COMPARISON:  CT dated 09/24/2012 FINDINGS: The visualized lung bases are clear. No intra-abdominal free air. Trace free fluid may be present within the pelvis. Cholecystectomy. Mild biliary ductal dilatation, likely post cholecystectomy. The pancreas is unremarkable. There is a 2 cm hypodense lesion in the spleen which is incompletely characterized but may represent a cyst or hemangioma. There is a 2 cm left adrenal high attenuating or enhancing lesion which is increased in size compared to the prior study. MRI without and with contrast is recommended for further characterization. There is severe bilateral renal atrophy. Small nonobstructing bilateral renal calculi measuring up to 4 mm in the upper pole of the left kidney. There is no hydronephrosis on either side. Bilateral renal upper pole exophytic hypodense lesions are not well characterized but likely represent cysts. The visualized ureters and urinary bladder appear unremarkable. Hysterectomy. There is extensive sigmoid diverticulosis with muscular hypertrophy. There is active inflammatory changes of the sigmoid colon compatible with acute diverticulitis. There is an ill-defined 2.8 x 4.5 cm complex collection with hazy organizing wall extending from the sigmoid colon anteriorly adjacent to the posterior wall of the urinary bladder compatible with diverticular abscess formation. There is no evidence of bowel obstruction. Normal appendix. There is moderate aortoiliac atherosclerotic disease. The origins of the celiac axis, SMA, IMA as well as the origins of the  renal arteries are patent. No portal venous gas identified. There is no adenopathy. There is a midline vertical anterior pelvic wall incisional scar with a small fat containing umbilical hernia. The abdominal wall soft tissues are otherwise unremarkable. There is degenerative changes of the spine with multilevel disc desiccation with vacuum phenomena. No acute fracture. IMPRESSION: Sigmoid diverticulitis with developing diverticular abscess. Enhancing left adrenal lesion. MRI without and with contrast is recommended for further characterization. Electronically Signed   By: Anner Crete M.D.   On: 01/04/2016 05:09   Assessment/Plan Diverticulitis with abscess formation - non-tender on exam, febrile, severe diverticular disease on CT scan - continue IV cipro/flagyl  - check CBC in AM  ESRD HTN DM2, insulin dependent Hypothyroidism Multiple myeloma (in remission) Absolute anemia - baseline 10.8 Left adrenal mass/splenic lesion - MRI recommended  ID: cipro/flagyl DVT Proph: subcutaneous heparin Code status: DNR Plan: at this time I recommend non-operative management with IV abx. If her white count worsens, she becomes more tender, and is persistently febrile she will likely need reassessment for possible perc drain placement or will need to go to the OR for a wash out and hartmann's procedure.  Jill Alexanders, [facility] 01/04/2016, 4:14 PM Pager: [phone] Consults: [phone] Mon-Fri 7:00 am-4:30 pm Sat-Sun 7:00 am-11:30 am

## 2016-01-04 NOTE — Progress Notes (Signed)
Family asked to see MD- MD paged.

## 2016-01-04 NOTE — Progress Notes (Signed)
At 23  RN made aware that patient had an axillary temp of 101.2. Tylenol pulled to give, on arrival patient found to be alert but not able to speak. RN asked patient several questions (What is your name? Where are you?) to which she could not respond. Patient was also having tremors. RN called rapid response and NP Chaney Malling to notify about patient's condition. Per RR a rectal temp was to be taken (101.9). Orders placed for lab work,a stat CT scan and orders for a Tylenol suppository and a 250 mL bolus.

## 2016-01-04 NOTE — Progress Notes (Signed)
Patient is back from hemo- stable and asleep.

## 2016-01-04 NOTE — Progress Notes (Signed)
Pt had HD today, EEG will be done tomorrow. Next scheduled HD is for 8/4 per RN

## 2016-01-04 NOTE — Progress Notes (Signed)
Patient and patient's daughter refusing to allow lab to stick patient in her foot to obtain labs. Patient has bilateral arm restrictions. Levi Aland, NP made aware. Awaiting a return call.

## 2016-01-04 NOTE — Progress Notes (Signed)
Called for 2nd IV access. Restrictions for IV to both arms. 2nd IV not started. RR and primary RN aware.

## 2016-01-04 NOTE — Significant Event (Signed)
Rapid Response Event Note Called per floor RN regarding Pt with acute change in mental status. Per floor RN at 2200 Pt lethargic but oriented x 4. Now unable to speak with new "jerking". Advised to get rectal temp and CBG while RRT en route.   Overview: Time Called: [id number] Arrival Time: 0246 Event Type: Other (Comment)  Initial Focused Assessment: Pt found resting in bed, no distress. Eyes moving around the room, blinks to threat, occasional eye contact. Non rhythmic jerking assessed Withdraws to pain x 4 extremities. Does not follow commands. Skin warm to touch. Rectal temp 101.9, HR 82 BP 116/63 po2 100% on 2 L [place]. ABD soft mild distention, slight grimace to pressing on ABD. Lungs clear, heart tones WNL.   Interventions: CBG 97 . K. Schorr NP [facility] paged and updated. Tylenol 650 mg ordered PR given STAT. Tylene Fantasia in house [facility] NP to see shortly. Call received per Tylene Fantasia NP, updated on Pt status. STAT CT ordered. Pt escorted to CT scan at 0320. Pt tolerated fairly well. Upon arrival back to room at 0340 Pt awake, follows commands, denies pain, oriented to self, location time. VSS. Tylene Fantasia NP updated on Pt status.  Plan of Care (if not transferred): Will remain on [place], RN to monitor Pt closely, advised to notify myself and Provider for worsening changes. Will recheck rectal temp in about an hour.   Event Summary: Name of Physician Notified: Alanda Slim NP [facility] at 0241    at          [place], [place]

## 2016-01-04 NOTE — Progress Notes (Signed)
PROGRESS NOTE    [name]  FVL:[id number] DOB: August 15, 1932 DOA: 01/25/2016 PCP: Eino Farber, MD    Brief Narrative:  [name] is a 82 y.o. female with a Past Medical History of Anemia, Asthma, COPD, ESRD on dialysis, GERD, HLD, HTN, Hypothyroid, MM, DM, PE, DM,  who presents with diverticulitis w/ likely early abscess.   Assessment & Plan:   Principal Problem:   Diverticular disease of intestine with perforation and abscess Active Problems:   Diabetes mellitus with complication (HCC)   End stage renal disease (HCC)   Multiple myeloma (HCC)   Hypertension   Diabetes mellitus type 2, insulin dependent (HCC)   Absolute anemia   Hypothyroidism   Chronic low back pain   Left adrenal mass (HCC)   Splenic lesion  Acute diverticulitis with evolving abscess : Admitted for IV antibiotics, currently NPO,with sips of water with meds.  IR consulted for aspiration and drain placement, but couldn't do as it was not accesible.  Surgery consulted, recommended watching on IV antibiotics.  Pain control,.    ESRD On HD Further recommendations as per renal.  HD on MWF.   Hypertension:  Controlled.   Diabetes Mellitus: CBG (last 3)   Recent Labs  01/04/16 0240 01/04/16 1318 01/04/16 1616  GLUCAP 97 94 119*    On dextrose fluids.    Hypothyroidism: resume synthroid.   MM: In remission.    Left adrenal mass, increased in size from previous CT scan Will get MRI of the abdomen.    Chronic low back pain:  Currently on mophine pca, plan to resume home meds in the next 24 hours.     DVT prophylaxis: (Lovenox) Code Status: DNR Family Communication: discussed with two daughters at bedside.  Disposition Plan: pending further eval.   Consultants:   IR consult   Surgery.    Procedures: none   Antimicrobials:IV ciprofloxacin and IV flagyl 8/1   Subjective: Denies nausea, vomiting or abdominal pain, able to recognize her daughters .    She is alert   Objective: Vitals:   01/04/16 1214 01/04/16 1400 01/04/16 1600 01/04/16 1653  BP: (!) 132/46 (!) 109/47  (!) 109/49  Pulse: 82 85  73  Resp: 13 13 (!) 9 11  Temp: 99.7 F (37.6 C) 99.6 F (37.6 C)  99 F (37.2 C)  TempSrc: Oral Oral    SpO2: 100% 98% 100% 100%  Weight: 91.3 kg (201 lb 4.5 oz)     Height:        Intake/Output Summary (Last 24 hours) at 01/04/16 1855 Last data filed at 01/04/16 1654  Gross per 24 hour  Intake                0 ml  Output              998 ml  Net             -998 ml   Filed Weights   01/08/2016 0841 01/04/16 0728 01/04/16 1214  Weight: 93.4 kg (206 lb) 92.5 kg (203 lb 14.8 oz) 91.3 kg (201 lb 4.5 oz)    Examination:  General exam: Appears calm and comfortable  Respiratory system: Clear to auscultation. Respiratory effort normal. Cardiovascular system: S1 & S2 heard, RRR. No JVD, murmurs, rubs, gallops or clicks. No pedal edema. Gastrointestinal system: Abdomen is nondistended, soft and nontender. No organomegaly or masses felt. Normal bowel sounds heard. Central nervous system: Alert but slightly confused.  Extremities: Symmetric 5  x 5 power. Skin: No rashes, lesions or ulcers     Data Reviewed: I have personally reviewed following labs and imaging studies  CBC:  Recent Labs Lab 01/02/16 2230 01/04/16 0639 01/04/16 0745  WBC 15.1* 18.8* 18.5*  HGB 10.9* 10.0* 9.7*  HCT 34.1* 31.5* 30.6*  MCV 94.7 95.7 95.9  PLT 339 254 270   Basic Metabolic Panel:  Recent Labs Lab 01/02/16 2230 01/04/16 0639 01/04/16 0745  NA 137 136 136  K 4.1 4.7 4.5  CL 94* 97* 97*  CO2 _0 GLUCOSE 110* 84 81  BUN 25* 40* 40*  CREATININE 6.18* 9.80* 9.93*  CALCIUM 8.4* 7.9* 8.0*  PHOS  --   --  6.1*   GFR: Estimated Creatinine Clearance: 5 mL/min (by C-G formula based on SCr of 9.93 mg/dL). Liver Function Tests:  Recent Labs Lab 01/02/16 2230 01/04/16 0745  AST 15  --   ALT 11*  --   ALKPHOS 118  --   BILITOT  0.2*  --   PROT 7.8  --   ALBUMIN 3.2* 2.6*    Recent Labs Lab 01/02/16 2230  LIPASE 16   No results for input(s): AMMONIA in the last 168 hours. Coagulation Profile: No results for input(s): INR, PROTIME in the last 168 hours. Cardiac Enzymes: No results for input(s): CKTOTAL, CKMB, CKMBINDEX, TROPONINI in the last 168 hours. BNP (last 3 results) No results for input(s): PROBNP in the last 8760 hours. HbA1C: No results for input(s): HGBA1C in the last 72 hours. CBG:  Recent Labs Lab 01/14/2016 2002 01/11/2016 2349 01/04/16 0240 01/04/16 1318 01/04/16 1616  GLUCAP 92 83 97 94 119*   Lipid Profile: No results for input(s): CHOL, HDL, LDLCALC, TRIG, CHOLHDL, LDLDIRECT in the last 72 hours. Thyroid Function Tests: No results for input(s): TSH, T4TOTAL, FREET4, T3FREE, THYROIDAB in the last 72 hours. Anemia Panel: No results for input(s): VITAMINB12, FOLATE, FERRITIN, TIBC, IRON, RETICCTPCT in the last 72 hours. Sepsis Labs:  Recent Labs Lab 01/26/2016 0210 01/04/16 0639  LATICACIDVEN 0.83 0.8    Recent Results (from the past 240 hour(s))  Blood culture (routine x 2)     Status: None (Preliminary result)   Collection Time: 01/20/2016  1:50 AM  Result Value Ref Range Status   Specimen Description BLOOD LEFT ANTECUBITAL  Final   Special Requests BOTTLES DRAWN AEROBIC ONLY 5CC  Final   Culture NO GROWTH 1 DAY  Final   Report Status PENDING  Incomplete  Blood culture (routine x 2)     Status: None (Preliminary result)   Collection Time: 01/24/2016  1:55 AM  Result Value Ref Range Status   Specimen Description BLOOD LEFT HAND  Final   Special Requests BOTTLES DRAWN AEROBIC ONLY 5CC  Final   Culture NO GROWTH 1 DAY  Final   Report Status PENDING  Incomplete         Radiology Studies: Dg Chest 2 View  Result Date: 01/18/2016 CLINICAL DATA:  Cough and fever tonight. EXAM: CHEST  2 VIEW COMPARISON:  01/23/2015 FINDINGS: Mild cardiomegaly. There is atherosclerosis of the  thoracic aorta, unchanged aortic and mediastinal contours. Stable pleural-parenchymal scarring at the left lung base. New linear opacity in the periphery of the right midlung zone. Chronic bronchial thickening is unchanged. No pleural effusion or pneumothorax. A vascular stent in the left axilla. There is degenerative change throughout spine. IMPRESSION: 1. New linear opacity in the right midlung zone likely subsegmental atelectasis or scarring. Stable pleural parenchymal  scarring at the left lung base. 2. Aortic atherosclerosis. Electronically Signed   By: Jeb Levering M.D.   On: 01/21/2016 02:51   Ct Head Wo Contrast  Result Date: 01/04/2016 CLINICAL DATA:  Responsive. Tremor. History of multiple myeloma, hypertension, hypercholesterolemia diabetes. EXAM: CT HEAD WITHOUT CONTRAST TECHNIQUE: Contiguous axial images were obtained from the base of the skull through the vertex without intravenous contrast. COMPARISON:  CT HEAD February 01, 2005 FINDINGS: Moderately motion degraded examination. INTRACRANIAL CONTENTS: The ventricles and sulci are normal for age. No intraparenchymal hemorrhage, mass effect nor midline shift. Patchy supratentorial white matter hypodensities are less than expected for patient's age and though non-specific likely represent chronic small vessel ischemic disease. No acute large vascular territory infarcts. No abnormal extra-axial fluid collections. Basal cisterns are patent. Moderate calcific atherosclerosis of the carotid siphons. ORBITS: The included ocular globes and orbital contents are non-suspicious. SINUSES: The mastoid aircells and included paranasal sinuses are well-aerated. SKULL/SOFT TISSUES: No skull fracture. No significant soft tissue swelling. IMPRESSION: Negative moderately motion degraded CT HEAD for age. Electronically Signed   By: Elon Alas M.D.   On: 01/04/2016 03:46   Ct Abdomen Pelvis W Contrast  Result Date: 01/10/2016 CLINICAL DATA:  82 year old female  with lower abdominal pain EXAM: CT ABDOMEN AND PELVIS WITH CONTRAST TECHNIQUE: Multidetector CT imaging of the abdomen and pelvis was performed using the standard protocol following bolus administration of intravenous contrast. CONTRAST:  1 ISOVUE-300 IOPAMIDOL (ISOVUE-300) INJECTION 61% COMPARISON:  CT dated 09/24/2012 FINDINGS: The visualized lung bases are clear. No intra-abdominal free air. Trace free fluid may be present within the pelvis. Cholecystectomy. Mild biliary ductal dilatation, likely post cholecystectomy. The pancreas is unremarkable. There is a 2 cm hypodense lesion in the spleen which is incompletely characterized but may represent a cyst or hemangioma. There is a 2 cm left adrenal high attenuating or enhancing lesion which is increased in size compared to the prior study. MRI without and with contrast is recommended for further characterization. There is severe bilateral renal atrophy. Small nonobstructing bilateral renal calculi measuring up to 4 mm in the upper pole of the left kidney. There is no hydronephrosis on either side. Bilateral renal upper pole exophytic hypodense lesions are not well characterized but likely represent cysts. The visualized ureters and urinary bladder appear unremarkable. Hysterectomy. There is extensive sigmoid diverticulosis with muscular hypertrophy. There is active inflammatory changes of the sigmoid colon compatible with acute diverticulitis. There is an ill-defined 2.8 x 4.5 cm complex collection with hazy organizing wall extending from the sigmoid colon anteriorly adjacent to the posterior wall of the urinary bladder compatible with diverticular abscess formation. There is no evidence of bowel obstruction. Normal appendix. There is moderate aortoiliac atherosclerotic disease. The origins of the celiac axis, SMA, IMA as well as the origins of the renal arteries are patent. No portal venous gas identified. There is no adenopathy. There is a midline vertical  anterior pelvic wall incisional scar with a small fat containing umbilical hernia. The abdominal wall soft tissues are otherwise unremarkable. There is degenerative changes of the spine with multilevel disc desiccation with vacuum phenomena. No acute fracture. IMPRESSION: Sigmoid diverticulitis with developing diverticular abscess. Enhancing left adrenal lesion. MRI without and with contrast is recommended for further characterization. Electronically Signed   By: Anner Crete M.D.   On: 01/14/2016 05:09        Scheduled Meds: . ciprofloxacin  400 mg Intravenous Q24H  . darbepoetin (ARANESP) injection - DIALYSIS  100 mcg Intravenous  Q Wed-HD  . doxercalciferol  4 mcg Intravenous Q M,W,F-HD  . heparin  5,000 Units Subcutaneous Q8H  . insulin aspart  0-9 Units Subcutaneous Q4H  . insulin detemir  5 Units Subcutaneous Q2200  . levothyroxine  100 mcg Oral QAC breakfast  . metronidazole  500 mg Intravenous Q8H  . morphine   Intravenous Q4H   Continuous Infusions: . dextrose 5 % and 0.45% NaCl 30 mL/hr at 01/06/2016 2030     LOS: 1 day    Time spent: 35 minutes.     Hosie Poisson, MD [facility] Pager [phone]  If 7PM-7AM, please contact night-coverage [email or web address] Password [facility] 01/04/2016, 6:55 PM

## 2016-01-04 NOTE — Procedures (Signed)
Patient was seen on dialysis and the procedure was supervised.  BFR 300  Via AVF BP is  141/62.   Patient appears to be tolerating treatment well  [name] A 01/04/2016

## 2016-01-05 ENCOUNTER — Inpatient Hospital Stay (HOSPITAL_COMMUNITY): Payer: Medicare Other

## 2016-01-05 DIAGNOSIS — I10 Essential (primary) hypertension: Secondary | ICD-10-CM

## 2016-01-05 DIAGNOSIS — R4182 Altered mental status, unspecified: Secondary | ICD-10-CM

## 2016-01-05 LAB — CBC
HEMATOCRIT: 32.4 % — AB (ref 36.0–46.0)
HEMOGLOBIN: 10.6 g/dL — AB (ref 12.0–15.0)
MCH: 30.8 pg (ref 26.0–34.0)
MCHC: 32.7 g/dL (ref 30.0–36.0)
MCV: 94.2 fL (ref 78.0–100.0)
Platelets: 272 10*3/uL (ref 150–400)
RBC: 3.44 MIL/uL — ABNORMAL LOW (ref 3.87–5.11)
RDW: 16 % — ABNORMAL HIGH (ref 11.5–15.5)
WBC: 23.2 10*3/uL — AB (ref 4.0–10.5)

## 2016-01-05 LAB — GLUCOSE, CAPILLARY
GLUCOSE-CAPILLARY: 147 mg/dL — AB (ref 65–99)
GLUCOSE-CAPILLARY: 154 mg/dL — AB (ref 65–99)
Glucose-Capillary: 101 mg/dL — ABNORMAL HIGH (ref 65–99)
Glucose-Capillary: 115 mg/dL — ABNORMAL HIGH (ref 65–99)
Glucose-Capillary: 119 mg/dL — ABNORMAL HIGH (ref 65–99)
Glucose-Capillary: 135 mg/dL — ABNORMAL HIGH (ref 65–99)
Glucose-Capillary: 95 mg/dL (ref 65–99)

## 2016-01-05 MED ORDER — TRAMADOL HCL 50 MG PO TABS: 50.0000 mg | ORAL_TABLET | Freq: Four times a day (QID) | ORAL | Status: DC | PRN

## 2016-01-05 NOTE — Progress Notes (Signed)
Subjective:  HD yesterday- removed close to a liter- tolerated well.  Surgery did see pt rec cont iv antibiotics and if she fails possible surgery- less twitchy today  Objective Vital signs in last 24 hours: Vitals:   01/04/16 2223 01/05/16 0220 01/05/16 0633 01/05/16 0756  BP: (!) 134/40 (!) 128/44 (!) 109/46   Pulse: 75 70 86   Resp: '16 15 15 14  '$ Temp: 100.1 F (37.8 C) 99.8 F (37.7 C) 99.9 F (37.7 C)   TempSrc: Axillary     SpO2: 98% 96% 100% 95%  Weight:      Height:       Weight change: -0.94 kg (-2 lb 1.2 oz)  Intake/Output Summary (Last 24 hours) at 01/05/16 0539 Last data filed at 01/04/16 1654  Gross per 24 hour  Intake                0 ml  Output              998 ml  Net             -998 ml   Dialysis Orders: [facility]   on MWF . EDW 91.5  Bath 2k/ 2.25ca  Time 4hr Heparin 6000. Access RUA AVF BFR450 DFR 800    Hec 4 mcg IV/HD  aranesp 60 q week /HD  Venofer  '50mg'$  q wkkly  Hd   Other op labs  HGB     Assessment/Plan  1. Acute Diverticulitis with  Evolving abscess- admit team Wu /  On Cipro and Flagyl IV IR  eval in process- per primary and surgery /  2. ESRD -  MWF on schdule k and vol okay today  / no hep hd - plan next for tomorrow 3. Jerking- new MS change- HCT negative- could it be narcotics/antibiotics (cipro does sometimes)- I have stopped neurontin because that definitely can do- better   4. Hypertension/volume  - bp higher / as op on Norvasc '5mg'$  / No excess vol on cxr or exam - will follow- now on no antihypertensives 5. Anemia of  ESRD  - hg 10.9 now down to 9.7 continue weekly ESa and fe  6. Metabolic bone disease -  corec ca 9.0  On hec  q hd /  When eating = sensipar and binder   7. MM= "reported in Remission  8. Chronic low back pain=  On pain meds  As op / now PCA MS 9. IDDM= per admit  10. Left adrenal mass /Splenic lesion= on CT  Admit team wu  11. Nutrition - npo for now (Carb mod Neysa Hotter diet when eating  Renal vit  )    Terrelle Ruffolo A    Labs: Basic Metabolic Panel:  Recent Labs Lab 01/02/16 2230 01/04/16 0639 01/04/16 0745  NA 137 136 136  K 4.1 4.7 4.5  CL 94* 97* 97*  CO2 '28 25 26  '$ GLUCOSE 110* 84 81  BUN 25* 40* 40*  CREATININE 6.18* 9.80* 9.93*  CALCIUM 8.4* 7.9* 8.0*  PHOS  --   --  6.1*   Liver Function Tests:  Recent Labs Lab 01/02/16 2230 01/04/16 0745  AST 15  --   ALT 11*  --   ALKPHOS 118  --   BILITOT 0.2*  --   PROT 7.8  --   ALBUMIN 3.2* 2.6*    Recent Labs Lab 01/02/16 2230  LIPASE 16   No results for input(s): AMMONIA in the last 168 hours. CBC:  Recent Labs Lab 01/02/16 2230 01/04/16 0639 01/04/16 0745  WBC 15.1* 18.8* 18.5*  HGB 10.9* 10.0* 9.7*  HCT 34.1* 31.5* 30.6*  MCV 94.7 95.7 95.9  PLT 339 254 286   Cardiac Enzymes: No results for input(s): CKTOTAL, CKMB, CKMBINDEX, TROPONINI in the last 168 hours. CBG:  Recent Labs Lab 01/04/16 1616 01/04/16 1954 01/05/16 0016 01/05/16 0417 01/05/16 0743  GLUCAP 119* 87 95 101* 115*    Iron Studies: No results for input(s): IRON, TIBC, TRANSFERRIN, FERRITIN in the last 72 hours. Studies/Results: Ct Head Wo Contrast  Result Date: 01/04/2016 CLINICAL DATA:  Responsive. Tremor. History of multiple myeloma, hypertension, hypercholesterolemia diabetes. EXAM: CT HEAD WITHOUT CONTRAST TECHNIQUE: Contiguous axial images were obtained from the base of the skull through the vertex without intravenous contrast. COMPARISON:  CT HEAD February 01, 2005 FINDINGS: Moderately motion degraded examination. INTRACRANIAL CONTENTS: The ventricles and sulci are normal for age. No intraparenchymal hemorrhage, mass effect nor midline shift. Patchy supratentorial white matter hypodensities are less than expected for patient's age and though non-specific likely represent chronic small vessel ischemic disease. No acute large vascular territory infarcts. No abnormal extra-axial fluid collections. Basal cisterns are  patent. Moderate calcific atherosclerosis of the carotid siphons. ORBITS: The included ocular globes and orbital contents are non-suspicious. SINUSES: The mastoid aircells and included paranasal sinuses are well-aerated. SKULL/SOFT TISSUES: No skull fracture. No significant soft tissue swelling. IMPRESSION: Negative moderately motion degraded CT HEAD for age. Electronically Signed   By: Elon Alas M.D.   On: 01/04/2016 03:46   Medications: Infusions: . dextrose 5 % and 0.45% NaCl 30 mL/hr at 01/08/2016 2030    Scheduled Medications: . ciprofloxacin  400 mg Intravenous Q24H  . darbepoetin (ARANESP) injection - DIALYSIS  100 mcg Intravenous Q Wed-HD  . doxercalciferol  4 mcg Intravenous Q M,W,F-HD  . heparin  5,000 Units Subcutaneous Q8H  . insulin aspart  0-9 Units Subcutaneous Q4H  . insulin detemir  5 Units Subcutaneous Q2200  . levothyroxine  100 mcg Oral QAC breakfast  . metronidazole  500 mg Intravenous Q8H  . morphine   Intravenous Q4H    have reviewed scheduled and prn medications.  Physical Exam: General:delayed responses but better than yesterday  Heart: RRR Lungs: mostly clear Abdomen: soft Extremities: no edema Dialysis Access: right AVF- patent    01/05/2016,8:26 AM  LOS: 2 days

## 2016-01-05 NOTE — Procedures (Signed)
ELECTROENCEPHALOGRAM REPORT  Date of Study: 01/05/2016  Patient's Name: [name] MRN: [id number] Date of Birth: Mar 29, 1933  Referring Provider: Dr. Hosie Poisson  Clinical History: This is an 82 year old woman with an episode of unresponsiveness  Medications: acetaminophen (TYLENOL) tablet 650 mg  ciprofloxacin (CIPRO) IVPB 400 mg  Darbepoetin Alfa (ARANESP) injection 100 mcg  doxercalciferol (HECTOROL) injection 4 mcg  levothyroxine (SYNTHROID, LEVOTHROID) tablet 100 mcg  metroNIDAZOLE (FLAGYL) IVPB 500 mg   Technical Summary: A multichannel digital EEG recording measured by the international 10-20 system with electrodes applied with paste and impedances below 5000 ohms performed as portable with EKG monitoring in an awake and confused patient.  Hyperventilation and photic stimulation were not performed.  The digital EEG was referentially recorded, reformatted, and digitally filtered in a variety of bipolar and referential montages for optimal display.   Description: The patient is awake and confused during the recording.  There is no clear posterior dominant rhythm. The background consists of a large amount of diffuse  4-5 Hz theta and 2-3 Hz delta slowing with triphasic waves seen.  During drowsiness, there is an increase in theta and delta slowing of the background.  Normal sleep architecture is not seen. Hyperventilation and photic stimulation were not performed. There were occasional sharp transients seen in a generalized fashion that appear artifactual occurring during lower jaw trembling/twitching. There were no epileptiform discharges or electrographic seizures seen.    EKG lead was unremarkable.  Impression: This awake and drowsy EEG is abnormal due to the presence of: 1. Moderate diffuse slowing of the waking background. 2. Triphasic waves  Clinical Correlation of the above findings indicates diffuse cerebral dysfunction that is non-specific in etiology and can be  seen with hypoxic/ischemic injury, toxic/metabolic encephalopathies, or medication effect. Triphasic waves are typically seen with hepatic encephalopathy but may be seen with other metabolic encephalopathies as well. Jaw twitching did not show any epileptiform correlate. Clinical correlation is advised.   Ellouise Newer, M.D.

## 2016-01-05 NOTE — Care Management Important Message (Signed)
Important Message  Patient Details  Name: [name] MRN: [id number] Date of Birth: Oct 19, 1932   Medicare Important Message Given:  Yes    Loann Quill 01/05/2016, 11:02 AM

## 2016-01-05 NOTE — Progress Notes (Signed)
EEG Completed; Results Pending  

## 2016-01-05 NOTE — Progress Notes (Signed)
Patient ID: [name], female   DOB: September 21, 1932, 82 y.o.   MRN: [id number]   LOS: 2 days   Subjective: Feeling ok today, had some confusion last night and this morning but it's getting better according to family. Denies N/V/flatus/BM.   Objective: Vital signs in last 24 hours: Temp:  [99 F (37.2 C)-100.1 F (37.8 C)] 99.9 F (37.7 C) (08/03 5331) Pulse Rate:  [70-86] 86 (08/03 0633) Resp:  [8-16] 15 (08/03 1152) BP: (109-134)/(40-49) 109/46 (08/03 0633) SpO2:  [95 %-100 %] 98 % (08/03 1152) Last BM Date: 01/02/16   Laboratory  CBG (last 3)   Recent Labs  01/05/16 0417 01/05/16 0743 01/05/16 1135  GLUCAP 101* 115* 135*    Physical Exam General appearance: alert and no distress Resp: clear to auscultation bilaterally Cardio: regular rate and rhythm GI: Soft, diminished BS, mild TTP BLQ   Assessment/Plan: Diverticulitis with abscess formation - severe diverticular disease on CT scan, now afebrile with minimal TTP - continue IV cipro/flagyl  - check CBC today but pt very hard stick and may prefer to wait until dialysis tomorrow. I told her/family that was fine though we wouldn't necessarily advance diet until we have that result.  ESRD HTN DM2, insulin dependent Hypothyroidism Multiple myeloma (in remission) Absolute anemia - baseline 10.8 Left adrenal mass/splenic lesion - MRI recommended ID: cipro/flagyl DVT Proph: subcutaneous heparin Code status: DNR Plan: at this time I recommend non-operative management with IV abx. If her white count worsens, she becomes more tender, and is persistently febrile she will likely need reassessment for possible perc drain placement or will need to go to the OR for a wash out and hartmann's procedure.    Lisette Abu, PA-C Pager: [phone] 01/05/2016

## 2016-01-05 NOTE — Progress Notes (Signed)
PROGRESS NOTE    MEGHAN WARSHAWSKY  TFT:[id number] DOB: 08/23/1932 DOA: 01/20/2016 PCP: [name], MD    Brief Narrative:  [name] is a 82 y.o. female with a Past Medical History of Anemia, Asthma, COPD, ESRD on dialysis, GERD, HLD, HTN, Hypothyroid, MM, DM, PE, DM,  who presents with diverticulitis w/ likely early abscess.   Assessment & Plan:   Principal Problem:   Diverticular disease of intestine with perforation and abscess Active Problems:   Diabetes mellitus with complication (HCC)   End stage renal disease ([place])   Multiple myeloma (HCC)   Hypertension   Diabetes mellitus type 2, insulin dependent (HCC)   Absolute anemia   Hypothyroidism   Chronic low back pain   Left adrenal mass (HCC)   Splenic lesion  Acute diverticulitis with evolving abscess : Admitted for IV antibiotics, currently NPO,with sips of water with meds.  IR consulted for aspiration and drain placement, but couldn't do as it was not accesible.  Surgery consulted, recommended watching on IV antibiotics.  Today she is febrile, with worsening pain and worsening leukocytosis.  Await further recommendations from surgery.  Pain control,.    ESRD On HD Further recommendations as per renal.  HD on MWF.   Hypertension:  Controlled.   Diabetes Mellitus: CBG (last 3)   Recent Labs  01/05/16 0743 01/05/16 1135 01/05/16 1545  GLUCAP 115* 135* 119*    On dextrose fluids. Resume SSI.    Hypothyroidism: resume synthroid.   MM: In remission.    Left adrenal mass, increased in size from previous CT scan Will get MRI of the abdomen.    Chronic low back pain:  Currently on mophine pca, plan to resume home oral meds once she is able to take oral..   Confusion,: family at bedside, reports this has been going on  Few days. On the night of 8/1 pt had a brief episode of delirium from fever. Initial CT head negative, EEG done does not show any epileptiform activity.  MRI brain  without contrast ordered for further evaluation.    DVT prophylaxis: (Lovenox) Code Status: DNR Family Communication: discussed with two daughters at bedside.  Disposition Plan: pending further eval.   Consultants:   IR consult   Surgery.    Procedures:   MRI of the brain without contrast.    Antimicrobials:IV ciprofloxacin and IV flagyl 8/1   Subjective: Reports pain in the abdomen today.  Objective: Vitals:   01/05/16 0756 01/05/16 1152 01/05/16 1355 01/05/16 1556  BP:   (!) 109/51   Pulse:   85   Resp: '14 15 14 16  '$ Temp:   (!) 100.4 F (38 C)   TempSrc:   Oral   SpO2: 95% 98% 100%   Weight:      Height:        Intake/Output Summary (Last 24 hours) at 01/05/16 1611 Last data filed at 01/04/16 1654  Gross per 24 hour  Intake                0 ml  Output                0 ml  Net                0 ml   Filed Weights   01/08/2016 0841 01/04/16 0728 01/04/16 1214  Weight: 93.4 kg (206 lb) 92.5 kg (203 lb 14.8 oz) 91.3 kg (201 lb 4.5 oz)    Examination:  General  exam: Appears calm and comfortable , no distress noted Respiratory system: Clear to auscultation. Respiratory effort normal. No wheezing  Or rhonchi Cardiovascular system: S1 & S2 heard, RRR. No JVD, murmurs, rubs, gallops or clicks. No pedal edema. Gastrointestinal system: Abdomen is nondistended, soft , tender in the left lower quadrant today. No organomegaly or masses felt. Normal bowel sounds heard. Central nervous system: Alert but slightly confused. Jerking of the right upper extremity.  Extremities: Symmetric 5 x 5 power. Skin: No rashes, lesions or ulcers     Data Reviewed: I have personally reviewed following labs and imaging studies  CBC:  Recent Labs Lab 01/02/16 2230 01/04/16 0639 01/04/16 0745 01/05/16 1512  WBC 15.1* 18.8* 18.5* 23.2*  HGB 10.9* 10.0* 9.7* 10.6*  HCT 34.1* 31.5* 30.6* 32.4*  MCV 94.7 95.7 95.9 94.2  PLT 339 254 286 366   Basic Metabolic Panel:  Recent  Labs Lab 01/02/16 2230 01/04/16 0639 01/04/16 0745  NA 137 136 136  K 4.1 4.7 4.5  CL 94* 97* 97*  CO2 '28 25 26  '$ GLUCOSE 110* 84 81  BUN 25* 40* 40*  CREATININE 6.18* 9.80* 9.93*  CALCIUM 8.4* 7.9* 8.0*  PHOS  --   --  6.1*   GFR: Estimated Creatinine Clearance: 5 mL/min (by C-G formula based on SCr of 9.93 mg/dL). Liver Function Tests:  Recent Labs Lab 01/02/16 2230 01/04/16 0745  AST 15  --   ALT 11*  --   ALKPHOS 118  --   BILITOT 0.2*  --   PROT 7.8  --   ALBUMIN 3.2* 2.6*    Recent Labs Lab 01/02/16 2230  LIPASE 16   No results for input(s): AMMONIA in the last 168 hours. Coagulation Profile: No results for input(s): INR, PROTIME in the last 168 hours. Cardiac Enzymes: No results for input(s): CKTOTAL, CKMB, CKMBINDEX, TROPONINI in the last 168 hours. BNP (last 3 results) No results for input(s): PROBNP in the last 8760 hours. HbA1C: No results for input(s): HGBA1C in the last 72 hours. CBG:  Recent Labs Lab 01/05/16 0016 01/05/16 0417 01/05/16 0743 01/05/16 1135 01/05/16 1545  GLUCAP 95 101* 115* 135* 119*   Lipid Profile: No results for input(s): CHOL, HDL, LDLCALC, TRIG, CHOLHDL, LDLDIRECT in the last 72 hours. Thyroid Function Tests: No results for input(s): TSH, T4TOTAL, FREET4, T3FREE, THYROIDAB in the last 72 hours. Anemia Panel: No results for input(s): VITAMINB12, FOLATE, FERRITIN, TIBC, IRON, RETICCTPCT in the last 72 hours. Sepsis Labs:  Recent Labs Lab 01/08/2016 0210 01/04/16 0639  LATICACIDVEN 0.83 0.8    Recent Results (from the past 240 hour(s))  Blood culture (routine x 2)     Status: None (Preliminary result)   Collection Time: 01/04/2016  1:50 AM  Result Value Ref Range Status   Specimen Description BLOOD LEFT ANTECUBITAL  Final   Special Requests BOTTLES DRAWN AEROBIC ONLY 5CC  Final   Culture NO GROWTH 2 DAYS  Final   Report Status PENDING  Incomplete  Blood culture (routine x 2)     Status: None (Preliminary result)    Collection Time: 01/16/2016  1:55 AM  Result Value Ref Range Status   Specimen Description BLOOD LEFT HAND  Final   Special Requests BOTTLES DRAWN AEROBIC ONLY 5CC  Final   Culture NO GROWTH 2 DAYS  Final   Report Status PENDING  Incomplete         Radiology Studies: Ct Head Wo Contrast  Result Date: 01/04/2016 CLINICAL DATA:  Responsive.  Tremor. History of multiple myeloma, hypertension, hypercholesterolemia diabetes. EXAM: CT HEAD WITHOUT CONTRAST TECHNIQUE: Contiguous axial images were obtained from the base of the skull through the vertex without intravenous contrast. COMPARISON:  CT HEAD February 01, 2005 FINDINGS: Moderately motion degraded examination. INTRACRANIAL CONTENTS: The ventricles and sulci are normal for age. No intraparenchymal hemorrhage, mass effect nor midline shift. Patchy supratentorial white matter hypodensities are less than expected for patient's age and though non-specific likely represent chronic small vessel ischemic disease. No acute large vascular territory infarcts. No abnormal extra-axial fluid collections. Basal cisterns are patent. Moderate calcific atherosclerosis of the carotid siphons. ORBITS: The included ocular globes and orbital contents are non-suspicious. SINUSES: The mastoid aircells and included paranasal sinuses are well-aerated. SKULL/SOFT TISSUES: No skull fracture. No significant soft tissue swelling. IMPRESSION: Negative moderately motion degraded CT HEAD for age. Electronically Signed   By: Elon Alas M.D.   On: 01/04/2016 03:46        Scheduled Meds: . ciprofloxacin  400 mg Intravenous Q24H  . darbepoetin (ARANESP) injection - DIALYSIS  100 mcg Intravenous Q Wed-HD  . doxercalciferol  4 mcg Intravenous Q M,W,F-HD  . heparin  5,000 Units Subcutaneous Q8H  . insulin aspart  0-9 Units Subcutaneous Q4H  . insulin detemir  5 Units Subcutaneous Q2200  . levothyroxine  100 mcg Oral QAC breakfast  . metronidazole  500 mg Intravenous Q8H    . morphine   Intravenous Q4H   Continuous Infusions: . dextrose 5 % and 0.45% NaCl 30 mL/hr at 02/02/2016 2030     LOS: 2 days    Time spent: 35 minutes.     Hosie Poisson, MD [facility] Pager [phone]  If 7PM-7AM, please contact night-coverage [email or web address] Password [email or web address] 01/05/2016, 4:11 PM

## 2016-01-06 DIAGNOSIS — E039 Hypothyroidism, unspecified: Secondary | ICD-10-CM

## 2016-01-06 LAB — RENAL FUNCTION PANEL
ALBUMIN: 2.2 g/dL — AB (ref 3.5–5.0)
ANION GAP: 13 (ref 5–15)
BUN: 37 mg/dL — ABNORMAL HIGH (ref 6–20)
CALCIUM: 7.6 mg/dL — AB (ref 8.9–10.3)
CO2: 25 mmol/L (ref 22–32)
CREATININE: 8.27 mg/dL — AB (ref 0.44–1.00)
Chloride: 95 mmol/L — ABNORMAL LOW (ref 101–111)
GFR, EST AFRICAN AMERICAN: 5 mL/min — AB (ref >60)
GFR, EST NON AFRICAN AMERICAN: 4 mL/min — AB (ref >60)
Glucose, Bld: 118 mg/dL — ABNORMAL HIGH (ref 65–99)
PHOSPHORUS: 6.2 mg/dL — AB (ref 2.5–4.6)
Potassium: 4.1 mmol/L (ref 3.5–5.1)
SODIUM: 133 mmol/L — AB (ref 135–145)

## 2016-01-06 LAB — GLUCOSE, CAPILLARY
GLUCOSE-CAPILLARY: 106 mg/dL — AB (ref 65–99)
GLUCOSE-CAPILLARY: 148 mg/dL — AB (ref 65–99)
Glucose-Capillary: 126 mg/dL — ABNORMAL HIGH (ref 65–99)
Glucose-Capillary: 139 mg/dL — ABNORMAL HIGH (ref 65–99)

## 2016-01-06 LAB — CBC
HEMATOCRIT: 29.2 % — AB (ref 36.0–46.0)
Hemoglobin: 9.6 g/dL — ABNORMAL LOW (ref 12.0–15.0)
MCH: 30.9 pg (ref 26.0–34.0)
MCHC: 32.9 g/dL (ref 30.0–36.0)
MCV: 93.9 fL (ref 78.0–100.0)
Platelets: 258 10*3/uL (ref 150–400)
RBC: 3.11 MIL/uL — ABNORMAL LOW (ref 3.87–5.11)
RDW: 16.2 % — AB (ref 11.5–15.5)
WBC: 21.9 10*3/uL — AB (ref 4.0–10.5)

## 2016-01-06 MED ORDER — DOXERCALCIFEROL 4 MCG/2ML IV SOLN
INTRAVENOUS | Status: AC
  Filled 2016-01-06: qty 2

## 2016-01-06 MED ORDER — MORPHINE SULFATE (PF) 2 MG/ML IV SOLN
INTRAVENOUS | Status: AC
  Filled 2016-01-06: qty 1

## 2016-01-06 MED ORDER — MORPHINE SULFATE (PF) 2 MG/ML IV SOLN
2.0000 mg | Freq: Once | INTRAVENOUS | Status: AC
  Administered 2016-01-06: 2 mg via INTRAVENOUS

## 2016-01-06 NOTE — Progress Notes (Signed)
Subjective:  Seen on HD - looks much better than the last 2 days- no belly pain Objective Vital signs in last 24 hours: Vitals:   01/06/16 0732 01/06/16 0800 01/06/16 0830 01/06/16 0900  BP: (!) 139/56 (!) 143/59 (!) 155/62 (!) 149/59  Pulse: 75 75 76 77  Resp: 17     Temp:      TempSrc:      SpO2:   100%   Weight:      Height:       Weight change: 3.3 kg (7 lb 4.4 oz)  Intake/Output Summary (Last 24 hours) at 01/06/16 0920 Last data filed at 01/06/16 0600  Gross per 24 hour  Intake              750 ml  Output                0 ml  Net              750 ml   Dialysis Orders: [facility]   on MWF . EDW 91.5  Bath 2k/ 2.25ca  Time 4hr Heparin 6000. Access RUA AVF BFR450 DFR 800    Hec 4 mcg IV/HD  aranesp 60 q week /HD  Venofer  50mg  q wkkly  Hd   Other op labs  HGB     Assessment/Plan  1. Acute Diverticulitis with  Evolving abscess- admit team Wu /  On Cipro and Flagyl IV IR  eval in process- per primary and surgery / - seems better 2. ESRD -  MWF via AVF no hep hd - today on schedule 3. Jerking- new MS change- HCT negative- could it be narcotics/antibiotics (cipro does sometimes)- I have stopped neurontin because that definitely can do- resolved  4. Hypertension/volume  - bp reasonable / as op on Norvasc 5mg  / No excess vol on cxr or exam  now on no antihypertensives 5. Anemia of  ESRD  - hg 10.9 now down to 9.76 continue weekly ESa and fe  6. Metabolic bone disease -  corec ca 9.0  On hec  q hd /  When eating = sensipar and binder   7. MM= "reported in Remission  8. Chronic low back pain=  On pain meds  As op / now PCA MS 9. IDDM= per admit  10. Left adrenal mass /Splenic lesion= on CT  Admit team wu  11. Nutrition - npo for now (Carb mod Neysa Hotter diet when eating  Renal vit )    [name]    Labs: Basic Metabolic Panel:  Recent Labs Lab 01/04/16 0639 01/04/16 0745 01/06/16 0745  NA 136 136 133*  K 4.7 4.5 4.1  CL 97* 97* 95*  CO2 25 26 25    GLUCOSE 84 81 118*  BUN 40* 40* 37*  CREATININE 9.80* 9.93* 8.27*  CALCIUM 7.9* 8.0* 7.6*  PHOS  --  6.1* 6.2*   Liver Function Tests:  Recent Labs Lab 01/02/16 2230 01/04/16 0745 01/06/16 0745  AST 15  --   --   ALT 11*  --   --   ALKPHOS 118  --   --   BILITOT 0.2*  --   --   PROT 7.8  --   --   ALBUMIN 3.2* 2.6* 2.2*    Recent Labs Lab 01/02/16 2230  LIPASE 16   No results for input(s): AMMONIA in the last 168 hours. CBC:  Recent Labs Lab 01/02/16 2230 01/04/16 [id number] 01/04/16 0745 01/05/16 1512 01/06/16 0745  WBC 15.1* 18.8* 18.5* 23.2* 21.9*  HGB 10.9* 10.0* 9.7* 10.6* 9.6*  HCT 34.1* 31.5* 30.6* 32.4* 29.2*  MCV 94.7 95.7 95.9 94.2 93.9  PLT 339 254 286 272 258   Cardiac Enzymes: No results for input(s): CKTOTAL, CKMB, CKMBINDEX, TROPONINI in the last 168 hours. CBG:  Recent Labs Lab 01/05/16 1135 01/05/16 1545 01/05/16 2038 01/05/16 2356 01/06/16 0436  GLUCAP 135* 119* 154* 147* 126*    Iron Studies: No results for input(s): IRON, TIBC, TRANSFERRIN, FERRITIN in the last 72 hours. Studies/Results: Mr Brain Wo Contrast  Result Date: 01/05/2016 CLINICAL DATA:  82 year old hypertensive female with brief episode of delirium from fever. End-stage renal disease on dialysis. Subsequent encounter. EXAM: MRI HEAD WITHOUT CONTRAST TECHNIQUE: Multiplanar, multiecho pulse sequences of the brain and surrounding structures were obtained without intravenous contrast. COMPARISON:  01/04/2016 head CT.  No comparison brain MR. FINDINGS: No acute infarct or intracranial hemorrhage. No MR evidence of herpes encephalitis. Mild chronic microvascular changes. Mild global atrophy without hydrocephalus. No intracranial mass lesion noted on this unenhanced exam. Post lens replacement without acute orbital abnormality. Major intracranial vascular structures are patent. Surgery upper cervical spine. Cervical medullary junction unremarkable. IMPRESSION: No acute intracranial  abnormality.  Please see above. Electronically Signed   By: Genia Del M.D.   On: 01/05/2016 18:45   Medications: Infusions: . dextrose 5 % and 0.45% NaCl 30 mL/hr at 01/06/16 [id number]    Scheduled Medications: . ciprofloxacin  400 mg Intravenous Q24H  . darbepoetin (ARANESP) injection - DIALYSIS  100 mcg Intravenous Q Wed-HD  . doxercalciferol  4 mcg Intravenous Q M,W,F-HD  . heparin  5,000 Units Subcutaneous Q8H  . insulin aspart  0-9 Units Subcutaneous Q4H  . insulin detemir  5 Units Subcutaneous Q2200  . levothyroxine  100 mcg Oral QAC breakfast  . metronidazole  500 mg Intravenous Q8H  . morphine   Intravenous Q4H    have reviewed scheduled and prn medications.  Physical Exam: General:much better- seems normal Heart: RRR Lungs: mostly clear Abdomen: soft Extremities: no edema Dialysis Access: right AVF- patent    01/06/2016,9:20 AM  LOS: 3 days

## 2016-01-06 NOTE — Progress Notes (Signed)
PROGRESS NOTE    [name]  BPZ:[id number] DOB: [birth year] DOA: 01/11/2016 PCP: [name], MD    Brief Narrative:  [name] is a 82 y.o. female with a Past Medical History of Anemia, Asthma, COPD, ESRD on dialysis, GERD, HLD, HTN, Hypothyroid, MM, DM, PE, DM,  who presents with diverticulitis w/ likely early abscess.   Assessment & Plan:   Principal Problem:   Diverticular disease of intestine with perforation and abscess Active Problems:   Diabetes mellitus with complication (HCC)   End stage renal disease ([place])   Multiple myeloma (HCC)   Hypertension   Diabetes mellitus type 2, insulin dependent (HCC)   Absolute anemia   Hypothyroidism   Chronic low back pain   Left adrenal mass (HCC)   Splenic lesion  Acute diverticulitis with evolving abscess : Admitted for IV antibiotics, currently NPO,with sips of water with meds.  IR consulted for aspiration and drain placement, but couldn't do as it was not accesible.  Surgery consulted, recommended watching on IV antibiotics.  Pt refusing any surgery, her abdominal pain improved. No fever today, and her wbc count is improving. Plan for clears today.  Await further recommendations from surgery.  Pain control,.    ESRD On HD Further recommendations as per renal.  HD on MWF.   Hypertension:  Controlled.   Diabetes Mellitus: CBG (last 3)   Recent Labs  01/06/16 0436 01/06/16 1237 01/06/16 1704  GLUCAP 126* 106* 148*    On dextrose fluids. Resume SSI.    Hypothyroidism: resume synthroid.   MM: In remission.    Left adrenal mass, increased in size from previous CT scan MRI of the abdomen couldn't be done as she couldn't hold her breath because of her abdominal pain, deferred the test to later when her diverticulitis is resolved.    Chronic low back pain:  Currently on mophine pca, plan to resume home oral meds once she is able to take oral..   Confusion,: family at bedside, reports  this has been going on  Few days. On the night of 8/1 pt had a brief episode of delirium from fever. Initial CT head negative, EEG done does not show any epileptiform activity.  MRI brain without contrast ordered for further evaluation, which was unremarkable.   Mild hyponatremia;  Monitor. From ESRD.   ANEMIA OF CHRONIC DISEASE; Stable between 9 to 10.    DVT prophylaxis: (Lovenox) Code Status: DNR Family Communication: discussed with  daughter at bedside.  Disposition Plan: pending further eval.   Consultants:   IR consult   Surgery.   NEPHROLOGY   Procedures:   MRI of the brain without contrast.   HD   Antimicrobials:IV ciprofloxacin and IV flagyl 8/1   Subjective: Wanted to know when she can go home.  Objective: Vitals:   01/06/16 1130 01/06/16 1144 01/06/16 1431 01/06/16 1833  BP: (!) 154/60 (!) 145/60 (!) 104/49 (!) 94/40  Pulse: 85 85 79 80  Resp:  14 (!) 22 19  Temp:  98.3 F (36.8 C) 100.2 F (37.9 C) 98.9 F (37.2 C)  TempSrc:  Oral Oral Oral  SpO2: 100% 99% 99% 97%  Weight:  92.1 kg (203 lb 0.7 oz)    Height:        Intake/Output Summary (Last 24 hours) at 01/06/16 1904 Last data filed at 01/06/16 1144  Gross per 24 hour  Intake              750 ml  Output             1250 ml  Net             -500 ml   Filed Weights   01/06/16 0434 01/06/16 0725 01/06/16 1144  Weight: 95.8 kg (211 lb 3.2 oz) 93.3 kg (205 lb 11 oz) 92.1 kg (203 lb 0.7 oz)    Examination:  General exam: Appears calm and comfortable , no distress noted Respiratory system: Clear to auscultation. Respiratory effort normal. No wheezing  Or rhonchi Cardiovascular system: S1 & S2 heard, RRR. No JVD, murmurs, rubs, gallops or clicks. No pedal edema. Gastrointestinal system: Abdomen is nondistended, soft , tender in the left lower quadrant , improved when compared to yesterday.. No organomegaly or masses felt. Normal bowel sounds heard. Central nervous system: Alert and appears  oriented. Toy Care of the right upper extremity is also better.  Extremities: Symmetric 5 x 5 power. Skin: No rashes, lesions or ulcers     Data Reviewed: I have personally reviewed following labs and imaging studies  CBC:  Recent Labs Lab 01/02/16 2230 01/04/16 0639 01/04/16 0745 01/05/16 1512 01/06/16 0745  WBC 15.1* 18.8* 18.5* 23.2* 21.9*  HGB 10.9* 10.0* 9.7* 10.6* 9.6*  HCT 34.1* 31.5* 30.6* 32.4* 29.2*  MCV 94.7 95.7 95.9 94.2 93.9  PLT 339 254 286 272 165   Basic Metabolic Panel:  Recent Labs Lab 01/02/16 2230 01/04/16 0639 01/04/16 0745 01/06/16 0745  NA 137 136 136 133*  K 4.1 4.7 4.5 4.1  CL 94* 97* 97* 95*  CO2 '28 25 26 25  '$ GLUCOSE 110* 84 81 118*  BUN 25* 40* 40* 37*  CREATININE 6.18* 9.80* 9.93* 8.27*  CALCIUM 8.4* 7.9* 8.0* 7.6*  PHOS  --   --  6.1* 6.2*   GFR: Estimated Creatinine Clearance: 6 mL/min (by C-G formula based on SCr of 8.27 mg/dL). Liver Function Tests:  Recent Labs Lab 01/02/16 2230 01/04/16 0745 01/06/16 0745  AST 15  --   --   ALT 11*  --   --   ALKPHOS 118  --   --   BILITOT 0.2*  --   --   PROT 7.8  --   --   ALBUMIN 3.2* 2.6* 2.2*    Recent Labs Lab 01/02/16 2230  LIPASE 16   No results for input(s): AMMONIA in the last 168 hours. Coagulation Profile: No results for input(s): INR, PROTIME in the last 168 hours. Cardiac Enzymes: No results for input(s): CKTOTAL, CKMB, CKMBINDEX, TROPONINI in the last 168 hours. BNP (last 3 results) No results for input(s): PROBNP in the last 8760 hours. HbA1C: No results for input(s): HGBA1C in the last 72 hours. CBG:  Recent Labs Lab 01/05/16 2038 01/05/16 2356 01/06/16 0436 01/06/16 1237 01/06/16 1704  GLUCAP 154* 147* 126* 106* 148*   Lipid Profile: No results for input(s): CHOL, HDL, LDLCALC, TRIG, CHOLHDL, LDLDIRECT in the last 72 hours. Thyroid Function Tests: No results for input(s): TSH, T4TOTAL, FREET4, T3FREE, THYROIDAB in the last 72 hours. Anemia  Panel: No results for input(s): VITAMINB12, FOLATE, FERRITIN, TIBC, IRON, RETICCTPCT in the last 72 hours. Sepsis Labs:  Recent Labs Lab 01/04/2016 0210 01/04/16 0639  LATICACIDVEN 0.83 0.8    Recent Results (from the past 240 hour(s))  Blood culture (routine x 2)     Status: None (Preliminary result)   Collection Time: 01/14/2016  1:50 AM  Result Value Ref Range Status   Specimen Description BLOOD LEFT ANTECUBITAL  Final   Special Requests BOTTLES DRAWN AEROBIC ONLY 5CC  Final   Culture NO GROWTH 3 DAYS  Final   Report Status PENDING  Incomplete  Blood culture (routine x 2)     Status: None (Preliminary result)   Collection Time: 02/02/2016  1:55 AM  Result Value Ref Range Status   Specimen Description BLOOD LEFT HAND  Final   Special Requests BOTTLES DRAWN AEROBIC ONLY 5CC  Final   Culture NO GROWTH 3 DAYS  Final   Report Status PENDING  Incomplete         Radiology Studies: Mr Brain 52 Contrast  Result Date: 01/05/2016 CLINICAL DATA:  82 year old hypertensive female with brief episode of delirium from fever. End-stage renal disease on dialysis. Subsequent encounter. EXAM: MRI HEAD WITHOUT CONTRAST TECHNIQUE: Multiplanar, multiecho pulse sequences of the brain and surrounding structures were obtained without intravenous contrast. COMPARISON:  01/04/2016 head CT.  No comparison brain MR. FINDINGS: No acute infarct or intracranial hemorrhage. No MR evidence of herpes encephalitis. Mild chronic microvascular changes. Mild global atrophy without hydrocephalus. No intracranial mass lesion noted on this unenhanced exam. Post lens replacement without acute orbital abnormality. Major intracranial vascular structures are patent. Surgery upper cervical spine. Cervical medullary junction unremarkable. IMPRESSION: No acute intracranial abnormality.  Please see above. Electronically Signed   By: Genia Del M.D.   On: 01/05/2016 18:45        Scheduled Meds: . ciprofloxacin  400 mg  Intravenous Q24H  . darbepoetin (ARANESP) injection - DIALYSIS  100 mcg Intravenous Q Wed-HD  . doxercalciferol  4 mcg Intravenous Q M,W,F-HD  . heparin  5,000 Units Subcutaneous Q8H  . insulin aspart  0-9 Units Subcutaneous Q4H  . insulin detemir  5 Units Subcutaneous Q2200  . levothyroxine  100 mcg Oral QAC breakfast  . metronidazole  500 mg Intravenous Q8H  . morphine   Intravenous Q4H   Continuous Infusions: . dextrose 5 % and 0.45% NaCl 30 mL/hr at 01/06/16 0233     LOS: 3 days    Time spent: 35 minutes.     Hosie Poisson, MD [facility] Pager [phone]  If 7PM-7AM, please contact night-coverage [email or web address] Password [email or web address] 01/06/2016, 7:04 PM

## 2016-01-06 NOTE — Progress Notes (Signed)
Subjective: She says her stomach feels about the same.  She is a little tender with palpation at times LLQ and sometimes with RLQ, not easily reproducible.   She is soft and has BS.  Objective: Vital signs in last 24 hours: Temp:  [99 F (37.2 C)-100.4 F (38 C)] 99.1 F (37.3 C) (08/04 0725) Pulse Rate:  [74-85] 85 (08/04 1130) Resp:  [14-19] 17 (08/04 0732) BP: (102-171)/(34-62) 154/60 (08/04 1130) SpO2:  [97 %-100 %] 100 % (08/04 1130) FiO2 (%):  [100 %] 100 % (08/03 1556) Weight:  [93.3 kg (205 lb 11 oz)-95.8 kg (211 lb 3.2 oz)] 93.3 kg (205 lb 11 oz) (08/04 0725) Last BM Date: 01/02/16  nothing by mouth recorded Afebrile vital signs are stable WBC is still elevated MR of the brain 80/17 showed no intercranial abnormalities.   Intake/Output from previous day: 08/03 0701 - 08/04 0700 In: 750 [I.V.:450] Out: -  Intake/Output this shift: No intake/output data recorded.  General appearance: alert, cooperative, no distress and just finished dialysis GI: soft, some intermittent tenderness in both lower quadrants, not consistently reproducible.  Soft, not distended with BS  Lab Results:   Recent Labs  01/05/16 1512 01/06/16 0745  WBC 23.2* 21.9*  HGB 10.6* 9.6*  HCT 32.4* 29.2*  PLT 272 258    BMET  Recent Labs  01/04/16 0745 01/06/16 0745  NA 136 133*  K 4.5 4.1  CL 97* 95*  CO2 26 25  GLUCOSE 81 118*  BUN 40* 37*  CREATININE 9.93* 8.27*  CALCIUM 8.0* 7.6*   PT/INR No results for input(s): LABPROT, INR in the last 72 hours.   Recent Labs Lab 01/02/16 2230 01/04/16 0745 01/06/16 0745  AST 15  --   --   ALT 11*  --   --   ALKPHOS 118  --   --   BILITOT 0.2*  --   --   PROT 7.8  --   --   ALBUMIN 3.2* 2.6* 2.2*     Lipase     Component Value Date/Time   LIPASE 16 01/02/2016 2230     Studies/Results: Mr Brain Wo Contrast  Result Date: 01/05/2016 CLINICAL DATA:  82 year old hypertensive female with brief episode of delirium from fever.  End-stage renal disease on dialysis. Subsequent encounter. EXAM: MRI HEAD WITHOUT CONTRAST TECHNIQUE: Multiplanar, multiecho pulse sequences of the brain and surrounding structures were obtained without intravenous contrast. COMPARISON:  01/04/2016 head CT.  No comparison brain MR. FINDINGS: No acute infarct or intracranial hemorrhage. No MR evidence of herpes encephalitis. Mild chronic microvascular changes. Mild global atrophy without hydrocephalus. No intracranial mass lesion noted on this unenhanced exam. Post lens replacement without acute orbital abnormality. Major intracranial vascular structures are patent. Surgery upper cervical spine. Cervical medullary junction unremarkable. IMPRESSION: No acute intracranial abnormality.  Please see above. Electronically Signed   By: Genia Del M.D.   On: 01/05/2016 18:45    Medications: . ciprofloxacin  400 mg Intravenous Q24H  . darbepoetin (ARANESP) injection - DIALYSIS  100 mcg Intravenous Q Wed-HD  . doxercalciferol  4 mcg Intravenous Q M,W,F-HD  . heparin  5,000 Units Subcutaneous Q8H  . insulin aspart  0-9 Units Subcutaneous Q4H  . insulin detemir  5 Units Subcutaneous Q2200  . levothyroxine  100 mcg Oral QAC breakfast  . metronidazole  500 mg Intravenous Q8H  . morphine   Intravenous Q4H  . morphine       . dextrose 5 % and 0.45% NaCl 30  mL/hr at 01/06/16 0233   Assessment/Plan Diverticulitis with abscess formation ESRD HTN DM2, insulin dependent Hypothyroidism Multiple myeloma (in remission) Absolute anemia - baseline 10.8 Left adrenal mass/splenic lesion- MRI recommended FEN:  NPO/IV fluids ID: 1 day of cefepime/vancomycin completed/ now on day 4 cipro/flagyl DVT Proph: subcutaneous heparin Code status: DNR   Plan:  She still doesn't want surgery.  I would consider some clears for comfort and see how she progresses.      LOS: 3 days    Donte Lenzo 01/06/2016 [phone]

## 2016-01-06 NOTE — Procedures (Signed)
Patient was seen on dialysis and the procedure was supervised.  BFR 400  Via AVF BP is  154/58.   Patient appears to be tolerating treatment well  Anhelica Fowers A 01/06/2016

## 2016-01-07 LAB — CBC
HCT: 31.1 % — ABNORMAL LOW (ref 36.0–46.0)
Hemoglobin: 9.8 g/dL — ABNORMAL LOW (ref 12.0–15.0)
MCH: 29 pg (ref 26.0–34.0)
MCHC: 31.5 g/dL (ref 30.0–36.0)
MCV: 92 fL (ref 78.0–100.0)
PLATELETS: 275 10*3/uL (ref 150–400)
RBC: 3.38 MIL/uL — AB (ref 3.87–5.11)
RDW: 16 % — AB (ref 11.5–15.5)
WBC: 18.3 10*3/uL — AB (ref 4.0–10.5)

## 2016-01-07 LAB — GLUCOSE, CAPILLARY
GLUCOSE-CAPILLARY: 119 mg/dL — AB (ref 65–99)
GLUCOSE-CAPILLARY: 135 mg/dL — AB (ref 65–99)
GLUCOSE-CAPILLARY: 95 mg/dL (ref 65–99)
Glucose-Capillary: 120 mg/dL — ABNORMAL HIGH (ref 65–99)
Glucose-Capillary: 219 mg/dL — ABNORMAL HIGH (ref 65–99)
Glucose-Capillary: 83 mg/dL (ref 65–99)

## 2016-01-07 MED ORDER — OXYCODONE HCL 5 MG PO TABS
20.0000 mg | ORAL_TABLET | Freq: Two times a day (BID) | ORAL | Status: DC
  Administered 2016-01-07 – 2016-01-09 (×4): 20 mg via ORAL
  Filled 2016-01-07 (×4): qty 4

## 2016-01-07 MED ORDER — OXYCODONE HCL 20 MG PO TABS: 20.0000 mg | ORAL_TABLET | Freq: Two times a day (BID) | ORAL | Status: DC

## 2016-01-07 MED ORDER — HYDROCODONE-ACETAMINOPHEN 10-325 MG PO TABS: 1.0000 | ORAL_TABLET | Freq: Four times a day (QID) | ORAL | Status: DC | PRN

## 2016-01-07 MED ORDER — MORPHINE SULFATE (PF) 2 MG/ML IV SOLN
1.0000 mg | INTRAVENOUS | Status: DC | PRN
  Administered 2016-01-09: 2 mg via INTRAVENOUS
  Filled 2016-01-07: qty 1

## 2016-01-07 NOTE — Progress Notes (Signed)
Subjective:  Letting her have clear liquids- had temp overnight ? - HD yest- removed 1250- tolerated well at the time but some low BP's today   Objective Vital signs in last 24 hours: Vitals:   01/07/16 0400 01/07/16 0500 01/07/16 0559 01/07/16 0600  BP:      Pulse:   75   Resp: 12  16   Temp:   (!) 101 F (38.3 C) 98.8 F (37.1 C)  TempSrc:   Oral Oral  SpO2: 97%  98%   Weight:  94.6 kg (208 lb 8.9 oz)    Height:       Weight change: -2.5 kg (-5 lb 8.2 oz)  Intake/Output Summary (Last 24 hours) at 01/07/16 [id number] Last data filed at 01/07/16 0300  Gross per 24 hour  Intake              100 ml  Output             1250 ml  Net            -1150 ml   Dialysis Orders: [facility]   on MWF . EDW 91.5  [place]ath 2k/ 2.25ca  Time 4hr Heparin 6000. Access RUA AVF BFR450 DFR 800    Hec 4 mcg IV/HD  aranesp 60 q week /HD  Venofer  50mg  q wkkly  Hd   Other op labs  HGB     Assessment/Plan  1. Acute Diverticulitis with  Evolving abscess- admit team Wu /  On Cipro and Flagyl IV IR  eval in process- per primary and surgery / - seems better 2. ESRD -  MWF via AVF- next for Monday  on schedule 3. Jerking- new MS change- HCT negative- could it be narcotics/antibiotics (cipro does sometimes)- I have stopped neurontin because that definitely can do- resolved MRI neg 4. Hypertension/volume  - bp reasonable /low  as op on Norvasc 5mg  (being held) / No excess vol on cxr or exam  now on no antihypertensives 5. Anemia of  ESRD  - hg 10.9 now down to 9.6 continue weekly ESa and fe  6. Metabolic bone disease -  corec ca 9.0  On hec  q hd /  When eating = sensipar and binder   7. MM= "reported in Remission  8. Chronic low back pain=  On pain meds  As op / now PCA MS 9. IDDM= per admit  10. Left adrenal mass /Splenic lesion= on CT  Admit team wu  11. Nutrition - npo for now (Carb mod Neysa Hotter diet when eating  Renal vit )    Giavanni Zeitlin A    Labs: Basic Metabolic Panel:  Recent  Labs Lab 01/04/16 0639 01/04/16 0745 01/06/16 0745  NA 136 136 133*  K 4.7 4.5 4.1  CL 97* 97* 95*  CO2 25 26 25   GLUCOSE 84 81 118*  BUN 40* 40* 37*  CREATININE 9.80* 9.93* 8.27*  CALCIUM 7.9* 8.0* 7.6*  PHOS  --  6.1* 6.2*   Liver Function Tests:  Recent Labs Lab 01/02/16 2230 01/04/16 0745 01/06/16 0745  AST 15  --   --   ALT 11*  --   --   ALKPHOS 118  --   --   BILITOT 0.2*  --   --   PROT 7.8  --   --   ALBUMIN 3.2* 2.6* 2.2*    Recent Labs Lab 01/02/16 2230  LIPASE 16   No results for input(s): AMMONIA in the last  168 hours. CBC:  Recent Labs Lab 01/02/16 2230 01/04/16 0639 01/04/16 0745 01/05/16 1512 01/06/16 0745  WBC 15.1* 18.8* 18.5* 23.2* 21.9*  HGB 10.9* 10.0* 9.7* 10.6* 9.6*  HCT 34.1* 31.5* 30.6* 32.4* 29.2*  MCV 94.7 95.7 95.9 94.2 93.9  PLT 339 254 286 272 258   Cardiac Enzymes: No results for input(s): CKTOTAL, CKMB, CKMBINDEX, TROPONINI in the last 168 hours. CBG:  Recent Labs Lab 01/06/16 1704 01/06/16 2012 01/07/16 0018 01/07/16 0423 01/07/16 0803  GLUCAP 148* 139* 95 135* 120*    Iron Studies: No results for input(s): IRON, TIBC, TRANSFERRIN, FERRITIN in the last 72 hours. Studies/Results: Mr Brain Wo Contrast  Result Date: 01/05/2016 CLINICAL DATA:  82 year old hypertensive female with brief episode of delirium from fever. End-stage renal disease on dialysis. Subsequent encounter. EXAM: MRI HEAD WITHOUT CONTRAST TECHNIQUE: Multiplanar, multiecho pulse sequences of the brain and surrounding structures were obtained without intravenous contrast. COMPARISON:  01/04/2016 head CT.  No comparison brain MR. FINDINGS: No acute infarct or intracranial hemorrhage. No MR evidence of herpes encephalitis. Mild chronic microvascular changes. Mild global atrophy without hydrocephalus. No intracranial mass lesion noted on this unenhanced exam. Post lens replacement without acute orbital abnormality. Major intracranial vascular structures are  patent. Surgery upper cervical spine. Cervical medullary junction unremarkable. IMPRESSION: No acute intracranial abnormality.  Please see above. Electronically Signed   By: Genia Del M.D.   On: 01/05/2016 18:45   Medications: Infusions: . dextrose 5 % and 0.45% NaCl 30 mL/hr at 01/06/16 [id number]    Scheduled Medications: . ciprofloxacin  400 mg Intravenous Q24H  . darbepoetin (ARANESP) injection - DIALYSIS  100 mcg Intravenous Q Wed-HD  . doxercalciferol  4 mcg Intravenous Q M,W,F-HD  . heparin  5,000 Units Subcutaneous Q8H  . insulin aspart  0-9 Units Subcutaneous Q4H  . insulin detemir  5 Units Subcutaneous Q2200  . levothyroxine  100 mcg Oral QAC breakfast  . metronidazole  500 mg Intravenous Q8H  . morphine   Intravenous Q4H    have reviewed scheduled and prn medications.  Physical Exam: General:much better- some groggy likely due to pain meds  Heart: RRR Lungs: mostly clear Abdomen: soft Extremities: no edema Dialysis Access: right AVF- patent    01/07/2016,9:03 AM  LOS: 4 days

## 2016-01-07 NOTE — Progress Notes (Signed)
PROGRESS NOTE    [name]  EHM:[id number] DOB: 11-21-1932 DOA: 01/31/2016 PCP: [name], MD    Brief Narrative:  [name] is a 82 y.o. female with a Past Medical History of Anemia, Asthma, COPD, ESRD on dialysis, GERD, HLD, HTN, Hypothyroid, MM, DM, PE, DM,  who presents with diverticulitis w/ likely early abscess.   Assessment & Plan:   Principal Problem:   Diverticular disease of intestine with perforation and abscess Active Problems:   Diabetes mellitus with complication (HCC)   End stage renal disease ([place])   Multiple myeloma (HCC)   Hypertension   Diabetes mellitus type 2, insulin dependent (HCC)   Absolute anemia   Hypothyroidism   Chronic low back pain   Left adrenal mass (HCC)   Splenic lesion  Acute diverticulitis with evolving abscess : Admitted for IV antibiotics, initially was NPO, as her pain is improving, leukocytosis improving, started her on clear liquid diet. She was able to tolerate clear liquid diet without nausea ,vomiting or worsening abdominal pain, would not advance her diet yet. Improving leukocytosis. Febrile last night, but clinically she feels better.  IR consulted for aspiration and drain placement, but couldn't do as it was not accesible.  Surgery consulted, recommended watching on IV antibiotics.  Pt refusing any surgery, her abdominal pain improved.  Await further recommendations from surgery.  Pain control,.    ESRD On HD Further recommendations as per renal.  HD on MWF.   Hypertension:  Controlled.   Diabetes Mellitus: CBG (last 3)   Recent Labs  01/07/16 0803 01/07/16 1159 01/07/16 1643  GLUCAP 120* 219* 83    On dextrose fluids. Resume SSI.    Hypothyroidism: resume synthroid.   MM: In remission.    Left adrenal mass, increased in size from previous CT scan MRI of the abdomen couldn't be done as she couldn't hold her breath because of her abdominal pain, deferred the test to later when  her diverticulitis is resolved.    Chronic low back pain:  She was initially started on IV morphine PCA, since she was able to tolerate oral, changed to her home meds and IV morphine as needed.    Confusion,: family at bedside, reports this has been going on  Few days. On the night of 8/1 pt had a brief episode of delirium from fever. Initial CT head negative, EEG done does not show any epileptiform activity.  MRI brain without contrast ordered for further evaluation, which was unremarkable.  Confusion resolved.   Mild hyponatremia;  Monitor. From ESRD.   ANEMIA OF CHRONIC DISEASE; Stable between 9 to 10.    DVT prophylaxis: (Lovenox) Code Status: DNR Family Communication: discussed with  daughter at bedside.  Disposition Plan: pending further eval.   Consultants:   IR consult   Surgery.   NEPHROLOGY   Procedures:   MRI of the brain without contrast.   HD   Antimicrobials:IV ciprofloxacin and IV flagyl 8/1   Subjective: Reports pain is still there.  Objective: Vitals:   01/07/16 0600 01/07/16 0800 01/07/16 0900 01/07/16 1114  BP:   (!) 107/39   Pulse:   72   Resp:  15 15 (!) 9  Temp: 98.8 F (37.1 C)  99 F (37.2 C)   TempSrc: Oral  Oral   SpO2:  99% 97% 97%  Weight:      Height:        Intake/Output Summary (Last 24 hours) at 01/07/16 1758 Last data filed at  01/07/16 1500  Gross per 24 hour  Intake             2470 ml  Output                0 ml  Net             2470 ml   Filed Weights   01/06/16 0725 01/06/16 1144 01/07/16 0500  Weight: 93.3 kg (205 lb 11 oz) 92.1 kg (203 lb 0.7 oz) 94.6 kg (208 lb 8.9 oz)    Examination:  General exam: Appears calm and comfortable , no distress noted Respiratory system: Clear to auscultation. Respiratory effort normal. No wheezing  Or rhonchi Cardiovascular system: S1 & S2 heard, RRR. No JVD, murmurs, rubs, gallops or clicks. No pedal edema. Gastrointestinal system: Abdomen is nondistended, soft , tender  in the left lower quadrant , improved when compared to yesterday.. No organomegaly or masses felt. Normal bowel sounds heard. Central nervous system: Alert and appears oriented. Toy Care of the right upper extremity resolved. Extremities: Symmetric 5 x 5 power. Skin: No rashes, lesions or ulcers     Data Reviewed: I have personally reviewed following labs and imaging studies  CBC:  Recent Labs Lab 01/04/16 0639 01/04/16 0745 01/05/16 1512 01/06/16 0745 01/07/16 1043  WBC 18.8* 18.5* 23.2* 21.9* 18.3*  HGB 10.0* 9.7* 10.6* 9.6* 9.8*  HCT 31.5* 30.6* 32.4* 29.2* 31.1*  MCV 95.7 95.9 94.2 93.9 92.0  PLT 254 286 272 258 076   Basic Metabolic Panel:  Recent Labs Lab 01/02/16 2230 01/04/16 0639 01/04/16 0745 01/06/16 0745  NA 137 136 136 133*  K 4.1 4.7 4.5 4.1  CL 94* 97* 97* 95*  CO2 '28 25 26 25  '$ GLUCOSE 110* 84 81 118*  BUN 25* 40* 40* 37*  CREATININE 6.18* 9.80* 9.93* 8.27*  CALCIUM 8.4* 7.9* 8.0* 7.6*  PHOS  --   --  6.1* 6.2*   GFR: Estimated Creatinine Clearance: 6.1 mL/min (by C-G formula based on SCr of 8.27 mg/dL). Liver Function Tests:  Recent Labs Lab 01/02/16 2230 01/04/16 0745 01/06/16 0745  AST 15  --   --   ALT 11*  --   --   ALKPHOS 118  --   --   BILITOT 0.2*  --   --   PROT 7.8  --   --   ALBUMIN 3.2* 2.6* 2.2*    Recent Labs Lab 01/02/16 2230  LIPASE 16   No results for input(s): AMMONIA in the last 168 hours. Coagulation Profile: No results for input(s): INR, PROTIME in the last 168 hours. Cardiac Enzymes: No results for input(s): CKTOTAL, CKMB, CKMBINDEX, TROPONINI in the last 168 hours. BNP (last 3 results) No results for input(s): PROBNP in the last 8760 hours. HbA1C: No results for input(s): HGBA1C in the last 72 hours. CBG:  Recent Labs Lab 01/07/16 0018 01/07/16 0423 01/07/16 0803 01/07/16 1159 01/07/16 1643  GLUCAP 95 135* 120* 219* 83   Lipid Profile: No results for input(s): CHOL, HDL, LDLCALC, TRIG,  CHOLHDL, LDLDIRECT in the last 72 hours. Thyroid Function Tests: No results for input(s): TSH, T4TOTAL, FREET4, T3FREE, THYROIDAB in the last 72 hours. Anemia Panel: No results for input(s): VITAMINB12, FOLATE, FERRITIN, TIBC, IRON, RETICCTPCT in the last 72 hours. Sepsis Labs:  Recent Labs Lab 01/18/2016 0210 01/04/16 2263  LATICACIDVEN 0.83 0.8    Recent Results (from the past 240 hour(s))  Blood culture (routine x 2)     Status: None (  Preliminary result)   Collection Time: 01/08/2016  1:50 AM  Result Value Ref Range Status   Specimen Description BLOOD LEFT ANTECUBITAL  Final   Special Requests BOTTLES DRAWN AEROBIC ONLY 5CC  Final   Culture NO GROWTH 3 DAYS  Final   Report Status PENDING  Incomplete  Blood culture (routine x 2)     Status: None (Preliminary result)   Collection Time: 01/16/2016  1:55 AM  Result Value Ref Range Status   Specimen Description BLOOD LEFT HAND  Final   Special Requests BOTTLES DRAWN AEROBIC ONLY 5CC  Final   Culture NO GROWTH 3 DAYS  Final   Report Status PENDING  Incomplete         Radiology Studies: Mr Brain 54 Contrast  Result Date: 01/05/2016 CLINICAL DATA:  82 year old hypertensive female with brief episode of delirium from fever. End-stage renal disease on dialysis. Subsequent encounter. EXAM: MRI HEAD WITHOUT CONTRAST TECHNIQUE: Multiplanar, multiecho pulse sequences of the brain and surrounding structures were obtained without intravenous contrast. COMPARISON:  01/04/2016 head CT.  No comparison brain MR. FINDINGS: No acute infarct or intracranial hemorrhage. No MR evidence of herpes encephalitis. Mild chronic microvascular changes. Mild global atrophy without hydrocephalus. No intracranial mass lesion noted on this unenhanced exam. Post lens replacement without acute orbital abnormality. Major intracranial vascular structures are patent. Surgery upper cervical spine. Cervical medullary junction unremarkable. IMPRESSION: No acute intracranial  abnormality.  Please see above. Electronically Signed   By: Genia Del M.D.   On: 01/05/2016 18:45        Scheduled Meds: . ciprofloxacin  400 mg Intravenous Q24H  . darbepoetin (ARANESP) injection - DIALYSIS  100 mcg Intravenous Q Wed-HD  . doxercalciferol  4 mcg Intravenous Q M,W,F-HD  . heparin  5,000 Units Subcutaneous Q8H  . insulin aspart  0-9 Units Subcutaneous Q4H  . insulin detemir  5 Units Subcutaneous Q2200  . levothyroxine  100 mcg Oral QAC breakfast  . metronidazole  500 mg Intravenous Q8H  . oxyCODONE  20 mg Oral Q12H   Continuous Infusions: . dextrose 5 % and 0.45% NaCl 30 mL/hr at 01/06/16 0233     LOS: 4 days    Time spent: 35 minutes.     Hosie Poisson, MD [facility] Pager [phone]  If 7PM-7AM, please contact night-coverage [email or web address] Password [email or web address] 01/07/2016, 5:58 PM

## 2016-01-07 NOTE — Progress Notes (Signed)
Pt temp 101 alert and oriented with multiple blankets, removed some blankets and rechecked the temp 98.8, will continue to monitor.

## 2016-01-08 LAB — GLUCOSE, CAPILLARY
GLUCOSE-CAPILLARY: 112 mg/dL — AB (ref 65–99)
GLUCOSE-CAPILLARY: 122 mg/dL — AB (ref 65–99)
GLUCOSE-CAPILLARY: 74 mg/dL (ref 65–99)
Glucose-Capillary: 132 mg/dL — ABNORMAL HIGH (ref 65–99)
Glucose-Capillary: 125 mg/dL — ABNORMAL HIGH (ref 65–99)
Glucose-Capillary: 212 mg/dL — ABNORMAL HIGH (ref 65–99)

## 2016-01-08 LAB — CULTURE, BLOOD (ROUTINE X 2)
CULTURE: NO GROWTH
Culture: NO GROWTH

## 2016-01-08 NOTE — Progress Notes (Signed)
PROGRESS NOTE    [name]  BTD:[id number] DOB: Jan 18, 1933 DOA: 02/01/2016 PCP: [name], MD    Brief Narrative:  [name] is a 82 y.o. female with a Past Medical History of Anemia, Asthma, COPD, ESRD on dialysis, GERD, HLD, HTN, Hypothyroid, MM, DM, PE, DM,  who presents with diverticulitis w/ likely early abscess.   Assessment & Plan:   Principal Problem:   Diverticular disease of intestine with perforation and abscess Active Problems:   Diabetes mellitus with complication (HCC)   End stage renal disease ([place])   Multiple myeloma (HCC)   Hypertension   Diabetes mellitus type 2, insulin dependent (HCC)   Absolute anemia   Hypothyroidism   Chronic low back pain   Left adrenal mass (HCC)   Splenic lesion  Acute diverticulitis with evolving abscess : Admitted for IV antibiotics, initially was NPO, as her pain is improving, leukocytosis improving, started her on clear liquid diet. She was able to tolerate clear liquid diet without nausea ,vomiting or worsening abdominal pain, would advance her diet to full liquid diet. Improving leukocytosis. t mX OF 100.2. IR consulted for aspiration and drain placement, but couldn't do as it was not accesible.  Surgery consulted, recommended watching on IV antibiotics.  Pt refusing any surgery, her abdominal pain improving.  Await further recommendations from surgery.  Pain control,.    ESRD On HD Further recommendations as per renal.  HD on MWF.   Hypertension:  Controlled.   Diabetes Mellitus: CBG (last 3)   Recent Labs  01/08/16 0754 01/08/16 1241 01/08/16 1602  GLUCAP 125* 212* 122*    On dextrose fluids, which were discontinued as she is able to tolerate liquid diet.Marland Kitchen Resume SSI.    Hypothyroidism: resume synthroid.   MM: In remission.    Left adrenal mass, increased in size from previous CT scan MRI of the abdomen couldn't be done as she couldn't hold her breath because of her abdominal  pain, deferred the test to later when her diverticulitis is resolved.    Chronic low back pain:  She was initially started on IV morphine PCA, since she was able to tolerate oral, changed to her home meds and IV morphine as needed.    Confusion,: family at bedside, reports this has been going on  Few days. On the night of 8/1 pt had a brief episode of delirium from fever. Initial CT head negative, EEG done does not show any epileptiform activity.  MRI brain without contrast ordered for further evaluation, which was unremarkable.  Confusion resolved.   Mild hyponatremia;  Monitor. From ESRD.   ANEMIA OF CHRONIC DISEASE; Stable between 9 to 10.    DVT prophylaxis: (Lovenox) Code Status: DNR Family Communication: none at bedside.  Disposition Plan: pending further eval. Pending PT evaluation.    Consultants:   IR consult   Surgery.   NEPHROLOGY   Procedures:   MRI of the brain without contrast.   HD   Antimicrobials:IV ciprofloxacin and IV flagyl 8/1   Subjective: Reports pain is still there.  Objective: Vitals:   01/07/16 2159 01/08/16 0430 01/08/16 0454 01/08/16 1300  BP: (!) 141/48 (!) 132/48  (!) 124/47  Pulse: 76 71  65  Resp:    18  Temp: 100.2 F (37.9 C) 99 F (37.2 C)    TempSrc: Oral Oral  Oral  SpO2: 94% 95%    Weight:   96.1 kg (211 lb 13.8 oz)   Height:  Intake/Output Summary (Last 24 hours) at 01/08/16 1816 Last data filed at 01/08/16 1503  Gross per 24 hour  Intake              820 ml  Output                0 ml  Net              820 ml   Filed Weights   01/06/16 1144 01/07/16 0500 01/08/16 0454  Weight: 92.1 kg (203 lb 0.7 oz) 94.6 kg (208 lb 8.9 oz) 96.1 kg (211 lb 13.8 oz)    Examination:  General exam: Appears calm and comfortable , no distress noted Respiratory system: Clear to auscultation. Respiratory effort normal. No wheezing  Or rhonchi Cardiovascular system: S1 & S2 heard, RRR. No JVD, murmurs, rubs, gallops or  clicks. No pedal edema. Gastrointestinal system: Abdomen is nondistended, soft , tender in the left lower quadrant , improved when compared to yesterday.. No organomegaly or masses felt. Normal bowel sounds heard. Central nervous system: Alert and appears oriented. Toy Care of the right upper extremity resolved. Extremities: Symmetric 5 x 5 power. Skin: No rashes, lesions or ulcers     Data Reviewed: I have personally reviewed following labs and imaging studies  CBC:  Recent Labs Lab 01/04/16 0639 01/04/16 0745 01/05/16 1512 01/06/16 0745 01/07/16 1043  WBC 18.8* 18.5* 23.2* 21.9* 18.3*  HGB 10.0* 9.7* 10.6* 9.6* 9.8*  HCT 31.5* 30.6* 32.4* 29.2* 31.1*  MCV 95.7 95.9 94.2 93.9 92.0  PLT 254 286 272 258 500   Basic Metabolic Panel:  Recent Labs Lab 01/02/16 2230 01/04/16 0639 01/04/16 0745 01/06/16 0745  NA 137 136 136 133*  K 4.1 4.7 4.5 4.1  CL 94* 97* 97* 95*  CO2 '28 25 26 25  '$ GLUCOSE 110* 84 81 118*  BUN 25* 40* 40* 37*  CREATININE 6.18* 9.80* 9.93* 8.27*  CALCIUM 8.4* 7.9* 8.0* 7.6*  PHOS  --   --  6.1* 6.2*   GFR: Estimated Creatinine Clearance: 6.1 mL/min (by C-G formula based on SCr of 8.27 mg/dL). Liver Function Tests:  Recent Labs Lab 01/02/16 2230 01/04/16 0745 01/06/16 0745  AST 15  --   --   ALT 11*  --   --   ALKPHOS 118  --   --   BILITOT 0.2*  --   --   PROT 7.8  --   --   ALBUMIN 3.2* 2.6* 2.2*    Recent Labs Lab 01/02/16 2230  LIPASE 16   No results for input(s): AMMONIA in the last 168 hours. Coagulation Profile: No results for input(s): INR, PROTIME in the last 168 hours. Cardiac Enzymes: No results for input(s): CKTOTAL, CKMB, CKMBINDEX, TROPONINI in the last 168 hours. BNP (last 3 results) No results for input(s): PROBNP in the last 8760 hours. HbA1C: No results for input(s): HGBA1C in the last 72 hours. CBG:  Recent Labs Lab 01/08/16 0019 01/08/16 0428 01/08/16 0754 01/08/16 1241 01/08/16 1602  GLUCAP 112* 132*  125* 212* 122*   Lipid Profile: No results for input(s): CHOL, HDL, LDLCALC, TRIG, CHOLHDL, LDLDIRECT in the last 72 hours. Thyroid Function Tests: No results for input(s): TSH, T4TOTAL, FREET4, T3FREE, THYROIDAB in the last 72 hours. Anemia Panel: No results for input(s): VITAMINB12, FOLATE, FERRITIN, TIBC, IRON, RETICCTPCT in the last 72 hours. Sepsis Labs:  Recent Labs Lab 01/31/2016 0210 01/04/16 9381  LATICACIDVEN 0.83 0.8    Recent Results (from the past  240 hour(s))  Blood culture (routine x 2)     Status: None (Preliminary result)   Collection Time: 01/19/2016  1:50 AM  Result Value Ref Range Status   Specimen Description BLOOD LEFT ANTECUBITAL  Final   Special Requests BOTTLES DRAWN AEROBIC ONLY 5CC  Final   Culture NO GROWTH 4 DAYS  Final   Report Status PENDING  Incomplete  Blood culture (routine x 2)     Status: None (Preliminary result)   Collection Time: 01/19/2016  1:55 AM  Result Value Ref Range Status   Specimen Description BLOOD LEFT HAND  Final   Special Requests BOTTLES DRAWN AEROBIC ONLY 5CC  Final   Culture NO GROWTH 4 DAYS  Final   Report Status PENDING  Incomplete         Radiology Studies: No results found.      Scheduled Meds: . ciprofloxacin  400 mg Intravenous Q24H  . darbepoetin (ARANESP) injection - DIALYSIS  100 mcg Intravenous Q Wed-HD  . doxercalciferol  4 mcg Intravenous Q M,W,F-HD  . heparin  5,000 Units Subcutaneous Q8H  . insulin aspart  0-9 Units Subcutaneous Q4H  . insulin detemir  5 Units Subcutaneous Q2200  . levothyroxine  100 mcg Oral QAC breakfast  . metronidazole  500 mg Intravenous Q8H  . oxyCODONE  20 mg Oral Q12H   Continuous Infusions:     LOS: 5 days    Time spent: 35 minutes.     Hosie Poisson, MD [facility] Pager [phone]  If 7PM-7AM, please contact night-coverage [email or web address] Password [facility] 01/08/2016, 6:16 PM

## 2016-01-08 NOTE — Progress Notes (Signed)
Subjective:  On  clear liquids- tmax 100.2 -    Objective Vital signs in last 24 hours: Vitals:   01/07/16 1114 01/07/16 2159 01/08/16 0430 01/08/16 0454  BP:  (!) 141/48 (!) 132/48   Pulse:  76 71   Resp: (!) 9     Temp:  100.2 F (37.9 C) 99 F (37.2 C)   TempSrc:  Oral Oral   SpO2: 97% 94% 95%   Weight:    96.1 kg (211 lb 13.8 oz)  Height:       Weight change: 2.8 kg (6 lb 2.8 oz)  Intake/Output Summary (Last 24 hours) at 01/08/16 [id number] Last data filed at 01/08/16 0500  Gross per 24 hour  Intake             2770 ml  Output                0 ml  Net             2770 ml   Dialysis Orders: [facility]   on MWF . EDW 91.5  [place] 2k/ 2.25ca  Time 4hr Heparin 6000. Access RUA AVF BFR450 DFR 800    Hec 4 mcg IV/HD  aranesp 60 q week /HD  Venofer  50mg  q wkkly  Hd   Other op labs  HGB     Assessment/Plan  1. Acute Diverticulitis with  Evolving abscess- admit team Elba Barman /  On Cipro and Flagyl IV IR  eval in process (feel could not get to abscess due to location)- per primary and surgery / - seems better- no surgery planned- possibly advance diet ?  2. ESRD -  MWF via AVF- next for Monday  on schedule 3. Jerking- new MS change- HCT negative/MRI neg- could it be narcotics/antibiotics (cipro does sometimes)- I have stopped neurontin because that definitely can do- is  resolved  4. Hypertension/volume  - bp reasonable /low  as op on Norvasc 5mg  (being held) / No excess vol on cxr or exam   5. Anemia of  ESRD  - hg 10.9 now down to 9.8 continue weekly ESa and fe  6. Metabolic bone disease -  corec ca 9.0  On hect  q hd /  When eating will add back sensipar and binder   7. MM= "reported in Remission  8. Chronic low back pain=  On pain meds  As op / now PCA MS 9. IDDM= per admit  10. Left adrenal mass /Splenic lesion= on CT  Admit team wu  11. Nutrition - npo for now (Carb mod Neysa Hotter diet when eating  Renal vit )    Rylei Codispoti A    Labs: Basic Metabolic  Panel:  Recent Labs Lab 01/04/16 0639 01/04/16 0745 01/06/16 0745  NA 136 136 133*  K 4.7 4.5 4.1  CL 97* 97* 95*  CO2 25 26 25   GLUCOSE 84 81 118*  BUN 40* 40* 37*  CREATININE 9.80* 9.93* 8.27*  CALCIUM 7.9* 8.0* 7.6*  PHOS  --  6.1* 6.2*   Liver Function Tests:  Recent Labs Lab 01/02/16 2230 01/04/16 0745 01/06/16 0745  AST 15  --   --   ALT 11*  --   --   ALKPHOS 118  --   --   BILITOT 0.2*  --   --   PROT 7.8  --   --   ALBUMIN 3.2* 2.6* 2.2*    Recent Labs Lab 01/02/16 2230  LIPASE 16   No  results for input(s): AMMONIA in the last 168 hours. CBC:  Recent Labs Lab 01/04/16 0639 01/04/16 0745 01/05/16 1512 01/06/16 0745 01/07/16 1043  WBC 18.8* 18.5* 23.2* 21.9* 18.3*  HGB 10.0* 9.7* 10.6* 9.6* 9.8*  HCT 31.5* 30.6* 32.4* 29.2* 31.1*  MCV 95.7 95.9 94.2 93.9 92.0  PLT 254 286 272 258 275   Cardiac Enzymes: No results for input(s): CKTOTAL, CKMB, CKMBINDEX, TROPONINI in the last 168 hours. CBG:  Recent Labs Lab 01/07/16 1643 01/07/16 2022 01/08/16 0019 01/08/16 0428 01/08/16 0754  GLUCAP 83 119* 112* 132* 125*    Iron Studies: No results for input(s): IRON, TIBC, TRANSFERRIN, FERRITIN in the last 72 hours. Studies/Results: No results found. Medications: Infusions: . dextrose 5 % and 0.45% NaCl 1 mL (01/08/16 0442)    Scheduled Medications: . ciprofloxacin  400 mg Intravenous Q24H  . darbepoetin (ARANESP) injection - DIALYSIS  100 mcg Intravenous Q Wed-HD  . doxercalciferol  4 mcg Intravenous Q M,W,F-HD  . heparin  5,000 Units Subcutaneous Q8H  . insulin aspart  0-9 Units Subcutaneous Q4H  . insulin detemir  5 Units Subcutaneous Q2200  . levothyroxine  100 mcg Oral QAC breakfast  . metronidazole  500 mg Intravenous Q8H  . oxyCODONE  20 mg Oral Q12H    have reviewed scheduled and prn medications.  Physical Exam: General:"im fine" no pain-  Heart: RRR Lungs: mostly clear Abdomen: soft Extremities: no edema Dialysis Access:  right AVF- patent    01/08/2016,8:29 AM  LOS: 5 days

## 2016-01-09 ENCOUNTER — Inpatient Hospital Stay (HOSPITAL_COMMUNITY): Payer: Medicare Other

## 2016-01-09 LAB — CBC
HEMATOCRIT: 30.6 % — AB (ref 36.0–46.0)
HEMOGLOBIN: 10.2 g/dL — AB (ref 12.0–15.0)
MCH: 30.6 pg (ref 26.0–34.0)
MCHC: 33.3 g/dL (ref 30.0–36.0)
MCV: 91.9 fL (ref 78.0–100.0)
Platelets: 307 10*3/uL (ref 150–400)
RBC: 3.33 MIL/uL — AB (ref 3.87–5.11)
RDW: 16.2 % — ABNORMAL HIGH (ref 11.5–15.5)
WBC: 12.2 10*3/uL — AB (ref 4.0–10.5)

## 2016-01-09 LAB — RENAL FUNCTION PANEL
ANION GAP: 16 — AB (ref 5–15)
Albumin: 2.3 g/dL — ABNORMAL LOW (ref 3.5–5.0)
BUN: 74 mg/dL — ABNORMAL HIGH (ref 6–20)
CHLORIDE: 94 mmol/L — AB (ref 101–111)
CO2: 22 mmol/L (ref 22–32)
Calcium: 8.4 mg/dL — ABNORMAL LOW (ref 8.9–10.3)
Creatinine, Ser: 9.3 mg/dL — ABNORMAL HIGH (ref 0.44–1.00)
GFR, EST AFRICAN AMERICAN: 4 mL/min — AB (ref >60)
GFR, EST NON AFRICAN AMERICAN: 3 mL/min — AB (ref >60)
Glucose, Bld: 102 mg/dL — ABNORMAL HIGH (ref 65–99)
POTASSIUM: 5 mmol/L (ref 3.5–5.1)
Phosphorus: 6.9 mg/dL — ABNORMAL HIGH (ref 2.5–4.6)
Sodium: 132 mmol/L — ABNORMAL LOW (ref 135–145)

## 2016-01-09 LAB — GLUCOSE, CAPILLARY
GLUCOSE-CAPILLARY: 101 mg/dL — AB (ref 65–99)
GLUCOSE-CAPILLARY: 193 mg/dL — AB (ref 65–99)
GLUCOSE-CAPILLARY: 170 mg/dL — AB (ref 65–99)
Glucose-Capillary: 113 mg/dL — ABNORMAL HIGH (ref 65–99)
Glucose-Capillary: 101 mg/dL — ABNORMAL HIGH (ref 65–99)
Glucose-Capillary: 209 mg/dL — ABNORMAL HIGH (ref 65–99)

## 2016-01-09 MED ORDER — CINACALCET HCL 30 MG PO TABS
30.0000 mg | ORAL_TABLET | Freq: Every day | ORAL | Status: DC
  Administered 2016-01-10 – 2016-01-13 (×4): 30 mg via ORAL
  Filled 2016-01-09 (×4): qty 1

## 2016-01-09 MED ORDER — OXYCODONE HCL 5 MG PO TABS
10.0000 mg | ORAL_TABLET | Freq: Two times a day (BID) | ORAL | Status: DC
  Administered 2016-01-10 – 2016-01-14 (×8): 10 mg via ORAL
  Filled 2016-01-09 (×9): qty 2

## 2016-01-09 MED ORDER — HEPARIN SODIUM (PORCINE) 1000 UNIT/ML DIALYSIS
20.0000 [IU]/kg | INTRAMUSCULAR | Status: DC | PRN
  Filled 2016-01-09: qty 2

## 2016-01-09 MED ORDER — HYDROCODONE-ACETAMINOPHEN 5-325 MG PO TABS
1.0000 | ORAL_TABLET | Freq: Four times a day (QID) | ORAL | Status: DC | PRN
  Administered 2016-01-14: 1 via ORAL
  Filled 2016-01-09: qty 1

## 2016-01-09 NOTE — Progress Notes (Signed)
PROGRESS NOTE    [name]  NWG:[id number] DOB: [birth year] DOA: 02/02/2016 PCP: [name], MD    Brief Narrative:  [name] is a 82 y.o. female with a Past Medical History of Anemia, Asthma, COPD, ESRD on dialysis, GERD, HLD, HTN, Hypothyroid, MM, DM, PE, DM,  who presents with diverticulitis w/ likely early abscess.   Assessment & Plan:   Principal Problem:   Diverticular disease of intestine with perforation and abscess Active Problems:   Diabetes mellitus with complication (HCC)   End stage renal disease ([place])   Multiple myeloma (HCC)   Hypertension   Diabetes mellitus type 2, insulin dependent (HCC)   Absolute anemia   Hypothyroidism   Chronic low back pain   Left adrenal mass (HCC)   Splenic lesion  Acute diverticulitis with evolving abscess : Admitted for IV antibiotics, initially was NPO, as her pain is improving, leukocytosis improving, started her on clear liquid diet. She was able to tolerate clear liquid diet without nausea ,vomiting or worsening abdominal pain, would advance her diet to full liquid diet. Improving leukocytosis. IR consulted for aspiration and drain placement, but couldn't do as it was not accesible.  Surgery consulted, recommended watching on IV antibiotics and signed off. Pt refusing any surgery, her abdominal pain improving.  Pain control,.  Plan to advance diet to soft in am, and watch her.   ESRD On HD Further recommendations as per renal.  HD on MWF.   Hypertension:  Controlled.   Diabetes Mellitus: CBG (last 3)   Recent Labs  01/09/16 0859 01/09/16 1303 01/09/16 1622  GLUCAP 101* 209* 193*    On dextrose fluids, which were discontinued as she is able to tolerate liquid diet.Marland Kitchen Resume SSI.    Hypothyroidism: resume synthroid.   MM: In remission.    Left adrenal mass, increased in size from previous CT scan MRI of the abdomen couldn't be done as she couldn't hold her breath because of her  abdominal pain, deferred the test to later when her diverticulitis is resolved.    Chronic low back pain:  She was initially started on IV morphine PCA, since she was able to tolerate oral, changed to her home meds and IV morphine as needed.    Confusion,: family at bedside, reports this has been going on  Few days. On the night of 8/1 pt had a brief episode of delirium from fever. Initial CT head negative, EEG done does not show any epileptiform activity.  MRI brain without contrast ordered for further evaluation, which was unremarkable.  On the morning of 8/7 in HD, pt had a brief episode of altered mental status, o funclear etiology, was able to move all extremities, but responding minimally to questions, ordered a repeat Stat CT head, which was unremarkable.  After 30 minutes, she is at her baseline. Suspect it is probably from the pain medications, decreased the dose of oxycontin and oxycodone.   Mild hyponatremia;  Monitor. From ESRD.   ANEMIA OF CHRONIC DISEASE; Stable between 9 to 10.    DVT prophylaxis: (Lovenox) Code Status: DNR Family Communication: none at bedside.  Disposition Plan: pending further eval. Pending PT evaluation.    Consultants:   IR consult   Surgery.   NEPHROLOGY   Procedures:   MRI of the brain without contrast.   HD   Antimicrobials:IV ciprofloxacin and IV flagyl 8/1   Subjective: Reports no pain, but minimally responding.  Objective: Vitals:   01/09/16 0830 01/09/16 0900 01/09/16  1008 01/09/16 1355  BP: (!) 93/53 (!) 179/68 (!) 155/64 (!) 148/62  Pulse: 77 77 79 90  Resp:  '18 20 18  '$ Temp:  98.6 F (37 C) 98.8 F (37.1 C) 99 F (37.2 C)  TempSrc:  Oral Oral Oral  SpO2:  95% 93% 94%  Weight:  95 kg (209 lb 7 oz)    Height:        Intake/Output Summary (Last 24 hours) at 01/09/16 1848 Last data filed at 01/09/16 0921  Gross per 24 hour  Intake              100 ml  Output              438 ml  Net             -338 ml    Filed Weights   01/08/16 0454 01/09/16 0704 01/09/16 0900  Weight: 96.1 kg (211 lb 13.8 oz) 94.9 kg (209 lb 3.5 oz) 95 kg (209 lb 7 oz)    Examination:  General exam:  Confused, eyes closed, responding minimally.  Respiratory system: Clear to auscultation. Respiratory effort normal. No wheezing  Or rhonchi Cardiovascular system: S1 & S2 heard, RRR. No JVD, murmurs, rubs, gallops or clicks. No pedal edema. Gastrointestinal system: Abdomen is nondistended, soft , tender in the left lower quadrant , improved when compared to yesterday.. No organomegaly or masses felt. Normal bowel sounds heard. Central nervous system: Alert and appears oriented. Toy Care of the right upper extremity resolved. Extremities: Symmetric 5 x 5 power. Skin: No rashes, lesions or ulcers     Data Reviewed: I have personally reviewed following labs and imaging studies  CBC:  Recent Labs Lab 01/04/16 0745 01/05/16 1512 01/06/16 0745 01/07/16 1043 01/09/16 0715  WBC 18.5* 23.2* 21.9* 18.3* 12.2*  HGB 9.7* 10.6* 9.6* 9.8* 10.2*  HCT 30.6* 32.4* 29.2* 31.1* 30.6*  MCV 95.9 94.2 93.9 92.0 91.9  PLT 286 272 258 275 830   Basic Metabolic Panel:  Recent Labs Lab 01/02/16 2230 01/04/16 0639 01/04/16 0745 01/06/16 0745 01/09/16 0715  NA 137 136 136 133* 132*  K 4.1 4.7 4.5 4.1 5.0  CL 94* 97* 97* 95* 94*  CO2 '28 25 26 25 22  '$ GLUCOSE 110* 84 81 118* 102*  BUN 25* 40* 40* 37* 74*  CREATININE 6.18* 9.80* 9.93* 8.27* 9.30*  CALCIUM 8.4* 7.9* 8.0* 7.6* 8.4*  PHOS  --   --  6.1* 6.2* 6.9*   GFR: Estimated Creatinine Clearance: 5.4 mL/min (by C-G formula based on SCr of 9.3 mg/dL). Liver Function Tests:  Recent Labs Lab 01/02/16 2230 01/04/16 0745 01/06/16 0745 01/09/16 0715  AST 15  --   --   --   ALT 11*  --   --   --   ALKPHOS 118  --   --   --   BILITOT 0.2*  --   --   --   PROT 7.8  --   --   --   ALBUMIN 3.2* 2.6* 2.2* 2.3*    Recent Labs Lab 01/02/16 2230  LIPASE 16   No  results for input(s): AMMONIA in the last 168 hours. Coagulation Profile: No results for input(s): INR, PROTIME in the last 168 hours. Cardiac Enzymes: No results for input(s): CKTOTAL, CKMB, CKMBINDEX, TROPONINI in the last 168 hours. BNP (last 3 results) No results for input(s): PROBNP in the last 8760 hours. HbA1C: No results for input(s): HGBA1C in the last 72 hours.  CBG:  Recent Labs Lab 01/09/16 0100 01/09/16 0421 01/09/16 0859 01/09/16 1303 01/09/16 1622  GLUCAP 113* 101* 101* 209* 193*   Lipid Profile: No results for input(s): CHOL, HDL, LDLCALC, TRIG, CHOLHDL, LDLDIRECT in the last 72 hours. Thyroid Function Tests: No results for input(s): TSH, T4TOTAL, FREET4, T3FREE, THYROIDAB in the last 72 hours. Anemia Panel: No results for input(s): VITAMINB12, FOLATE, FERRITIN, TIBC, IRON, RETICCTPCT in the last 72 hours. Sepsis Labs:  Recent Labs Lab 01/21/2016 0210 01/04/16 0639  LATICACIDVEN 0.83 0.8    Recent Results (from the past 240 hour(s))  Blood culture (routine x 2)     Status: None   Collection Time: 01/18/2016  1:50 AM  Result Value Ref Range Status   Specimen Description BLOOD LEFT ANTECUBITAL  Final   Special Requests BOTTLES DRAWN AEROBIC ONLY 5CC  Final   Culture NO GROWTH 5 DAYS  Final   Report Status 01/08/2016 FINAL  Final  Blood culture (routine x 2)     Status: None   Collection Time: 01/28/2016  1:55 AM  Result Value Ref Range Status   Specimen Description BLOOD LEFT HAND  Final   Special Requests BOTTLES DRAWN AEROBIC ONLY 5CC  Final   Culture NO GROWTH 5 DAYS  Final   Report Status 01/08/2016 FINAL  Final         Radiology Studies: Ct Head Wo Contrast  Result Date: 01/09/2016 CLINICAL DATA:  Patient became unresponsive at 9 a.m. this morning during dialysis. EXAM: CT HEAD WITHOUT CONTRAST TECHNIQUE: Contiguous axial images were obtained from the base of the skull through the vertex without intravenous contrast. COMPARISON:  Head CT scan  01/04/2016.  Brain MRI 01/05/2016. FINDINGS: There is no evidence of acute intracranial abnormality including hemorrhage, infarct, mass, mass effect, midline shift or abnormal extra-axial fluid collection. No hydrocephalus or pneumocephalus. The calvarium is intact. Imaged paranasal sinuses and mastoid air cells are clear. Carotid atherosclerosis is noted. IMPRESSION: No acute abnormality. Atherosclerosis. Electronically Signed   By: Inge Rise M.D.   On: 01/09/2016 10:06        Scheduled Meds: . [START ON 01/10/2016] cinacalcet  30 mg Oral Q breakfast  . ciprofloxacin  400 mg Intravenous Q24H  . darbepoetin (ARANESP) injection - DIALYSIS  100 mcg Intravenous Q Wed-HD  . doxercalciferol  4 mcg Intravenous Q M,W,F-HD  . heparin  5,000 Units Subcutaneous Q8H  . insulin aspart  0-9 Units Subcutaneous Q4H  . insulin detemir  5 Units Subcutaneous Q2200  . levothyroxine  100 mcg Oral QAC breakfast  . metronidazole  500 mg Intravenous Q8H  . oxyCODONE  10 mg Oral Q12H   Continuous Infusions:     LOS: 6 days    Time spent: 35 minutes.     Hosie Poisson, MD [facility] Pager [phone]  If 7PM-7AM, please contact night-coverage [email or web address] Password [facility] 01/09/2016, 6:48 PM

## 2016-01-09 NOTE — Progress Notes (Signed)
Subjective:   On  HD with AMS / only  mumbled responses  To questions  Pre HD had complete sentences and was coherent per HD RN =JOY  Objective Vital signs in last 24 hours: Vitals:   01/09/16 0715 01/09/16 0716 01/09/16 0729 01/09/16 0800  BP: 129/60 129/60 (!) 109/53 111/61  Pulse: 68 68 62 63  Resp: 18 18    Temp:      TempSrc:      SpO2:      Weight:      Height:       Weight change:   Physical Exam: General: obese AAF  Lethargic only mumbling  yes or no to questions  / not following requests to grip or move extrem .  Heart: RRR no rub Lungs:  Poor resp effort and grossly CTA Abdomen: soft sl tender epigastric area Extremities: trace bipedal  edema Dialysis Access: right AVF  Patent on HD     OP Dialysis Orders: [facility] MWF. EDW 91.5 Bath 2k/ 2.25caTime 4hrHeparin 6000. Access RUA AVF [id number] 800  Hec 76mcg IV/HD aranesp 60 q week /HD Venofer 50mg  q wkkly Hd  Other op labs HGB  10.9/  pth 409    Problem/Plan: 1.  AMS - admit team wu again with CT brain pend. /// ?? Infect. Vs meds??  On  Oxycodone 20mg  bid /Vs  2. Acute Diverticulitis with Evolving abscess- admit team Wu /  Wbc ct. Is down/On Cipro and Flagyl IV IR eval in process- per primary and surgery / - seems better 3. ESRD - MWF via AVF- k 5.0    on schedule 4. Jerking- new MS change now as above - HCT  Was negative-  Pend now  MRI neg/could it be narcotics/antibiotics (cipro does sometimes)-neurontin  Was stopped past wkend  5. Hypertension/volume -dropped on HD to 93/  Pre was 157/44/  as op on Norvasc 5mg  (being held) /   now on no antihypertensives 6. Anemia of ESRD - hg 10.9 > 9.6  >9.2continue weekly ESa and fe  7. Metabolic bone disease - corec ca 9.7 phos 6.9  On hec q hd / When eating = sensipar and binder tums ex 2 ac   8. MM= "reported in Remission " 9. Chronic low back pain= On pain meds As op / now PCA MS 10. IDDM= per admit  11. Left adrenal mass  /Splenic lesion= on CT Admit team wu  12. Nutrition -  (Carb mod Neysa Hotter diet when eating Renal vit ) when eating    Ernest Haber, PA-C [place] [phone] 01/09/2016,9:24 AM  LOS: 6 days   Pt seen, examined and agree w A/P as above.  Kelly Splinter MD [facility] pager [phone]    cell [phone] 01/09/2016, 4:44 PM    Labs: Basic Metabolic Panel:  Recent Labs Lab 01/04/16 0745 01/06/16 0745 01/09/16 0715  NA 136 133* 132*  K 4.5 4.1 5.0  CL 97* 95* 94*  CO2 26 25 22   GLUCOSE 81 118* 102*  BUN 40* 37* 74*  CREATININE 9.93* 8.27* 9.30*  CALCIUM 8.0* 7.6* 8.4*  PHOS 6.1* 6.2* 6.9*   Liver Function Tests:  Recent Labs Lab 01/02/16 2230 01/04/16 0745 01/06/16 0745 01/09/16 0715  AST 15  --   --   --   ALT 11*  --   --   --   ALKPHOS 118  --   --   --   BILITOT 0.2*  --   --   --  PROT 7.8  --   --   --   ALBUMIN 3.2* 2.6* 2.2* 2.3*    Recent Labs Lab 01/02/16 2230  LIPASE 16   No results for input(s): AMMONIA in the last 168 hours. CBC:  Recent Labs Lab 01/04/16 0745 01/05/16 1512 01/06/16 0745 01/07/16 1043 01/09/16 0715  WBC 18.5* 23.2* 21.9* 18.3* 12.2*  HGB 9.7* 10.6* 9.6* 9.8* 10.2*  HCT 30.6* 32.4* 29.2* 31.1* 30.6*  MCV 95.9 94.2 93.9 92.0 91.9  PLT 286 272 258 275 307   Cardiac Enzymes: No results for input(s): CKTOTAL, CKMB, CKMBINDEX, TROPONINI in the last 168 hours. CBG:  Recent Labs Lab 01/08/16 1241 01/08/16 1602 01/08/16 2054 01/09/16 0100 01/09/16 0421  GLUCAP 212* 122* 74 113* 101*    Studies/Results: No results found. Medications:   . ciprofloxacin  400 mg Intravenous Q24H  . darbepoetin (ARANESP) injection - DIALYSIS  100 mcg Intravenous Q Wed-HD  . doxercalciferol  4 mcg Intravenous Q M,W,F-HD  . heparin  5,000 Units Subcutaneous Q8H  . insulin aspart  0-9 Units Subcutaneous Q4H  . insulin detemir  5 Units Subcutaneous Q2200  . levothyroxine  100 mcg Oral QAC breakfast   . metronidazole  500 mg Intravenous Q8H  . oxyCODONE  20 mg Oral Q12H

## 2016-01-09 NOTE — Progress Notes (Signed)
0900 System clotted and patient blood rinsed back.  Patient non verbal , BP 179/68 from 93/53 at 0830, patient responds to painful stimuli only and does not follow commands.  Blood glucose checked and value was 101.  Rapid response and attending physician called and updated due to status change. Treatment ended per Ernest Haber PA. [phone] Stat head CT ordered, needles removed and hemostasis prior to patient being transported to CT accompanied by Hemodialysis nurse, Rudie Meyer RN.    1010 Patient transported to 6N02 accompanied by Hemodialysis nurse, Rudie Meyer RN.  Patient not responding to voice and following commands, hand grips weak but equal.  Patient having trouble answering orientation questions but attempts.  Bedside report given to Carmelina Peal RN, care assumed.

## 2016-01-09 NOTE — Progress Notes (Signed)
[facility] Progress Note     Subjective: Patient tolerating PO diet. Denies pain. Refusing surgical intervention. VSS - no tachycardia or fever.  Objective: Vital signs in last 24 hours: Temp:  [98.5 F (36.9 C)-99.1 F (37.3 C)] 98.8 F (37.1 C) (08/07 1008) Pulse Rate:  [62-79] 79 (08/07 1008) Resp:  [16-20] 20 (08/07 1008) BP: (93-179)/(47-68) 155/64 (08/07 1008) SpO2:  [93 %-96 %] 93 % (08/07 1008) Weight:  [94.9 kg (209 lb 3.5 oz)-95 kg (209 lb 7 oz)] 95 kg (209 lb 7 oz) (08/07 0900) Last BM Date: 01/05/16  Intake/Output from previous day: 08/06 0701 - 08/07 0700 In: 740 [P.O.:240; IV Piggyback:500] Out: 0  Intake/Output this shift: Total I/O In: 0  Out: 438 [Other:438]  PE: Gen:  Drifting in and out of sleep but arousable, cooperative  Card:  RRR, no M/G/R heard Pulm:  CTA, no W/R/R Abd: Soft, TTP LLQ and RLQ, ND, +BS, no HSM  Lab Results:   Recent Labs  01/07/16 1043 01/09/16 0715  WBC 18.3* 12.2*  HGB 9.8* 10.2*  HCT 31.1* 30.6*  PLT 275 307   BMET  Recent Labs  01/09/16 0715  NA 132*  K 5.0  CL 94*  CO2 22  GLUCOSE 102*  BUN 74*  CREATININE 9.30*  CALCIUM 8.4*   PT/INR No results for input(s): LABPROT, INR in the last 72 hours. CMP     Component Value Date/Time   NA 132 (L) 01/09/2016 0715   NA 136 11/10/2015 0955   K 5.0 01/09/2016 0715   K 3.8 11/10/2015 0955   CL 94 (L) 01/09/2016 0715   CL 98 09/17/2012 1242   CO2 22 01/09/2016 0715   CO2 27 11/10/2015 0955   GLUCOSE 102 (H) 01/09/2016 0715   GLUCOSE 236 (H) 11/10/2015 0955   GLUCOSE 159 (H) 09/17/2012 1242   BUN 74 (H) 01/09/2016 0715   BUN 33.0 (H) 11/10/2015 0955   CREATININE 9.30 (H) 01/09/2016 0715   CREATININE 6.6 (HH) 11/10/2015 0955   CALCIUM 8.4 (L) 01/09/2016 0715   CALCIUM 8.5 11/10/2015 0955   PROT 7.8 01/02/2016 2230   PROT 7.8 11/10/2015 0955   ALBUMIN 2.3 (L) 01/09/2016 0715   ALBUMIN 3.5 11/10/2015 0955   AST 15 01/02/2016 2230   AST 17  11/10/2015 0955   ALT 11 (L) 01/02/2016 2230   ALT 13 11/10/2015 0955   ALKPHOS 118 01/02/2016 2230   ALKPHOS 130 11/10/2015 0955   BILITOT 0.2 (L) 01/02/2016 2230   BILITOT 0.48 11/10/2015 0955   GFRNONAA 3 (L) 01/09/2016 0715   GFRAA 4 (L) 01/09/2016 0715   Lipase     Component Value Date/Time   LIPASE 16 01/02/2016 2230       Studies/Results: Ct Head Wo Contrast  Result Date: 01/09/2016 CLINICAL DATA:  Patient became unresponsive at 9 a.m. this morning during dialysis. EXAM: CT HEAD WITHOUT CONTRAST TECHNIQUE: Contiguous axial images were obtained from the base of the skull through the vertex without intravenous contrast. COMPARISON:  Head CT scan 01/04/2016.  Brain MRI 01/05/2016. FINDINGS: There is no evidence of acute intracranial abnormality including hemorrhage, infarct, mass, mass effect, midline shift or abnormal extra-axial fluid collection. No hydrocephalus or pneumocephalus. The calvarium is intact. Imaged paranasal sinuses and mastoid air cells are clear. Carotid atherosclerosis is noted. IMPRESSION: No acute abnormality. Atherosclerosis. Electronically Signed   By: Inge Rise M.D.   On: 01/09/2016 10:06    Anti-infectives: Anti-infectives    Start  Dose/Rate Route Frequency Ordered Stop   01/04/16 2000  ceFEPIme (MAXIPIME) 2 g in dextrose 5 % 50 mL IVPB  Status:  Discontinued     2 g 100 mL/hr over 30 Minutes Intravenous Every M-W-F (2000) 01/14/2016 0156 01/31/2016 0739   01/04/16 1200  vancomycin (VANCOCIN) IVPB 1000 mg/200 mL premix  Status:  Discontinued     1,000 mg 200 mL/hr over 60 Minutes Intravenous Every M-W-F (Hemodialysis) 01/18/2016 0150 01/26/2016 0739   01/07/2016 1000  ciprofloxacin (CIPRO) IVPB 400 mg     400 mg 200 mL/hr over 60 Minutes Intravenous Every 24 hours 02/02/2016 0743     01/07/2016 0745  metroNIDAZOLE (FLAGYL) IVPB 500 mg     500 mg 100 mL/hr over 60 Minutes Intravenous Every 8 hours 01/13/2016 0743     01/09/2016 0200  vancomycin (VANCOCIN)  2,000 mg in sodium chloride 0.9 % 500 mL IVPB     2,000 mg 250 mL/hr over 120 Minutes Intravenous NOW 01/20/2016 0150 01/10/2016 0750   01/12/2016 0200  ceFEPIme (MAXIPIME) 2 g in dextrose 5 % 50 mL IVPB     2 g 100 mL/hr over 30 Minutes Intravenous  Once 01/18/2016 0155 01/04/2016 0324     Assessment/Plan Diverticulitis with abscess formation - fluid collection that is not drainable per IR - patient refuses surgical intervention - pain, leukocytosis improving (12.2 from 23.2 4 days ago), tolerating PO  ESRD HTN DM2, insulin dependent Hypothyroidism Multiple myeloma (in remission) Absolute anemia - baseline 10.8 Left adrenal mass/splenic lesion- MRI recommended  FEN:  full liquid diet ID: 1 day of cefepime/vancomycin completed/ now on day 7 cipro/flagyl DVT Proph: subcutaneous heparin Code status: DNR  The patient is denying surgical intervention. Still midly tender to palpation of lower abdomen. Continue antibiotics for a total course of 14 days. Full liquid diet is appropriate, with cautious advancement if she continues to improve. If pain worsens, should back off diet again to NPO/clears. We will sign off, as this patient is refusing surgery and will not require surgical intervention. Call with questions/concerns. Thank you.    LOS: 6 days    Jill Alexanders , [facility] 01/09/2016, 12:06 PM Pager: [phone] Consults: [phone] Mon-Fri 7:00 am-4:30 pm Sat-Sun 7:00 am-11:30 am

## 2016-01-09 NOTE — Progress Notes (Signed)
PT Cancellation Note  Patient Details Name: [name] MRN: [id number] DOB: April 22, 1933   Cancelled Treatment:    Reason Eval/Treat Not Completed: Patient at procedure or test/unavailable.  Pt is at HD.  PT will check back later as time allows.   Thanks,    Barbarann Ehlers. [place], [place], DPT [phone]   01/09/2016, 10:17 AM

## 2016-01-09 NOTE — Evaluation (Signed)
Physical Therapy Evaluation Patient Details Name: [name] MRN: [id number] DOB: July 20, 1932 Today's Date: 01/09/2016   History of Present Illness  82 y.o. female admitted to [facility] on 01/30/2016 for lower abdominal pain.  Pt found to have diverticulitis with abscess.  Pt refusing surgical intervention and being treated medically with antibiotics.  Pt with significant PMhx of DM2, PE, neuropathy, HTN, gout, ESRD on HD MWF, COPD, chronic lower back pain, and anemia.  Of note, on 01/09/16 during HD treatment, rapid response was called because pt became lethargic and less responsive.  Stat CT ordered and showed no acute events.    Clinical Impression  Pt was a little more alert and responsive in sitting EOB with PT, but it took max two person assist to safely transition to and from sitting EOB and there was no safe way to attempt standing today.  Per daughter, this is not her normal level of cognition or function.  At this point in time, she would be most appropriate for SNF placement at discharge.   PT to follow acutely for deficits listed below.       Follow Up Recommendations SNF    Equipment Recommendations  Wheelchair (measurements PT);Wheelchair cushion (measurements PT);Hospital bed;Other (comment) (hoyer lift)    Recommendations for Other Services OT consult     Precautions / Restrictions Precautions Precautions: Fall Restrictions Other Position/Activity Restrictions: restricted bil UEs due to h/o bil AV fistulas, BPs being taken in legs.      Mobility  Bed Mobility Overal bed mobility: Needs Assistance;+2 for physical assistance Bed Mobility: Supine to Sit     Supine to sit: Max assist;HOB elevated     General bed mobility comments: HOB maximally elevated, and pt progress both legs EOB, assisted in rotating hips with bed pad and then helped pt sit up from maximally elelvated HOB.  Pt at first resisted movement to sitting, but then assisted a bit. Two people needed to return to  bed, one at trunk and one at legs.                      Ambulation/Gait             General Gait Details: Unable at this time.            Balance Overall balance assessment: Needs assistance Sitting-balance support: Feet supported;Bilateral upper extremity supported Sitting balance-Leahy Scale: Zero Sitting balance - Comments: Up to max assist EOB, as light as mod assist EOB after sitting there for 10 mins or so.  Postural control: Posterior lean;Right lateral lean                                   Pertinent Vitals/Pain Pain Assessment: Faces Faces Pain Scale: Hurts even more Pain Location: bil legs L>R Pain Descriptors / Indicators: Grimacing;Guarding Pain Intervention(s): Limited activity within patient's tolerance;Monitored during session;Repositioned;Other (comment) (RN reports no pain meds given today due to lethargy)    Home Living Family/patient expects to be discharged to:: Private residence Living Arrangements: Children Available Help at Discharge: Family;Available 24 hours/day Type of Home: House Home Access: Level entry     Home Layout: One level Home Equipment: Walker - 4 wheels;Shower seat      Prior Function Level of Independence: Needs assistance   Gait / Transfers Assistance Needed: PTA pt walked independently in the home, used rollator for distances/community ambulation  ADL's / [employer]  Needed: cooks, does her own bathing and dressing           Extremity/Trunk Assessment   Upper Extremity Assessment: Generalized weakness;Difficult to assess due to impaired cognition (pt very stiff bil arms L>R)           Lower Extremity Assessment: RLE deficits/detail;LLE deficits/detail RLE Deficits / Details: right leg was less painful with PROM compared to left leg, however, strength seems about the same and was difficult to assess due to poor ability to follow commands.  LLE Deficits / Details: left leg is more  painful with PROM than right, some active movement noted during functional mobility with bil lower extremities, but not to command, even guarded at times when pain was elicited.   Cervical / Trunk Assessment: Other exceptions  Communication      Cognition Arousal/Alertness: Lethargic Behavior During Therapy: Flat affect Overall Cognitive Status: Impaired/Different from baseline Area of Impairment: Orientation;Attention;Memory;Following commands;Safety/judgement;Awareness;Problem solving Orientation Level: Disoriented to;Person;Time;Place;Situation (doesn't even recognize her daughter) Current Attention Level: Focused (if that, difficult keeping her eyes open, better EOB) Memory: Decreased short-term memory Following Commands: Follows one step commands inconsistently Safety/Judgement: Decreased awareness of safety;Decreased awareness of deficits Awareness: Intellectual Problem Solving: Slow processing;Decreased initiation;Difficulty sequencing;Requires verbal cues;Requires tactile cues General Comments: Pt slightly more alter when positioned EOB, but unable to identify her daughter, unable to tell me where she is, and needed repeated cues to keep her eyes open.         Exercises Other Exercises Other Exercises: PROM to bil ankles, knees, and hips (ankle DF/PF, hip/knee flexion, hip abduction) x 10 each side within ROM that did not elicit groaning.       Assessment/Plan    PT Assessment Patient needs continued PT services  PT Diagnosis Difficulty walking;Abnormality of gait;Generalized weakness;Acute pain;Altered mental status   PT Problem List Decreased strength;Decreased activity tolerance;Decreased balance;Decreased mobility;Decreased cognition;Decreased knowledge of use of DME;Decreased safety awareness;Decreased knowledge of precautions;Obesity;Pain  PT Treatment Interventions DME instruction;Gait training;Stair training;Functional mobility training;Therapeutic  activities;Therapeutic exercise;Balance training;Neuromuscular re-education;Cognitive remediation;Patient/family education   PT Goals (Current goals can be found in the Care Plan section) Acute Rehab PT Goals Patient Stated Goal: wants her mom to return to PLOF/independence PT Goal Formulation: With patient/family Time For Goal Achievement: 01/23/16 Potential to Achieve Goals: Fair    Frequency Min 3X/week           End of Session   Activity Tolerance: Patient limited by fatigue;Patient limited by pain;Patient limited by lethargy Patient left: in bed;with call bell/phone within reach;with family/visitor present Nurse Communication: Mobility status         Time: [id number] PT Time Calculation (min) (ACUTE ONLY): 34 min   Charges:   PT Evaluation $PT Eval Moderate Complexity: 1 Procedure PT Treatments $Therapeutic Activity: 8-22 mins        Loreto Loescher B. [place], [place], DPT [phone]   01/09/2016, 5:34 PM

## 2016-01-10 LAB — GLUCOSE, CAPILLARY
GLUCOSE-CAPILLARY: 95 mg/dL (ref 65–99)
Glucose-Capillary: 153 mg/dL — ABNORMAL HIGH (ref 65–99)
Glucose-Capillary: 88 mg/dL (ref 65–99)
Glucose-Capillary: 96 mg/dL (ref 65–99)
Glucose-Capillary: 96 mg/dL (ref 65–99)

## 2016-01-10 MED ORDER — WHITE PETROLATUM GEL
Status: AC
  Filled 2016-01-10: qty 1

## 2016-01-10 MED ORDER — INSULIN ASPART 100 UNIT/ML ~~LOC~~ SOLN
0.0000 [IU] | Freq: Three times a day (TID) | SUBCUTANEOUS | Status: DC
  Administered 2016-01-12 – 2016-01-14 (×7): 1 [IU] via SUBCUTANEOUS

## 2016-01-10 NOTE — Progress Notes (Signed)
PROGRESS NOTE    [name]  PVX:[id number] DOB: 02/13/1933 DOA: 01/16/2016 PCP: [name], MD    Brief Narrative:  [name] is a 82 y.o. female with a Past Medical History of Anemia, Asthma, COPD, ESRD on dialysis, GERD, HLD, HTN, Hypothyroid, MM, DM, PE, DM,  who presents with diverticulitis w/ likely early abscess.   Assessment & Plan:   Principal Problem:   Diverticular disease of intestine with perforation and abscess Active Problems:   Diabetes mellitus with complication (HCC)   End stage renal disease ([place])   Multiple myeloma (HCC)   Hypertension   Diabetes mellitus type 2, insulin dependent (HCC)   Absolute anemia   Hypothyroidism   Chronic low back pain   Left adrenal mass (HCC)   Splenic lesion  Acute diverticulitis with evolving abscess : Admitted for IV antibiotics, initially was NPO, as her pain is improving, leukocytosis improving, started her on clear liquid diet. She was able to tolerate clear liquid diet without nausea ,vomiting or worsening abdominal pain, advanced her diet to full liquid diet> soft diet .  Improving leukocytosis. IR consulted for aspiration and drain placement, but couldn't do as it was not accesible.  Surgery consulted, recommended watching on IV antibiotics and signed off. Pt refusing any surgery, her abdominal pain improving.  Pain control,.     ESRD On HD Further recommendations as per renal.  HD on MWF.   Hypertension:  Controlled.   Diabetes Mellitus: CBG (last 3)   Recent Labs  01/10/16 0412 01/10/16 0753 01/10/16 1133  GLUCAP 88 96 153*    On dextrose fluids, which were discontinued as she is able to tolerate liquid diet.Marland Kitchen Resume SSI.    Hypothyroidism: resume synthroid.   MM: In remission.    Left adrenal mass, increased in size from previous CT scan MRI of the abdomen couldn't be done as she couldn't hold her breath because of her abdominal pain, deferred the test to later when  her diverticulitis is resolved.  Please obtain an MRI of the abdomen in am, hopefully she will be able to hold her breath.    Chronic low back pain:  She was initially started on IV morphine PCA, since she was able to tolerate oral, changed to her home meds and IV morphine as needed.   Decreased the dose of oxycodone to 10 mg twice daily.   Acute Encephalopathy: resolved.   On the night of 8/1 pt had a brief episode of delirium from fever. Initial CT head negative, EEG done does not show any epileptiform activity.  MRI brain without contrast ordered for further evaluation, which was unremarkable.  On the morning of 8/7 in HD, pt had a brief episode of altered mental status, of unclear etiology, was able to move all extremities, but responding minimally to questions, ordered a repeat Stat CT head, which was unremarkable.  After 30 minutes, she is at her baseline. Suspect it is probably from the pain medications, decreased the dose of oxycontin and oxycodone.  No more episodes.   Mild hyponatremia;  Monitor. From ESRD.   Anemia of chronic disease Stable between 9 to 10.    DVT prophylaxis: (Lovenox) Code Status: DNR Family Communication: none at bedside.  Disposition Plan: SNF when stable.    Consultants:   IR consult   Surgery.   Nephrology   Procedures:   MRI of the brain without contrast.   HD   Antimicrobials:IV ciprofloxacin and IV flagyl 8/1   Subjective:  No new complaints.   Objective: Vitals:   01/09/16 2217 01/09/16 2233 01/10/16 0537 01/10/16 1433  BP: (!) 105/24 (!) 100/50 122/68 125/70  Pulse: (!) 50 62 66 68  Resp: _0 Temp: 98.5 F (36.9 C)  98.2 F (36.8 C) 98 F (36.7 C)  TempSrc: Axillary  Axillary Oral  SpO2: 96%  92% 94%  Weight:   96.5 kg (212 lb 11.9 oz)   Height:        Intake/Output Summary (Last 24 hours) at 01/10/16 1450 Last data filed at 01/10/16 1300  Gross per 24 hour  Intake              220 ml  Output                 0 ml  Net              220 ml   Filed Weights   01/09/16 0704 01/09/16 0900 01/10/16 0537  Weight: 94.9 kg (209 lb 3.5 oz) 95 kg (209 lb 7 oz) 96.5 kg (212 lb 11.9 oz)    Examination:  General exam:  Alert and oriented. Comfortable.  Respiratory system: Clear to auscultation. Respiratory effort normal. No wheezing  Or rhonchi Cardiovascular system: S1 & S2 heard, RRR. No JVD, murmurs, rubs, gallops or clicks. No pedal edema. Gastrointestinal system: Abdomen is nondistended, soft , mild gen tenderness,.. No organomegaly or masses felt. Normal bowel sounds heard. Central nervous system: Alert and appears oriented. [name] of the right upper extremity resolved. Extremities:  Trace pedal edema Skin: No rashes, lesions or ulcers     Data Reviewed: I have personally reviewed following labs and imaging studies  CBC:  Recent Labs Lab 01/04/16 0745 01/05/16 1512 01/06/16 0745 01/07/16 1043 01/09/16 0715  WBC 18.5* 23.2* 21.9* 18.3* 12.2*  HGB 9.7* 10.6* 9.6* 9.8* 10.2*  HCT 30.6* 32.4* 29.2* 31.1* 30.6*  MCV 95.9 94.2 93.9 92.0 91.9  PLT 286 272 258 275 563   Basic Metabolic Panel:  Recent Labs Lab 01/04/16 0639 01/04/16 0745 01/06/16 0745 01/09/16 0715  NA 136 136 133* 132*  K 4.7 4.5 4.1 5.0  CL 97* 97* 95* 94*  CO2 _1 GLUCOSE 84 81 118* 102*  BUN 40* 40* 37* 74*  CREATININE 9.80* 9.93* 8.27* 9.30*  CALCIUM 7.9* 8.0* 7.6* 8.4*  PHOS  --  6.1* 6.2* 6.9*   GFR: Estimated Creatinine Clearance: 5.5 mL/min (by C-G formula based on SCr of 9.3 mg/dL). Liver Function Tests:  Recent Labs Lab 01/04/16 0745 01/06/16 0745 01/09/16 0715  ALBUMIN 2.6* 2.2* 2.3*   No results for input(s): LIPASE, AMYLASE in the last 168 hours. No results for input(s): AMMONIA in the last 168 hours. Coagulation Profile: No results for input(s): INR, PROTIME in the last 168 hours. Cardiac Enzymes: No results for input(s): CKTOTAL, CKMB, CKMBINDEX, TROPONINI in the last  168 hours. BNP (last 3 results) No results for input(s): PROBNP in the last 8760 hours. HbA1C: No results for input(s): HGBA1C in the last 72 hours. CBG:  Recent Labs Lab 01/09/16 1950 01/10/16 0034 01/10/16 0412 01/10/16 0753 01/10/16 1133  GLUCAP 170* 96 88 96 153*   Lipid Profile: No results for input(s): CHOL, HDL, LDLCALC, TRIG, CHOLHDL, LDLDIRECT in the last 72 hours. Thyroid Function Tests: No results for input(s): TSH, T4TOTAL, FREET4, T3FREE, THYROIDAB in the last 72 hours. Anemia Panel: No results for input(s): VITAMINB12, FOLATE, FERRITIN, TIBC, IRON, RETICCTPCT in  the last 72 hours. Sepsis Labs:  Recent Labs Lab 01/04/16 0315  LATICACIDVEN 0.8    Recent Results (from the past 240 hour(s))  Blood culture (routine x 2)     Status: None   Collection Time: 01/16/2016  1:50 AM  Result Value Ref Range Status   Specimen Description BLOOD LEFT ANTECUBITAL  Final   Special Requests BOTTLES DRAWN AEROBIC ONLY 5CC  Final   Culture NO GROWTH 5 DAYS  Final   Report Status 01/08/2016 FINAL  Final  Blood culture (routine x 2)     Status: None   Collection Time: 01/09/2016  1:55 AM  Result Value Ref Range Status   Specimen Description BLOOD LEFT HAND  Final   Special Requests BOTTLES DRAWN AEROBIC ONLY 5CC  Final   Culture NO GROWTH 5 DAYS  Final   Report Status 01/08/2016 FINAL  Final         Radiology Studies: Ct Head Wo Contrast  Result Date: 01/09/2016 CLINICAL DATA:  Patient became unresponsive at 9 a.m. this morning during dialysis. EXAM: CT HEAD WITHOUT CONTRAST TECHNIQUE: Contiguous axial images were obtained from the base of the skull through the vertex without intravenous contrast. COMPARISON:  Head CT scan 01/04/2016.  Brain MRI 01/05/2016. FINDINGS: There is no evidence of acute intracranial abnormality including hemorrhage, infarct, mass, mass effect, midline shift or abnormal extra-axial fluid collection. No hydrocephalus or pneumocephalus. The calvarium is  intact. Imaged paranasal sinuses and mastoid air cells are clear. Carotid atherosclerosis is noted. IMPRESSION: No acute abnormality. Atherosclerosis. Electronically Signed   By: Inge Rise M.D.   On: 01/09/2016 10:06        Scheduled Meds: . cinacalcet  30 mg Oral Q breakfast  . ciprofloxacin  400 mg Intravenous Q24H  . darbepoetin (ARANESP) injection - DIALYSIS  100 mcg Intravenous Q Wed-HD  . doxercalciferol  4 mcg Intravenous Q M,W,F-HD  . heparin  5,000 Units Subcutaneous Q8H  . insulin aspart  0-9 Units Subcutaneous Q4H  . insulin detemir  5 Units Subcutaneous Q2200  . levothyroxine  100 mcg Oral QAC breakfast  . metronidazole  500 mg Intravenous Q8H  . oxyCODONE  10 mg Oral Q12H  . white petrolatum       Continuous Infusions:     LOS: 7 days    Time spent: 35 minutes.     Hosie Poisson, MD [facility] Pager [phone]  If 7PM-7AM, please contact night-coverage [email or web address] Password [email or web address] 01/10/2016, 2:50 PM

## 2016-01-10 NOTE — Evaluation (Signed)
Occupational Therapy Evaluation Patient Details Name: [name] MRN: [id number] DOB: 1932-08-09 Today's Date: 01/10/2016    History of Present Illness 82 y.o. female admitted to [facility] on 01/08/2016 for lower abdominal pain.  Pt found to have diverticulitis with abscess.  Pt refusing surgical intervention and being treated medically with antibiotics.  Pt with significant PMhx of DM2, PE, neuropathy, HTN, gout, ESRD on HD MWF, COPD, chronic lower back pain, and anemia.  Of note, on 01/09/16 during HD treatment, rapid response was called because pt became lethargic and less responsive.  Stat CT ordered and showed no acute events.     Clinical Impression   This 82 yo female admitted with above presents to acute OT with deficits below (see OT problem list) thus affecting her PLOF. She will benefit from acute OT with follow up OT at SNF to work back towards PLOF.    Follow Up Recommendations  SNF    Equipment Recommendations  Other (comment) (TBD next venue)       Precautions / Restrictions Precautions Precautions: Fall Restrictions Weight Bearing Restrictions: No Other Position/Activity Restrictions: restricted bil UEs due to h/o bil AV fistulas, BPs being taken in legs.      Mobility Bed Mobility Overal bed mobility: Needs Assistance Bed Mobility: Supine to Sit     Supine to sit: Max assist;+2 for physical assistance;HOB elevated        Transfers Overall transfer level: Needs assistance                Maxi move    Balance Overall balance assessment: Needs assistance Sitting-balance support: Feet supported;Bilateral upper extremity supported   Sitting balance - Comments: fair to poor (when given chair arm in front of her she was able to improve her balance while she had ahold of recliner arm) Postural control: Posterior lean                                  ADL Overall ADL's : Needs assistance/impaired Eating/Feeding: Maximal assistance;Sitting    Grooming: Set up;Supervision/safety;Sitting (with Mod A for sitting balance during this dynamic activity)   Upper Body Bathing: Maximal assistance;Bed level   Lower Body Bathing: Total assistance;Bed level   Upper Body Dressing : Total assistance;Bed level   Lower Body Dressing: Total assistance;Bed level     Toilet Transfer Details (indicate cue type and reason): Used maxi move today for pt OOB due to decreased pt balance, following commands, and obesity                 Vision Additional Comments: Did not assess          Pertinent Vitals/Pain Pain Assessment: Faces Faces Pain Scale: Hurts even more Pain Location: Bil lower legs when helping her to raise them (reports it is her calves due to her DM) Pain Descriptors / Indicators: Grimacing;Guarding Pain Intervention(s): Limited activity within patient's tolerance;Monitored during session;Repositioned     Hand Dominance  right   Extremity/Trunk Assessment Upper Extremity Assessment Upper Extremity Assessment: Generalized weakness           Communication  slow to respond at times    Cognition Arousal/Alertness: Awake/alert Behavior During Therapy: Flat affect Overall Cognitive Status: Impaired/Different from baseline Area of Impairment: Following commands;Safety/judgement;Problem solving       Following Commands: Follows one step commands inconsistently (and increased time when she does follow them) Safety/Judgement: Decreased awareness of deficits;Decreased awareness of safety  Problem Solving: Slow processing;Decreased initiation;Difficulty sequencing;Requires verbal cues;Requires tactile cues                Home Living Family/patient expects to be discharged to:: Skilled nursing facility                                             OT Diagnosis: Generalized weakness;Cognitive deficits;Acute pain   OT Problem List: Decreased strength;Decreased range of motion;Decreased  activity tolerance;Decreased coordination;Impaired balance (sitting and/or standing);Decreased cognition;Decreased safety awareness;Obesity;Pain   OT Treatment/Interventions: Self-care/ADL training;Patient/family education;Cognitive remediation/compensation;DME and/or AE instruction;Therapeutic activities;Balance training    OT Goals(Current goals can be found in the care plan section) Acute Rehab OT Goals Patient Stated Goal: pt agreeable to get up OOB and into recliner today--other than that no goals stated OT Goal Formulation: With patient  OT Frequency: Min 2X/week   Barriers to D/C: Decreased caregiver support             End of Session Equipment Utilized During Treatment:  (maxi move from sitting EOB) Nurse Communication: Mobility status (RN got maxi move for [place] and NT assisted with use of lift for OOB)  Activity Tolerance: Patient tolerated treatment well Patient left: in chair;with call bell/phone within reach;with chair alarm set   Time: [id number] OT Time Calculation (min): 23 min Charges:  OT General Charges $OT Visit: 1 Procedure OT Evaluation $OT Eval Moderate Complexity: 1 Procedure OT Treatments $Therapeutic Activity: 8-22 mins  Almon Register [id number] 01/10/2016, 12:51 PM

## 2016-01-10 NOTE — Care Management Important Message (Signed)
Important Message  Patient Details  Name: [name] MRN: [id number] Date of Birth: 1933/01/25   Medicare Important Message Given:  Yes    Loann Quill 01/10/2016, 8:41 AM

## 2016-01-10 NOTE — NC FL2 (Signed)
[place] MEDICAID FL2 LEVEL OF CARE SCREENING TOOL     IDENTIFICATION  Patient Name: [name] Birthdate: 03-24-1933 Sex: female Admission Date (Current Location): 01/07/2016  [facility] and [place] Number:  Herbalist and Address:  [facility] [place], [place] [address] [place], [place] [address]      Provider Number: [id number]  Attending Physician Name and Address:  Hosie Poisson, MD  Relative Name and Phone Number:       Current Level of Care: Hospital Recommended Level of Care: [place] Prior Approval Number:    Date Approved/Denied:   PASRR Number: [id number]  Discharge Plan: SNF    Current Diagnoses: Patient Active Problem List   Diagnosis Date Noted  . Diverticular disease of intestine with perforation and abscess 01/06/2016  . Hypothyroidism 01/25/2016  . Chronic low back pain 01/23/2016  . Left adrenal mass ([place]) 01/30/2016  . Splenic lesion 01/26/2016  . Multiple myeloma not having achieved remission ([place]) 08/18/2015  . Hypertension 11/09/2013  . Acid reflux 11/09/2013  . Diabetes mellitus type 2, insulin dependent ([place]) 11/09/2013  . CAFL (chronic airflow limitation) ([place]) 11/09/2013  . Arthritis 11/09/2013  . Airway hyperreactivity 11/09/2013  . Absolute anemia 11/09/2013  . Multiple myeloma ([place]) 06/22/2013  . End stage renal disease ([place]) 05/26/2013  . Other complications due to renal dialysis device, implant, and graft 05/26/2013  . Abdominal pain, acute 09/24/2012  . Diabetes mellitus with complication ([place]) 40/03/2724  . MGUS (monoclonal gammopathy of unknown significance) 03/19/2012    Orientation RESPIRATION BLADDER Height & Weight     Self, Time, Situation, Place  Normal Continent Weight: 212 lb 11.9 oz (96.5 kg) Height:  _0  (165.1 cm)  BEHAVIORAL SYMPTOMS/MOOD NEUROLOGICAL BOWEL NUTRITION STATUS      Continent Diet (Full Liquid with plans to advance as tolerated)  AMBULATORY STATUS  COMMUNICATION OF NEEDS Skin   Extensive Assist Verbally Normal                       Personal Care Assistance Level of Assistance  Bathing, Feeding, Dressing Bathing Assistance: Limited assistance Feeding assistance: Independent Dressing Assistance: Limited assistance     Functional Limitations Info  Sight, Hearing, Speech Sight Info: Impaired Hearing Info: Impaired Speech Info: Impaired    SPECIAL CARE FACTORS FREQUENCY  PT (By licensed PT), OT (By licensed OT)     PT Frequency: 3 OT Frequency: 3            Contractures Contractures Info: Not present    Additional Factors Info  Code Status, Allergies, Insulin Sliding Scale Code Status Info: DNR Allergies Info: Penicillins Cross Reactors   Insulin Sliding Scale Info: Novolog Every 4 hours       Current Medications (01/10/2016):  This is the current hospital active medication list Current Facility-Administered Medications  Medication Dose Route Frequency Provider Last Rate Last Dose  . white petrolatum (VASELINE) gel           . acetaminophen (TYLENOL) suppository 650 mg  650 mg Rectal Q4H PRN Rhetta Mura Schorr, NP   650 mg at 01/04/16 0300  . acetaminophen (TYLENOL) tablet 650 mg  650 mg Oral Q4H PRN Samella Parr, NP   650 mg at 01/05/16 1406  . albuterol (PROVENTIL) (2.5 MG/3ML) 0.083% nebulizer solution 2.5 mg  2.5 mg Nebulization BID PRN Samella Parr, NP      . cinacalcet (SENSIPAR) tablet 30 mg  30 mg Oral Q  breakfast Ernest Haber, PA-C   30 mg at 01/10/16 0755  . ciprofloxacin (CIPRO) IVPB 400 mg  400 mg Intravenous Q24H Samella Parr, NP   400 mg at 01/10/16 1007  . Darbepoetin Alfa (ARANESP) injection 100 mcg  100 mcg Intravenous Q Wed-HD Corliss Parish, MD   100 mcg at 01/04/16 1109  . doxercalciferol (HECTOROL) injection 4 mcg  4 mcg Intravenous Q M,W,F-HD Corliss Parish, MD   4 mcg at 01/06/16 0830  . heparin injection 5,000 Units  5,000 Units Subcutaneous Q8H Samella Parr, NP    5,000 Units at 01/10/16 0504  . HYDROcodone-acetaminophen (NORCO/VICODIN) 5-325 MG per tablet 1 tablet  1 tablet Oral Q6H PRN Hosie Poisson, MD      . insulin aspart (novoLOG) injection 0-9 Units  0-9 Units Subcutaneous Q4H Samella Parr, NP   2 Units at 01/09/16 2009  . insulin detemir (LEVEMIR) injection 5 Units  5 Units Subcutaneous Q2200 Samella Parr, NP   5 Units at 01/09/16 2147  . levothyroxine (SYNTHROID, LEVOTHROID) tablet 100 mcg  100 mcg Oral QAC breakfast Samella Parr, NP   100 mcg at 01/10/16 0755  . metroNIDAZOLE (FLAGYL) IVPB 500 mg  500 mg Intravenous Q8H Samella Parr, NP 100 mL/hr at 01/10/16 0504 500 mg at 01/10/16 0504  . morphine 2 MG/ML injection 1-2 mg  1-2 mg Intravenous Q4H PRN Hosie Poisson, MD   2 mg at 01/09/16 9147  . oxyCODONE (Oxy IR/ROXICODONE) immediate release tablet 10 mg  10 mg Oral Q12H Hosie Poisson, MD   10 mg at 01/10/16 1010     Discharge Medications: Please see discharge summary for a list of discharge medications.  Relevant Imaging Results:  Relevant Lab Results:   Additional Information SSN [id number]

## 2016-01-10 NOTE — Progress Notes (Signed)
Subjective:   Is awake and alert today, mild abd pain, ate grits this am  Objective Vital signs in last 24 hours: Vitals:   01/09/16 1355 01/09/16 2217 01/09/16 2233 01/10/16 0537  BP: (!) 148/62 (!) 105/24 (!) 100/50 122/68  Pulse: 90 (!) 50 62 66  Resp: 18 15  15   Temp: 99 F (37.2 C) 98.5 F (36.9 C)  98.2 F (36.8 C)  TempSrc: Oral Axillary  Axillary  SpO2: 94% 96%  92%  Weight:    96.5 kg (212 lb 11.9 oz)  Height:       Weight change:   Physical Exam: General: obese, awake, responsive, no distress Heart: RRR no rub Lungs:  Poor resp effort and grossly CTA Abdomen: soft sl tender epigastric area Extremities: trace bipedal  edema Dialysis Access: right AVF  Patent on HD    Dialysis: MWF AF    4h  91.5kg  2/2.25 bath  Hep 6000  RUA AVF Hec 59mcg IV/HD aranesp 60 q week /HD Venofer 50mg  q wkkly Hd  Other op labs HGB  10.9/  pth 409    Assessment:  1.  AMS - due to narcotics, resolved 2. Acute diverticulitis w abscess - on IV abx, improving 3. ESRD - MWF via AVF- k 5.0    on schedule 4. Hypertension/volume - BP's soft, norvasc on hold, up 4-5kg by wts 5. Anemia of ESRD - hg 10.9 > 9.6  >9.2continue weekly ESa and fe  6. Metabolic bone disease - corec ca 9.7 phos 6.9  On hec q hd / When eating = sensipar and binder tums ex 2 ac   7. MM= "reported in Remission " 8. Chronic back pain 9. IDDM= per admit  10. Left adrenal mass /Splenic lesion= on CT Admit team wu  11. Nutrition -  (Carb mod Neysa Hotter diet when eating Renal vit ) when eating    Plan - HD tomorrow, attempt UF 2-3 L  Kelly Splinter MD [facility] pager [phone]    cell [phone] 01/10/2016, 11:04 AM    Labs: Basic Metabolic Panel:  Recent Labs Lab 01/04/16 0745 01/06/16 0745 01/09/16 0715  NA 136 133* 132*  K 4.5 4.1 5.0  CL 97* 95* 94*  CO2 26 25 22   GLUCOSE 81 118* 102*  BUN 40* 37* 74*  CREATININE 9.93* 8.27* 9.30*  CALCIUM 8.0* 7.6* 8.4*  PHOS 6.1*  6.2* 6.9*   Liver Function Tests:  Recent Labs Lab 01/04/16 0745 01/06/16 0745 01/09/16 0715  ALBUMIN 2.6* 2.2* 2.3*   No results for input(s): LIPASE, AMYLASE in the last 168 hours. No results for input(s): AMMONIA in the last 168 hours. CBC:  Recent Labs Lab 01/04/16 0745 01/05/16 1512 01/06/16 0745 01/07/16 1043 01/09/16 0715  WBC 18.5* 23.2* 21.9* 18.3* 12.2*  HGB 9.7* 10.6* 9.6* 9.8* 10.2*  HCT 30.6* 32.4* 29.2* 31.1* 30.6*  MCV 95.9 94.2 93.9 92.0 91.9  PLT 286 272 258 275 307   Cardiac Enzymes: No results for input(s): CKTOTAL, CKMB, CKMBINDEX, TROPONINI in the last 168 hours. CBG:  Recent Labs Lab 01/09/16 1622 01/09/16 1950 01/10/16 0034 01/10/16 0412 01/10/16 0753  GLUCAP 193* 170* 96 88 96    Studies/Results: Ct Head Wo Contrast  Result Date: 01/09/2016 CLINICAL DATA:  Patient became unresponsive at 9 a.m. this morning during dialysis. EXAM: CT HEAD WITHOUT CONTRAST TECHNIQUE: Contiguous axial images were obtained from the base of the skull through the vertex without intravenous contrast. COMPARISON:  Head CT scan 01/04/2016.  Brain MRI 01/05/2016. FINDINGS: There is no evidence of acute intracranial abnormality including hemorrhage, infarct, mass, mass effect, midline shift or abnormal extra-axial fluid collection. No hydrocephalus or pneumocephalus. The calvarium is intact. Imaged paranasal sinuses and mastoid air cells are clear. Carotid atherosclerosis is noted. IMPRESSION: No acute abnormality. Atherosclerosis. Electronically Signed   By: Inge Rise M.D.   On: 01/09/2016 10:06   Medications:   . white petrolatum      . cinacalcet  30 mg Oral Q breakfast  . ciprofloxacin  400 mg Intravenous Q24H  . darbepoetin (ARANESP) injection - DIALYSIS  100 mcg Intravenous Q Wed-HD  . doxercalciferol  4 mcg Intravenous Q M,W,F-HD  . heparin  5,000 Units Subcutaneous Q8H  . insulin aspart  0-9 Units Subcutaneous Q4H  . insulin detemir  5 Units  Subcutaneous Q2200  . levothyroxine  100 mcg Oral QAC breakfast  . metronidazole  500 mg Intravenous Q8H  . oxyCODONE  10 mg Oral Q12H

## 2016-01-11 ENCOUNTER — Encounter (HOSPITAL_COMMUNITY): Payer: Self-pay | Admitting: Physician Assistant

## 2016-01-11 ENCOUNTER — Inpatient Hospital Stay (HOSPITAL_COMMUNITY): Payer: Medicare Other

## 2016-01-11 DIAGNOSIS — K5780 Diverticulitis of intestine, part unspecified, with perforation and abscess without bleeding: Principal | ICD-10-CM

## 2016-01-11 HISTORY — PX: IR GENERIC HISTORICAL: IMG1180011

## 2016-01-11 LAB — CBC WITH DIFFERENTIAL/PLATELET
BASOS ABS: 0 10*3/uL (ref 0.0–0.1)
Basophils Relative: 0 %
EOS PCT: 2 %
Eosinophils Absolute: 0.3 10*3/uL (ref 0.0–0.7)
HCT: 31.2 % — ABNORMAL LOW (ref 36.0–46.0)
Hemoglobin: 10.4 g/dL — ABNORMAL LOW (ref 12.0–15.0)
LYMPHS ABS: 1.8 10*3/uL (ref 0.7–4.0)
LYMPHS PCT: 15 %
MCH: 30.8 pg (ref 26.0–34.0)
MCHC: 33.3 g/dL (ref 30.0–36.0)
MCV: 92.3 fL (ref 78.0–100.0)
MONO ABS: 1.1 10*3/uL — AB (ref 0.1–1.0)
MONOS PCT: 9 %
Neutro Abs: 9.1 10*3/uL — ABNORMAL HIGH (ref 1.7–7.7)
Neutrophils Relative %: 74 %
PLATELETS: 346 10*3/uL (ref 150–400)
RBC: 3.38 MIL/uL — ABNORMAL LOW (ref 3.87–5.11)
RDW: 16.8 % — ABNORMAL HIGH (ref 11.5–15.5)
WBC: 12.2 10*3/uL — ABNORMAL HIGH (ref 4.0–10.5)

## 2016-01-11 LAB — BASIC METABOLIC PANEL
Anion gap: 15 (ref 5–15)
BUN: 114 mg/dL — AB (ref 6–20)
CALCIUM: 8.1 mg/dL — AB (ref 8.9–10.3)
CO2: 19 mmol/L — AB (ref 22–32)
Chloride: 97 mmol/L — ABNORMAL LOW (ref 101–111)
Creatinine, Ser: 11.98 mg/dL — ABNORMAL HIGH (ref 0.44–1.00)
GFR calc Af Amer: 3 mL/min — ABNORMAL LOW (ref >60)
GFR, EST NON AFRICAN AMERICAN: 3 mL/min — AB (ref >60)
GLUCOSE: 116 mg/dL — AB (ref 65–99)
Potassium: 6.5 mmol/L (ref 3.5–5.1)
Sodium: 131 mmol/L — ABNORMAL LOW (ref 135–145)

## 2016-01-11 LAB — GLUCOSE, CAPILLARY
GLUCOSE-CAPILLARY: 132 mg/dL — AB (ref 65–99)
Glucose-Capillary: 166 mg/dL — ABNORMAL HIGH (ref 65–99)
Glucose-Capillary: 90 mg/dL (ref 65–99)
Glucose-Capillary: 112 mg/dL — ABNORMAL HIGH (ref 65–99)
Glucose-Capillary: 123 mg/dL — ABNORMAL HIGH (ref 65–99)

## 2016-01-11 LAB — PROTIME-INR
INR: 1.38
Prothrombin Time: 17.1 seconds — ABNORMAL HIGH (ref 11.4–15.2)

## 2016-01-11 LAB — APTT: APTT: 37 s — AB (ref 24–36)

## 2016-01-11 MED ORDER — DOXERCALCIFEROL 4 MCG/2ML IV SOLN
INTRAVENOUS | Status: AC
  Administered 2016-01-11: 4 ug via INTRAVENOUS
  Filled 2016-01-11: qty 2

## 2016-01-11 MED ORDER — HYDROXYZINE HCL 25 MG PO TABS
25.0000 mg | ORAL_TABLET | Freq: Two times a day (BID) | ORAL | Status: DC | PRN
  Administered 2016-01-11: 25 mg via ORAL
  Filled 2016-01-11: qty 1

## 2016-01-11 MED ORDER — LIDOCAINE HCL (PF) 1 % IJ SOLN: 5.0000 mL | INTRAMUSCULAR | Status: DC | PRN

## 2016-01-11 MED ORDER — PENTAFLUOROPROP-TETRAFLUOROETH EX AERO: 1.0000 "application " | INHALATION_SPRAY | CUTANEOUS | Status: DC | PRN

## 2016-01-11 MED ORDER — SODIUM CHLORIDE 0.9 % IV SOLN: 100.0000 mL | INTRAVENOUS | Status: DC | PRN

## 2016-01-11 MED ORDER — LIDOCAINE-PRILOCAINE 2.5-2.5 % EX CREA
1.0000 "application " | TOPICAL_CREAM | CUTANEOUS | Status: DC | PRN
  Filled 2016-01-11: qty 5

## 2016-01-11 MED ORDER — ALBUTEROL (5 MG/ML) CONTINUOUS INHALATION SOLN
2.5000 mg/h | INHALATION_SOLUTION | Freq: Once | RESPIRATORY_TRACT | Status: DC
  Filled 2016-01-11: qty 20

## 2016-01-11 MED ORDER — CALCIUM CARBONATE ANTACID 500 MG PO CHEW
2.0000 | CHEWABLE_TABLET | Freq: Three times a day (TID) | ORAL | Status: DC
  Administered 2016-01-11 – 2016-01-14 (×8): 400 mg via ORAL
  Filled 2016-01-11 (×8): qty 2

## 2016-01-11 MED ORDER — LIDOCAINE HCL 1 % IJ SOLN
INTRAMUSCULAR | Status: AC
  Filled 2016-01-11: qty 20

## 2016-01-11 MED ORDER — CIPROFLOXACIN HCL 500 MG PO TABS
500.0000 mg | ORAL_TABLET | ORAL | Status: DC
  Administered 2016-01-11 – 2016-01-14 (×4): 500 mg via ORAL
  Filled 2016-01-11 (×7): qty 1

## 2016-01-11 MED ORDER — HEPARIN SODIUM (PORCINE) 1000 UNIT/ML DIALYSIS
1000.0000 [IU] | INTRAMUSCULAR | Status: DC | PRN
  Filled 2016-01-11: qty 1

## 2016-01-11 MED ORDER — ALTEPLASE 2 MG IJ SOLR: 2.0000 mg | Freq: Once | INTRAMUSCULAR | Status: DC | PRN

## 2016-01-11 MED ORDER — HEPARIN SODIUM (PORCINE) 1000 UNIT/ML IJ SOLN
INTRAMUSCULAR | Status: AC
  Filled 2016-01-11: qty 1

## 2016-01-11 MED ORDER — DARBEPOETIN ALFA 100 MCG/0.5ML IJ SOSY
PREFILLED_SYRINGE | INTRAMUSCULAR | Status: AC
  Administered 2016-01-11: 100 ug via INTRAVENOUS
  Filled 2016-01-11: qty 0.5

## 2016-01-11 MED ORDER — METRONIDAZOLE 500 MG PO TABS
500.0000 mg | ORAL_TABLET | Freq: Three times a day (TID) | ORAL | Status: DC
  Administered 2016-01-11 – 2016-01-14 (×11): 500 mg via ORAL
  Filled 2016-01-11 (×11): qty 1

## 2016-01-11 NOTE — Progress Notes (Signed)
Physical Therapy Treatment Patient Details Name: [name] MRN: [id number] DOB: 1933/02/06 Today's Date: 01/11/2016    History of Present Illness 82 y.o. female admitted to [facility] on 01/05/2016 for lower abdominal pain.  Pt found to have diverticulitis with abscess.  Pt refusing surgical intervention and being treated medically with antibiotics.  Pt with significant PMhx of DM2, PE, neuropathy, HTN, gout, ESRD on HD MWF, COPD, chronic lower back pain, and anemia.  Of note, on 01/09/16 during HD treatment, rapid response was called because pt became lethargic and less responsive.  Stat CT ordered and showed no acute events.      PT Comments    Pt lethargic but agreeable to PT. +2 max assist for supine to sit and then sit to stand with much time and encouragement. Poor balance in sitting and standing. Pt only able to stand for 15 sec, limited by fatigue and posterior lean.    Follow Up Recommendations  SNF     Equipment Recommendations  Wheelchair (measurements PT);Wheelchair cushion (measurements PT);Hospital bed;Other (comment) (hoyer lift)    Recommendations for Other Services OT consult     Precautions / Restrictions Precautions Precautions: Fall Restrictions Weight Bearing Restrictions: No Other Position/Activity Restrictions: restricted bil UEs due to h/o bil AV fistulas, BPs being taken in legs.    Mobility  Bed Mobility Overal bed mobility: Needs Assistance;+2 for physical assistance Bed Mobility: Supine to Sit;Sit to Supine     Supine to sit: Max assist;HOB elevated Sit to supine: Max assist;+2 for physical assistance   General bed mobility comments: +2 max A to raise trunk, advance BLEs, posterior lean in sitting requiring mod A  Transfers Overall transfer level: Needs assistance Equipment used: Rolling walker (2 wheeled) Transfers: Sit to/from Stand Sit to Stand: +2 physical assistance;Max assist         General transfer comment: +2 max A to rise, much  encouragement and time required, pt repeated said "wait, don't rush me", able to stand for 15 sec with RW with flexed trunk/knees; unable to weight shift in standing  Ambulation/Gait             General Gait Details: Unable at this time.    Stairs            Wheelchair Mobility    Modified Rankin (Stroke Patients Only)       Balance Overall balance assessment: Needs assistance   Sitting balance-Leahy Scale: Poor Sitting balance - Comments: posterior lean requiring mod A, at times able min/guard assist with BUEs supported Postural control: Posterior lean   Standing balance-Leahy Scale: Zero Standing balance comment: posterior lean with RW                    Cognition Arousal/Alertness: Lethargic Behavior During Therapy: Flat affect Overall Cognitive Status: Impaired/Different from baseline Area of Impairment: Following commands;Safety/judgement;Problem solving       Following Commands: Follows one step commands inconsistently (and increased time when she does follow them) Safety/Judgement: Decreased awareness of deficits;Decreased awareness of safety   Problem Solving: Slow processing;Decreased initiation;Difficulty sequencing;Requires verbal cues;Requires tactile cues General Comments: frequent verbal cues required to stay focused on task and to keep eyes open    Exercises      General Comments        Pertinent Vitals/Pain Pain Assessment: No/denies pain    Home Living                      Prior Function  PT Goals (current goals can now be found in the care plan section) Acute Rehab PT Goals Patient Stated Goal: none stated PT Goal Formulation: With patient Time For Goal Achievement: 01/23/16 Potential to Achieve Goals: Fair Progress towards PT goals: Not progressing toward goals - comment (fatigue, weakness, lethargy limit progress)    Frequency  Min 2X/week    PT Plan Frequency needs to be updated     Co-evaluation             End of Session Equipment Utilized During Treatment: Gait belt Activity Tolerance: Patient limited by fatigue;Patient limited by lethargy Patient left: in bed;with call bell/phone within reach;with bed alarm set     Time: [id number] PT Time Calculation (min) (ACUTE ONLY): 28 min  Charges:  $Therapeutic Activity: 23-37 mins                    G Codes:      Philomena Doheny 01/11/2016, 10:56 AM [phone]

## 2016-01-11 NOTE — Plan of Care (Signed)
Problem: Activity: Goal: Risk for activity intolerance will decrease Outcome: Not Progressing Pt needs maximum assistance with bed mobility

## 2016-01-11 NOTE — Progress Notes (Signed)
Pt.'s upper arm AVG is negative for thrill and bruit. Attempted to cannulate by 2RN's but unsuccessful. Dr Mercy Moore ordered to call Dr Jonnie Finner which was paged. Will send pt. Back to room and waut for further order. Pt is alert and responsive and not in any acute distress.

## 2016-01-11 NOTE — H&P (Signed)
Chief Complaint: ESRD on hemodialysis Thrombosed AV graft  Referring Physician(s): Roney Jaffe  Supervising Physician: Corrie Mckusick  Patient Status: Inpatient  History of Present Illness: [name] is a 82 y.o. female who was admitted on 01/24/2016 with abdominal pain.  She was found to have a diverticular abscess.  She has been on antibiotic therapy.  She has longstanding history of ESRD on hemodialysis via her right brach-ax graft which was placed by Dr. [name] on 02/15/2015.  There was attempt to cannulate the graft today for dialysis, however it was found to be thrombosed.  We are asked to perform a thrombectomy of the graft.  She is NPO. Heparin held this morning.  Past Medical History:  Diagnosis Date  . Anemia   . Arthritis    "hips, knees, shoulders" (11/10/2013)  . Asthma   . Chronic lower back pain   . Constipation   . COPD (chronic obstructive pulmonary disease) ([place])   . ESRD (end stage renal disease) on dialysis [facility])    "1st treatment 11/09/2013" MWF.  [address]  . GERD (gastroesophageal reflux disease)   . Gout   . High cholesterol   . History of blood transfusion 1987; 2011   "related to lung OR; HgB went down to 5"  . Hypertension   . Hypothyroidism   . Kidney stones   . Multiple myeloma ([place])   . Neuropathy ([place])   . Neuropathy in diabetes ([place])    Hx: of  . Pulmonary embolism ([place]) 1987  . Type II diabetes mellitus ([place])     Past Surgical History:  Procedure Laterality Date  . ABDOMINAL HYSTERECTOMY  1964  . ABDOMINOPLASTY  ~ 1983   "tummy tuck"  . APPENDECTOMY  1954  . AV FISTULA PLACEMENT Left 2011   "lower arm; never used"  . AV FISTULA PLACEMENT Left 05/15/2013   Procedure: INSERTION OF ARTERIOVENOUS (AV) GORE-TEX GRAFT ARM-LEFT UPPER ARM;  Surgeon: Mal Misty, MD;  Location: [place];  Service: Vascular;  Laterality: Left;  . AV FISTULA PLACEMENT Right 10/19/2014   Procedure: INSERTION OF ARTERIOVENOUS (AV)  4-86m x 45cm GORE-TEX GRAFT RIGHT FOREARM;  Surgeon: CAngelia Mould MD;  [place]  Service: Vascular;  Laterality: Right;  . AV FISTULA PLACEMENT Right 02/15/2015   Procedure: INSERTION OF RIGHT ARTERIOVENOUS (AV) GORE-TEX GRAFT ARM;  Surgeon: CAngelia Mould MD;  [place]  Service: Vascular;  Laterality: Right;  . CHOLECYSTECTOMY    . COLONOSCOPY    . CYSTOSCOPY W/ STONE MANIPULATION  1980's  . EYE SURGERY Bilateral 2016   [facility]-   . HERNIA REPAIR     umbicical hernia  . LUNG REMOVAL, PARTIAL Left 1987   "blood clot"  . THROMBECTOMY W/ EMBOLECTOMY Right 02/18/2015   Procedure: THROMBECTOMY RIGHT UPPER ARM ARTERIOVENOUS GORE-TEX GRAFT with revision of arterial anastamosis;  Surgeon: JMal Misty MD;  Location[place]  Service: Vascular;  Laterality: Right;  . TUBAL LIGATION  1964  . UMBILICAL HERNIA REPAIR  ~ 1983    Allergies: Penicillins cross reactors  Medications: Prior to Admission medications   Medication Sig Start Date End Date Taking? Authorizing Provider  albuterol (PROVENTIL HFA;VENTOLIN HFA) 108 (90 BASE) MCG/ACT inhaler Inhale 1 puff into the lungs 2 (two) times daily as needed for wheezing or shortness of breath.   Yes Historical Provider, MD  allopurinol (ZYLOPRIM) 100 MG tablet Take 100 mg by mouth daily with breakfast.    Yes Historical Provider,  MD  amLODipine (NORVASC) 5 MG tablet Take 1 tablet by mouth daily. 08/26/15  Yes Historical Provider, MD  cinacalcet (SENSIPAR) 30 MG tablet Take 30 mg by mouth daily.   Yes Historical Provider, MD  Cyanocobalamin (VITAMIN B-12 PO) Take 1 tablet by mouth daily.   Yes Historical Provider, MD  ergocalciferol (VITAMIN D2) 50000 UNITS capsule Take 50,000 Units by mouth once a week. Take on Thursdays   Yes Historical Provider, MD  gabapentin (NEURONTIN) 300 MG capsule Take 300 mg by mouth every 8 (eight) hours. 11/05/15  Yes Historical Provider, MD  glimepiride (AMARYL) 1 MG tablet Take 1 mg by mouth  daily with breakfast.    Yes Historical Provider, MD  HYDROcodone-acetaminophen (NORCO) 10-325 MG tablet Take 1 tablet by mouth every 6 (six) hours as needed for moderate pain.   Yes Historical Provider, MD  hydrOXYzine (ATARAX/VISTARIL) 25 MG tablet Take 25 mg by mouth every 12 (twelve) hours as needed for itching.  09/07/14  Yes Historical Provider, MD  insulin aspart (NOVOLOG) 100 UNIT/ML injection Inject 5 Units into the skin daily as needed for high blood sugar (if blood sugar is over 200).   Yes Historical Provider, MD  LEVEMIR FLEXTOUCH 100 UNIT/ML Pen Inject 12 Units into the skin daily as needed (high blood sugar). If Blood sugar is above 110 pt will take 09/15/14  Yes Historical Provider, MD  levothyroxine (SYNTHROID, LEVOTHROID) 100 MCG tablet Take 100 mcg by mouth daily. 09/01/14  Yes Historical Provider, MD  multivitamin (RENA-VIT) TABS tablet Take 1 tablet by mouth daily.   Yes Historical Provider, MD  Oxycodone HCl 20 MG TABS Take 20 mg by mouth every 12 (twelve) hours.  06/07/13  Yes Historical Provider, MD  pantoprazole (PROTONIX) 40 MG tablet Take 40 mg by mouth 2 (two) times daily.    Yes Historical Provider, MD  saxagliptin HCl (ONGLYZA) 2.5 MG TABS tablet Take 2.5 mg by mouth daily with breakfast.    Yes Historical Provider, MD  tiZANidine (ZANAFLEX) 4 MG tablet Take 4 mg by mouth 2 (two) times daily as needed for muscle spasms. Reported on 11/17/2015 01/11/15  Yes Historical Provider, MD  acyclovir (ZOVIRAX) 400 MG tablet TAKE 1 TABLET(400 MG) BY MOUTH DAILY Patient not taking: Reported on 11/17/2015 03/25/15   Curt Bears, MD  B-D ULTRAFINE III SHORT PEN 31G X 8 MM MISC  07/19/14   Historical Provider, MD  dexamethasone (DECADRON) 4 MG tablet TAKE 10 TABLETS(40 MG) BY MOUTH 1 TIME A WEEK Patient not taking: Reported on 06/13/2015 03/25/15   Curt Bears, MD  lidocaine-prilocaine (EMLA) cream Apply 1 application topically daily as needed (for numbing).  11/06/13   Historical Provider,  MD  ondansetron (ZOFRAN) 8 MG tablet Take 1 tablet (8 mg total) by mouth every 8 (eight) hours as needed for nausea or vomiting. Patient not taking: Reported on 04/07/2015 08/05/13   Curt Bears, MD  ONE [facility] ULTRA TEST test strip  12/05/13   Historical Provider, MD     Family History  Problem Relation Age of Onset  . Blindness Father   . Diabetes Father   . Diabetes Sister   . Kidney failure Brother     Social History   Social History  . Marital status: Widowed    Spouse name: N/A  . Number of children: N/A  . Years of education: N/A   Social History Main Topics  . Smoking status: Never Smoker  . Smokeless tobacco: Never Used  . Alcohol use No  .  Drug use: No  . Sexual activity: No   Other Topics Concern  . None   Social History Narrative  . None    Review of Systems: A 12 point ROS discussed  Review of Systems  Constitutional: Positive for activity change and appetite change. Negative for chills and fever.  HENT: Negative.   Respiratory: Negative for cough and shortness of breath.   Cardiovascular: Negative for chest pain.  Gastrointestinal: Positive for abdominal pain. Negative for abdominal distention.  Musculoskeletal: Negative.   Skin: Negative.   Neurological: Negative.   Psychiatric/Behavioral: Negative.     Vital Signs: BP (!) 139/51 (BP Location: Right Leg)   Pulse 60   Temp 98.2 F (36.8 C) (Axillary)   Resp 17   Ht _0  (1.651 m)   Wt 207 lb 3.7 oz (94 kg)   SpO2 97%   BMI 34.49 kg/m   Physical Exam  Constitutional: She is oriented to person, place, and time.  Obese, NAD  HENT:  Head: Normocephalic and atraumatic.  Eyes: EOM are normal.  Neck: Normal range of motion.  Cardiovascular: Normal rate, regular rhythm and normal heart sounds.   Pulmonary/Chest: Effort normal and breath sounds normal. She has no wheezes.  Abdominal: Soft. She exhibits no distension. There is no tenderness.  Musculoskeletal: Normal range of motion.    Neurological: She is alert and oriented to person, place, and time.  Skin: Skin is warm and dry.  Psychiatric: She has a normal mood and affect. Her behavior is normal. Judgment and thought content normal.  Vitals reviewed. Right arm with brachial artery to axillary vein graft. No audible bruit.  Mallampati Score:  MD Evaluation Airway: Other (comments) Airway comments: Lesion on hard palate Heart: WNL Abdomen: WNL Chest/ Lungs: WNL ASA  Classification: 3 Mallampati/Airway Score: One  Imaging: Dg Chest 2 View  Result Date: 01/25/2016 CLINICAL DATA:  Cough and fever tonight. EXAM: CHEST  2 VIEW COMPARISON:  01/23/2015 FINDINGS: Mild cardiomegaly. There is atherosclerosis of the thoracic aorta, unchanged aortic and mediastinal contours. Stable pleural-parenchymal scarring at the left lung base. New linear opacity in the periphery of the right midlung zone. Chronic bronchial thickening is unchanged. No pleural effusion or pneumothorax. A vascular stent in the left axilla. There is degenerative change throughout spine. IMPRESSION: 1. New linear opacity in the right midlung zone likely subsegmental atelectasis or scarring. Stable pleural parenchymal scarring at the left lung base. 2. Aortic atherosclerosis. Electronically Signed   By: Jeb Levering M.D.   On: 01/17/2016 02:51   Ct Head Wo Contrast  Result Date: 01/09/2016 CLINICAL DATA:  Patient became unresponsive at 9 a.m. this morning during dialysis. EXAM: CT HEAD WITHOUT CONTRAST TECHNIQUE: Contiguous axial images were obtained from the base of the skull through the vertex without intravenous contrast. COMPARISON:  Head CT scan 01/04/2016.  Brain MRI 01/05/2016. FINDINGS: There is no evidence of acute intracranial abnormality including hemorrhage, infarct, mass, mass effect, midline shift or abnormal extra-axial fluid collection. No hydrocephalus or pneumocephalus. The calvarium is intact. Imaged paranasal sinuses and mastoid air cells are  clear. Carotid atherosclerosis is noted. IMPRESSION: No acute abnormality. Atherosclerosis. Electronically Signed   By: Inge Rise M.D.   On: 01/09/2016 10:06   Ct Head Wo Contrast  Result Date: 01/04/2016 CLINICAL DATA:  Responsive. Tremor. History of multiple myeloma, hypertension, hypercholesterolemia diabetes. EXAM: CT HEAD WITHOUT CONTRAST TECHNIQUE: Contiguous axial images were obtained from the base of the skull through the vertex without intravenous contrast. COMPARISON:  CT  HEAD February 01, 2005 FINDINGS: Moderately motion degraded examination. INTRACRANIAL CONTENTS: The ventricles and sulci are normal for age. No intraparenchymal hemorrhage, mass effect nor midline shift. Patchy supratentorial white matter hypodensities are less than expected for patient's age and though non-specific likely represent chronic small vessel ischemic disease. No acute large vascular territory infarcts. No abnormal extra-axial fluid collections. Basal cisterns are patent. Moderate calcific atherosclerosis of the carotid siphons. ORBITS: The included ocular globes and orbital contents are non-suspicious. SINUSES: The mastoid aircells and included paranasal sinuses are well-aerated. SKULL/SOFT TISSUES: No skull fracture. No significant soft tissue swelling. IMPRESSION: Negative moderately motion degraded CT HEAD for age. Electronically Signed   By: Elon Alas M.D.   On: 01/04/2016 03:46   Mr Brain Wo Contrast  Result Date: 01/05/2016 CLINICAL DATA:  82 year old hypertensive female with brief episode of delirium from fever. End-stage renal disease on dialysis. Subsequent encounter. EXAM: MRI HEAD WITHOUT CONTRAST TECHNIQUE: Multiplanar, multiecho pulse sequences of the brain and surrounding structures were obtained without intravenous contrast. COMPARISON:  01/04/2016 head CT.  No comparison brain MR. FINDINGS: No acute infarct or intracranial hemorrhage. No MR evidence of herpes encephalitis. Mild chronic  microvascular changes. Mild global atrophy without hydrocephalus. No intracranial mass lesion noted on this unenhanced exam. Post lens replacement without acute orbital abnormality. Major intracranial vascular structures are patent. Surgery upper cervical spine. Cervical medullary junction unremarkable. IMPRESSION: No acute intracranial abnormality.  Please see above. Electronically Signed   By: Genia Del M.D.   On: 01/05/2016 18:45   Ct Abdomen Pelvis W Contrast  Result Date: 01/13/2016 CLINICAL DATA:  82 year old female with lower abdominal pain EXAM: CT ABDOMEN AND PELVIS WITH CONTRAST TECHNIQUE: Multidetector CT imaging of the abdomen and pelvis was performed using the standard protocol following bolus administration of intravenous contrast. CONTRAST:  1 ISOVUE-300 IOPAMIDOL (ISOVUE-300) INJECTION 61% COMPARISON:  CT dated 09/24/2012 FINDINGS: The visualized lung bases are clear. No intra-abdominal free air. Trace free fluid may be present within the pelvis. Cholecystectomy. Mild biliary ductal dilatation, likely post cholecystectomy. The pancreas is unremarkable. There is a 2 cm hypodense lesion in the spleen which is incompletely characterized but may represent a cyst or hemangioma. There is a 2 cm left adrenal high attenuating or enhancing lesion which is increased in size compared to the prior study. MRI without and with contrast is recommended for further characterization. There is severe bilateral renal atrophy. Small nonobstructing bilateral renal calculi measuring up to 4 mm in the upper pole of the left kidney. There is no hydronephrosis on either side. Bilateral renal upper pole exophytic hypodense lesions are not well characterized but likely represent cysts. The visualized ureters and urinary bladder appear unremarkable. Hysterectomy. There is extensive sigmoid diverticulosis with muscular hypertrophy. There is active inflammatory changes of the sigmoid colon compatible with acute  diverticulitis. There is an ill-defined 2.8 x 4.5 cm complex collection with hazy organizing wall extending from the sigmoid colon anteriorly adjacent to the posterior wall of the urinary bladder compatible with diverticular abscess formation. There is no evidence of bowel obstruction. Normal appendix. There is moderate aortoiliac atherosclerotic disease. The origins of the celiac axis, SMA, IMA as well as the origins of the renal arteries are patent. No portal venous gas identified. There is no adenopathy. There is a midline vertical anterior pelvic wall incisional scar with a small fat containing umbilical hernia. The abdominal wall soft tissues are otherwise unremarkable. There is degenerative changes of the spine with multilevel disc desiccation with vacuum phenomena.  No acute fracture. IMPRESSION: Sigmoid diverticulitis with developing diverticular abscess. Enhancing left adrenal lesion. MRI without and with contrast is recommended for further characterization. Electronically Signed   By: Anner Crete M.D.   On: 01/17/2016 05:09    Labs:  CBC:  Recent Labs  01/05/16 1512 01/06/16 0745 01/07/16 1043 01/09/16 0715  WBC 23.2* 21.9* 18.3* 12.2*  HGB 10.6* 9.6* 9.8* 10.2*  HCT 32.4* 29.2* 31.1* 30.6*  PLT 272 258 275 307    COAGS: No results for input(s): INR, APTT in the last 8760 hours.  BMP:  Recent Labs  01/04/16 0639 01/04/16 0745 01/06/16 0745 01/09/16 0715  NA 136 136 133* 132*  K 4.7 4.5 4.1 5.0  CL 97* 97* 95* 94*  CO2 _0 GLUCOSE 84 81 118* 102*  BUN 40* 40* 37* 74*  CALCIUM 7.9* 8.0* 7.6* 8.4*  CREATININE 9.80* 9.93* 8.27* 9.30*  GFRNONAA 3* 3* 4* 3*  GFRAA 4* 4* 5* 4*    LIVER FUNCTION TESTS:  Recent Labs  06/09/15 0820 08/11/15 0819 08/11/15 0819 11/10/15 0955 01/02/16 2230 01/04/16 0745 01/06/16 0745 01/09/16 0715  BILITOT 0.48  --  0.54 0.48 0.2*  --   --   --   AST 16  --  _1 --   --   --   ALT 12  --  11 13 11*  --   --   --    ALKPHOS 112  --  116 130 118  --   --   --   PROT 7.3 6.7 7.4 7.8 7.8  --   --   --   ALBUMIN 3.4*  --  3.3* 3.5 3.2* 2.6* 2.2* 2.3*    TUMOR MARKERS: No results for input(s): AFPTM, CEA, CA199, CHROMGRNA in the last 8760 hours.  Assessment and Plan:  End stage renal failure on hemodialysis   Thrombosed right arm Brach-Ax AV graft.  Will proceed with attempted thrombectomy. Patient understands if unsuccessfull she will need a tunneled HD catheter.  Risks and Benefits discussed with the patient including, but not limited to bleeding, infection, vascular injury, pulmonary embolism, need for tunneled HD catheter placement or even death.  All of the patient's questions were answered, patient is agreeable to proceed. Consent signed and in chart.  Thank you for this interesting consult.  I greatly enjoyed meeting MONEKA MCQUINN and look forward to participating in their care.  A copy of this report was sent to the requesting provider on this date.  Electronically Signed: Murrell Redden PA-C 01/11/2016, 10:38 AM   I spent a total of 40 Minutes in face to face in clinical consultation, greater than 50% of which was counseling/coordinating care for shuntogram and declot.

## 2016-01-11 NOTE — Progress Notes (Signed)
PROGRESS NOTE    [name]  NLZ:[id number] DOB: 12-19-1932 DOA: 01/29/2016 PCP: Julia Brazil, MD    Brief Narrative:  [name] is a 82 y.o. female with a Past Medical History of Anemia, Asthma, COPD, ESRD on dialysis, GERD, HLD, HTN, Hypothyroid, MM, DM, PE, DM,  who presents with diverticulitis w/ likely early abscess. Pt against surgery and has been improving.  Today's plan: Nephro to address issue with AVG. Cont abx and transition to PO in AM. Contact family.   Assessment & Plan:   Principal Problem:   Diverticular disease of intestine with perforation and abscess Active Problems:   Diabetes mellitus with complication (HCC)   End stage renal disease ([place])   Multiple myeloma (HCC)   Hypertension   Diabetes mellitus type 2, insulin dependent (HCC)   Absolute anemia   Hypothyroidism   Chronic low back pain   Left adrenal mass (HCC)   Splenic lesion  Acute diverticulitis with evolving abscess : Admitted for IV antibiotics, initially was NPO, as her pain is improving, leukocytosis improving, started her on clear liquid diet. She was able to tolerate clear liquid diet without nausea ,vomiting or worsening abdominal pain, advanced her diet to full liquid diet> soft diet .  Improving leukocytosis. IR consulted for aspiration and drain placement, but couldn't do as it was not accesible.  Surgery consulted, recommended watching on IV antibiotics and signed off. Pt refusing any surgery, her abdominal pain improving.  Pain control,.     ESRD On HD Further recommendations as per renal.  HD on MWF.   Hypertension:  Controlled today.  Diabetes Mellitus: CBG (last 3)   Recent Labs  01/10/16 1603 01/10/16 2113 01/11/16 0832  GLUCAP 95 166* 90    Was on dextrose fluids, which were discontinued as she is able to tolerate liquid diet.Marland Kitchen Resumed SSI.    Hypothyroidism: resumed synthroid.   MM: In remission.    Left adrenal mass, increased in  size from previous CT scan MRI of the abdomen couldn't be done as she couldn't hold her breath because of her abdominal pain, deferred the test to later when her diverticulitis is resolved.  Please obtain an MRI of the abdomen in am, hopefully she will be able to hold her breath.    Chronic low back pain:  She was initially started on IV morphine PCA, since she was able to tolerate oral, changed to her home meds and IV morphine as needed.   Decreased the dose of oxycodone to 10 mg twice daily.   Acute Encephalopathy: resolved.   On the night of 8/1 pt had a brief episode of delirium from fever. Initial CT head negative, EEG done does not show any epileptiform activity.  MRI brain without contrast ordered for further evaluation, which was unremarkable.  On the morning of 8/7 in HD, pt had a brief episode of altered mental status, of unclear etiology, was able to move all extremities, but responding minimally to questions, ordered a repeat Stat CT head, which was unremarkable.  After 30 minutes, she is at her baseline. Suspect it is probably from the pain medications, decreased the dose of oxycontin and oxycodone.  No more episodes.   Mild hyponatremia;  Monitor. From ESRD. Stable.  Anemia of chronic disease Stable between 9 to 10.    DVT prophylaxis: (Lovenox) Code Status: DNR Family Communication: none at bedside.  Disposition Plan: SNF when stable.    Consultants:   IR consult   Surgery.  Nephrology   Procedures:   MRI of the brain without contrast.   HD   Antimicrobials:IV ciprofloxacin and IV flagyl 8/1   Subjective: No new complaints.   Objective: Vitals:   01/10/16 1433 01/10/16 2109 01/11/16 0232 01/11/16 0500  BP: 125/70 (!) 128/58  (!) 139/51  Pulse: 68 63  60  Resp: '18 18  17  '$ Temp: 98 F (36.7 C) 98.2 F (36.8 C)  98.2 F (36.8 C)  TempSrc: Oral Oral  Axillary  SpO2: 94% 94%  97%  Weight:   94 kg (207 lb 3.7 oz)   Height:         Intake/Output Summary (Last 24 hours) at 01/11/16 0947 Last data filed at 01/11/16 0500  Gross per 24 hour  Intake               60 ml  Output                0 ml  Net               60 ml   Filed Weights   01/09/16 0900 01/10/16 0537 01/11/16 0232  Weight: 95 kg (209 lb 7 oz) 96.5 kg (212 lb 11.9 oz) 94 kg (207 lb 3.7 oz)    Examination:  General exam:  Alert and oriented. Comfortable.  Respiratory system: Clear to auscultation. Respiratory effort normal. No wheezing  Or rhonchi Cardiovascular system: S1 & S2 heard, RRR. No JVD, murmurs, rubs, gallops or clicks. No pedal edema. No AVG bruit heard. Gastrointestinal system: Abdomen is nondistended, soft , mild gen tenderness,.. No organomegaly or masses felt. Normal bowel sounds heard. Central nervous system: Alert and appears oriented. Toy Care of the right upper extremity resolved. Extremities:  Trace pedal edema Skin: No rashes, lesions or ulcers     Data Reviewed: I have personally reviewed following labs and imaging studies  CBC:  Recent Labs Lab 01/05/16 1512 01/06/16 0745 01/07/16 1043 01/09/16 0715  WBC 23.2* 21.9* 18.3* 12.2*  HGB 10.6* 9.6* 9.8* 10.2*  HCT 32.4* 29.2* 31.1* 30.6*  MCV 94.2 93.9 92.0 91.9  PLT 272 258 275 568   Basic Metabolic Panel:  Recent Labs Lab 01/06/16 0745 01/09/16 0715  NA 133* 132*  K 4.1 5.0  CL 95* 94*  CO2 25 22  GLUCOSE 118* 102*  BUN 37* 74*  CREATININE 8.27* 9.30*  CALCIUM 7.6* 8.4*  PHOS 6.2* 6.9*   GFR: Estimated Creatinine Clearance: 5.4 mL/min (by C-G formula based on SCr of 9.3 mg/dL). Liver Function Tests:  Recent Labs Lab 01/06/16 0745 01/09/16 0715  ALBUMIN 2.2* 2.3*   No results for input(s): LIPASE, AMYLASE in the last 168 hours. No results for input(s): AMMONIA in the last 168 hours. Coagulation Profile: No results for input(s): INR, PROTIME in the last 168 hours. Cardiac Enzymes: No results for input(s): CKTOTAL, CKMB, CKMBINDEX,  TROPONINI in the last 168 hours. BNP (last 3 results) No results for input(s): PROBNP in the last 8760 hours. HbA1C: No results for input(s): HGBA1C in the last 72 hours. CBG:  Recent Labs Lab 01/10/16 0753 01/10/16 1133 01/10/16 1603 01/10/16 2113 01/11/16 0832  GLUCAP 96 153* 95 166* 90   Lipid Profile: No results for input(s): CHOL, HDL, LDLCALC, TRIG, CHOLHDL, LDLDIRECT in the last 72 hours. Thyroid Function Tests: No results for input(s): TSH, T4TOTAL, FREET4, T3FREE, THYROIDAB in the last 72 hours. Anemia Panel: No results for input(s): VITAMINB12, FOLATE, FERRITIN, TIBC, IRON, RETICCTPCT in the last 72  hours. Sepsis Labs: No results for input(s): PROCALCITON, LATICACIDVEN in the last 168 hours.  Recent Results (from the past 240 hour(s))  Blood culture (routine x 2)     Status: None   Collection Time: 01/26/2016  1:50 AM  Result Value Ref Range Status   Specimen Description BLOOD LEFT ANTECUBITAL  Final   Special Requests BOTTLES DRAWN AEROBIC ONLY 5CC  Final   Culture NO GROWTH 5 DAYS  Final   Report Status 01/08/2016 FINAL  Final  Blood culture (routine x 2)     Status: None   Collection Time: 01/14/2016  1:55 AM  Result Value Ref Range Status   Specimen Description BLOOD LEFT HAND  Final   Special Requests BOTTLES DRAWN AEROBIC ONLY 5CC  Final   Culture NO GROWTH 5 DAYS  Final   Report Status 01/08/2016 FINAL  Final         Radiology Studies: Ct Head Wo Contrast  Result Date: 01/09/2016 CLINICAL DATA:  Patient became unresponsive at 9 a.m. this morning during dialysis. EXAM: CT HEAD WITHOUT CONTRAST TECHNIQUE: Contiguous axial images were obtained from the base of the skull through the vertex without intravenous contrast. COMPARISON:  Head CT scan 01/04/2016.  Brain MRI 01/05/2016. FINDINGS: There is no evidence of acute intracranial abnormality including hemorrhage, infarct, mass, mass effect, midline shift or abnormal extra-axial fluid collection. No  hydrocephalus or pneumocephalus. The calvarium is intact. Imaged paranasal sinuses and mastoid air cells are clear. Carotid atherosclerosis is noted. IMPRESSION: No acute abnormality. Atherosclerosis. Electronically Signed   By: Inge Rise M.D.   On: 01/09/2016 10:06        Scheduled Meds: . cinacalcet  30 mg Oral Q breakfast  . ciprofloxacin  400 mg Intravenous Q24H  . darbepoetin (ARANESP) injection - DIALYSIS  100 mcg Intravenous Q Wed-HD  . doxercalciferol  4 mcg Intravenous Q M,W,F-HD  . heparin  5,000 Units Subcutaneous Q8H  . insulin aspart  0-9 Units Subcutaneous TID WC  . insulin detemir  5 Units Subcutaneous Q2200  . levothyroxine  100 mcg Oral QAC breakfast  . metronidazole  500 mg Intravenous Q8H  . oxyCODONE  10 mg Oral Q12H   Continuous Infusions:     LOS: 8 days    Time spent: 35 minutes.     Elwin Mocha, MD [facility] Pager [phone]  If 7PM-7AM, please contact night-coverage [email or web address] Password [facility] 01/11/2016, 9:47 AM

## 2016-01-11 NOTE — Procedures (Signed)
Interventional Radiology Procedure Note  Procedure: Placement of a right IJ approach temporary HD catheter.  Triple lumen, 19cm.    May be converted if needed.  .  Complications: None Recommendations:  - OK to use for dialysis.  - Ok to shower tomorrow - Do not submerge   - Routine line care   Signed,  Dulcy Fanny. Earleen Newport, DO

## 2016-01-11 NOTE — Clinical Social Work Placement (Signed)
   CLINICAL SOCIAL WORK PLACEMENT  NOTE  Date:  01/11/2016  Patient Details  Name: [name] MRN: [id number] Date of Birth: Nov 28, 1932  Clinical Social Work is seeking post-discharge placement for this patient at the [place] level of care (*CSW will initial, date and re-position this form in  chart as items are completed):  Yes   Patient/family provided with [place] Work Department's list of facilities offering this level of care within the geographic area requested by the patient (or if unable, by the patient's family).  Yes   Patient/family informed of their freedom to choose among providers that offer the needed level of care, that participate in Medicare, Medicaid or managed care program needed by the patient, have an available bed and are willing to accept the patient.  Yes   Patient/family informed of [facility]'s ownership interest in [facility] and [facility], as well as of the fact that they are under no obligation to receive care at these facilities.  PASRR submitted to EDS on 01/10/16     PASRR number received on 01/10/16     Existing PASRR number confirmed on       FL2 transmitted to all facilities in geographic area requested by pt/family on 01/10/16     FL2 transmitted to all facilities within larger geographic area on       Patient informed that his/her managed care company has contracts with or will negotiate with certain facilities, including the following:            Patient/family informed of bed offers received.  Patient chooses bed at       Physician recommends and patient chooses bed at      Patient to be transferred to   on  .  Patient to be transferred to facility by       Patient family notified on   of transfer.  Name of family member notified:        PHYSICIAN       Additional Comment:    Barbette Or, [place]

## 2016-01-11 NOTE — Clinical Social Work Note (Signed)
Clinical Social Work Assessment  Patient Details  Name: [name] MRN: [id number] Date of Birth: Dec 13, 1932  Date of referral:  01/10/16               Reason for consult:  Facility Placement                Permission sought to share information with:  Family Supports Permission granted to share information::  Yes, Verbal Permission Granted  Name::     Wander Dedic  Relationship::  Daughter  Contact Information:  [phone] (H) / [phone] (C)  Housing/Transportation Living arrangements for the past 2 months:  Single Family Home Source of Information:  Patient, Adult Children Patient Interpreter Needed:  None Criminal Activity/Legal Involvement Pertinent to Current Situation/Hospitalization:  No - Comment as needed Significant Relationships:  Adult Children Lives with:  Adult Children Do you feel safe going back to the place where you live?  Yes Need for family participation in patient care:  Yes (Comment)  Care giving concerns:  Patient daughter states that patient is currently living with her and she physically is unable to manage patient in her current condition.   Social Worker assessment / plan:  Holiday representative met with patient at bedside to offer support and discuss patient needs at discharge.  Patient very lethargic but did provide permission to contact her daughter, Mariann Laster.  CSW spoke with patient daughter over the phone who states that patient has been living with her and using [facility] transport for dialysis at 6am.  Patient is currently getting dialyzed at the facility off of [address]. In [place], [place].  Patient daughter is in agreement with ST-SNF placement prior to return home.  CSW initiated SNF search and will follow up with patient and patient daughter regarding available bed offers.  CSW remains available for support and to facilitate patient discharge needs once medically stable.  Employment status:  Retired Office manager PT Recommendations:  [place] / Referral to community resources:  [place]  Patient/Family's Response to care:  Patient daughter verbalizes understanding of CSW role and appreciative of support and concern.  Patient daughter understanding of SNF process and agreeable with placement at discharge.  Patient/Family's Understanding of and Emotional Response to Diagnosis, Current Treatment, and Prognosis:  Patient family understanding of patient limitations and barriers to return home at discharge.  Patient family has also looked into the possibility of Hospice for patient at a later date.  Patient family very in tune to patient needs and medical conditions.  Emotional Assessment Appearance:  Appears stated age Attitude/Demeanor/Rapport:  Lethargic Affect (typically observed):  Unable to Assess (Per RN, patient remains very lethargic following pain medication) Orientation:  Oriented to Self, Oriented to Place, Oriented to  Time, Oriented to Situation Alcohol / Substance use:  Not Applicable Psych involvement (Current and /or in the community):  No (Comment)  Discharge Needs  Concerns to be addressed:  Discharge Planning Concerns Readmission within the last 30 days:  No Current discharge risk:  Physical Impairment Barriers to Discharge:  Continued Medical Work up  [employer], [place]

## 2016-01-11 NOTE — Progress Notes (Signed)
Subjective:   Clotted  Avgg today and IR Notified  For declot per IR/ she has no cos this am ,frustrated sec to inconvenient stool   Objective Vital signs in last 24 hours: Vitals:   01/10/16 1433 01/10/16 2109 01/11/16 0232 01/11/16 0500  BP: 125/70 (!) 128/58  (!) 139/51  Pulse: 68 63  60  Resp: 18 18  17   Temp: 98 F (36.7 C) 98.2 F (36.8 C)  98.2 F (36.8 C)  TempSrc: Oral Oral  Axillary  SpO2: 94% 94%  97%  Weight:   94 kg (207 lb 3.7 oz)   Height:       Weight change: -0.9 kg (-1 lb 15.7 oz)  Physical Exam: General: obese, awake, NAD, soft spoken but appropriate  Heart: RRR no rub Lungs:  CTA bilat. non labored breathing  Abdomen: soft BS Pos. Minamally tender epigastric area , nondistended  Extremities: trace bipedal  edema Dialysis Access:  RUA AVGG no bruit or thrill     Dialysis: MWF AF    4h  91.5kg  2/2.25 bath  Hep 6000  RUA AVGG Hec 81mcg IV/HD aranesp 60 q week /HD Venofer 50mg  q wkkly Hd  Other op labs HGB  10.9/  pth 409              Problem/Plan:    1. Clotted RUA AVGG - IR contacted to declot today  2. AMS - due to narcotics, resolved 3. Acute diverticulitis w abscess - on IV antibiotics, improving 4. ESRD - MWF via [place]- last  k 5.0 on 8/07 on schedule today after declotting by IR 5. Hypertension/volume - BP's stable this am , norvasc on hold, up 3 kg by wts =bedwt  6. Anemia of ESRD - hg 10.9 > 9.6  >9.2> 10.2 continue weekly ESa and fe  7. Metabolic bone disease - corec ca 9.7 phos 6.9  On hec q hd / When eating = sensipar and binder tums ex 2 ac   8. MM= "reported in Remission " 9. Chronic back pain 10. IDDM= per admit  11. Left adrenal mass /Splenic lesion= on CT Admit team wu  12. Nutrition -  (Carb mod Neysa Hotter diet when eating Renal vit ) when eating    Ernest Haber, PA-C [place] [phone] 01/11/2016,11:34 AM  LOS: 8 days   Pt seen, examined and agree w A/P as above. She has not IV access and  a clotted graft so won't be able to get IV abx today, perhaps we could give her a well-absorbed PO antibiotic for the next 24-48 hrs, she should be on HD by tomorrow afternoon and IV could be given at that time.  Doing better overall, updated the daughter on the phone.  Appreciate IR assist w declot attempt.   Kelly Splinter MD [facility] pager [phone]    cell [phone] 01/11/2016, 1:01 PM    Labs: Basic Metabolic Panel:  Recent Labs Lab 01/06/16 0745 01/09/16 0715  NA 133* 132*  K 4.1 5.0  CL 95* 94*  CO2 25 22  GLUCOSE 118* 102*  BUN 37* 74*  CREATININE 8.27* 9.30*  CALCIUM 7.6* 8.4*  PHOS 6.2* 6.9*   Liver Function Tests:  Recent Labs Lab 01/06/16 0745 01/09/16 0715  ALBUMIN 2.2* 2.3*   No results for input(s): LIPASE, AMYLASE in the last 168 hours. No results for input(s): AMMONIA in the last 168 hours. CBC:  Recent Labs Lab 01/05/16 1512 01/06/16 0745 01/07/16 1043 01/09/16 0715  WBC 23.2* 21.9* 18.3* 12.2*  HGB 10.6* 9.6* 9.8* 10.2*  HCT 32.4* 29.2* 31.1* 30.6*  MCV 94.2 93.9 92.0 91.9  PLT 272 258 275 307   Cardiac Enzymes: No results for input(s): CKTOTAL, CKMB, CKMBINDEX, TROPONINI in the last 168 hours. CBG:  Recent Labs Lab 01/10/16 0753 01/10/16 1133 01/10/16 1603 01/10/16 2113 01/11/16 0832  GLUCAP 96 153* 95 166* 90    Studies/Results: No results found. Medications:   . cinacalcet  30 mg Oral Q breakfast  . ciprofloxacin  400 mg Intravenous Q24H  . darbepoetin (ARANESP) injection - DIALYSIS  100 mcg Intravenous Q Wed-HD  . doxercalciferol  4 mcg Intravenous Q M,W,F-HD  . heparin  5,000 Units Subcutaneous Q8H  . insulin aspart  0-9 Units Subcutaneous TID WC  . insulin detemir  5 Units Subcutaneous Q2200  . levothyroxine  100 mcg Oral QAC breakfast  . metronidazole  500 mg Intravenous Q8H  . oxyCODONE  10 mg Oral Q12H

## 2016-01-11 NOTE — Progress Notes (Signed)
CRITICAL VALUE ALERT  Critical value received:  Potassium 6.5  Date of notification:  01/11/16  Time of notification: 1420hrs  Critical value read back:yes  Nurse who received alert: Renato Shin   MD notified (1st page):  Dr Aggie Moats  Time of first page:  1420

## 2016-01-11 NOTE — Clinical Social Work Note (Signed)
Clinical Social Worker met with patient and daughter at bedside to offer continued support and further discuss discharge planning needs.  Patient and patient daughter have chosen placement at [facility] so that patient will remain close to her dialysis center.  CSW updated facility who is agreeable and will arrange dialysis transport through [facility].  CSW remains available for support and to facilitate patient discharge needs once medically stable.  [name], [place]

## 2016-01-12 LAB — GLUCOSE, CAPILLARY
GLUCOSE-CAPILLARY: 124 mg/dL — AB (ref 65–99)
GLUCOSE-CAPILLARY: 137 mg/dL — AB (ref 65–99)
GLUCOSE-CAPILLARY: 140 mg/dL — AB (ref 65–99)
GLUCOSE-CAPILLARY: 150 mg/dL — AB (ref 65–99)
GLUCOSE-CAPILLARY: 147 mg/dL — AB (ref 65–99)

## 2016-01-12 LAB — CBC WITH DIFFERENTIAL/PLATELET
BASOS ABS: 0 10*3/uL (ref 0.0–0.1)
Basophils Relative: 0 %
EOS PCT: 1 %
Eosinophils Absolute: 0.1 10*3/uL (ref 0.0–0.7)
HEMATOCRIT: 28 % — AB (ref 36.0–46.0)
HEMOGLOBIN: 9.2 g/dL — AB (ref 12.0–15.0)
LYMPHS ABS: 1.2 10*3/uL (ref 0.7–4.0)
LYMPHS PCT: 10 %
MCH: 30 pg (ref 26.0–34.0)
MCHC: 32.9 g/dL (ref 30.0–36.0)
MCV: 91.2 fL (ref 78.0–100.0)
Monocytes Absolute: 1 10*3/uL (ref 0.1–1.0)
Monocytes Relative: 8 %
Neutro Abs: 9.4 10*3/uL — ABNORMAL HIGH (ref 1.7–7.7)
Neutrophils Relative %: 81 %
PLATELETS: 245 10*3/uL (ref 150–400)
RBC: 3.07 MIL/uL — AB (ref 3.87–5.11)
RDW: 16.8 % — ABNORMAL HIGH (ref 11.5–15.5)
WBC: 11.7 10*3/uL — AB (ref 4.0–10.5)

## 2016-01-12 LAB — RENAL FUNCTION PANEL
ANION GAP: 15 (ref 5–15)
Albumin: 2.2 g/dL — ABNORMAL LOW (ref 3.5–5.0)
BUN: 53 mg/dL — ABNORMAL HIGH (ref 6–20)
CHLORIDE: 93 mmol/L — AB (ref 101–111)
CO2: 25 mmol/L (ref 22–32)
CREATININE: 7.24 mg/dL — AB (ref 0.44–1.00)
Calcium: 8.1 mg/dL — ABNORMAL LOW (ref 8.9–10.3)
GFR, EST AFRICAN AMERICAN: 5 mL/min — AB (ref >60)
GFR, EST NON AFRICAN AMERICAN: 5 mL/min — AB (ref >60)
Glucose, Bld: 150 mg/dL — ABNORMAL HIGH (ref 65–99)
Phosphorus: 7 mg/dL — ABNORMAL HIGH (ref 2.5–4.6)
Potassium: 5.1 mmol/L (ref 3.5–5.1)
Sodium: 133 mmol/L — ABNORMAL LOW (ref 135–145)

## 2016-01-12 MED ORDER — SODIUM CHLORIDE 0.9% FLUSH
10.0000 mL | INTRAVENOUS | Status: DC | PRN
  Administered 2016-01-12 – 2016-01-14 (×3): 10 mL
  Filled 2016-01-12 (×3): qty 40

## 2016-01-12 MED ORDER — SODIUM CHLORIDE 0.9% FLUSH
10.0000 mL | Freq: Two times a day (BID) | INTRAVENOUS | Status: DC
  Administered 2016-01-12 – 2016-01-14 (×3): 10 mL

## 2016-01-12 NOTE — Progress Notes (Signed)
CSW received call from patient's daughter. Patient would now like to go home at discharge. Pt's daughter states that she would like home health services and for her family to be taught how to care for the patient so she can discharge home. Covering RNCM alerted.  CSW signing off. Please re-consult if discharge plan changes.  Percell Locus Lexany Belknap LCSWA [phone]

## 2016-01-12 NOTE — Progress Notes (Signed)
PROGRESS NOTE    [name]  ZOX:[id number] DOB: 10-Nov-1932 DOA: 01/10/2016 PCP: [name], MD    Brief Narrative:  [name] is a 82 y.o. female with a Past Medical History of Anemia, Asthma, COPD, ESRD on dialysis, GERD, HLD, HTN, Hypothyroid, MM, DM, PE, DM,  who presents with diverticulitis w/ likely early abscess. Pt against surgery and has been improving. Clot in AVG. Using temp HD cath in R IJ. IR to declot graft.  Today's plan: IR to address issue with AVG. Cont PO abx.  Assessment & Plan:   Principal Problem:   Diverticular disease of intestine with perforation and abscess Active Problems:   Diabetes mellitus with complication (HCC)   End stage renal disease ([place])   Multiple myeloma (HCC)   Hypertension   Diabetes mellitus type 2, insulin dependent (HCC)   Absolute anemia   Hypothyroidism   Chronic low back pain   Left adrenal mass (HCC)   Splenic lesion  Acute diverticulitis with evolving abscess (improving) : Admitted for IV antibiotics, initially was NPO, as her pain is improving, leukocytosis improving, started her on clear liquid diet. She was able to tolerate clear liquid diet without nausea ,vomiting or worsening abdominal pain, advanced her diet to full liquid diet> soft diet .  Improving leukocytosis. IR consulted for aspiration and drain placement, but couldn't do as it was not accesible.  Surgery consulted, recommended watching on IV antibiotics and signed off. Pt refusing any surgery, her abdominal pain improving.  Pain control,.  Plan is d/c home when medically stable. Will need H/H PT and other svcs.  ESRD On HD Further recommendations as per renal.  HD on MWF.  AVG clot, IR to try to remove tomorrow; was going to happen 8/9 but K+ was to high  Hypertension:  Controlled today.  Diabetes Mellitus: CBG (last 3)   Recent Labs  01/12/16 0758 01/12/16 1202 01/12/16 1846  GLUCAP 137* 140* 150*    Was on dextrose  fluids, which were discontinued as she is able to tolerate liquid diet.Marland Kitchen Resumed SSI.    Hypothyroidism: Cont resumed synthroid.   MM: In remission.    Left adrenal mass, increased in size from previous CT scan MRI of the abdomen couldn't be done as she couldn't hold her breath because of her abdominal pain, deferred the test to later when her diverticulitis is resolved.  Please obtain an MRI of the abdomen in am, hopefully she will be able to hold her breath.    Chronic low back pain:  She was initially started on IV morphine PCA, since she was able to tolerate oral, changed to her home meds and IV morphine as needed.   Decreased the dose of oxycodone to 10 mg twice daily.   Acute Encephalopathy: resolved.   On the night of 8/1 pt had a brief episode of delirium from fever. Initial CT head negative, EEG done does not show any epileptiform activity.  MRI brain without contrast ordered for further evaluation, which was unremarkable.  On the morning of 8/7 in HD, pt had a brief episode of altered mental status, of unclear etiology, was able to move all extremities, but responding minimally to questions, ordered a repeat Stat CT head, which was unremarkable.  After 30 minutes, she is at her baseline. Suspect it is probably from the pain medications, decreased the dose of oxycontin and oxycodone.  No more episodes.   Mild hyponatremia;  Monitor. From ESRD. Stable.  Anemia of chronic  disease Stable between 9 to 10.    DVT prophylaxis: (Lovenox) Code Status: DNR Family Communication: none at bedside.  Disposition Plan: SNF when stable.    Consultants:   IR consult   Surgery.   Nephrology   Procedures:   MRI of the brain without contrast.   HD  RIJ place 8/9 temp HD cath   Antimicrobials:IV ciprofloxacin and IV flagyl 8/1   Subjective: No new complaints.   Objective: Vitals:   01/12/16 1630 01/12/16 1700 01/12/16 1723 01/12/16 1827  BP: (!) 104/54 (!) 107/58  107/60 114/66  Pulse: 84 84 90 89  Resp:   18 17  Temp:   98 F (36.7 C) 98.8 F (37.1 C)  TempSrc:   Oral Oral  SpO2:   98% 99%  Weight:   89.9 kg (198 lb 3.1 oz)   Height:        Intake/Output Summary (Last 24 hours) at 01/12/16 1933 Last data filed at 01/12/16 1723  Gross per 24 hour  Intake                0 ml  Output             4000 ml  Net            -4000 ml   Filed Weights   01/12/16 0527 01/12/16 1346 01/12/16 1723  Weight: 98.2 kg (216 lb 7.9 oz) 92.9 kg (204 lb 12.9 oz) 89.9 kg (198 lb 3.1 oz)    Examination:  General exam:  Alert and oriented. Comfortable. NAD Respiratory system: Clear to auscultation. Respiratory effort normal. No wheezing  Or rhonchi Cardiovascular system: S1 & S2 heard, RRR. No JVD, murmurs, rubs, gallops or clicks. No pedal edema. No AVG bruit heard. Gastrointestinal system: Abdomen is nondistended, soft , mild gen tenderness. No organomegaly or masses felt. Normal bowel sounds heard. Central nervous system: Alert and appears oriented. Extremities:  Trace pedal edema Skin: No rashes, lesions or ulcers     Data Reviewed: I have personally reviewed following labs and imaging studies  CBC:  Recent Labs Lab 01/06/16 0745 01/07/16 1043 01/09/16 0715 01/11/16 1310 01/12/16 0712  WBC 21.9* 18.3* 12.2* 12.2* 11.7*  NEUTROABS  --   --   --  9.1* 9.4*  HGB 9.6* 9.8* 10.2* 10.4* 9.2*  HCT 29.2* 31.1* 30.6* 31.2* 28.0*  MCV 93.9 92.0 91.9 92.3 91.2  PLT 258 275 307 346 177   Basic Metabolic Panel:  Recent Labs Lab 01/06/16 0745 01/09/16 0715 01/11/16 1310 01/12/16 0712  NA 133* 132* 131* 133*  K 4.1 5.0 6.5* 5.1  CL 95* 94* 97* 93*  CO2 25 22 19* 25  GLUCOSE 118* 102* 116* 150*  BUN 37* 74* 114* 53*  CREATININE 8.27* 9.30* 11.98* 7.24*  CALCIUM 7.6* 8.4* 8.1* 8.1*  PHOS 6.2* 6.9*  --  7.0*   GFR: Estimated Creatinine Clearance: 6.8 mL/min (by C-G formula based on SCr of 7.24 mg/dL). Liver Function Tests:  Recent  Labs Lab 01/06/16 0745 01/09/16 0715 01/12/16 0712  ALBUMIN 2.2* 2.3* 2.2*   No results for input(s): LIPASE, AMYLASE in the last 168 hours. No results for input(s): AMMONIA in the last 168 hours. Coagulation Profile:  Recent Labs Lab 01/11/16 1310  INR 1.38   Cardiac Enzymes: No results for input(s): CKTOTAL, CKMB, CKMBINDEX, TROPONINI in the last 168 hours. BNP (last 3 results) No results for input(s): PROBNP in the last 8760 hours. HbA1C: No results for input(s): HGBA1C in the  last 72 hours. CBG:  Recent Labs Lab 01/11/16 2108 01/12/16 0536 01/12/16 0758 01/12/16 1202 01/12/16 1846  GLUCAP 123* 124* 137* 140* 150*   Lipid Profile: No results for input(s): CHOL, HDL, LDLCALC, TRIG, CHOLHDL, LDLDIRECT in the last 72 hours. Thyroid Function Tests: No results for input(s): TSH, T4TOTAL, FREET4, T3FREE, THYROIDAB in the last 72 hours. Anemia Panel: No results for input(s): VITAMINB12, FOLATE, FERRITIN, TIBC, IRON, RETICCTPCT in the last 72 hours. Sepsis Labs: No results for input(s): PROCALCITON, LATICACIDVEN in the last 168 hours.  Recent Results (from the past 240 hour(s))  Blood culture (routine x 2)     Status: None   Collection Time: 01/12/2016  1:50 AM  Result Value Ref Range Status   Specimen Description BLOOD LEFT ANTECUBITAL  Final   Special Requests BOTTLES DRAWN AEROBIC ONLY 5CC  Final   Culture NO GROWTH 5 DAYS  Final   Report Status 01/08/2016 FINAL  Final  Blood culture (routine x 2)     Status: None   Collection Time: 01/16/2016  1:55 AM  Result Value Ref Range Status   Specimen Description BLOOD LEFT HAND  Final   Special Requests BOTTLES DRAWN AEROBIC ONLY 5CC  Final   Culture NO GROWTH 5 DAYS  Final   Report Status 01/08/2016 FINAL  Final         Radiology Studies: Ir US Guide Vasc Access Right  Result Date: 01/11/2016 INDICATION: 82 year old female with a history of hyperkalemia and thrombosed dialysis graft. Plan is to place temporary  hemodialysis catheter for hemodialysis session and treat the elevated potassium. Interval attempt at dialysis graft declot. EXAM: CENTRAL VENOUS CATHETER WITH FLUOROSCOPY; IR ULTRASOUND GUIDANCE VASC ACCESS RIGHT MEDICATIONS: None ANESTHESIA/SEDATION: None FLUOROSCOPY TIME:  Fluoroscopy Time: 0 minutes 12 seconds (0.7 mGy). COMPLICATIONS: None PROCEDURE: Informed written consent was obtained from the patient and the patient's family after a thorough discussion of the procedural risks, benefits and alternatives. All questions were addressed. A timeout was performed prior to the initiation of the procedure. The right neck and chest was prepped with chlorhexidine, and draped in the usual sterile fashion using maximum barrier technique (cap and mask, sterile gown, sterile gloves, large sterile sheet, hand hygiene and cutaneous antiseptic). Local anesthesia was attained by infiltration with 1% lidocaine without epinephrine. Ultrasound demonstrated patency of the right internal jugular vein, and this was documented with an image. Under real-time ultrasound guidance, this vein was accessed with a 21 gauge micropuncture needle and image documentation was performed. A small dermatotomy was made at the access site with an 11 scalpel. A 0.018" wire was advanced into the SVC and the access needle exchanged for a 21F micropuncture vascular sheath. The 0.018" wire was then removed and a 0.035" wire advanced into the IVC. Upon withdrawal of the 018 wire, the wire was marked for appropriate length of the internal portion of the catheter. A 19 cm catheter was selected. Skin and subcutaneous tissues were serially dilated. Catheter was placed on the wire. The catheter tip is positioned in the upper right atrium. This was documented with a spot image. Both ports of the hemodialysis catheter were then tested for excellent function. The ports were then locked with heparinized lock. Patient tolerated the procedure well and remained  hemodynamically stable throughout. No complications were encountered and no significant blood loss was encountered. IMPRESSION: Status post right IJ temporary hemodialysis catheter. Catheter ready for use. Signed, Dulcy Fanny. Earleen Newport, DO [facility] Radiology Electronically Signed  By: Corrie Mckusick D.O.   On: 01/11/2016 17:45   Ir Fluoro Guide Cv Midline Picc Right  Result Date: 01/11/2016 INDICATION: 82 year old female with a history of hyperkalemia and thrombosed dialysis graft. Plan is to place temporary hemodialysis catheter for hemodialysis session and treat the elevated potassium. Interval attempt at dialysis graft declot. EXAM: CENTRAL VENOUS CATHETER WITH FLUOROSCOPY; IR ULTRASOUND GUIDANCE VASC ACCESS RIGHT MEDICATIONS: None ANESTHESIA/SEDATION: None FLUOROSCOPY TIME:  Fluoroscopy Time: 0 minutes 12 seconds (0.7 mGy). COMPLICATIONS: None PROCEDURE: Informed written consent was obtained from the patient and the patient's family after a thorough discussion of the procedural risks, benefits and alternatives. All questions were addressed. A timeout was performed prior to the initiation of the procedure. The right neck and chest was prepped with chlorhexidine, and draped in the usual sterile fashion using maximum barrier technique (cap and mask, sterile gown, sterile gloves, large sterile sheet, hand hygiene and cutaneous antiseptic). Local anesthesia was attained by infiltration with 1% lidocaine without epinephrine. Ultrasound demonstrated patency of the right internal jugular vein, and this was documented with an image. Under real-time ultrasound guidance, this vein was accessed with a 21 gauge micropuncture needle and image documentation was performed. A small dermatotomy was made at the access site with an 11 scalpel. A 0.018" wire was advanced into the SVC and the access needle exchanged for a 39F micropuncture vascular sheath. The 0.018" wire was then  removed and a 0.035" wire advanced into the IVC. Upon withdrawal of the 018 wire, the wire was marked for appropriate length of the internal portion of the catheter. A 19 cm catheter was selected. Skin and subcutaneous tissues were serially dilated. Catheter was placed on the wire. The catheter tip is positioned in the upper right atrium. This was documented with a spot image. Both ports of the hemodialysis catheter were then tested for excellent function. The ports were then locked with heparinized lock. Patient tolerated the procedure well and remained hemodynamically stable throughout. No complications were encountered and no significant blood loss was encountered. IMPRESSION: Status post right IJ temporary hemodialysis catheter. Catheter ready for use. Signed, Dulcy Fanny. Earleen Newport, DO [facility] Radiology Electronically Signed   By: Corrie Mckusick D.O.   On: 01/11/2016 17:45        Scheduled Meds: . calcium carbonate  2 tablet Oral TID WC  . cinacalcet  30 mg Oral Q breakfast  . ciprofloxacin  500 mg Oral Q24H  . darbepoetin (ARANESP) injection - DIALYSIS  100 mcg Intravenous Q Wed-HD  . doxercalciferol  4 mcg Intravenous Q M,W,F-HD  . heparin  5,000 Units Subcutaneous Q8H  . insulin aspart  0-9 Units Subcutaneous TID WC  . insulin detemir  5 Units Subcutaneous Q2200  . levothyroxine  100 mcg Oral QAC breakfast  . metroNIDAZOLE  500 mg Oral Q8H  . oxyCODONE  10 mg Oral Q12H  . sodium chloride flush  10-40 mL Intracatheter Q12H   Continuous Infusions:     LOS: 9 days    Time spent: 35 minutes.     Elwin Mocha, MD [facility] Pager [phone]  If 7PM-7AM, please contact night-coverage [email or web address] Password [facility] 01/12/2016, 7:33 PM

## 2016-01-12 NOTE — Progress Notes (Signed)
Patient ID: [name], female   DOB: 03/05/1933, 82 y.o.   MRN: [id number]   Was scheduled for Rt arm dialysis graft declot in IR 8/9 K too high to safely proceed Temp dialysis catheter placed instead Has dialyzed once and K down to 5 In dialysis now  Scheduled for reattempt 8/11 Consent signed andin chart

## 2016-01-12 NOTE — Progress Notes (Signed)
Subjective:   HD last night and again today. No c/o Objective Vital signs in last 24 hours: Vitals:   01/11/16 2000 01/11/16 2020 01/11/16 2112 01/12/16 0527  BP: 140/75 (!) 148/73 139/63 140/60  Pulse: 95 90 84 84  Resp:  12 16 17   Temp:  98.6 F (37 C) 98.3 F (36.8 C) 98 F (36.7 C)  TempSrc:  Oral  Axillary  SpO2:  96% 97% 97%  Weight:    98.2 kg (216 lb 7.9 oz)  Height:       Weight change: 1.1 kg (2 lb 6.8 oz)  Physical Exam: General: obese, awake, NAD, soft spoken but appropriate  Heart: RRR no rub Lungs:  CTA bilat. non labored breathing  Abdomen: soft BS Pos. Minamally tender epigastric area , nondistended  Extremities: trace bipedal  edema Dialysis Access:  RUA AVGG no bruit or thrill     Dialysis: MWF AF    4h  91.5kg  2/2.25 bath  Hep 6000  RUA AVGG Hec 71mcg IV/HD aranesp 60 q week /HD Venofer 50mg  q wkkly Hd  Other op labs HGB  10.9/  pth 409  Recent access hx: 12/14 - new L upper AVG 5/16 -   new R forearm AVG 9/16 -   new R upper arm AVG (current access) 9/16 - declot AVG , VVS 1/17 - declot AVG, [facility] access center 7/17 - shuntogram for low access flows, PTA done ([facility] access ctr)    Impression:   1  Clotted RUA AVGG - IR to attempt declot tomorrow 2  Hyperkalemia - better, will do HD again today 2  AMS - due to narcotics, resolved 3  Acute diverticulitis w abscess - on IV antibiotics, improving 4  ESRD - MWF via [place]- last  k 5.0 on 8/07 on schedule today after declotting by IR 5  Hypertension/volume - BP's stable this am , norvasc on hold, up 3 kg by wts =bedwt  6  Anemia of ESRD - hg 10.9 > 9.6  >9.2> 10.2 continue weekly ESa and fe  7  Metabolic bone disease - corec ca 9.7 phos 6.9  On hec q hd / When eating = sensipar and binder tums ex 2 ac   8  MM= "reported in Remission " 9  Chronic back pain 10  IDDM= per admit  11  Left adrenal mass /Splenic lesion= on CT Admit team wu  12  Nutrition -  (Carb mod /renal diet when  eating Renal vit ) when eating     Plan- HD today, get K down further.  IR planning declot tomorrow.    Kelly Splinter MD [facility] pager [phone]    cell [phone] 01/12/2016, 12:38 PM        Labs: Basic Metabolic Panel:  Recent Labs Lab 01/06/16 0745 01/09/16 0715 01/11/16 1310 01/12/16 0712  NA 133* 132* 131* 133*  K 4.1 5.0 6.5* 5.1  CL 95* 94* 97* 93*  CO2 25 22 19* 25  GLUCOSE 118* 102* 116* 150*  BUN 37* 74* 114* 53*  CREATININE 8.27* 9.30* 11.98* 7.24*  CALCIUM 7.6* 8.4* 8.1* 8.1*  PHOS 6.2* 6.9*  --  7.0*   Liver Function Tests:  Recent Labs Lab 01/06/16 0745 01/09/16 0715 01/12/16 0712  ALBUMIN 2.2* 2.3* 2.2*   No results for input(s): LIPASE, AMYLASE in the last 168 hours. No results for input(s): AMMONIA in the last 168 hours. CBC:  Recent Labs Lab 01/06/16 0745 01/07/16 1043 01/09/16 0715 01/11/16  1310 01/12/16 0712  WBC 21.9* 18.3* 12.2* 12.2* 11.7*  NEUTROABS  --   --   --  9.1* 9.4*  HGB 9.6* 9.8* 10.2* 10.4* 9.2*  HCT 29.2* 31.1* 30.6* 31.2* 28.0*  MCV 93.9 92.0 91.9 92.3 91.2  PLT 258 275 307 346 245   Cardiac Enzymes: No results for input(s): CKTOTAL, CKMB, CKMBINDEX, TROPONINI in the last 168 hours. CBG:  Recent Labs Lab 01/11/16 1455 01/11/16 2108 01/12/16 0536 01/12/16 0758 01/12/16 1202  GLUCAP 132* 123* 124* 137* 140*    Studies/Results: Ir US Guide Vasc Access Right  Result Date: 01/11/2016 INDICATION: 82 year old female with a history of hyperkalemia and thrombosed dialysis graft. Plan is to place temporary hemodialysis catheter for hemodialysis session and treat the elevated potassium. Interval attempt at dialysis graft declot. EXAM: CENTRAL VENOUS CATHETER WITH FLUOROSCOPY; IR ULTRASOUND GUIDANCE VASC ACCESS RIGHT MEDICATIONS: None ANESTHESIA/SEDATION: None FLUOROSCOPY TIME:  Fluoroscopy Time: 0 minutes 12 seconds (0.7 mGy). COMPLICATIONS: None PROCEDURE: Informed written consent was obtained  from the patient and the patient's family after a thorough discussion of the procedural risks, benefits and alternatives. All questions were addressed. A timeout was performed prior to the initiation of the procedure. The right neck and chest was prepped with chlorhexidine, and draped in the usual sterile fashion using maximum barrier technique (cap and mask, sterile gown, sterile gloves, large sterile sheet, hand hygiene and cutaneous antiseptic). Local anesthesia was attained by infiltration with 1% lidocaine without epinephrine. Ultrasound demonstrated patency of the right internal jugular vein, and this was documented with an image. Under real-time ultrasound guidance, this vein was accessed with a 21 gauge micropuncture needle and image documentation was performed. A small dermatotomy was made at the access site with an 11 scalpel. A 0.018" wire was advanced into the SVC and the access needle exchanged for a 60F micropuncture vascular sheath. The 0.018" wire was then removed and a 0.035" wire advanced into the IVC. Upon withdrawal of the 018 wire, the wire was marked for appropriate length of the internal portion of the catheter. A 19 cm catheter was selected. Skin and subcutaneous tissues were serially dilated. Catheter was placed on the wire. The catheter tip is positioned in the upper right atrium. This was documented with a spot image. Both ports of the hemodialysis catheter were then tested for excellent function. The ports were then locked with heparinized lock. Patient tolerated the procedure well and remained hemodynamically stable throughout. No complications were encountered and no significant blood loss was encountered. IMPRESSION: Status post right IJ temporary hemodialysis catheter. Catheter ready for use. Signed, Dulcy Fanny. Earleen Newport, DO [facility] Radiology Electronically Signed   By: Corrie Mckusick D.O.   On: 01/11/2016 17:45   Ir Fluoro Guide Cv  Midline Picc Right  Result Date: 01/11/2016 INDICATION: 82 year old female with a history of hyperkalemia and thrombosed dialysis graft. Plan is to place temporary hemodialysis catheter for hemodialysis session and treat the elevated potassium. Interval attempt at dialysis graft declot. EXAM: CENTRAL VENOUS CATHETER WITH FLUOROSCOPY; IR ULTRASOUND GUIDANCE VASC ACCESS RIGHT MEDICATIONS: None ANESTHESIA/SEDATION: None FLUOROSCOPY TIME:  Fluoroscopy Time: 0 minutes 12 seconds (0.7 mGy). COMPLICATIONS: None PROCEDURE: Informed written consent was obtained from the patient and the patient's family after a thorough discussion of the procedural risks, benefits and alternatives. All questions were addressed. A timeout was performed prior to the initiation of the procedure. The right neck and chest was prepped with chlorhexidine, and draped in the usual sterile fashion using  maximum barrier technique (cap and mask, sterile gown, sterile gloves, large sterile sheet, hand hygiene and cutaneous antiseptic). Local anesthesia was attained by infiltration with 1% lidocaine without epinephrine. Ultrasound demonstrated patency of the right internal jugular vein, and this was documented with an image. Under real-time ultrasound guidance, this vein was accessed with a 21 gauge micropuncture needle and image documentation was performed. A small dermatotomy was made at the access site with an 11 scalpel. A 0.018" wire was advanced into the SVC and the access needle exchanged for a 24F micropuncture vascular sheath. The 0.018" wire was then removed and a 0.035" wire advanced into the IVC. Upon withdrawal of the 018 wire, the wire was marked for appropriate length of the internal portion of the catheter. A 19 cm catheter was selected. Skin and subcutaneous tissues were serially dilated. Catheter was placed on the wire. The catheter tip is positioned in the upper right atrium. This was documented with a spot image. Both ports of the  hemodialysis catheter were then tested for excellent function. The ports were then locked with heparinized lock. Patient tolerated the procedure well and remained hemodynamically stable throughout. No complications were encountered and no significant blood loss was encountered. IMPRESSION: Status post right IJ temporary hemodialysis catheter. Catheter ready for use. Signed, Dulcy Fanny. Earleen Newport, DO [facility] Radiology Electronically Signed   By: Corrie Mckusick D.O.   On: 01/11/2016 17:45   Medications:   . calcium carbonate  2 tablet Oral TID WC  . cinacalcet  30 mg Oral Q breakfast  . ciprofloxacin  500 mg Oral Q24H  . darbepoetin (ARANESP) injection - DIALYSIS  100 mcg Intravenous Q Wed-HD  . doxercalciferol  4 mcg Intravenous Q M,W,F-HD  . heparin  5,000 Units Subcutaneous Q8H  . insulin aspart  0-9 Units Subcutaneous TID WC  . insulin detemir  5 Units Subcutaneous Q2200  . levothyroxine  100 mcg Oral QAC breakfast  . metroNIDAZOLE  500 mg Oral Q8H  . oxyCODONE  10 mg Oral Q12H  . sodium chloride flush  10-40 mL Intracatheter Q12H

## 2016-01-12 NOTE — Progress Notes (Signed)
Pt was slow to respond this am she only would answer hummhumm, no other response. Rt arm had a slight tremor also. CBG check 124 after wiping her face with cool wash cloth she seem to become more alert and track with eyes to my voice. When I asked where she was she then stated in hospital. I gave her meds in applesauce with no problem and tremors ceased they lasted about 5 minutes will continue to monitor. Arthor Captain LPN

## 2016-01-13 ENCOUNTER — Inpatient Hospital Stay (HOSPITAL_COMMUNITY): Payer: Medicare Other

## 2016-01-13 ENCOUNTER — Encounter (HOSPITAL_COMMUNITY): Payer: Self-pay | Admitting: Interventional Radiology

## 2016-01-13 HISTORY — PX: IR GENERIC HISTORICAL: IMG1180011

## 2016-01-13 LAB — BASIC METABOLIC PANEL
ANION GAP: 13 (ref 5–15)
BUN: 24 mg/dL — ABNORMAL HIGH (ref 6–20)
CALCIUM: 8.5 mg/dL — AB (ref 8.9–10.3)
CO2: 28 mmol/L (ref 22–32)
Chloride: 94 mmol/L — ABNORMAL LOW (ref 101–111)
Creatinine, Ser: 4.45 mg/dL — ABNORMAL HIGH (ref 0.44–1.00)
GFR, EST AFRICAN AMERICAN: 10 mL/min — AB (ref >60)
GFR, EST NON AFRICAN AMERICAN: 8 mL/min — AB (ref >60)
GLUCOSE: 105 mg/dL — AB (ref 65–99)
POTASSIUM: 4 mmol/L (ref 3.5–5.1)
Sodium: 135 mmol/L (ref 135–145)

## 2016-01-13 LAB — CBC WITH DIFFERENTIAL/PLATELET
BASOS ABS: 0 10*3/uL (ref 0.0–0.1)
BASOS PCT: 0 %
EOS PCT: 1 %
Eosinophils Absolute: 0.1 10*3/uL (ref 0.0–0.7)
HCT: 29.9 % — ABNORMAL LOW (ref 36.0–46.0)
Hemoglobin: 9.9 g/dL — ABNORMAL LOW (ref 12.0–15.0)
LYMPHS PCT: 13 %
Lymphs Abs: 1.4 10*3/uL (ref 0.7–4.0)
MCH: 30.2 pg (ref 26.0–34.0)
MCHC: 33.1 g/dL (ref 30.0–36.0)
MCV: 91.2 fL (ref 78.0–100.0)
MONO ABS: 1.9 10*3/uL — AB (ref 0.1–1.0)
Monocytes Relative: 17 %
NEUTROS ABS: 7.4 10*3/uL (ref 1.7–7.7)
Neutrophils Relative %: 69 %
PLATELETS: 297 10*3/uL (ref 150–400)
RBC: 3.28 MIL/uL — AB (ref 3.87–5.11)
RDW: 16.7 % — AB (ref 11.5–15.5)
WBC: 10.9 10*3/uL — AB (ref 4.0–10.5)

## 2016-01-13 LAB — MAGNESIUM: MAGNESIUM: 2.1 mg/dL (ref 1.7–2.4)

## 2016-01-13 LAB — GLUCOSE, CAPILLARY
GLUCOSE-CAPILLARY: 88 mg/dL (ref 65–99)
GLUCOSE-CAPILLARY: 147 mg/dL — AB (ref 65–99)
GLUCOSE-CAPILLARY: 124 mg/dL — AB (ref 65–99)
Glucose-Capillary: 122 mg/dL — ABNORMAL HIGH (ref 65–99)

## 2016-01-13 LAB — PHOSPHORUS: PHOSPHORUS: 5.2 mg/dL — AB (ref 2.5–4.6)

## 2016-01-13 LAB — SURGICAL PCR SCREEN
MRSA, PCR: NEGATIVE
STAPHYLOCOCCUS AUREUS: NEGATIVE

## 2016-01-13 MED ORDER — FENTANYL CITRATE (PF) 100 MCG/2ML IJ SOLN
INTRAMUSCULAR | Status: AC | PRN
  Administered 2016-01-13 (×3): 25 ug via INTRAVENOUS

## 2016-01-13 MED ORDER — IOPAMIDOL (ISOVUE-300) INJECTION 61%
INTRAVENOUS | Status: AC
  Filled 2016-01-13: qty 100

## 2016-01-13 MED ORDER — SODIUM CHLORIDE 0.9 % IV SOLN
INTRAVENOUS | Status: AC | PRN
  Administered 2016-01-13: 10 mL/h via INTRAVENOUS

## 2016-01-13 MED ORDER — HEPARIN SODIUM (PORCINE) 5000 UNIT/ML IJ SOLN
5000.0000 [IU] | Freq: Three times a day (TID) | INTRAMUSCULAR | Status: DC
  Administered 2016-01-14 (×2): 5000 [IU] via SUBCUTANEOUS
  Filled 2016-01-13 (×2): qty 1

## 2016-01-13 MED ORDER — HEPARIN SODIUM (PORCINE) 1000 UNIT/ML IJ SOLN
INTRAMUSCULAR | Status: AC
  Filled 2016-01-13: qty 1

## 2016-01-13 MED ORDER — HEPARIN SODIUM (PORCINE) 1000 UNIT/ML IJ SOLN
INTRAMUSCULAR | Status: AC | PRN
  Administered 2016-01-13: 3000 [IU] via INTRAVENOUS

## 2016-01-13 MED ORDER — ALTEPLASE 100 MG IV SOLR
INTRAVENOUS | Status: AC | PRN
  Administered 2016-01-13: 2 mg

## 2016-01-13 MED ORDER — MIDAZOLAM HCL 2 MG/2ML IJ SOLN
INTRAMUSCULAR | Status: AC
  Filled 2016-01-13: qty 2

## 2016-01-13 MED ORDER — FENTANYL CITRATE (PF) 100 MCG/2ML IJ SOLN
INTRAMUSCULAR | Status: AC
  Filled 2016-01-13: qty 2

## 2016-01-13 MED ORDER — LIDOCAINE HCL 1 % IJ SOLN
INTRAMUSCULAR | Status: AC
  Filled 2016-01-13: qty 20

## 2016-01-13 MED ORDER — DOXERCALCIFEROL 4 MCG/2ML IV SOLN
4.0000 ug | Freq: Once | INTRAVENOUS | Status: AC
  Administered 2016-01-14: 4 ug via INTRAVENOUS

## 2016-01-13 MED ORDER — MIDAZOLAM HCL 2 MG/2ML IJ SOLN
INTRAMUSCULAR | Status: AC | PRN
  Administered 2016-01-13: 1 mg via INTRAVENOUS
  Administered 2016-01-13: 0.5 mg via INTRAVENOUS

## 2016-01-13 MED ORDER — ALTEPLASE 2 MG IJ SOLR
INTRAMUSCULAR | Status: AC
  Filled 2016-01-13: qty 2

## 2016-01-13 NOTE — Sedation Documentation (Signed)
Pt states buttocks is hurting

## 2016-01-13 NOTE — Procedures (Signed)
RUE AVG declot PTA venous 6 mm No comp/EBL

## 2016-01-13 NOTE — Sedation Documentation (Signed)
Patient is resting comfortably. 

## 2016-01-13 NOTE — Sedation Documentation (Addendum)
Moved to IR 1, prep begun. O2 2l/[place] applied

## 2016-01-13 NOTE — Sedation Documentation (Addendum)
Patient is resting comfortably.  O2 d/c'd

## 2016-01-13 NOTE — Progress Notes (Signed)
Physical Therapy Treatment Patient Details Name: [name] MRN: [id number] DOB: 12-29-1932 Today's Date: 01/13/2016    History of Present Illness 82 y.o. female admitted to [facility] on 01/09/2016 for lower abdominal pain.  Pt found to have diverticulitis with abscess.  Pt refusing surgical intervention and being treated medically with antibiotics.  Pt with significant PMhx of DM2, PE, neuropathy, HTN, gout, ESRD on HD MWF, COPD, chronic lower back pain, and anemia.  Of note, on 01/09/16 during HD treatment, rapid response was called because pt became lethargic and less responsive.  Stat CT ordered and showed no acute events.      PT Comments    Pt performed transfer training with PTA while OT focused on ADLs in seated position.  Pt remains weak and would benefit from toileting on BSC vs using bed pad.  Educated patient to toilet with staff out of bed.    Follow Up Recommendations  SNF     Equipment Recommendations  Wheelchair (measurements PT);Wheelchair cushion (measurements PT);Hospital bed;Other (comment)    Recommendations for Other Services OT consult     Precautions / Restrictions Precautions Precautions: Fall Precaution Comments: R arm from IR procedure Restrictions Weight Bearing Restrictions: No Other Position/Activity Restrictions: restricted bil UEs due to h/o bil AV fistulas, BPs being taken in legs.    Mobility  Bed Mobility Overal bed mobility: Needs Assistance;+2 for physical assistance Bed Mobility: Supine to Sit     Supine to sit: Max assist;+2 for physical assistance     General bed mobility comments: +2 max A to raise trunk, advance BLEs, posterior lean in sitting requiring mod A to maintain.  Pt required max assist with bed pad to scoot edge of bed.    Transfers Overall transfer level: Needs assistance Equipment used: None Transfers: Sit to/from Stand Sit to Stand: Mod assist;+2 physical assistance;Min assist         General transfer comment: +2 mod  with bed pad to boost hip from bed into standing at stedy frame.  +2 min from stedy frame seat.  Pt performed x3 from stedy seat and x1 from bed.  Cues for hand placement and forward weight shifting.    Ambulation/Gait Ambulation/Gait assistance:  (not performed.  )               Stairs            Wheelchair Mobility    Modified Rankin (Stroke Patients Only)       Balance     Sitting balance-Leahy Scale: Poor       Standing balance-Leahy Scale: Zero                      Cognition Arousal/Alertness: Lethargic Behavior During Therapy: Flat affect Overall Cognitive Status: Difficult to assess                      Exercises      General Comments        Pertinent Vitals/Pain Faces Pain Scale: Hurts little more Pain Location: bottom, reinforced with new mepilex border.   Pain Descriptors / Indicators: Tender Pain Intervention(s): Limited activity within patient's tolerance;Repositioned    Home Living                      Prior Function            PT Goals (current goals can now be found in the care plan section) Acute Rehab PT  Goals Patient Stated Goal: none stated Potential to Achieve Goals: Fair Progress towards PT goals: Progressing toward goals    Frequency  Min 2X/week    PT Plan Frequency needs to be updated    Co-evaluation PT/OT/SLP Co-Evaluation/Treatment: Yes Reason for Co-Treatment: Complexity of the patient's impairments (multi-system involvement);For patient/therapist safety PT goals addressed during session: Mobility/safety with mobility       End of Session Equipment Utilized During Treatment: Gait belt Activity Tolerance: Patient limited by fatigue;Patient limited by lethargy Patient left: with call bell/phone within reach;in chair;with chair alarm set     Time: [id number] PT Time Calculation (min) (ACUTE ONLY): 28 min  Charges:  $Therapeutic Activity: 8-22 mins                    G Codes:       Cristela Blue 02/03/16, 3:49 PM  Governor Rooks, PTA pager [phone]

## 2016-01-13 NOTE — Sedation Documentation (Signed)
Pain with baloon

## 2016-01-13 NOTE — Progress Notes (Signed)
Pt back from IR.  Denies pain.  No s/s of any acute distress.

## 2016-01-13 NOTE — Clinical Social Work Note (Signed)
Clinical Social Worker received notification from patient daughter, Mariann Laster, at bedside who states that patient is now agreeable to SNF placement at [facility] since she is unable to walk.  Per MD note, patient to go to IR today with short HD tomorrow and resume regular M,W,F schedule following.  CSW updated facility and will plan for Monday discharge after HD.  CSW remains available for support and to facilitate patient discharge needs once medically stable.  Barbette Or, [place]

## 2016-01-13 NOTE — Progress Notes (Signed)
Subjective:   abd pain resolved, eating some. Unable to walk now, can sit up on side of bed. Was walking at home.   Objective Vital signs in last 24 hours: Vitals:   01/12/16 1723 01/12/16 1827 01/12/16 2225 01/13/16 0430  BP: 107/60 114/66 (!) 126/53 (!) 164/56  Pulse: 90 89 77 80  Resp: 18 17 16 18   Temp: 98 F (36.7 C) 98.8 F (37.1 C) 98.9 F (37.2 C) 98.8 F (37.1 C)  TempSrc: Oral Oral Oral Oral  SpO2: 98% 99% 100% 100%  Weight: 89.9 kg (198 lb 3.1 oz)   89.9 kg (198 lb 3.1 oz)  Height:       Weight change: -2.2 kg (-4 lb 13.6 oz)  Physical Exam: General: obese, awake, NAD, soft spoken but appropriate  Heart: RRR no rub Lungs:  CTA bilat. non labored breathing  Abdomen: soft BS Pos. [place] tender epigastric area , nondistended  Extremities: trace bipedal  edema Dialysis Access:  RUA AVGG no bruit or thrill     Dialysis: MWF AF    4h  91.5kg  2/2.25 bath  Hep 6000  RUA AVGG Hec 50mcg IV/HD aranesp 60 q week /HD Venofer 50mg  q wkkly Hd  Other op labs HGB  10.9/  pth 409  Recent access hx: 12/14 - new L upper AVG 5/16 -   new R forearm AVG 9/16 -   new R upper arm AVG (current access) 9/16 - declot AVG , VVS 1/17 - declot AVG, [facility] 7/17 - shuntogram for low access flows, PTA done ([facility] access ctr)    Impression:   1  Clotted RUA AVGG - IR to attempt declot tomorrow 2  Acute diverticulitis w abscess - on IV antibiotics, WBC down 3  Debility - walking at home , unable to walk here. PT rec'd SNF but pt wants to go home. 4  ESRD - MWF hd. Is off sched due to AVG clotting 5  Volume - under dry wt 1-2kg 6  Anemia of ESRD - hg 10.9 > 9.6  >9.2> 10.2 continue weekly ESa and fe  7  MBD cont sensipar and binder tums ex 2 ac when eating  8  Hx myeloma in remission 9  Chronic back pain 10  IDDM= on insuline 11  Left adrenal mass /Splenic lesion= on CT Admit team wu  12  Nutrition -  (Carb mod /renal diet when eating Renal vit ) when eating      Plan- Short HD tomorrow off sched, then resume MWF. No UF as below dry wt. IR planning declot today.    Kelly Splinter MD [facility] pager [phone]    cell [phone] 01/13/2016, 11:02 AM     Labs: Basic Metabolic Panel:  Recent Labs Lab 01/09/16 0715 01/11/16 1310 01/12/16 0712 01/13/16 0544  NA 132* 131* 133* 135  K 5.0 6.5* 5.1 4.0  CL 94* 97* 93* 94*  CO2 22 19* 25 28  GLUCOSE 102* 116* 150* 105*  BUN 74* 114* 53* 24*  CREATININE 9.30* 11.98* 7.24* 4.45*  CALCIUM 8.4* 8.1* 8.1* 8.5*  PHOS 6.9*  --  7.0* 5.2*   Liver Function Tests:  Recent Labs Lab 01/09/16 0715 01/12/16 0712  ALBUMIN 2.3* 2.2*   No results for input(s): LIPASE, AMYLASE in the last 168 hours. No results for input(s): AMMONIA in the last 168 hours. CBC:  Recent Labs Lab 01/07/16 1043 01/09/16 0715 01/11/16 1310 01/12/16 0712 01/13/16 0544  WBC 18.3* 12.2*  12.2* 11.7* 10.9*  NEUTROABS  --   --  9.1* 9.4* 7.4  HGB 9.8* 10.2* 10.4* 9.2* 9.9*  HCT 31.1* 30.6* 31.2* 28.0* 29.9*  MCV 92.0 91.9 92.3 91.2 91.2  PLT 275 307 346 245 297   Cardiac Enzymes: No results for input(s): CKTOTAL, CKMB, CKMBINDEX, TROPONINI in the last 168 hours. CBG:  Recent Labs Lab 01/12/16 0758 01/12/16 1202 01/12/16 1846 01/12/16 2219 01/13/16 0735  GLUCAP 137* 140* 150* 147* 122*    Medications:   . calcium carbonate  2 tablet Oral TID WC  . cinacalcet  30 mg Oral Q breakfast  . ciprofloxacin  500 mg Oral Q24H  . darbepoetin (ARANESP) injection - DIALYSIS  100 mcg Intravenous Q Wed-HD  . doxercalciferol  4 mcg Intravenous Q M,W,F-HD  . [START ON 01/14/2016] heparin  5,000 Units Subcutaneous Q8H  . insulin aspart  0-9 Units Subcutaneous TID WC  . insulin detemir  5 Units Subcutaneous Q2200  . levothyroxine  100 mcg Oral QAC breakfast  . metroNIDAZOLE  500 mg Oral Q8H  . oxyCODONE  10 mg Oral Q12H  . sodium chloride flush  10-40 mL Intracatheter Q12H

## 2016-01-13 NOTE — Sedation Documentation (Signed)
Awaiting MD 

## 2016-01-13 NOTE — Sedation Documentation (Addendum)
Transport here, back to 6N, tolerated well. No pain once back on bed.

## 2016-01-13 NOTE — Progress Notes (Signed)
PROGRESS NOTE    [name]  ATF:[id number] DOB: 1933/03/17 DOA: 01/16/2016 PCP: [name], MD    Brief Narrative:  [name] is a 82 y.o. female with a Past Medical History of Anemia, Asthma, COPD, ESRD on dialysis, GERD, HLD, HTN, Hypothyroid, MM, DM, PE, DM,  who presents with diverticulitis w/ likely early abscess. Pt against surgery and has been improving. Clot in AVG. Using temp HD cath in R IJ. IR to declot graft.  Today's plan:  Cont PO abx.  Assessment & Plan:   Principal Problem:   Diverticular disease of intestine with perforation and abscess Active Problems:   Diabetes mellitus with complication (HCC)   End stage renal disease ([place])   Multiple myeloma (HCC)   Hypertension   Diabetes mellitus type 2, insulin dependent (HCC)   Absolute anemia   Hypothyroidism   Chronic low back pain   Left adrenal mass (HCC)   Splenic lesion  Acute diverticulitis with evolving abscess (improving) : Admitted for IV antibiotics, initially was NPO, as her pain is improving, leukocytosis improving, started her on clear liquid diet. She was able to tolerate clear liquid diet without nausea ,vomiting or worsening abdominal pain, advanced her diet to full liquid diet> soft diet .  Improving leukocytosis. IR consulted for aspiration and drain placement, but couldn't do as it was not accesible.  Surgery consulted, recommended watching on IV antibiotics and signed off. Pt refusing any surgery, her abdominal pain improving again today Pain control,.  Plan is d/c home when medically stable. Will need H/H PT, H/H OT and other svcs.  ESRD On HD Further recommendations as per renal.  HD on MWF.  AVG clot, IR to try to remove tomorrow; was going to happen 8/9 but K+ was to high  Hypertension:  Controlled today.  Diabetes Mellitus: CBG (last 3)   Recent Labs  01/13/16 0735 01/13/16 1102 01/13/16 1702  GLUCAP 122* 88 147*    Was on dextrose fluids, which were  discontinued as she is able to tolerate liquid diet.Marland Kitchen Resumed SSI.    Hypothyroidism: Cont resumed synthroid.   MM: In remission.    Left adrenal mass, increased in size from previous CT scan MRI of the abdomen couldn't be done as she couldn't hold her breath because of her abdominal pain, deferred the test to later when her diverticulitis is resolved.  Please obtain an MRI of the abdomen in am, hopefully she will be able to hold her breath.    Chronic low back pain:  She was initially started on IV morphine PCA, since she was able to tolerate oral, changed to her home meds and IV morphine as needed.   Decreased the dose of oxycodone to 10 mg twice daily.   Acute Encephalopathy: resolved.   On the night of 8/1 pt had a brief episode of delirium from fever. Initial CT head negative, EEG done does not show any epileptiform activity.  MRI brain without contrast ordered for further evaluation, which was unremarkable.  On the morning of 8/7 in HD, pt had a brief episode of altered mental status, of unclear etiology, was able to move all extremities, but responding minimally to questions, ordered a repeat Stat CT head, which was unremarkable.  After 30 minutes, she is at her baseline. Suspect it is probably from the pain medications, decreased the dose of oxycontin and oxycodone.  No more episodes.   Mild hyponatremia;  Monitor. From ESRD. Stable.  Anemia of chronic disease Stable  between 9 to 10.    DVT prophylaxis: (Lovenox) Code Status: DNR Family Communication: none at bedside.  Disposition Plan: SNF when stable.    Consultants:   IR consult   Surgery.   Nephrology   Procedures:   MRI of the brain without contrast.   HD  RIJ place 8/9 temp HD cath   Antimicrobials:IV ciprofloxacin and IV flagyl 8/1   Subjective: No new complaints.   Objective: Vitals:   01/13/16 1342 01/13/16 1345 01/13/16 1400 01/13/16 1415  BP: (!) 140/57 (!) 150/53 (!) 152/70 136/62    Pulse: 74 74 74 74  Resp: '14 19 16 16  '$ Temp:      TempSrc:      SpO2: 100% 100% 97% 98%  Weight:      Height:        Intake/Output Summary (Last 24 hours) at 01/13/16 2003 Last data filed at 01/13/16 1608  Gross per 24 hour  Intake              480 ml  Output                0 ml  Net              480 ml   Filed Weights   01/12/16 1346 01/12/16 1723 01/13/16 0430  Weight: 92.9 kg (204 lb 12.9 oz) 89.9 kg (198 lb 3.1 oz) 89.9 kg (198 lb 3.1 oz)    Examination:  General exam:  Alert and oriented. NAD Respiratory system: Clear to auscultation. Respiratory effort normal. No wheezing  Or rhonchi Cardiovascular system: S1 & S2 heard, RRR. No JVD, murmurs, rubs, gallops or clicks. No pedal edema. No AVG bruit heard. Gastrointestinal system: Abdomen is nondistended, soft , mild gen tenderness. No organomegaly or masses felt. Normal bowel sounds heard. Central nervous system: Alert and appears oriented. Extremities:  Trace pedal edema Skin: No rashes, lesions or ulcers     Data Reviewed: I have personally reviewed following labs and imaging studies  CBC:  Recent Labs Lab 01/07/16 1043 01/09/16 0715 01/11/16 1310 01/12/16 0712 01/13/16 0544  WBC 18.3* 12.2* 12.2* 11.7* 10.9*  NEUTROABS  --   --  9.1* 9.4* 7.4  HGB 9.8* 10.2* 10.4* 9.2* 9.9*  HCT 31.1* 30.6* 31.2* 28.0* 29.9*  MCV 92.0 91.9 92.3 91.2 91.2  PLT 275 307 346 245 509   Basic Metabolic Panel:  Recent Labs Lab 01/09/16 0715 01/11/16 1310 01/12/16 0712 01/13/16 0544  NA 132* 131* 133* 135  K 5.0 6.5* 5.1 4.0  CL 94* 97* 93* 94*  CO2 22 19* 25 28  GLUCOSE 102* 116* 150* 105*  BUN 74* 114* 53* 24*  CREATININE 9.30* 11.98* 7.24* 4.45*  CALCIUM 8.4* 8.1* 8.1* 8.5*  MG  --   --   --  2.1  PHOS 6.9*  --  7.0* 5.2*   GFR: Estimated Creatinine Clearance: 11 mL/min (by C-G formula based on SCr of 4.45 mg/dL). Liver Function Tests:  Recent Labs Lab 01/09/16 0715 01/12/16 0712  ALBUMIN 2.3* 2.2*    No results for input(s): LIPASE, AMYLASE in the last 168 hours. No results for input(s): AMMONIA in the last 168 hours. Coagulation Profile:  Recent Labs Lab 01/11/16 1310  INR 1.38   Cardiac Enzymes: No results for input(s): CKTOTAL, CKMB, CKMBINDEX, TROPONINI in the last 168 hours. BNP (last 3 results) No results for input(s): PROBNP in the last 8760 hours. HbA1C: No results for input(s): HGBA1C in the last 72  hours. CBG:  Recent Labs Lab 01/12/16 1846 01/12/16 2219 01/13/16 0735 01/13/16 1102 01/13/16 1702  GLUCAP 150* 147* 122* 88 147*   Lipid Profile: No results for input(s): CHOL, HDL, LDLCALC, TRIG, CHOLHDL, LDLDIRECT in the last 72 hours. Thyroid Function Tests: No results for input(s): TSH, T4TOTAL, FREET4, T3FREE, THYROIDAB in the last 72 hours. Anemia Panel: No results for input(s): VITAMINB12, FOLATE, FERRITIN, TIBC, IRON, RETICCTPCT in the last 72 hours. Sepsis Labs: No results for input(s): PROCALCITON, LATICACIDVEN in the last 168 hours.  Recent Results (from the past 240 hour(s))  Surgical pcr screen     Status: None   Collection Time: 01/13/16  2:54 AM  Result Value Ref Range Status   MRSA, PCR NEGATIVE NEGATIVE Final   Staphylococcus aureus NEGATIVE NEGATIVE Final    Comment:        The Xpert SA Assay (FDA approved for NASAL specimens in patients over 28 years of age), is one component of a comprehensive surveillance program.  Test performance has been validated by [facility] for patients greater than or equal to 80 year old. It is not intended to diagnose infection nor to guide or monitor treatment.          Radiology Studies: Ir US Guide Vasc Access Right  Result Date: 01/13/2016 INDICATION: Thrombosed AVG. EXAM: DIALYSIS GRAFT THROMBECTOMY, ULTRASOUND VENOUS ACCESS, PTA VENOUS MEDICATIONS: None. ANESTHESIA/SEDATION: Moderate Sedation Time:  40 The patient was continuously monitored during the procedure by the interventional  radiology nurse under my direct supervision. Fentanyl 75 mcg.  Versus at 1.5 mg. Heparin 3000 units. FLUOROSCOPY TIME:  Fluoroscopy Time: 7 minutes  seconds (11 mGy). COMPLICATIONS: None immediate. PROCEDURE: Informed written consent was obtained from the patient after a thorough discussion of the procedural risks, benefits and alternatives. All questions were addressed. Maximal Sterile Barrier Technique was utilized including caps, mask, sterile gowns, sterile gloves, sterile drape, hand hygiene and skin antiseptic. A timeout was performed prior to the initiation of the procedure. The right arm was prepped with Betadine in a sterile fashion, and a sterile drape was applied covering the operative field. A sterile gown and sterile gloves were used for the procedure. The graft was noted to be occluded initially with ultrasound. Under sonographic guidance, a micropuncture needle was inserted into the graft (Ultrasound image documentation was performed) directed towards the venous anastomosis. It was removed over a 018 wire. A 5-French transitional dilator was inserted. 1 mg t-PA was injected. The dilator was exchanged over a Benson wire for a 6-French sheath. The identical procedure was performed towards the arterial anastomosis. The Kumpe catheter was utilized to advance the Kumpe catheter across both the arterial and venous anastomoses. Central venography was performed. A 6 mm angioplasty device was utilized to dilate the venous limb of the graft, the venous anastomosis. The Fogarty balloon was then utilized to sweep the arterial and venous limbs of the graft. Final imaging was performed. Sheaths were removed and hemostasis was achieved with two figure-of-eight O Prolene stitches. IMPRESSION: Successful right arm AV graft thrombectomy and dilatation of the venous anastomosis to 6 mm. Electronically Signed   By: Marybelle Killings M.D.   On: 01/13/2016 16:49   Ir Thrombectomy Av Fistula/w Thrombolysis/pta Inc Shunt/img  Right  Result Date: 01/13/2016 INDICATION: Thrombosed AVG. EXAM: DIALYSIS GRAFT THROMBECTOMY, ULTRASOUND VENOUS ACCESS, PTA VENOUS MEDICATIONS: None. ANESTHESIA/SEDATION: Moderate Sedation Time:  40 The patient was continuously monitored during the procedure by the interventional radiology nurse under my direct supervision. Fentanyl 75 mcg.  Versus at 1.5 mg. Heparin 3000 units. FLUOROSCOPY TIME:  Fluoroscopy Time: 7 minutes  seconds (11 mGy). COMPLICATIONS: None immediate. PROCEDURE: Informed written consent was obtained from the patient after a thorough discussion of the procedural risks, benefits and alternatives. All questions were addressed. Maximal Sterile Barrier Technique was utilized including caps, mask, sterile gowns, sterile gloves, sterile drape, hand hygiene and skin antiseptic. A timeout was performed prior to the initiation of the procedure. The right arm was prepped with Betadine in a sterile fashion, and a sterile drape was applied covering the operative field. A sterile gown and sterile gloves were used for the procedure. The graft was noted to be occluded initially with ultrasound. Under sonographic guidance, a micropuncture needle was inserted into the graft (Ultrasound image documentation was performed) directed towards the venous anastomosis. It was removed over a 018 wire. A 5-French transitional dilator was inserted. 1 mg t-PA was injected. The dilator was exchanged over a Benson wire for a 6-French sheath. The identical procedure was performed towards the arterial anastomosis. The Kumpe catheter was utilized to advance the Kumpe catheter across both the arterial and venous anastomoses. Central venography was performed. A 6 mm angioplasty device was utilized to dilate the venous limb of the graft, the venous anastomosis. The Fogarty balloon was then utilized to sweep the arterial and venous limbs of the graft. Final imaging was performed. Sheaths were removed and hemostasis was achieved  with two figure-of-eight O Prolene stitches. IMPRESSION: Successful right arm AV graft thrombectomy and dilatation of the venous anastomosis to 6 mm. Electronically Signed   By: Marybelle Killings M.D.   On: 01/13/2016 16:49        Scheduled Meds: . calcium carbonate  2 tablet Oral TID WC  . cinacalcet  30 mg Oral Q breakfast  . ciprofloxacin  500 mg Oral Q24H  . darbepoetin (ARANESP) injection - DIALYSIS  100 mcg Intravenous Q Wed-HD  . doxercalciferol  4 mcg Intravenous Q M,W,F-HD  . [START ON 01/14/2016] doxercalciferol  4 mcg Intravenous Once  . [START ON 01/14/2016] heparin  5,000 Units Subcutaneous Q8H  . insulin aspart  0-9 Units Subcutaneous TID WC  . insulin detemir  5 Units Subcutaneous Q2200  . iopamidol      . levothyroxine  100 mcg Oral QAC breakfast  . metroNIDAZOLE  500 mg Oral Q8H  . oxyCODONE  10 mg Oral Q12H  . sodium chloride flush  10-40 mL Intracatheter Q12H   Continuous Infusions:     LOS: 10 days    Time spent: 35 minutes.     Elwin Mocha, MD [facility] Pager [phone]  If 7PM-7AM, please contact night-coverage [email or web address] Password [facility] 01/13/2016, 8:03 PM

## 2016-01-13 NOTE — Progress Notes (Signed)
Pt going to IR at this time with transport.  No s/s of any acute distress or pain.

## 2016-01-13 NOTE — Progress Notes (Signed)
Occupational Therapy Treatment Patient Details Name: [name] MRN: [id number] DOB: March 14, 1933 Today's Date: 01/13/2016    History of present illness 82 y.o. female admitted to [facility] on 01/25/2016 for lower abdominal pain.  Pt found to have diverticulitis with abscess.  Pt refusing surgical intervention and being treated medically with antibiotics.  Pt with significant PMhx of DM2, PE, neuropathy, HTN, gout, ESRD on HD MWF, COPD, chronic lower back pain, and anemia.  Of note, on 01/09/16 during HD treatment, rapid response was called because pt became lethargic and less responsive.  Stat CT ordered and showed no acute events.     OT comments  Good participation in OT today.  Goals set this session. Family very involved; however, needed to use stedy for transfers.  Follow Up Recommendations  SNF (would benefit; from notes family plans home:  if so, HHOT)    Equipment Recommendations  3 in 1 bedside comode, possibly hospital bed, if home   Recommendations for Other Services      Precautions / Restrictions Precautions Precautions: Fall Precaution Comments: R arm from IR procedure Restrictions Weight Bearing Restrictions: No Other Position/Activity Restrictions: restricted bil UEs due to h/o bil AV fistulas, BPs being taken in legs.       Mobility Bed Mobility Overal bed mobility: Needs Assistance;+2 for physical assistance Bed Mobility: Supine to Sit     Supine to sit: Max assist;+2 for physical assistance     General bed mobility comments: +2 max A to raise trunk, advance BLEs, posterior lean in sitting requiring mod A to maintain.  Pt required max assist with bed pad to scoot edge of bed.    Transfers Overall transfer level: Needs assistance Equipment used: None Transfers: Sit to/from Stand Sit to Stand: Mod assist;+2 physical assistance;Min assist         General transfer comment: +2 mod with bed pad to boost hip from bed into standing at stedy frame.  +2 min from  stedy frame seat.  Pt performed x3 from stedy seat and x1 from bed.  Cues for hand placement and forward weight shifting.      Balance     Sitting balance-Leahy Scale: Poor       Standing balance-Leahy Scale: Zero                     ADL       Grooming: Wash/dry face;Oral care;Standing (from stedy)                                 General ADL Comments: used stedy to roll to sink in bathroom and get to recliner.  Used chux to assist with standing:  mod A from bed and min A from stedy seat for brushing teeth.   Daughters present and very involved      Tourist information centre manager   Behavior During Therapy: Flat affect Overall Cognitive Status: Difficult to assess     Current Attention Level: Sustained            General Comments: followed one step commands with extra time    Extremity/Trunk Assessment               Exercises     Shoulder Instructions  General Comments      Pertinent Vitals/ Pain       Faces Pain Scale: Hurts little more Pain Location: buttocks Pain Descriptors / Indicators: Tender Pain Intervention(s): Limited activity within patient's tolerance;Monitored during session;Repositioned  Home Living                                          Prior Functioning/Environment              Frequency Min 2X/week     Progress Toward Goals  OT Goals(current goals can now be found in the care plan section)  Progress towards OT goals:  (goals set today)  Acute Rehab OT Goals Patient Stated Goal: none stated OT Goal Formulation: With patient ADL Goals Pt Will Transfer to Toilet: with mod assist;with +2 assist;squat pivot transfer;bedside commode Additional ADL Goal #1: family will safely assist with adls, sit to stand vs rolling to side with mod A +2  Plan      Co-evaluation    PT/OT/SLP Co-Evaluation/Treatment: Yes Reason for Co-Treatment:  Complexity of the patient's impairments (multi-system involvement) PT goals addressed during session: Mobility/safety with mobility OT goals addressed during session: ADL's and self-care      End of Session     Activity Tolerance Patient tolerated treatment well   Patient Left in chair;with call bell/phone within reach;with chair alarm set   Nurse Communication Mobility status;Need for lift equipment        Time: [id number] OT Time Calculation (min): 31 min  Charges: OT General Charges $OT Visit: 1 Procedure OT Treatments $Therapeutic Activity: 23-37 mins  Koi Yarbro 01/13/2016, 4:08 PM  Lesle Chris, OTR/L [phone] 01/13/2016

## 2016-01-13 NOTE — Care Management Important Message (Signed)
Important Message  Patient Details  Name: [name] MRN: [id number] Date of Birth: [birth year]   Medicare Important Message Given:  Yes    Domenica Weightman 01/13/2016, 10:09 AM

## 2016-01-14 DIAGNOSIS — T82868A Thrombosis of vascular prosthetic devices, implants and grafts, initial encounter: Secondary | ICD-10-CM

## 2016-01-14 DIAGNOSIS — L8990 Pressure ulcer of unspecified site, unspecified stage: Secondary | ICD-10-CM | POA: Insufficient documentation

## 2016-01-14 DIAGNOSIS — N186 End stage renal disease: Secondary | ICD-10-CM

## 2016-01-14 LAB — CBC WITH DIFFERENTIAL/PLATELET
BASOS ABS: 0 10*3/uL (ref 0.0–0.1)
BASOS PCT: 0 %
EOS ABS: 0.2 10*3/uL (ref 0.0–0.7)
EOS PCT: 2 %
HCT: 28.2 % — ABNORMAL LOW (ref 36.0–46.0)
Hemoglobin: 9.1 g/dL — ABNORMAL LOW (ref 12.0–15.0)
Lymphocytes Relative: 15 %
Lymphs Abs: 1.4 10*3/uL (ref 0.7–4.0)
MCH: 29.4 pg (ref 26.0–34.0)
MCHC: 32.3 g/dL (ref 30.0–36.0)
MCV: 91.3 fL (ref 78.0–100.0)
Monocytes Absolute: 1.4 10*3/uL — ABNORMAL HIGH (ref 0.1–1.0)
Monocytes Relative: 15 %
Neutro Abs: 6.2 10*3/uL (ref 1.7–7.7)
Neutrophils Relative %: 68 %
PLATELETS: 297 10*3/uL (ref 150–400)
RBC: 3.09 MIL/uL — AB (ref 3.87–5.11)
RDW: 16.4 % — ABNORMAL HIGH (ref 11.5–15.5)
WBC: 9.2 10*3/uL (ref 4.0–10.5)

## 2016-01-14 LAB — GLUCOSE, CAPILLARY
GLUCOSE-CAPILLARY: 122 mg/dL — AB (ref 65–99)
Glucose-Capillary: 135 mg/dL — ABNORMAL HIGH (ref 65–99)
Glucose-Capillary: 82 mg/dL (ref 65–99)

## 2016-01-14 LAB — RENAL FUNCTION PANEL
Albumin: 2.4 g/dL — ABNORMAL LOW (ref 3.5–5.0)
Anion gap: 13 (ref 5–15)
BUN: 35 mg/dL — AB (ref 6–20)
CALCIUM: 8.3 mg/dL — AB (ref 8.9–10.3)
CO2: 26 mmol/L (ref 22–32)
CREATININE: 6.34 mg/dL — AB (ref 0.44–1.00)
Chloride: 95 mmol/L — ABNORMAL LOW (ref 101–111)
GFR, EST AFRICAN AMERICAN: 6 mL/min — AB (ref >60)
GFR, EST NON AFRICAN AMERICAN: 6 mL/min — AB (ref >60)
Glucose, Bld: 84 mg/dL (ref 65–99)
Phosphorus: 5.8 mg/dL — ABNORMAL HIGH (ref 2.5–4.6)
Potassium: 4 mmol/L (ref 3.5–5.1)
SODIUM: 134 mmol/L — AB (ref 135–145)

## 2016-01-14 LAB — CBC
HCT: 28.5 % — ABNORMAL LOW (ref 36.0–46.0)
Hemoglobin: 9.3 g/dL — ABNORMAL LOW (ref 12.0–15.0)
MCH: 29.9 pg (ref 26.0–34.0)
MCHC: 32.6 g/dL (ref 30.0–36.0)
MCV: 91.6 fL (ref 78.0–100.0)
PLATELETS: 268 10*3/uL (ref 150–400)
RBC: 3.11 MIL/uL — AB (ref 3.87–5.11)
RDW: 16.5 % — AB (ref 11.5–15.5)
WBC: 9.8 10*3/uL (ref 4.0–10.5)

## 2016-01-14 LAB — ELECTROLYTE PANEL
Anion gap: 8 (ref 5–15)
CHLORIDE: 99 mmol/L — AB (ref 101–111)
CO2: 27 mmol/L (ref 22–32)
POTASSIUM: 3.7 mmol/L (ref 3.5–5.1)
SODIUM: 134 mmol/L — AB (ref 135–145)

## 2016-01-14 LAB — MAGNESIUM: MAGNESIUM: 2.2 mg/dL (ref 1.7–2.4)

## 2016-01-14 MED ORDER — SODIUM CHLORIDE 0.9 % IV SOLN: 100.0000 mL | INTRAVENOUS | Status: DC | PRN

## 2016-01-14 MED ORDER — PENTAFLUOROPROP-TETRAFLUOROETH EX AERO: 1.0000 "application " | INHALATION_SPRAY | CUTANEOUS | Status: DC | PRN

## 2016-01-14 MED ORDER — HEPARIN SODIUM (PORCINE) 1000 UNIT/ML DIALYSIS
1000.0000 [IU] | INTRAMUSCULAR | Status: DC | PRN
  Filled 2016-01-14: qty 1

## 2016-01-14 MED ORDER — CHLORHEXIDINE GLUCONATE CLOTH 2 % EX PADS
6.0000 | MEDICATED_PAD | Freq: Once | CUTANEOUS | Status: AC
Start: 2016-01-14 — End: 2016-01-14
  Administered 2016-01-14: 6 via TOPICAL

## 2016-01-14 MED ORDER — LIDOCAINE HCL (PF) 1 % IJ SOLN: 5.0000 mL | INTRAMUSCULAR | Status: DC | PRN

## 2016-01-14 MED ORDER — DOXERCALCIFEROL 4 MCG/2ML IV SOLN
INTRAVENOUS | Status: AC
  Filled 2016-01-14: qty 2

## 2016-01-14 MED ORDER — LIDOCAINE-PRILOCAINE 2.5-2.5 % EX CREA
1.0000 "application " | TOPICAL_CREAM | CUTANEOUS | Status: DC | PRN
  Filled 2016-01-14: qty 5

## 2016-01-14 MED ORDER — CHLORHEXIDINE GLUCONATE CLOTH 2 % EX PADS: 6.0000 | MEDICATED_PAD | Freq: Once | CUTANEOUS | Status: DC

## 2016-01-14 MED ORDER — HEPARIN SODIUM (PORCINE) 1000 UNIT/ML DIALYSIS: 3000.0000 [IU] | Freq: Once | INTRAMUSCULAR | Status: DC

## 2016-01-14 MED ORDER — SODIUM CHLORIDE 0.9 % IV SOLN
INTRAVENOUS | Status: DC
  Administered 2016-01-15: 08:00:00 via INTRAVENOUS

## 2016-01-14 MED ORDER — SODIUM CHLORIDE 0.9 % IV SOLN: INTRAVENOUS | Status: DC

## 2016-01-14 MED ORDER — ALTEPLASE 2 MG IJ SOLR: 2.0000 mg | Freq: Once | INTRAMUSCULAR | Status: DC | PRN

## 2016-01-14 MED ORDER — CLINDAMYCIN PHOSPHATE 600 MG/50ML IV SOLN: 600.0000 mg | INTRAVENOUS | Status: DC

## 2016-01-14 NOTE — Progress Notes (Addendum)
PROGRESS NOTE    [name]  [id number] DOB: 1932-08-16 DOA: 02/02/2016 PCP: [name], MD    Brief Narrative:  [name] is a 82 y.o. female with a Past Medical History of Anemia, Asthma, COPD, ESRD on dialysis, GERD, HLD, HTN, Hypothyroid, MM, DM, PE, DM,  who presents with diverticulitis w/ likely early abscess. Pt against surgery and has been improving. Clot in AVG. Using temp HD cath in R IJ. IR to declot graft, 8/11, this failed. Plan for surgery by vascular.  Today's plan:  Cont PO abx. MIVF overnight while NPO.  Assessment & Plan:   Principal Problem:   Diverticular disease of intestine with perforation and abscess Active Problems:   Diabetes mellitus with complication (HCC)   End stage renal disease ([place])   Multiple myeloma (HCC)   Hypertension   Diabetes mellitus type 2, insulin dependent (HCC)   Absolute anemia   Hypothyroidism   Chronic low back pain   Left adrenal mass (HCC)   Splenic lesion   Pressure ulcer  Acute diverticulitis with evolving abscess (improving) : Admitted for IV antibiotics, initially was NPO, as her pain is improving, leukocytosis improving, started her on clear liquid diet. She was able to tolerate clear liquid diet without nausea ,vomiting or worsening abdominal pain, advanced her diet to full liquid diet> soft diet .  Improving leukocytosis. IR consulted for aspiration and drain placement, but couldn't do as it was not accesible.  Surgery consulted, recommended watching on IV antibiotics and signed off. Pt refusing any surgery, her abdominal pain improving again today Pain control,.  Plan is d/c home when medically stable. Will need H/H PT, H/H OT and other svcs.  ESRD On HD Further recommendations as per renal.  HD on MWF.   Hypertension:  Controlled today.  Diabetes Mellitus: CBG (last 3)   Recent Labs  01/13/16 2150 01/14/16 1126 01/14/16 1718  GLUCAP 124* 122* 135*    Was on dextrose fluids,  which were discontinued as she is able to tolerate liquid diet.Marland Kitchen Resumed SSI.    Hypothyroidism: Cont resumed synthroid.   MM: In remission.    Left adrenal mass, increased in size from previous CT scan MRI of the abdomen couldn't be done as she couldn't hold her breath because of her abdominal pain, deferred the test to later when her diverticulitis is resolved.  Please obtain an MRI of the abdomen in am, hopefully she will be able to hold her breath.    Chronic low back pain:  She was initially started on IV morphine PCA, since she was able to tolerate oral, changed to her home meds and IV morphine as needed.   Decreased the dose of oxycodone to 10 mg twice daily.   Acute Encephalopathy: resolved.   On the night of 8/1 pt had a brief episode of delirium from fever. Initial CT head negative, EEG done does not show any epileptiform activity.  MRI brain without contrast ordered for further evaluation, which was unremarkable.  On the morning of 8/7 in HD, pt had a brief episode of altered mental status, of unclear etiology, was able to move all extremities, but responding minimally to questions, ordered a repeat Stat CT head, which was unremarkable.  After 30 minutes, she is at her baseline. Suspect it is probably from the pain medications, decreased the dose of oxycontin and oxycodone.  No more episodes.   Mild hyponatremia;  Monitor. From ESRD. Stable.  Anemia of chronic disease Stable between 9  to 10.    DVT prophylaxis: SCD Code Status: DNR Family Communication: dgtr at bedside Disposition Plan: SNF when stable.    Consultants:   IR consult   Surgery.   Nephrology   Procedures:   MRI of the brain without contrast.   HD  RIJ place 8/9 temp HD cath  IR Thrombectomy 8/11, R AVG   Antimicrobials:IV ciprofloxacin and IV flagyl 8/1   Subjective: No events on tele. No events per nursing. Denies CP. No SOB.  Objective: Vitals:   01/14/16 0830 01/14/16 0900  01/14/16 0935 01/14/16 1418  BP: (!) 136/59 127/60 128/61 (!) 142/75  Pulse: 62 68 67 68  Resp: '18 18 18 18  '$ Temp:   97.6 F (36.4 C) 97.6 F (36.4 C)  TempSrc:   Oral Oral  SpO2:   100% 100%  Weight:      Height:        Intake/Output Summary (Last 24 hours) at 01/14/16 1743 Last data filed at 01/14/16 1500  Gross per 24 hour  Intake              720 ml  Output                0 ml  Net              720 ml   Filed Weights   01/13/16 0430 01/14/16 0616 01/14/16 0700  Weight: 89.9 kg (198 lb 3.1 oz) 92.6 kg (204 lb 2.3 oz) 89.4 kg (197 lb 1.5 oz)    Examination:  General exam:  Alert and oriented. NAD Respiratory system: Clear to auscultation. Respiratory effort normal. No wheezing Or rhonchi Cardiovascular system: S1 & S2 heard, RRR. No JVD, murmurs, rubs, gallops or clicks. No pedal edema. No AVG bruit heard. Gastrointestinal system: Abdomen is nondistended, soft , mild gen tenderness. No organomegaly or masses felt. Normal bowel sounds heard. Central nervous system: Alert and appears oriented. Extremities:  Trace pedal edema Skin: No rashes, lesions; buttock ulcer     Data Reviewed: I have personally reviewed following labs and imaging studies  CBC:  Recent Labs Lab 01/09/16 0715 01/11/16 1310 01/12/16 0712 01/13/16 0544 01/14/16 0448  WBC 12.2* 12.2* 11.7* 10.9* 9.2  NEUTROABS  --  9.1* 9.4* 7.4 6.2  HGB 10.2* 10.4* 9.2* 9.9* 9.1*  HCT 30.6* 31.2* 28.0* 29.9* 28.2*  MCV 91.9 92.3 91.2 91.2 91.3  PLT 307 346 245 297 616   Basic Metabolic Panel:  Recent Labs Lab 01/09/16 0715 01/11/16 1310 01/12/16 0712 01/13/16 0544 01/14/16 0448  NA 132* 131* 133* 135 134*  K 5.0 6.5* 5.1 4.0 4.0  CL 94* 97* 93* 94* 95*  CO2 22 19* '25 28 26  '$ GLUCOSE 102* 116* 150* 105* 84  BUN 74* 114* 53* 24* 35*  CREATININE 9.30* 11.98* 7.24* 4.45* 6.34*  CALCIUM 8.4* 8.1* 8.1* 8.5* 8.3*  MG  --   --   --  2.1 2.2  PHOS 6.9*  --  7.0* 5.2* 5.8*   GFR: Estimated  Creatinine Clearance: 7.7 mL/min (by C-G formula based on SCr of 6.34 mg/dL). Liver Function Tests:  Recent Labs Lab 01/09/16 0715 01/12/16 0712 01/14/16 0448  ALBUMIN 2.3* 2.2* 2.4*   No results for input(s): LIPASE, AMYLASE in the last 168 hours. No results for input(s): AMMONIA in the last 168 hours. Coagulation Profile:  Recent Labs Lab 01/11/16 1310  INR 1.38   Cardiac Enzymes: No results for input(s): CKTOTAL, CKMB, CKMBINDEX, TROPONINI in the  last 168 hours. BNP (last 3 results) No results for input(s): PROBNP in the last 8760 hours. HbA1C: No results for input(s): HGBA1C in the last 72 hours. CBG:  Recent Labs Lab 01/13/16 1102 01/13/16 1702 01/13/16 2150 01/14/16 1126 01/14/16 1718  GLUCAP 88 147* 124* 122* 135*   Lipid Profile: No results for input(s): CHOL, HDL, LDLCALC, TRIG, CHOLHDL, LDLDIRECT in the last 72 hours. Thyroid Function Tests: No results for input(s): TSH, T4TOTAL, FREET4, T3FREE, THYROIDAB in the last 72 hours. Anemia Panel: No results for input(s): VITAMINB12, FOLATE, FERRITIN, TIBC, IRON, RETICCTPCT in the last 72 hours. Sepsis Labs: No results for input(s): PROCALCITON, LATICACIDVEN in the last 168 hours.  Recent Results (from the past 240 hour(s))  Surgical pcr screen     Status: None   Collection Time: 01/13/16  2:54 AM  Result Value Ref Range Status   MRSA, PCR NEGATIVE NEGATIVE Final   Staphylococcus aureus NEGATIVE NEGATIVE Final    Comment:        The Xpert SA Assay (FDA approved for NASAL specimens in patients over 69 years of age), is one component of a comprehensive surveillance program.  Test performance has been validated by [facility] for patients greater than or equal to 43 year old. It is not intended to diagnose infection nor to guide or monitor treatment.          Radiology Studies: Ir US Guide Vasc Access Right  Result Date: 01/13/2016 INDICATION: Thrombosed AVG. EXAM: DIALYSIS GRAFT THROMBECTOMY,  ULTRASOUND VENOUS ACCESS, PTA VENOUS MEDICATIONS: None. ANESTHESIA/SEDATION: Moderate Sedation Time:  40 The patient was continuously monitored during the procedure by the interventional radiology nurse under my direct supervision. Fentanyl 75 mcg.  Versus at 1.5 mg. Heparin 3000 units. FLUOROSCOPY TIME:  Fluoroscopy Time: 7 minutes  seconds (11 mGy). COMPLICATIONS: None immediate. PROCEDURE: Informed written consent was obtained from the patient after a thorough discussion of the procedural risks, benefits and alternatives. All questions were addressed. Maximal Sterile Barrier Technique was utilized including caps, mask, sterile gowns, sterile gloves, sterile drape, hand hygiene and skin antiseptic. A timeout was performed prior to the initiation of the procedure. The right arm was prepped with Betadine in a sterile fashion, and a sterile drape was applied covering the operative field. A sterile gown and sterile gloves were used for the procedure. The graft was noted to be occluded initially with ultrasound. Under sonographic guidance, a micropuncture needle was inserted into the graft (Ultrasound image documentation was performed) directed towards the venous anastomosis. It was removed over a 018 wire. A 5-French transitional dilator was inserted. 1 mg t-PA was injected. The dilator was exchanged over a Benson wire for a 6-French sheath. The identical procedure was performed towards the arterial anastomosis. The Kumpe catheter was utilized to advance the Kumpe catheter across both the arterial and venous anastomoses. Central venography was performed. A 6 mm angioplasty device was utilized to dilate the venous limb of the graft, the venous anastomosis. The Fogarty balloon was then utilized to sweep the arterial and venous limbs of the graft. Final imaging was performed. Sheaths were removed and hemostasis was achieved with two figure-of-eight O Prolene stitches. IMPRESSION: Successful right arm AV graft  thrombectomy and dilatation of the venous anastomosis to 6 mm. Electronically Signed   By: Jolaine Click M.D.   On: 01/13/2016 16:49   Ir Thrombectomy Av Fistula/w Thrombolysis/pta Inc Shunt/img Right  Result Date: 01/13/2016 INDICATION: Thrombosed AVG. EXAM: DIALYSIS GRAFT THROMBECTOMY, ULTRASOUND VENOUS ACCESS, PTA VENOUS MEDICATIONS:  None. ANESTHESIA/SEDATION: Moderate Sedation Time:  40 The patient was continuously monitored during the procedure by the interventional radiology nurse under my direct supervision. Fentanyl 75 mcg.  Versus at 1.5 mg. Heparin 3000 units. FLUOROSCOPY TIME:  Fluoroscopy Time: 7 minutes  seconds (11 mGy). COMPLICATIONS: None immediate. PROCEDURE: Informed written consent was obtained from the patient after a thorough discussion of the procedural risks, benefits and alternatives. All questions were addressed. Maximal Sterile Barrier Technique was utilized including caps, mask, sterile gowns, sterile gloves, sterile drape, hand hygiene and skin antiseptic. A timeout was performed prior to the initiation of the procedure. The right arm was prepped with Betadine in a sterile fashion, and a sterile drape was applied covering the operative field. A sterile gown and sterile gloves were used for the procedure. The graft was noted to be occluded initially with ultrasound. Under sonographic guidance, a micropuncture needle was inserted into the graft (Ultrasound image documentation was performed) directed towards the venous anastomosis. It was removed over a 018 wire. A 5-French transitional dilator was inserted. 1 mg t-PA was injected. The dilator was exchanged over a Benson wire for a 6-French sheath. The identical procedure was performed towards the arterial anastomosis. The Kumpe catheter was utilized to advance the Kumpe catheter across both the arterial and venous anastomoses. Central venography was performed. A 6 mm angioplasty device was utilized to dilate the venous limb of the graft,  the venous anastomosis. The Fogarty balloon was then utilized to sweep the arterial and venous limbs of the graft. Final imaging was performed. Sheaths were removed and hemostasis was achieved with two figure-of-eight O Prolene stitches. IMPRESSION: Successful right arm AV graft thrombectomy and dilatation of the venous anastomosis to 6 mm. Electronically Signed   By: Marybelle Killings M.D.   On: 01/13/2016 16:49        Scheduled Meds: . calcium carbonate  2 tablet Oral TID WC  . cinacalcet  30 mg Oral Q breakfast  . ciprofloxacin  500 mg Oral Q24H  . darbepoetin (ARANESP) injection - DIALYSIS  100 mcg Intravenous Q Wed-HD  . doxercalciferol  4 mcg Intravenous Q M,W,F-HD  . heparin  5,000 Units Subcutaneous Q8H  . insulin aspart  0-9 Units Subcutaneous TID WC  . insulin detemir  5 Units Subcutaneous Q2200  . levothyroxine  100 mcg Oral QAC breakfast  . metroNIDAZOLE  500 mg Oral Q8H  . oxyCODONE  10 mg Oral Q12H  . sodium chloride flush  10-40 mL Intracatheter Q12H   Continuous Infusions:     LOS: 11 days    Time spent: 35 minutes.     Elwin Mocha, MD [facility] Pager [phone]  If 7PM-7AM, please contact night-coverage [email or web address] Password [facility] 01/14/2016, 5:43 PM

## 2016-01-14 NOTE — Progress Notes (Signed)
Pt arrived to unit via transport, no distress noted alert and oriented x 4, pt had declot performed in rt upper arm graft on 01/13/16, while assessing graft no bruit or thrill noted, catheter had to be used

## 2016-01-14 NOTE — Consult Note (Signed)
Hospital Consult    Reason for Consult:  Hd access Referring Physician:  Jonnie Finner, MD MRN #:  [id number]  History of Present Illness: This is a 82 y.o. female with history of ESRD admitted with diverticulitis. Noted to have clotted RUE av graft yesterday and underwent declot by IR with placement of temporary catheter. The graft was unable to be accessed today and appears to be clotted again. Consult for possible open thrombectomy and placement of permanent access. Patient now denies any recent fevers or chills and is tolerating diet.   Past Medical History:  Diagnosis Date  . Anemia   . Arthritis    "hips, knees, shoulders" (11/10/2013)  . Asthma   . Chronic lower back pain   . Constipation   . COPD (chronic obstructive pulmonary disease) ([place])   . ESRD (end stage renal disease) on dialysis [facility])    "1st treatment 11/09/2013" MWF.  [address]  . GERD (gastroesophageal reflux disease)   . Gout   . High cholesterol   . History of blood transfusion 1987; 2011   "related to lung OR; HgB went down to 5"  . Hypertension   . Hypothyroidism   . Kidney stones   . Multiple myeloma ([place])   . Neuropathy ([place])   . Neuropathy in diabetes ([place])    Hx: of  . Pulmonary embolism ([place]) 1987  . Type II diabetes mellitus ([place])     Past Surgical History:  Procedure Laterality Date  . ABDOMINAL HYSTERECTOMY  1964  . ABDOMINOPLASTY  ~ 1983   "tummy tuck"  . APPENDECTOMY  1954  . AV FISTULA PLACEMENT Left 2011   "lower arm; never used"  . AV FISTULA PLACEMENT Left 05/15/2013   Procedure: INSERTION OF ARTERIOVENOUS (AV) GORE-TEX GRAFT ARM-LEFT UPPER ARM;  Surgeon: Mal Misty, MD;  Location: [place];  Service: Vascular;  Laterality: Left;  . AV FISTULA PLACEMENT Right 10/19/2014   Procedure: INSERTION OF ARTERIOVENOUS (AV) 4-47m x 45cm GORE-TEX GRAFT RIGHT FOREARM;  Surgeon: CAngelia Mould MD;  [place] [facility]  Service: Vascular;  Laterality: Right;  . AV FISTULA PLACEMENT Right 02/15/2015     Procedure: INSERTION OF RIGHT ARTERIOVENOUS (AV) GORE-TEX GRAFT ARM;  Surgeon: CAngelia Mould MD;  [place] [facility]  Service: Vascular;  Laterality: Right;  . CHOLECYSTECTOMY    . COLONOSCOPY    . CYSTOSCOPY W/ STONE MANIPULATION  1980's  . EYE SURGERY Bilateral 2016   CAtaract-   . HERNIA REPAIR     umbicical hernia  . IR GENERIC HISTORICAL  01/11/2016   IR [place] ACCESS RIGHT 01/11/2016 JCorrie Mckusick DO [facility]  . IR GENERIC HISTORICAL  01/11/2016   IR FLUORO GUIDE CV MIDLINE PICC RIGHT 01/11/2016 JCorrie Mckusick DO [facility]  . IR GENERIC HISTORICAL Right 01/13/2016   IR THROMBECTOMY AV FISTULA W/THROMBOLYSIS/PTA INC/SHUNT/IMG RIGHT 01/13/2016 AMarybelle Killings MD [facility]  . IR GENERIC HISTORICAL  01/13/2016   IR [place] V[place] ACCESS RIGHT 01/13/2016 AMarybelle Killings MD [facility]  . LUNG REMOVAL, PARTIAL Left 1987   "blood clot"  . THROMBECTOMY W/ EMBOLECTOMY Right 02/18/2015   Procedure: THROMBECTOMY RIGHT UPPER ARM ARTERIOVENOUS GORE-TEX GRAFT with revision of arterial anastamosis;  Surgeon: JMal Misty MD;  [place]  Service: Vascular;  Laterality: Right;  . TUBAL LIGATION  1964  . UMBILICAL HERNIA REPAIR  ~ 1983    Allergies  Allergen Reactions  . Penicillins Cross Reactors Other (See Comments)    Tongue  Swelling. Has patient had a PCN reaction causing immediate rash, facial/tongue/throat swelling, SOB or lightheadedness with hypotension: YES Has patient had a PCN reaction causing severe rash involving mucus membranes or skin necrosis: NO Has patient had a PCN reaction that required hospitalization NO Has patient had a PCN reaction occurring within the last 10 years: NO If all of the above answers are "NO", then may proceed with Cephalosporin use.    Prior to Admission medications   Medication Sig Start Date End Date Taking? Authorizing Provider  albuterol (PROVENTIL HFA;VENTOLIN HFA) 108 (90 BASE) MCG/ACT inhaler Inhale 1 puff into the lungs 2  (two) times daily as needed for wheezing or shortness of breath.   Yes Historical Provider, MD  allopurinol (ZYLOPRIM) 100 MG tablet Take 100 mg by mouth daily with breakfast.    Yes Historical Provider, MD  amLODipine (NORVASC) 5 MG tablet Take 1 tablet by mouth daily. 08/26/15  Yes Historical Provider, MD  cinacalcet (SENSIPAR) 30 MG tablet Take 30 mg by mouth daily.   Yes Historical Provider, MD  Cyanocobalamin (VITAMIN B-12 PO) Take 1 tablet by mouth daily.   Yes Historical Provider, MD  ergocalciferol (VITAMIN D2) 50000 UNITS capsule Take 50,000 Units by mouth once a week. Take on Thursdays   Yes Historical Provider, MD  gabapentin (NEURONTIN) 300 MG capsule Take 300 mg by mouth every 8 (eight) hours. 11/05/15  Yes Historical Provider, MD  glimepiride (AMARYL) 1 MG tablet Take 1 mg by mouth daily with breakfast.    Yes Historical Provider, MD  HYDROcodone-acetaminophen (NORCO) 10-325 MG tablet Take 1 tablet by mouth every 6 (six) hours as needed for moderate pain.   Yes Historical Provider, MD  hydrOXYzine (ATARAX/VISTARIL) 25 MG tablet Take 25 mg by mouth every 12 (twelve) hours as needed for itching.  09/07/14  Yes Historical Provider, MD  insulin aspart (NOVOLOG) 100 UNIT/ML injection Inject 5 Units into the skin daily as needed for high blood sugar (if blood sugar is over 200).   Yes Historical Provider, MD  LEVEMIR FLEXTOUCH 100 UNIT/ML Pen Inject 12 Units into the skin daily as needed (high blood sugar). If Blood sugar is above 110 pt will take 09/15/14  Yes Historical Provider, MD  levothyroxine (SYNTHROID, LEVOTHROID) 100 MCG tablet Take 100 mcg by mouth daily. 09/01/14  Yes Historical Provider, MD  multivitamin (RENA-VIT) TABS tablet Take 1 tablet by mouth daily.   Yes Historical Provider, MD  Oxycodone HCl 20 MG TABS Take 20 mg by mouth every 12 (twelve) hours.  06/07/13  Yes Historical Provider, MD  pantoprazole (PROTONIX) 40 MG tablet Take 40 mg by mouth 2 (two) times daily.    Yes Historical  Provider, MD  saxagliptin HCl (ONGLYZA) 2.5 MG TABS tablet Take 2.5 mg by mouth daily with breakfast.    Yes Historical Provider, MD  tiZANidine (ZANAFLEX) 4 MG tablet Take 4 mg by mouth 2 (two) times daily as needed for muscle spasms. Reported on 11/17/2015 01/11/15  Yes Historical Provider, MD  acyclovir (ZOVIRAX) 400 MG tablet TAKE 1 TABLET(400 MG) BY MOUTH DAILY Patient not taking: Reported on 11/17/2015 03/25/15   Curt Bears, MD  B-D ULTRAFINE III SHORT PEN 31G X 8 MM MISC  07/19/14   Historical Provider, MD  dexamethasone (DECADRON) 4 MG tablet TAKE 10 TABLETS(40 MG) BY MOUTH 1 TIME A WEEK Patient not taking: Reported on 06/13/2015 03/25/15   Curt Bears, MD  lidocaine-prilocaine (EMLA) cream Apply 1 application topically daily as needed (for numbing).  11/06/13  Historical Provider, MD  ondansetron (ZOFRAN) 8 MG tablet Take 1 tablet (8 mg total) by mouth every 8 (eight) hours as needed for nausea or vomiting. Patient not taking: Reported on 04/07/2015 08/05/13   Curt Bears, MD  ONE TOUCH ULTRA TEST test strip  12/05/13   Historical Provider, MD    Social History   Social History  . Marital status: Widowed    Spouse name: N/A  . Number of children: N/A  . Years of education: N/A   Occupational History  . Not on file.   Social History Main Topics  . Smoking status: Never Smoker  . Smokeless tobacco: Never Used  . Alcohol use No  . Drug use: No  . Sexual activity: No   Other Topics Concern  . Not on file   Social History Narrative  . No narrative on file     Family History  Problem Relation Age of Onset  . Blindness Father   . Diabetes Father   . Diabetes Sister   . Kidney failure Brother     ROS: '[x]'$  Positive   '[ ]'$  Negative   '[ ]'$  All sytems reviewed and are negative  Cardiovascular: '[]'$  chest pain/pressure '[]'$  palpitations '[x]'$  SOB lying flat '[]'$  DOE '[]'$  pain in legs while walking '[]'$  pain in legs at rest '[]'$  pain in legs at night '[]'$  non-healing ulcers '[]'$  hx  of DVT '[]'$  swelling in legs  Pulmonary: '[]'$  productive cough '[x]'$  asthma/wheezing '[]'$  home O2  Neurologic: '[]'$  weakness in '[]'$  arms '[]'$  legs '[]'$  numbness in '[]'$  arms '[]'$  legs '[]'$  hx of CVA '[]'$  mini stroke '[]'$ difficulty speaking or slurred speech '[]'$  temporary loss of vision in one eye '[]'$  dizziness  Hematologic: '[]'$  hx of cancer '[]'$  bleeding problems '[]'$  problems with blood clotting easily  Endocrine:   '[x]'$  diabetes '[]'$  thyroid disease  GI '[]'$  vomiting blood '[]'$  blood in stool  GU: '[]'$  CKD/renal failure '[]'$  HD--'[]'$  M/W/F or '[]'$  T/T/S '[]'$  burning with urination '[]'$  blood in urine  Psychiatric: '[]'$  anxiety '[]'$  depression  Musculoskeletal: '[]'$  arthritis '[]'$  joint pain  Integumentary: '[]'$  rashes '[]'$  ulcers  Constitutional: '[]'$  fever '[]'$  chills   Physical Examination  Vitals:   01/14/16 0900 01/14/16 0935  BP: 127/60 128/61  Pulse: 68 67  Resp: 18 18  Temp:  97.6 F (36.4 C)   Body mass index is 32.8 kg/m.  General:  WDWN in NAD Gait: Not observed HENT: WNL, normocephalic Pulmonary: normal non-labored breathing Cardiac: regular Abdomen:  soft, NT/ND Skin: without rashes Vascular Exam/Pulses: Palpable R brachial and radial pulses Extremities: RUE av graft palpable without thrill Musculoskeletal: no muscle wasting or atrophy  Neurologic: A&O X 3; Appropriate Affect ; SENSATION: normal; MOTOR FUNCTION:  moving all extremities equally. Speech is fluent/normal   CBC    Component Value Date/Time   WBC 9.2 01/14/2016 0448   RBC 3.09 (L) 01/14/2016 0448   HGB 9.1 (L) 01/14/2016 0448   HGB 11.3 (L) 11/10/2015 0955   HCT 28.2 (L) 01/14/2016 0448   HCT 34.8 11/10/2015 0955   PLT 297 01/14/2016 0448   PLT 201 11/10/2015 0955   MCV 91.3 01/14/2016 0448   MCV 92.3 11/10/2015 0955   MCH 29.4 01/14/2016 0448   MCHC 32.3 01/14/2016 0448   RDW 16.4 (H) 01/14/2016 0448   RDW 18.0 (H) 11/10/2015 0955   LYMPHSABS 1.4 01/14/2016 0448   LYMPHSABS 1.0 11/10/2015 0955   MONOABS 1.4 (H)  01/14/2016 0448   MONOABS 0.6 11/10/2015 1610  EOSABS 0.2 01/14/2016 0448   EOSABS 0.1 11/10/2015 0955   BASOSABS 0.0 01/14/2016 0448   BASOSABS 0.1 11/10/2015 0955    BMET    Component Value Date/Time   NA 134 (L) 01/14/2016 0448   NA 136 11/10/2015 0955   K 4.0 01/14/2016 0448   K 3.8 11/10/2015 0955   CL 95 (L) 01/14/2016 0448   CL 98 09/17/2012 1242   CO2 26 01/14/2016 0448   CO2 27 11/10/2015 0955   GLUCOSE 84 01/14/2016 0448   GLUCOSE 236 (H) 11/10/2015 0955   GLUCOSE 159 (H) 09/17/2012 1242   BUN 35 (H) 01/14/2016 0448   BUN 33.0 (H) 11/10/2015 0955   CREATININE 6.34 (H) 01/14/2016 0448   CREATININE 6.6 (HH) 11/10/2015 0955   CALCIUM 8.3 (L) 01/14/2016 0448   CALCIUM 8.5 11/10/2015 0955   GFRNONAA 6 (L) 01/14/2016 0448   GFRAA 6 (L) 01/14/2016 0448    COAGS: Lab Results  Component Value Date   INR 1.38 01/11/2016   INR 1.02 06/08/2013   INR 1.02 03/06/2011      ASSESSMENT/PLAN: This is a 82 y.o. female with ESRD, history of RUE av loop graft now thrombosed s/p attempted percutaneous thrombectomy.  -OR tomorrow for open thrombectomy with possible endovascular intervention and placement of tunneled dialysis catheter -npo after midnight -already on abx for diverticulitis -discussed with nephrology  Hilberto Burzynski C. Donzetta Matters, MD [facility] Office: [phone] Pager: [phone]

## 2016-01-14 NOTE — Progress Notes (Signed)
Subjective:   AVG clotted again. Pt no c/o's, on HD  Objective Vital signs in last 24 hours: Vitals:   01/14/16 0730 01/14/16 0800 01/14/16 0830 01/14/16 0900  BP: 121/61 135/63 (!) 136/59 127/60  Pulse: 64 62 62 68  Resp: 18 18 18 18   Temp:      TempSrc:      SpO2:      Weight:      Height:       Weight change: -0.3 kg (-10.6 oz)  Physical Exam: General: obese, awake, NAD, soft spoken but appropriate  Heart: RRR no rub Lungs:  CTA bilat. non labored breathing  Abdomen: soft BS Pos. Minamally tender epigastric area , nondistended  Extremities: trace bipedal  edema Dialysis Access:  RUA AVGG no bruit or thrill     Dialysis: MWF AF    4h  91.5kg  2/2.25 bath  Hep 6000  RUA AVGG Hec 96mcg IV/HD aranesp 60 q week /HD Venofer 50mg  q wkkly Hd  Other op labs HGB  10.9/  pth 409  Recent access hx: 12/14 - new L upper AVG 5/16 -   new R forearm AVG 9/16 -   new R upper arm AVG (current access) 9/16 - declot AVG , VVS 1/17 - declot AVG, [facility] access ctr 7/17 - shuntogram for low access flows, PTA, [facility] access ctr    Impression:   1  Clotted RUA AVGG - declotted yest, now is clotted again.  Have asked VVS to evaluate.  HD today w temp cath.  2  Acute diverticulitis w abscess - on IV antibiotics, WBC down and doing much better 3  Debility - walking at home , unable to walk here. PT recommends SNF but pt wants to go home. 4  ESRD - MWF hd. Off sched.   5  Volume - under dry wt 1-2kg 6  Anemia of ESRD - hg 10.9 > 9.6  >9.2> 10.2 continue weekly ESa and fe  7  MBD cont sensipar and binder tums ex 2 ac when eating  8  Hx myeloma in remission 9  Chronic back pain 10  IDDM= on insulin 11  Left adrenal mass /Splenic lesion= on CT Admit team wu  12  Nutrition -  (Carb mod /renal diet when eating Renal vit ) when eating     Plan- HD today, then resume MWF.  VVS consult for access   Kelly Splinter MD [place] pager [phone]    cell  [phone] 01/14/2016, 9:53 AM     Labs: Basic Metabolic Panel:  Recent Labs Lab 01/12/16 0712 01/13/16 0544 01/14/16 0448  NA 133* 135 134*  K 5.1 4.0 4.0  CL 93* 94* 95*  CO2 25 28 26   GLUCOSE 150* 105* 84  BUN 53* 24* 35*  CREATININE 7.24* 4.45* 6.34*  CALCIUM 8.1* 8.5* 8.3*  PHOS 7.0* 5.2* 5.8*   Liver Function Tests:  Recent Labs Lab 01/09/16 0715 01/12/16 0712 01/14/16 0448  ALBUMIN 2.3* 2.2* 2.4*   No results for input(s): LIPASE, AMYLASE in the last 168 hours. No results for input(s): AMMONIA in the last 168 hours. CBC:  Recent Labs Lab 01/09/16 0715  01/11/16 1310 01/12/16 0712 01/13/16 0544 01/14/16 0448  WBC 12.2*  --  12.2* 11.7* 10.9* 9.2  NEUTROABS  --   < > 9.1* 9.4* 7.4 6.2  HGB 10.2*  --  10.4* 9.2* 9.9* 9.1*  HCT 30.6*  --  31.2* 28.0* 29.9* 28.2*  MCV 91.9  --  92.3 91.2 91.2 91.3  PLT 307  --  346 245 297 297  < > = values in this interval not displayed. Cardiac Enzymes: No results for input(s): CKTOTAL, CKMB, CKMBINDEX, TROPONINI in the last 168 hours. CBG:  Recent Labs Lab 01/12/16 2219 01/13/16 0735 01/13/16 1102 01/13/16 1702 01/13/16 2150  GLUCAP 147* 122* 88 147* 124*    Medications:   . calcium carbonate  2 tablet Oral TID WC  . cinacalcet  30 mg Oral Q breakfast  . ciprofloxacin  500 mg Oral Q24H  . darbepoetin (ARANESP) injection - DIALYSIS  100 mcg Intravenous Q Wed-HD  . doxercalciferol      . doxercalciferol  4 mcg Intravenous Q M,W,F-HD  . [START ON 2016/01/26] heparin  3,000 Units Dialysis Once in dialysis  . heparin  5,000 Units Subcutaneous Q8H  . insulin aspart  0-9 Units Subcutaneous TID WC  . insulin detemir  5 Units Subcutaneous Q2200  . levothyroxine  100 mcg Oral QAC breakfast  . metroNIDAZOLE  500 mg Oral Q8H  . oxyCODONE  10 mg Oral Q12H  . sodium chloride flush  10-40 mL Intracatheter Q12H

## 2016-01-15 ENCOUNTER — Inpatient Hospital Stay (HOSPITAL_COMMUNITY): Payer: Medicare Other | Admitting: Certified Registered"

## 2016-01-15 ENCOUNTER — Encounter (HOSPITAL_COMMUNITY): Admission: EM | Disposition: E | Payer: Self-pay | Source: Home / Self Care | Attending: Internal Medicine

## 2016-01-15 ENCOUNTER — Inpatient Hospital Stay (HOSPITAL_COMMUNITY): Payer: Medicare Other

## 2016-01-15 DIAGNOSIS — I469 Cardiac arrest, cause unspecified: Secondary | ICD-10-CM

## 2016-01-15 HISTORY — PX: PERICARDIAL WINDOW: SHX2213

## 2016-01-15 HISTORY — PX: INSERTION OF DIALYSIS CATHETER: SHX1324

## 2016-01-15 LAB — BASIC METABOLIC PANEL
Anion gap: 12 (ref 5–15)
BUN: 16 mg/dL (ref 6–20)
CALCIUM: 8.5 mg/dL — AB (ref 8.9–10.3)
CHLORIDE: 97 mmol/L — AB (ref 101–111)
CO2: 25 mmol/L (ref 22–32)
CREATININE: 4.93 mg/dL — AB (ref 0.44–1.00)
GFR calc non Af Amer: 7 mL/min — ABNORMAL LOW (ref >60)
GFR, EST AFRICAN AMERICAN: 9 mL/min — AB (ref >60)
Glucose, Bld: 121 mg/dL — ABNORMAL HIGH (ref 65–99)
Potassium: 3.9 mmol/L (ref 3.5–5.1)
SODIUM: 134 mmol/L — AB (ref 135–145)

## 2016-01-15 LAB — PROTIME-INR
INR: 1.26
Prothrombin Time: 15.9 seconds — ABNORMAL HIGH (ref 11.4–15.2)

## 2016-01-15 LAB — CBC WITH DIFFERENTIAL/PLATELET
Basophils Absolute: 0 10*3/uL (ref 0.0–0.1)
Basophils Relative: 0 %
EOS ABS: 0.2 10*3/uL (ref 0.0–0.7)
EOS PCT: 2 %
HCT: 29.4 % — ABNORMAL LOW (ref 36.0–46.0)
Hemoglobin: 9.3 g/dL — ABNORMAL LOW (ref 12.0–15.0)
LYMPHS ABS: 1.7 10*3/uL (ref 0.7–4.0)
Lymphocytes Relative: 17 %
MCH: 29.2 pg (ref 26.0–34.0)
MCHC: 31.6 g/dL (ref 30.0–36.0)
MCV: 92.5 fL (ref 78.0–100.0)
MONO ABS: 1 10*3/uL (ref 0.1–1.0)
MONOS PCT: 10 %
Neutro Abs: 7.1 10*3/uL (ref 1.7–7.7)
Neutrophils Relative %: 71 %
Platelets: 306 10*3/uL (ref 150–400)
RBC: 3.18 MIL/uL — ABNORMAL LOW (ref 3.87–5.11)
RDW: 16.6 % — AB (ref 11.5–15.5)
WBC: 10 10*3/uL (ref 4.0–10.5)

## 2016-01-15 LAB — MAGNESIUM: Magnesium: 2.1 mg/dL (ref 1.7–2.4)

## 2016-01-15 LAB — PHOSPHORUS: Phosphorus: 4.2 mg/dL (ref 2.5–4.6)

## 2016-01-15 LAB — GLUCOSE, CAPILLARY: GLUCOSE-CAPILLARY: 117 mg/dL — AB (ref 65–99)

## 2016-01-15 SURGERY — INSERTION OF DIALYSIS CATHETER
Anesthesia: General | Site: Neck

## 2016-01-15 MED ORDER — EPINEPHRINE HCL 0.1 MG/ML IJ SOSY
PREFILLED_SYRINGE | INTRAMUSCULAR | Status: DC | PRN
  Administered 2016-01-15 (×6): 1 mg via INTRAVENOUS

## 2016-01-15 MED ORDER — SODIUM CHLORIDE 0.9 % IV SOLN
INTRAVENOUS | Status: DC | PRN
  Administered 2016-01-15: 10:00:00

## 2016-01-15 MED ORDER — IOPAMIDOL (ISOVUE-300) INJECTION 61%
INTRAVENOUS | Status: DC | PRN
  Administered 2016-01-15: 35 mL via INTRAVENOUS

## 2016-01-15 MED ORDER — LIDOCAINE-EPINEPHRINE (PF) 1 %-1:200000 IJ SOLN
INTRAMUSCULAR | Status: AC
  Filled 2016-01-15: qty 30

## 2016-01-15 MED ORDER — IOPAMIDOL (ISOVUE-300) INJECTION 61%
INTRAVENOUS | Status: AC
  Filled 2016-01-15: qty 50

## 2016-01-15 MED ORDER — PHENYLEPHRINE HCL 10 MG/ML IJ SOLN
INTRAVENOUS | Status: DC | PRN
  Administered 2016-01-15: 30 ug/min via INTRAVENOUS

## 2016-01-15 MED ORDER — ATROPINE SULFATE 1 MG/ML IJ SOLN
INTRAMUSCULAR | Status: DC | PRN
  Administered 2016-01-15: 1 mg via INTRAVENOUS

## 2016-01-15 MED ORDER — PROPOFOL 1000 MG/100ML IV EMUL
INTRAVENOUS | Status: AC
  Filled 2016-01-15: qty 100

## 2016-01-15 MED ORDER — PROPOFOL 10 MG/ML IV BOLUS
INTRAVENOUS | Status: AC
  Filled 2016-01-15: qty 20

## 2016-01-15 MED ORDER — LIDOCAINE HCL (PF) 1 % IJ SOLN
INTRAMUSCULAR | Status: AC
  Filled 2016-01-15: qty 30

## 2016-01-15 MED ORDER — EPHEDRINE SULFATE 50 MG/ML IJ SOLN
INTRAMUSCULAR | Status: DC | PRN
  Administered 2016-01-15: 10 mg via INTRAVENOUS

## 2016-01-15 MED ORDER — PROPOFOL 500 MG/50ML IV EMUL
INTRAVENOUS | Status: DC | PRN
  Administered 2016-01-15: 50 ug/kg/min via INTRAVENOUS

## 2016-01-15 MED ORDER — EPHEDRINE SULFATE 50 MG/ML IJ SOLN
INTRAMUSCULAR | Status: AC
  Filled 2016-01-15: qty 1

## 2016-01-15 MED ORDER — HEPARIN SODIUM (PORCINE) 1000 UNIT/ML IJ SOLN
INTRAMUSCULAR | Status: AC
  Filled 2016-01-15: qty 1

## 2016-01-15 MED ORDER — EPINEPHRINE HCL 0.1 MG/ML IJ SOSY: PREFILLED_SYRINGE | INTRAMUSCULAR | Status: DC | PRN

## 2016-01-15 MED ORDER — FENTANYL CITRATE (PF) 100 MCG/2ML IJ SOLN
INTRAMUSCULAR | Status: DC | PRN
  Administered 2016-01-15 (×3): 50 ug via INTRAVENOUS

## 2016-01-15 MED ORDER — LIDOCAINE-EPINEPHRINE (PF) 1 %-1:200000 IJ SOLN
INTRAMUSCULAR | Status: DC | PRN
  Administered 2016-01-15: 10 mL

## 2016-01-15 MED ORDER — PHENYLEPHRINE 40 MCG/ML (10ML) SYRINGE FOR IV PUSH (FOR BLOOD PRESSURE SUPPORT)
PREFILLED_SYRINGE | INTRAVENOUS | Status: AC
  Filled 2016-01-15: qty 10

## 2016-01-15 MED ORDER — FENTANYL CITRATE (PF) 250 MCG/5ML IJ SOLN
INTRAMUSCULAR | Status: AC
  Filled 2016-01-15: qty 5

## 2016-01-15 MED ORDER — SODIUM BICARBONATE 8.4 % IV SOLN
INTRAVENOUS | Status: DC | PRN
  Administered 2016-01-15 (×2): 50 meq via INTRAVENOUS

## 2016-01-15 MED ORDER — CALCIUM CHLORIDE 10 % IV SOLN: INTRAVENOUS | Status: DC | PRN

## 2016-01-15 MED ORDER — LIDOCAINE 2% (20 MG/ML) 5 ML SYRINGE
INTRAMUSCULAR | Status: AC
  Filled 2016-01-15: qty 5

## 2016-01-15 MED ORDER — PROPOFOL 10 MG/ML IV BOLUS
INTRAVENOUS | Status: DC | PRN
  Administered 2016-01-15: 30 mg via INTRAVENOUS

## 2016-01-15 MED ORDER — 0.9 % SODIUM CHLORIDE (POUR BTL) OPTIME
TOPICAL | Status: DC | PRN
  Administered 2016-01-15: 1000 mL

## 2016-01-15 MED ORDER — CALCIUM GLUCONATE 10 % IV SOLN
INTRAVENOUS | Status: DC | PRN
  Administered 2016-01-15: 1 g via INTRAVENOUS

## 2016-01-15 MED ORDER — SODIUM CHLORIDE 0.9 % IJ SOLN
INTRAMUSCULAR | Status: AC
  Filled 2016-01-15: qty 10

## 2016-01-15 SURGICAL SUPPLY — 43 items
BLADE STERNUM SYSTEM 6 (BLADE) ×3 IMPLANT
CATH MALECOT BARD  28FR (CATHETERS) ×6 IMPLANT
CATH PALINDROME RT-P 15FX23CM (CATHETERS) ×3 IMPLANT
CATH THORACIC 28FR (CATHETERS) ×6 IMPLANT
CATH TROCAR 32FR (CATHETERS) ×3 IMPLANT
CONN 1/2X1/2X1/2  BEN (MISCELLANEOUS) ×2
CONN 1/2X1/2X1/2 BEN (MISCELLANEOUS) ×1 IMPLANT
CONN ST 1/4X3/8  BEN (MISCELLANEOUS) ×4
CONN ST 1/4X3/8 BEN (MISCELLANEOUS) ×2 IMPLANT
CONN Y 3/8X3/8X3/8  BEN (MISCELLANEOUS) ×2
CONN Y 3/8X3/8X3/8 BEN (MISCELLANEOUS) ×1 IMPLANT
DEVICE TORQUE KENDALL .025-038 (MISCELLANEOUS) ×6 IMPLANT
DRAPE C-ARM 42X72 X-RAY (DRAPES) ×3 IMPLANT
EVACUATOR SILICONE 100CC (DRAIN) ×3 IMPLANT
GLOVE BIO SURGEON STRL SZ7 (GLOVE) ×6 IMPLANT
GLOVE BIO SURGEON STRL SZ7.5 (GLOVE) ×6 IMPLANT
GLOVE BIOGEL PI IND STRL 6.5 (GLOVE) ×3 IMPLANT
GLOVE BIOGEL PI IND STRL 7.5 (GLOVE) ×2 IMPLANT
GLOVE BIOGEL PI INDICATOR 6.5 (GLOVE) ×6
GLOVE BIOGEL PI INDICATOR 7.5 (GLOVE) ×4
GOWN STRL REUS W/ TWL LRG LVL3 (GOWN DISPOSABLE) ×4 IMPLANT
GOWN STRL REUS W/TWL LRG LVL3 (GOWN DISPOSABLE) ×20
GUIDEWIRE ANGLED .035X150CM (WIRE) ×3 IMPLANT
KIT BASIN OR (CUSTOM PROCEDURE TRAY) ×3 IMPLANT
KIT SUCTION CATH 14FR (SUCTIONS) ×3 IMPLANT
PACK CV ACCESS (CUSTOM PROCEDURE TRAY) ×3 IMPLANT
PAD DEFIB PEDI PADZ (MISCELLANEOUS) ×3 IMPLANT
SET MICROPUNCTURE 5F STIFF (MISCELLANEOUS) ×3 IMPLANT
SPONGE LAP 18X18 X RAY DECT (DISPOSABLE) ×3 IMPLANT
STAPLER VISISTAT 35W (STAPLE) ×3 IMPLANT
STOPCOCK 4 WAY LG BORE MALE ST (IV SETS) ×6 IMPLANT
SUT ETHILON 3 0 PS 1 (SUTURE) ×3 IMPLANT
SUT SILK  1 MH (SUTURE) ×6
SUT SILK 1 MH (SUTURE) ×3 IMPLANT
SUT SILK 2 0 FS (SUTURE) ×6 IMPLANT
SUT VIC AB 1 CTX 18 (SUTURE) ×3 IMPLANT
SUT VIC AB 2-0 CT1 18 (SUTURE) ×3 IMPLANT
SYSTEM SAHARA CHEST DRAIN RE-I (WOUND CARE) ×6 IMPLANT
TRAY CATH LUMEN 1 20CM STRL (SET/KITS/TRAYS/PACK) ×3 IMPLANT
TUBE CONNECTING 12'X1/4 (SUCTIONS) ×2
TUBE CONNECTING 12X1/4 (SUCTIONS) ×4 IMPLANT
TUBING ART PRESS 48 MALE/FEM (TUBING) ×6 IMPLANT
WIRE AMPLATZ SS-J .035X180CM (WIRE) ×3 IMPLANT

## 2016-01-16 ENCOUNTER — Encounter (HOSPITAL_COMMUNITY): Payer: Self-pay | Admitting: Vascular Surgery

## 2016-01-16 LAB — TYPE AND SCREEN
ABO/RH(D): B POS
ANTIBODY SCREEN: POSITIVE
DAT, IGG: NEGATIVE
DONOR AG TYPE: NEGATIVE
DONOR AG TYPE: NEGATIVE
DONOR AG TYPE: NEGATIVE
Donor AG Type: NEGATIVE
Donor AG Type: NEGATIVE
Donor AG Type: NEGATIVE
Donor AG Type: NEGATIVE
UNIT DIVISION: 0
UNIT DIVISION: 0
UNIT DIVISION: 0
Unit division: 0
Unit division: 0
Unit division: 0
Unit division: 0

## 2016-02-03 NOTE — Anesthesia Postprocedure Evaluation (Signed)
Anesthesia Post Note  Patient: [name]  Procedure(s) Performed: Procedure(s) (LRB): Failed INSERTION OF DIALYSIS CATHETER. (Left) PERICARDIAL WINDOW (N/A)  Anesthetic complications: no Comments: Pt dropped her blood pressure secondary to procedural issue. Immediately intubated, no pulse detected. CPR begun with epinephrine, bicard, calcium, atropine and volume resuscitation. Despite Bilateral chest tubes, pericardial decompression and multiple defibrilations resucitation was unsucessful.    Last Vitals:  Vitals:   01/14/16 2057 February 01, 2016 0522  BP: (!) 123/51 (!) 130/57  Pulse: 70 70  Resp: 17 18  Temp: 36.8 C 36.8 C    Last Pain:  Vitals:   2016/02/01 0522  TempSrc: Oral  PainSc:                  [name] DAVID

## 2016-02-03 NOTE — Progress Notes (Signed)
  Progress Note    February 09, 2016 7:11 AM Day of Surgery  Subjective:  No issues overnigh  Vitals:   01/14/16 2057 February 09, 2016 0522  BP: (!) 123/51 (!) 130/57  Pulse: 70 70  Resp: 17 18  Temp: 98.2 F (36.8 C) 98.2 F (36.8 C)    Physical Exam: RRR Non labored breathing RUE av graft without thrill or bruit Palpable R radial pulse, R hand is warm with 5/5 strength  CBC    Component Value Date/Time   WBC 10.0 Feb 09, 2016 0500   RBC 3.18 (L) 2016-02-09 0500   HGB 9.3 (L) 09-Feb-2016 0500   HGB 11.3 (L) 11/10/2015 0955   HCT 29.4 (L) 02-09-2016 0500   HCT 34.8 11/10/2015 0955   PLT 306 02/09/16 0500   PLT 201 11/10/2015 0955   MCV 92.5 02/09/16 0500   MCV 92.3 11/10/2015 0955   MCH 29.2 2016/02/09 0500   MCHC 31.6 09-Feb-2016 0500   RDW 16.6 (H) 02/09/2016 0500   RDW 18.0 (H) 11/10/2015 0955   LYMPHSABS 1.7 02-09-16 0500   LYMPHSABS 1.0 11/10/2015 0955   MONOABS 1.0 2016/02/09 0500   MONOABS 0.6 11/10/2015 0955   EOSABS 0.2 09-Feb-2016 0500   EOSABS 0.1 11/10/2015 0955   BASOSABS 0.0 02-09-2016 0500   BASOSABS 0.1 11/10/2015 0955    BMET    Component Value Date/Time   NA 134 (L) February 09, 2016 0500   NA 136 11/10/2015 0955   K 3.9 02-09-16 0500   K 3.8 11/10/2015 0955   CL 97 (L) 02-09-16 0500   CL 98 09/17/2012 1242   CO2 25 09-Feb-2016 0500   CO2 27 11/10/2015 0955   GLUCOSE 121 (H) 02-09-2016 0500   GLUCOSE 236 (H) 11/10/2015 0955   GLUCOSE 159 (H) 09/17/2012 1242   BUN 16 2016/02/09 0500   BUN 33.0 (H) 11/10/2015 0955   CREATININE 4.93 (H) 02/09/2016 0500   CREATININE 6.6 (HH) 11/10/2015 0955   CALCIUM 8.5 (L) 02/09/2016 0500   CALCIUM 8.5 11/10/2015 0955   GFRNONAA 7 (L) 02-09-2016 0500   GFRAA 9 (L) 2016-02-09 0500    INR    Component Value Date/Time   INR 1.26 Feb 09, 2016 0500     Intake/Output Summary (Last 24 hours) at 02-09-16 0711 Last data filed at 01/14/16 1500  Gross per 24 hour  Intake              720 ml  Output                 0 ml  Net              720 ml     Assessment:  82 y.o. female is s/p with clotted av graft rue and temporary hd catheter R IJ   Plan: OR today for R arm graft thrombectomy with placement tdc    Brandon C. Donzetta Matters, MD [facility] Office: [phone] Pager: [phone]  Feb 09, 2016 7:11 AM

## 2016-02-03 NOTE — Op Note (Signed)
[name], KEEN NO.:  [id number]  MEDICAL RECORD NO.:  [id number]  LOCATION:  [facility]                         FACILITY:  [facility]  PHYSICIAN:  Lanelle Bal, MD    DATE OF BIRTH:  1932/11/15  DATE OF PROCEDURE:  01/17/2016 DATE OF DISCHARGE:                              OPERATIVE REPORT   PREOPERATIVE DIAGNOSIS:  Cardiac arrest in the operating room.  POSTOPERATIVE DIAGNOSIS:  Cardiac arrest in the operating room.  SURGICAL PROCEDURE:  Subxiphoid pericardial window and placement of bilateral chest tubes, ongoing CPR.  SURGEON:  Lanelle Bal, M.D. Assistant Dr Corinna Gab BRIEF HISTORY:  The patient is an 82 year old female, who has been in the hospital since January 03, 2016, with chronic renal failure on dialysis and also chronic renal failure.  I was called stat to the operating room to assist Dr. Donzetta Matters  who had been attempting to place a left subclavian vein catheter under MAC anesthesia.  The patient became profoundly hypotensive, arrested, CPR was in progress.  Dr. Conrad Hayden intubated the patient.  There was concern for pericardial tamponade and/or injury to the innominate or subclavian vein.  On arrival, the patient was in full arrest.  DESCRIPTION OF PROCEDURE:  As noted above, the patient was in full arrest.  The CPR was in progress.  The patient was been ventilated. Because of the most immediate concern of possible pericardial tamponade, a small hand-held SonoSite device was used to look at the pericardium. There was a small amount of fluid evident.  The patient had a rhythm but no palpable pressure without CPR, and immediately continued with CPR, prepped the chest, made a small incision over the subxiphoid process and with retraction identified the pericardium which was opened.  A small amount of fluid was removed.  As we were doing this, Dr. Conrad [place] also placed a TEE probe in the esophagus to examine the heart.  There did not appear to be any loculated  areas within the pericardium.  With the drain in the pericardium, we continued with resuscitative efforts with anesthesia managing medications including epinephrine and calcium.  The patient fibrillated several times and required cardiac defibrillation. We then placed bilateral chest tubes into the right chest first and then into the left subxiphoid window.  Because of the history, as Dr. Dewaine Conger was placing the guidewire and dilator into the superior vena cava, some contrast was given and appeared to reflux into the pericardium.  With placement of the chest tube to the right on the right chest, approximately 600 mL of dark blood was removed and then minimal blood was evacuated from the left chest.  The patient during this period of time had significant ST changes.  When she did have a rhythm and as noted required defibrillation several times.  At this point, we continued resuscitative efforts as Dr. Dewaine Conger left to discuss situation with the patient's daughter.  Prior to the procedure, he had a discussion with the patient and the daughter about code status.  After this discussion with the daughter, we decided to in further resuscitative efforts.  In spite ensuring drainage of the pericardium in both chests, we were unable to maintain a blood pressure or rhythm and  resuscitative efforts were stopped.  Dr. Dewaine Conger notified the medical examiner and the patient's family.     Lanelle Bal, MD     EG/MEDQ  D:  02/10/16  T:  02-10-2016  Job:  [id number]

## 2016-02-03 NOTE — Transfer of Care (Signed)
Immediate Anesthesia Transfer of Care Note  Patient: [name]  Procedure(s) Performed: Procedure(s): ARM GRAFT THROMBECTOMY AND TUNNEL CATH REPLACEMENT (N/A) INSERTION OF DIALYSIS CATHETER (N/A)  Patient Location: pt death in OR  Anesthesia Type:General  Pt death in OR  Last Vitals:  Vitals:   01/14/16 2057 19-Jan-2016 0522  BP: (!) 123/51 (!) 130/57  Pulse: 70 70  Resp: 17 18  Temp: 36.8 C 36.8 C    Last Pain:  Vitals:   01-19-2016 0522  TempSrc: Oral  PainSc:       Patients Stated Pain Goal: 0 ([id number])  Complications: death

## 2016-02-03 NOTE — Anesthesia Procedure Notes (Signed)
Procedure Name: MAC Date/Time: 02/09/16 7:41 AM Performed by: Lance Coon Pre-anesthesia Checklist: Patient identified, Emergency Drugs available, Suction available, Patient being monitored and Timeout performed Patient Re-evaluated:Patient Re-evaluated prior to inductionOxygen Delivery Method: Nasal cannula Preoxygenation: Pre-oxygenation with 100% oxygen Intubation Type: IV induction

## 2016-02-03 NOTE — Anesthesia Preprocedure Evaluation (Signed)
Anesthesia Evaluation  Patient identified by MRN, date of birth, ID band Patient awake    Reviewed: Allergy & Precautions, NPO status , Patient's Chart, lab work & pertinent test results  Airway Mallampati: I  TM Distance: >3 FB Neck ROM: Full    Dental   Pulmonary COPD,    Pulmonary exam normal        Cardiovascular hypertension, Pt. on medications Normal cardiovascular exam     Neuro/Psych    GI/Hepatic GERD  Medicated and Controlled,  Endo/Other  diabetes  Renal/GU ESRF and DialysisRenal disease     Musculoskeletal   Abdominal   Peds  Hematology   Anesthesia Other Findings   Reproductive/Obstetrics                             Anesthesia Physical Anesthesia Plan  ASA: III  Anesthesia Plan: MAC   Post-op Pain Management:    Induction: Intravenous  Airway Management Planned: Simple Face Mask  Additional Equipment:   Intra-op Plan:   Post-operative Plan:   Informed Consent: I have reviewed the patients History and Physical, chart, labs and discussed the procedure including the risks, benefits and alternatives for the proposed anesthesia with the patient or authorized representative who has indicated his/her understanding and acceptance.     Plan Discussed with: CRNA and Surgeon  Anesthesia Plan Comments:         Anesthesia Quick Evaluation

## 2016-02-03 NOTE — Discharge Summary (Signed)
Death Summary  [name] VVO:[id number] DOB: Sep 23, 1932 DOA: January 28, 2016  PCP: [name], MD  Admit date: 01/28/2016 Date of Death: 02-09-16 Time of Death: 1228-09-03 Notification: [name], MD notified of death of 09-Feb-2016   History of present illness:  [name] is a 82 y.o. female with a history of [name] is a 82 y.o. female with medical history significant for chronic kidney disease on dialysis, diabetes on insulin, history of PE, chronic low back pain, diabetic neuropathy, dyslipidemia, hypertension and hypothyroidism who presents to the hospital with reports of progressively worsening lower abdominal pain for one week. She has not had any fevers or chills, she denies nausea or vomiting, she did notice increased pain with any type of activity.   Hospital Course: Patients admitted with diverticulitis with the evolving abscess.Refused surgical intervention.Rapidly improved with IV Flagyl and Cipro. SIRS & AMS resolved, patient switched to oral medication and preparation for discharge. Patient had be receiving hemodialysis as inpatient.It was found that her graft had clotted. Patient was taken to the operating room for right arm graft thrombectomy. Patient had a rapid drop in blood pressure when left IJ access was being placed. ACLS was started. Chest tube was placed with 700 mL of blood out. Patient require multiple shocks for a ventricular fibrillation. Through a pericardial window little heart movement seen. Dr. Donzetta Matters spoke to the family. ACLS stopped. Tubes removed and pt moved to PACU.  Final Diagnoses:  1.   Cardiac Arrest   The results of significant diagnostics from this hospitalization (including imaging, microbiology, ancillary and laboratory) are listed below for reference.    Significant Diagnostic Studies: Dg Chest 2 View  Result Date: 01-28-2016 CLINICAL DATA:  Cough and fever tonight. EXAM: CHEST  2 VIEW COMPARISON:  01/23/2015  FINDINGS: Mild cardiomegaly. There is atherosclerosis of the thoracic aorta, unchanged aortic and mediastinal contours. Stable pleural-parenchymal scarring at the left lung base. New linear opacity in the periphery of the right midlung zone. Chronic bronchial thickening is unchanged. No pleural effusion or pneumothorax. A vascular stent in the left axilla. There is degenerative change throughout spine. IMPRESSION: 1. New linear opacity in the right midlung zone likely subsegmental atelectasis or scarring. Stable pleural parenchymal scarring at the left lung base. 2. Aortic atherosclerosis. Electronically Signed   By: Jeb Levering M.D.   On: 2016/01/28 02:51   Ct Head Wo Contrast  Result Date: 01/09/2016 CLINICAL DATA:  Patient became unresponsive at 9 a.m. this morning during dialysis. EXAM: CT HEAD WITHOUT CONTRAST TECHNIQUE: Contiguous axial images were obtained from the base of the skull through the vertex without intravenous contrast. COMPARISON:  Head CT scan 01/04/2016.  Brain MRI 01/05/2016. FINDINGS: There is no evidence of acute intracranial abnormality including hemorrhage, infarct, mass, mass effect, midline shift or abnormal extra-axial fluid collection. No hydrocephalus or pneumocephalus. The calvarium is intact. Imaged paranasal sinuses and mastoid air cells are clear. Carotid atherosclerosis is noted. IMPRESSION: No acute abnormality. Atherosclerosis. Electronically Signed   By: Inge Rise M.D.   On: 01/09/2016 10:06   Ct Head Wo Contrast  Result Date: 01/04/2016 CLINICAL DATA:  Responsive. Tremor. History of multiple myeloma, hypertension, hypercholesterolemia diabetes. EXAM: CT HEAD WITHOUT CONTRAST TECHNIQUE: Contiguous axial images were obtained from the base of the skull through the vertex without intravenous contrast. COMPARISON:  CT HEAD February 01, 2005 FINDINGS: Moderately motion degraded examination. INTRACRANIAL CONTENTS: The ventricles and sulci are normal for age. No  intraparenchymal hemorrhage, mass effect nor midline  shift. Patchy supratentorial white matter hypodensities are less than expected for patient's age and though non-specific likely represent chronic small vessel ischemic disease. No acute large vascular territory infarcts. No abnormal extra-axial fluid collections. Basal cisterns are patent. Moderate calcific atherosclerosis of the carotid siphons. ORBITS: The included ocular globes and orbital contents are non-suspicious. SINUSES: The mastoid aircells and included paranasal sinuses are well-aerated. SKULL/SOFT TISSUES: No skull fracture. No significant soft tissue swelling. IMPRESSION: Negative moderately motion degraded CT HEAD for age. Electronically Signed   By: Elon Alas M.D.   On: 01/04/2016 03:46   Mr Brain Wo Contrast  Result Date: 01/05/2016 CLINICAL DATA:  82 year old hypertensive female with brief episode of delirium from fever. End-stage renal disease on dialysis. Subsequent encounter. EXAM: MRI HEAD WITHOUT CONTRAST TECHNIQUE: Multiplanar, multiecho pulse sequences of the brain and surrounding structures were obtained without intravenous contrast. COMPARISON:  01/04/2016 head CT.  No comparison brain MR. FINDINGS: No acute infarct or intracranial hemorrhage. No MR evidence of herpes encephalitis. Mild chronic microvascular changes. Mild global atrophy without hydrocephalus. No intracranial mass lesion noted on this unenhanced exam. Post lens replacement without acute orbital abnormality. Major intracranial vascular structures are patent. Surgery upper cervical spine. Cervical medullary junction unremarkable. IMPRESSION: No acute intracranial abnormality.  Please see above. Electronically Signed   By: Genia Del M.D.   On: 01/05/2016 18:45   Ct Abdomen Pelvis W Contrast  Result Date: 01/25/2016 CLINICAL DATA:  82 year old female with lower abdominal pain EXAM: CT ABDOMEN AND PELVIS WITH CONTRAST TECHNIQUE: Multidetector CT imaging of the  abdomen and pelvis was performed using the standard protocol following bolus administration of intravenous contrast. CONTRAST:  1 ISOVUE-300 IOPAMIDOL (ISOVUE-300) INJECTION 61% COMPARISON:  CT dated 09/24/2012 FINDINGS: The visualized lung bases are clear. No intra-abdominal free air. Trace free fluid may be present within the pelvis. Cholecystectomy. Mild biliary ductal dilatation, likely post cholecystectomy. The pancreas is unremarkable. There is a 2 cm hypodense lesion in the spleen which is incompletely characterized but may represent a cyst or hemangioma. There is a 2 cm left adrenal high attenuating or enhancing lesion which is increased in size compared to the prior study. MRI without and with contrast is recommended for further characterization. There is severe bilateral renal atrophy. Small nonobstructing bilateral renal calculi measuring up to 4 mm in the upper pole of the left kidney. There is no hydronephrosis on either side. Bilateral renal upper pole exophytic hypodense lesions are not well characterized but likely represent cysts. The visualized ureters and urinary bladder appear unremarkable. Hysterectomy. There is extensive sigmoid diverticulosis with muscular hypertrophy. There is active inflammatory changes of the sigmoid colon compatible with acute diverticulitis. There is an ill-defined 2.8 x 4.5 cm complex collection with hazy organizing wall extending from the sigmoid colon anteriorly adjacent to the posterior wall of the urinary bladder compatible with diverticular abscess formation. There is no evidence of bowel obstruction. Normal appendix. There is moderate aortoiliac atherosclerotic disease. The origins of the celiac axis, SMA, IMA as well as the origins of the renal arteries are patent. No portal venous gas identified. There is no adenopathy. There is a midline vertical anterior pelvic wall incisional scar with a small fat containing umbilical hernia. The abdominal wall soft tissues  are otherwise unremarkable. There is degenerative changes of the spine with multilevel disc desiccation with vacuum phenomena. No acute fracture. IMPRESSION: Sigmoid diverticulitis with developing diverticular abscess. Enhancing left adrenal lesion. MRI without and with contrast is recommended for further characterization. Electronically Signed  By: Anner Crete M.D.   On: 01/17/2016 05:09   Ir US Guide Vasc Access Right  Result Date: 01/13/2016 INDICATION: Thrombosed AVG. EXAM: DIALYSIS GRAFT THROMBECTOMY, ULTRASOUND VENOUS ACCESS, PTA VENOUS MEDICATIONS: None. ANESTHESIA/SEDATION: Moderate Sedation Time:  40 The patient was continuously monitored during the procedure by the interventional radiology nurse under my direct supervision. Fentanyl 75 mcg.  Versus at 1.5 mg. Heparin 3000 units. FLUOROSCOPY TIME:  Fluoroscopy Time: 7 minutes  seconds (11 mGy). COMPLICATIONS: None immediate. PROCEDURE: Informed written consent was obtained from the patient after a thorough discussion of the procedural risks, benefits and alternatives. All questions were addressed. Maximal Sterile Barrier Technique was utilized including caps, mask, sterile gowns, sterile gloves, sterile drape, hand hygiene and skin antiseptic. A timeout was performed prior to the initiation of the procedure. The right arm was prepped with Betadine in a sterile fashion, and a sterile drape was applied covering the operative field. A sterile gown and sterile gloves were used for the procedure. The graft was noted to be occluded initially with ultrasound. Under sonographic guidance, a micropuncture needle was inserted into the graft (Ultrasound image documentation was performed) directed towards the venous anastomosis. It was removed over a 018 wire. A 5-French transitional dilator was inserted. 1 mg t-PA was injected. The dilator was exchanged over a Benson wire for a 6-French sheath. The identical procedure was performed towards the arterial  anastomosis. The Kumpe catheter was utilized to advance the Kumpe catheter across both the arterial and venous anastomoses. Central venography was performed. A 6 mm angioplasty device was utilized to dilate the venous limb of the graft, the venous anastomosis. The Fogarty balloon was then utilized to sweep the arterial and venous limbs of the graft. Final imaging was performed. Sheaths were removed and hemostasis was achieved with two figure-of-eight O Prolene stitches. IMPRESSION: Successful right arm AV graft thrombectomy and dilatation of the venous anastomosis to 6 mm. Electronically Signed   By: Marybelle Killings M.D.   On: 01/13/2016 16:49   Ir US Guide Vasc Access Right  Result Date: 01/11/2016 INDICATION: 82 year old female with a history of hyperkalemia and thrombosed dialysis graft. Plan is to place temporary hemodialysis catheter for hemodialysis session and treat the elevated potassium. Interval attempt at dialysis graft declot. EXAM: CENTRAL VENOUS CATHETER WITH FLUOROSCOPY; IR ULTRASOUND GUIDANCE VASC ACCESS RIGHT MEDICATIONS: None ANESTHESIA/SEDATION: None FLUOROSCOPY TIME:  Fluoroscopy Time: 0 minutes 12 seconds (0.7 mGy). COMPLICATIONS: None PROCEDURE: Informed written consent was obtained from the patient and the patient's family after a thorough discussion of the procedural risks, benefits and alternatives. All questions were addressed. A timeout was performed prior to the initiation of the procedure. The right neck and chest was prepped with chlorhexidine, and draped in the usual sterile fashion using maximum barrier technique (cap and mask, sterile gown, sterile gloves, large sterile sheet, hand hygiene and cutaneous antiseptic). Local anesthesia was attained by infiltration with 1% lidocaine without epinephrine. Ultrasound demonstrated patency of the right internal jugular vein, and this was documented with an image. Under real-time ultrasound guidance, this vein was accessed with a 21 gauge  micropuncture needle and image documentation was performed. A small dermatotomy was made at the access site with an 11 scalpel. A 0.018" wire was advanced into the SVC and the access needle exchanged for a 49F micropuncture vascular sheath. The 0.018" wire was then removed and a 0.035" wire advanced into the IVC. Upon withdrawal of the 018 wire, the wire was marked for appropriate length of the  internal portion of the catheter. A 19 cm catheter was selected. Skin and subcutaneous tissues were serially dilated. Catheter was placed on the wire. The catheter tip is positioned in the upper right atrium. This was documented with a spot image. Both ports of the hemodialysis catheter were then tested for excellent function. The ports were then locked with heparinized lock. Patient tolerated the procedure well and remained hemodynamically stable throughout. No complications were encountered and no significant blood loss was encountered. IMPRESSION: Status post right IJ temporary hemodialysis catheter. Catheter ready for use. Signed, Dulcy Fanny. Earleen Newport, DO [facility] Radiology Electronically Signed   By: Corrie Mckusick D.O.   On: 01/11/2016 17:45   Dg Fluoro Guide Cv Line-no Report  Result Date: 01-16-16 CLINICAL DATA:  FLOURO GUIDE CV LINE Fluoroscopy was utilized by the requesting physician.  No radiographic interpretation.   Ir Fluoro Guide Cv Midline Picc Right  Result Date: 01/11/2016 INDICATION: 82 year old female with a history of hyperkalemia and thrombosed dialysis graft. Plan is to place temporary hemodialysis catheter for hemodialysis session and treat the elevated potassium. Interval attempt at dialysis graft declot. EXAM: CENTRAL VENOUS CATHETER WITH FLUOROSCOPY; IR ULTRASOUND GUIDANCE VASC ACCESS RIGHT MEDICATIONS: None ANESTHESIA/SEDATION: None FLUOROSCOPY TIME:  Fluoroscopy Time: 0 minutes 12 seconds (0.7 mGy). COMPLICATIONS: None PROCEDURE: Informed  written consent was obtained from the patient and the patient's family after a thorough discussion of the procedural risks, benefits and alternatives. All questions were addressed. A timeout was performed prior to the initiation of the procedure. The right neck and chest was prepped with chlorhexidine, and draped in the usual sterile fashion using maximum barrier technique (cap and mask, sterile gown, sterile gloves, large sterile sheet, hand hygiene and cutaneous antiseptic). Local anesthesia was attained by infiltration with 1% lidocaine without epinephrine. Ultrasound demonstrated patency of the right internal jugular vein, and this was documented with an image. Under real-time ultrasound guidance, this vein was accessed with a 21 gauge micropuncture needle and image documentation was performed. A small dermatotomy was made at the access site with an 11 scalpel. A 0.018" wire was advanced into the SVC and the access needle exchanged for a 66F micropuncture vascular sheath. The 0.018" wire was then removed and a 0.035" wire advanced into the IVC. Upon withdrawal of the 018 wire, the wire was marked for appropriate length of the internal portion of the catheter. A 19 cm catheter was selected. Skin and subcutaneous tissues were serially dilated. Catheter was placed on the wire. The catheter tip is positioned in the upper right atrium. This was documented with a spot image. Both ports of the hemodialysis catheter were then tested for excellent function. The ports were then locked with heparinized lock. Patient tolerated the procedure well and remained hemodynamically stable throughout. No complications were encountered and no significant blood loss was encountered. IMPRESSION: Status post right IJ temporary hemodialysis catheter. Catheter ready for use. Signed, Dulcy Fanny. Earleen Newport, DO [facility] Radiology Electronically Signed   By: Corrie Mckusick D.O.   On: 01/11/2016  17:45   Ir Thrombectomy Av Fistula/w Thrombolysis/pta Inc Shunt/img Right  Result Date: 01/13/2016 INDICATION: Thrombosed AVG. EXAM: DIALYSIS GRAFT THROMBECTOMY, ULTRASOUND VENOUS ACCESS, PTA VENOUS MEDICATIONS: None. ANESTHESIA/SEDATION: Moderate Sedation Time:  40 The patient was continuously monitored during the procedure by the interventional radiology nurse under my direct supervision. Fentanyl 75 mcg.  Versus at 1.5 mg. Heparin 3000 units. FLUOROSCOPY TIME:  Fluoroscopy Time: 7 minutes  seconds (11  mGy). COMPLICATIONS: None immediate. PROCEDURE: Informed written consent was obtained from the patient after a thorough discussion of the procedural risks, benefits and alternatives. All questions were addressed. Maximal Sterile Barrier Technique was utilized including caps, mask, sterile gowns, sterile gloves, sterile drape, hand hygiene and skin antiseptic. A timeout was performed prior to the initiation of the procedure. The right arm was prepped with Betadine in a sterile fashion, and a sterile drape was applied covering the operative field. A sterile gown and sterile gloves were used for the procedure. The graft was noted to be occluded initially with ultrasound. Under sonographic guidance, a micropuncture needle was inserted into the graft (Ultrasound image documentation was performed) directed towards the venous anastomosis. It was removed over a 018 wire. A 5-French transitional dilator was inserted. 1 mg t-PA was injected. The dilator was exchanged over a Benson wire for a 6-French sheath. The identical procedure was performed towards the arterial anastomosis. The Kumpe catheter was utilized to advance the Kumpe catheter across both the arterial and venous anastomoses. Central venography was performed. A 6 mm angioplasty device was utilized to dilate the venous limb of the graft, the venous anastomosis. The Fogarty balloon was then utilized to sweep the arterial and venous limbs of the graft. Final  imaging was performed. Sheaths were removed and hemostasis was achieved with two figure-of-eight O Prolene stitches. IMPRESSION: Successful right arm AV graft thrombectomy and dilatation of the venous anastomosis to 6 mm. Electronically Signed   By: Marybelle Killings M.D.   On: 01/13/2016 16:49    Microbiology: Recent Results (from the past 240 hour(s))  Surgical pcr screen     Status: None   Collection Time: 01/13/16  2:54 AM  Result Value Ref Range Status   MRSA, PCR NEGATIVE NEGATIVE Final   Staphylococcus aureus NEGATIVE NEGATIVE Final    Comment:        The Xpert SA Assay (FDA approved for NASAL specimens in patients over 98 years of age), is one component of a comprehensive surveillance program.  Test performance has been validated by [facility] for patients greater than or equal to 60 year old. It is not intended to diagnose infection nor to guide or monitor treatment.      Labs: Basic Metabolic Panel:  Recent Labs Lab 01/09/16 0715 01/11/16 1310 01/12/16 0712 01/13/16 0544 01/14/16 0448 01/14/16 2000 02/03/2016 0500  NA 132* 131* 133* 135 134* 134* 134*  K 5.0 6.5* 5.1 4.0 4.0 3.7 3.9  CL 94* 97* 93* 94* 95* 99* 97*  CO2 22 19* '25 28 26 27 25  '$ GLUCOSE 102* 116* 150* 105* 84  --  121*  BUN 74* 114* 53* 24* 35*  --  16  CREATININE 9.30* 11.98* 7.24* 4.45* 6.34*  --  4.93*  CALCIUM 8.4* 8.1* 8.1* 8.5* 8.3*  --  8.5*  MG  --   --   --  2.1 2.2  --  2.1  PHOS 6.9*  --  7.0* 5.2* 5.8*  --  4.2   Liver Function Tests:  Recent Labs Lab 01/09/16 0715 01/12/16 0712 01/14/16 0448  ALBUMIN 2.3* 2.2* 2.4*   No results for input(s): LIPASE, AMYLASE in the last 168 hours. No results for input(s): AMMONIA in the last 168 hours. CBC:  Recent Labs Lab 01/11/16 1310 01/12/16 0712 01/13/16 0544 01/14/16 0448 01/14/16 2000 February 03, 2016 0500  WBC 12.2* 11.7* 10.9* 9.2 9.8 10.0  NEUTROABS 9.1* 9.4* 7.4 6.2  --  7.1  HGB 10.4* 9.2* 9.9*  9.1* 9.3* 9.3*  HCT 31.2* 28.0*  29.9* 28.2* 28.5* 29.4*  MCV 92.3 91.2 91.2 91.3 91.6 92.5  PLT 346 245 297 297 268 306   Cardiac Enzymes: No results for input(s): CKTOTAL, CKMB, CKMBINDEX, TROPONINI in the last 168 hours. D-Dimer No results for input(s): DDIMER in the last 72 hours. BNP: Invalid input(s): POCBNP CBG:  Recent Labs Lab 01/13/16 2150 01/14/16 1126 01/14/16 1718 01/14/16 2056 01/20/2016 0519  GLUCAP 124* 122* 135* 82 117*   Anemia work up No results for input(s): VITAMINB12, FOLATE, FERRITIN, TIBC, IRON, RETICCTPCT in the last 72 hours. Urinalysis    Component Value Date/Time   COLORURINE YELLOW 09/24/2012 1121   APPEARANCEUR CLEAR 09/24/2012 1121   LABSPEC 1.010 10/14/2013 0948   PHURINE 6.0 10/14/2013 0948   PHURINE 7.0 09/24/2012 1121   GLUCOSEU Negative 10/14/2013 0948   HGBUR Negative 10/14/2013 0948   HGBUR NEGATIVE 09/24/2012 1121   BILIRUBINUR Negative 10/14/2013 0948   KETONESUR Negative 10/14/2013 0948   KETONESUR NEGATIVE 09/24/2012 1121   PROTEINUR < 30 10/14/2013 0948   PROTEINUR NEGATIVE 09/24/2012 1121   UROBILINOGEN 0.2 10/14/2013 0948   NITRITE Negative 10/14/2013 0948   NITRITE NEGATIVE 09/24/2012 1121   LEUKOCYTESUR Moderate 10/14/2013 0948   Sepsis Labs Invalid input(s): PROCALCITONIN,  WBC,  LACTICIDVEN     SIGNED:  Elwin Mocha, MD  [facility] 2016/01/20, 3:04 PM Pager   If 7PM-7AM, please contact night-coverage [email or web address] Password [email or web address]

## 2016-02-03 NOTE — Op Note (Signed)
OPERATIVE NOTE  PROCEDURE: 1. US guided access left IJ vein 2. Central venogram  Assisted Dr. Servando Snare in pericardial window and placing bilateral chest tubes  PRE-OPERATIVE DIAGNOSIS: end-stage renal failure, thrombosed R arm av graft  POST-OPERATIVE DIAGNOSIS: same as above  SURGEON: Mirriam Vadala C. Donzetta Matters, MD  ANESTHESIA: MAC converted to general at time of ACLS protocol  ESTIMATED BLOOD LOSS: 1000cc  FINDING(S): 1. Perforation at svc left innominate vein junction with contrast extravasation into pericardium 2. Minimal fluid in pericardium at time of windo 3. 700cc blood from R chest, minimal from left at time of bilateral chest tube placement  SPECIMEN(S):  none  INDICATIONS:   82yo female who presents with diverticulitis and history of end-stage renal disease with thrombosed right upper extremity AV graft.  She has previous graft thrombectomy 2 days prior to this procedure by interventional radiology that was unsuccessful and she also had a placement of a right IJ non-tunneled catheter. She was therefore indicated for open graft thrombectomy basement of tunnel catheter from the left internal jugular prostate. We had specific conversations about the risks benefits and alternatives to the operation. Specifically we discussed the risk of bleeding infection blood vessel injury and suspending her DO NOT RESUSCITATE for the operation. That meant requiring if she chest compressions during the course of the operation we would perform them but she did not want to be on machines long term. Patient was agreeable to this and to proceed and was accompanied by her daughter at the time of consent  DESCRIPTION: After written full informed consent was obtained from the patient, the patient was taken back to the operating room.  She was previously on antibiotics since and no further antibiotics were given. She was sterilely prepped and draped in her left neck back anesthesia induced that  time alcohol. Using ultrasound guidance attempted to gain access to her left IJ vein. This ultimately required a micropuncture needle wire over which a micropuncture sheath was placed. Central venogram was performed demonstrating stenosis at the level of the left internal jugular as well as tortuosity and stenosis of the SVC and innominate veins. Because of this Glidewire was used the past tortuosity was placed into the pulmonary artery. At this time skin nick was made and subcutaneous tract dilated. We then used the peel-away sheath placed this over the Glidewire. At the level of the SVC and innominate junction this was noted to take an alternate course from her existing right IJ line. Given this, the new tunneled dialysis catheter was placed and the peel-away sheath was removed. Through the new catheter we then performed another Central venogram demonstrating what appeared to be extravasation into the pericardium. At this time I discussed with anesthesia systolic blood pressure was greater than 120. However on recheck at 1 minute her systolic pressure had dropped less than 90. I proceeded to tunnel the catheter and on recheck again the pressure now had dropped to systolic of 40. I then had the nursing staff contact the on-call cardiothoracic surgeon. Unfortunately the patient lost her pulse and ACLS protocol was initiated. At this time Dr. Servando Snare was cardiothoracic surgeons arrived as chest compressions were being performed. The chest was sterilely prepped and I assisted him in performing a pericardial window. We also placed bilateral chest tubes. The right chest tube had an output that was bloody of approximately 700 mL with minimal output from the left chest tube. Also the pericardial window revealed very minimal fluid. ACLS protocol was ongoing patient did require  multiple shocks for ventricular fibrillation. Anesthesia had also placed a TEE probe which demonstrated very weak ejection of the heart although at  times we did regain pulse that was palpable through the pericardial window site. At this time I broke scrub and had discussion with the daughter. Given our discussion of suspending the DO NOT RESUSCITATE for the operation that the patient would not want to be connected to tubes and monitors long-term we elected to suspend the ACLS protocol and allow the patient to expire after this occurred discussion was held with the medical examiner was the primary team and the patient tubes were removed and her pericardial window incision closed with staples with plan to transfer to PACU for visitation with the family.  COMPLICATIONS: perforation of left innominate vein at svc junction requiring ACLS protocol and ultimately patient expired on table after discussion with the daughter to pursue more aggressive intervention.  CONDITION: patient will be taken to PACU for family to visit. Discussion held with medical examiner and primary in hospital provider.   Kristia Jupiter C. Donzetta Matters, MD [facility] Office: [phone] Pager: [phone]

## 2016-02-03 NOTE — OR Nursing (Signed)
Patient coded after attempt to insert a left IJ dialysis Catheter, CPR initiated immediately and Dr Al Decant to do a Paricardial Window.  CPR continued throughout attempt of paricardial window.   Patient pronounced at 09:17.  Protocol followed and Tonye Becket of [facility] services called and refused body case [phone].

## 2016-02-03 NOTE — Procedures (Deleted)
OPERATIVE NOTE  PROCEDURE: 1. US guided access left IJ vein 2. Central venogram  Assisted Dr. Servando Snare in pericardial window and placing bilateral chest tubes  PRE-OPERATIVE DIAGNOSIS: end-stage renal failure, thrombosed R arm av graft  POST-OPERATIVE DIAGNOSIS: same as above  SURGEON: Liborio Saccente C. Donzetta Matters, MD  ANESTHESIA: MAC converted to general at time of ACLS protocol  ESTIMATED BLOOD LOSS: 1000cc  FINDING(S): 1. Perforation at svc left innominate vein junction with contrast extravasation into pericardium 2. Minimal fluid in pericardium at time of windo 3. 700cc blood from R chest, minimal from left at time of bilateral chest tube placement  SPECIMEN(S):  none  INDICATIONS:   82yo female who presents with diverticulitis and history of end-stage renal disease with thrombosed right upper extremity AV graft.  She has previous graft thrombectomy 2 days prior to this procedure by interventional radiology that was unsuccessful and she also had a placement of a right IJ non-tunneled catheter. She was therefore indicated for open graft thrombectomy basement of tunnel catheter from the left internal jugular prostate. We had specific conversations about the risks benefits and alternatives to the operation. Specifically we discussed the risk of bleeding infection blood vessel injury and suspending her DO NOT RESUSCITATE for the operation. That meant requiring if she chest compressions during the course of the operation we would perform them but she did not want to be on machines long term. Patient was agreeable to this and to proceed and was accompanied by her daughter at the time of consent  DESCRIPTION: After written full informed consent was obtained from the patient, the patient was taken back to the operating room.  She was previously on antibiotics since and no further antibiotics were given. She was sterilely prepped and draped in her left neck back anesthesia induced that time alcohol.  Using ultrasound guidance attempted to gain access to her left IJ vein. This ultimately required a micropuncture needle wire over which a micropuncture sheath was placed. Central venogram was performed demonstrating stenosis at the level of the left internal jugular as well as tortuosity and stenosis of the SVC and innominate veins. Because of this Glidewire was used the past tortuosity was placed into the pulmonary artery. At this time skin nick was made and subcutaneous tract dilated. We then used the peel-away sheath placed this over the Glidewire. At the level of the SVC and innominate junction this was noted to take an alternate course from her existing right IJ line. Given this, the new tunneled dialysis catheter was placed and the peel-away sheath was removed. Through the new catheter we then performed another Central venogram demonstrating what appeared to be extravasation into the pericardium. At this time I discussed with anesthesia systolic blood pressure was greater than 120. However on recheck at 1 minute her systolic pressure had dropped less than 90. I proceeded to tunnel the catheter and on recheck again the pressure now had dropped to systolic of 40. I then had the nursing staff contact the on-call cardiothoracic surgeon. Unfortunately the patient lost her pulse and ACLS protocol was initiated. At this time Dr. Servando Snare was cardiothoracic surgeons arrived as chest compressions were being performed. The chest was sterilely prepped and I assisted him in performing a pericardial window. We also placed bilateral chest tubes. The right chest tube had an output that was bloody of approximately 700 mL with minimal output from the left chest tube. Also the pericardial window revealed very minimal fluid. ACLS protocol was ongoing patient did require  multiple shocks for ventricular fibrillation. Anesthesia had also placed a TEE probe which demonstrated very weak ejection of the heart although at times we did  regain pulse that was palpable through the pericardial window site. At this time I broke scrub and had discussion with the daughter. Given our discussion of suspending the DO NOT RESUSCITATE for the operation that the patient would not want to be connected to tubes and monitors long-term we elected to suspend the ACLS protocol and allow the patient to expire after this occurred discussion was held with the medical examiner was the primary team and the patient tubes were removed and her pericardial window incision closed with staples with plan to transfer to PACU for visitation with the family.  COMPLICATIONS: perforation of left innominate vein at svc junction requiring ACLS protocol and ultimately patient expired on table after discussion with the daughter to pursue more aggressive intervention.  CONDITION: patient will be taken to PACU for family to visit. Discussion held with medical examiner and primary in hospital provider.   Kamla Skilton C. Donzetta Matters, MD [facility] [place] Office: [phone] Pager: [phone]

## 2016-02-03 NOTE — Progress Notes (Signed)
°   Jan 29, 2016 1245  Clinical Encounter Type  Visited With Family;Patient not available;Health care provider  Visit Type Spiritual support;Death  Referral From Nurse   Responded to page to come assist the patient's family as patient passed away during surgery. Nurse and Doctor spoke with patient's family. I offered support and prayer for family. Family members were coming and going throughout the morning. I listened to patients family tell me about the patient's 4 th birthday party and issues with dialysis. Patient was being moved to morgue and I spoke with family about what to expect next. Patient's daughter stated that [facility] would be handling the funeral. Patient had a large amount of visitors and it took hours for them to all visit her. Security officer was posted at door to assist with all the visitors. Patient still has several family members that are coming into town that may visit this evening in the morgue. Patients family will page if anything further is needed.  [place] on-call 01/29/16 2:19 PM

## 2016-02-03 DEATH — deceased

## 2016-02-09 ENCOUNTER — Other Ambulatory Visit: Payer: Medicare Other

## 2016-02-16 ENCOUNTER — Ambulatory Visit: Payer: Medicare Other | Admitting: Internal Medicine

## 2016-05-12 ENCOUNTER — Other Ambulatory Visit: Payer: Self-pay | Admitting: Nurse Practitioner
